# Patient Record
Sex: Male | Born: 1935 | Race: White | Hispanic: No | Marital: Married | Smoking: Former smoker
Health system: Southern US, Community
[De-identification: ages and names within clinical notes are randomized; demographics above are authoritative.]

## PROBLEM LIST (undated history)

## (undated) DIAGNOSIS — H9190 Unspecified hearing loss, unspecified ear: Secondary | ICD-10-CM

## (undated) DIAGNOSIS — C649 Malignant neoplasm of unspecified kidney, except renal pelvis: Secondary | ICD-10-CM

## (undated) DIAGNOSIS — I2699 Other pulmonary embolism without acute cor pulmonale: Secondary | ICD-10-CM

## (undated) DIAGNOSIS — K219 Gastro-esophageal reflux disease without esophagitis: Secondary | ICD-10-CM

## (undated) DIAGNOSIS — M1990 Unspecified osteoarthritis, unspecified site: Secondary | ICD-10-CM

## (undated) DIAGNOSIS — I700 Atherosclerosis of aorta: Secondary | ICD-10-CM

## (undated) DIAGNOSIS — I340 Nonrheumatic mitral (valve) insufficiency: Secondary | ICD-10-CM

## (undated) DIAGNOSIS — E785 Hyperlipidemia, unspecified: Secondary | ICD-10-CM

## (undated) DIAGNOSIS — R011 Cardiac murmur, unspecified: Secondary | ICD-10-CM

## (undated) DIAGNOSIS — J189 Pneumonia, unspecified organism: Secondary | ICD-10-CM

## (undated) DIAGNOSIS — R609 Edema, unspecified: Secondary | ICD-10-CM

## (undated) DIAGNOSIS — I10 Essential (primary) hypertension: Secondary | ICD-10-CM

## (undated) DIAGNOSIS — I4891 Unspecified atrial fibrillation: Secondary | ICD-10-CM

## (undated) DIAGNOSIS — I341 Nonrheumatic mitral (valve) prolapse: Secondary | ICD-10-CM

## (undated) DIAGNOSIS — Z974 Presence of external hearing-aid: Secondary | ICD-10-CM

## (undated) DIAGNOSIS — I38 Endocarditis, valve unspecified: Secondary | ICD-10-CM

## (undated) DIAGNOSIS — A419 Sepsis, unspecified organism: Secondary | ICD-10-CM

## (undated) DIAGNOSIS — D649 Anemia, unspecified: Secondary | ICD-10-CM

## (undated) DIAGNOSIS — I00 Rheumatic fever without heart involvement: Secondary | ICD-10-CM

## (undated) DIAGNOSIS — R600 Localized edema: Secondary | ICD-10-CM

## (undated) DIAGNOSIS — Z87442 Personal history of urinary calculi: Secondary | ICD-10-CM

## (undated) DIAGNOSIS — I509 Heart failure, unspecified: Secondary | ICD-10-CM

## (undated) HISTORY — PX: LITHOTRIPSY: SUR834

## (undated) HISTORY — DX: Other pulmonary embolism without acute cor pulmonale: I26.99

## (undated) HISTORY — PX: TEE WITH CARDIOVERSION: SHX5442

## (undated) HISTORY — DX: Cardiac murmur, unspecified: R01.1

## (undated) HISTORY — DX: Essential (primary) hypertension: I10

## (undated) HISTORY — PX: CARDIAC ELECTROPHYSIOLOGY MAPPING AND ABLATION: SHX1292

## (undated) HISTORY — PX: TONSILLECTOMY: SUR1361

---

## 2005-04-28 ENCOUNTER — Ambulatory Visit: Payer: Self-pay | Admitting: Unknown Physician Specialty

## 2006-12-07 ENCOUNTER — Ambulatory Visit: Payer: Self-pay | Admitting: Internal Medicine

## 2009-03-03 ENCOUNTER — Ambulatory Visit: Payer: Self-pay | Admitting: Cardiology

## 2009-03-03 ENCOUNTER — Ambulatory Visit: Payer: Self-pay | Admitting: Urology

## 2009-03-06 ENCOUNTER — Ambulatory Visit: Payer: Self-pay | Admitting: Urology

## 2011-02-25 ENCOUNTER — Ambulatory Visit: Payer: Self-pay | Admitting: Urology

## 2011-03-04 ENCOUNTER — Ambulatory Visit: Payer: Self-pay | Admitting: Urology

## 2016-04-22 ENCOUNTER — Emergency Department: Payer: Medicare Other

## 2016-04-22 ENCOUNTER — Encounter: Payer: Self-pay | Admitting: *Deleted

## 2016-04-22 ENCOUNTER — Inpatient Hospital Stay
Admission: EM | Admit: 2016-04-22 | Discharge: 2016-04-24 | DRG: 871 | Disposition: A | Discharge status: Other Acute Inpatient Hospital | Payer: Medicare Other | Attending: Internal Medicine | Admitting: Internal Medicine

## 2016-04-22 DIAGNOSIS — J690 Pneumonitis due to inhalation of food and vomit: Secondary | ICD-10-CM

## 2016-04-22 DIAGNOSIS — A419 Sepsis, unspecified organism: Secondary | ICD-10-CM | POA: Diagnosis not present

## 2016-04-22 DIAGNOSIS — R000 Tachycardia, unspecified: Secondary | ICD-10-CM | POA: Diagnosis present

## 2016-04-22 DIAGNOSIS — E785 Hyperlipidemia, unspecified: Secondary | ICD-10-CM | POA: Diagnosis present

## 2016-04-22 DIAGNOSIS — N4889 Other specified disorders of penis: Secondary | ICD-10-CM | POA: Diagnosis not present

## 2016-04-22 DIAGNOSIS — R7881 Bacteremia: Secondary | ICD-10-CM

## 2016-04-22 DIAGNOSIS — M25551 Pain in right hip: Secondary | ICD-10-CM | POA: Diagnosis present

## 2016-04-22 DIAGNOSIS — M4622 Osteomyelitis of vertebra, cervical region: Secondary | ICD-10-CM | POA: Diagnosis present

## 2016-04-22 DIAGNOSIS — M5431 Sciatica, right side: Secondary | ICD-10-CM | POA: Diagnosis present

## 2016-04-22 DIAGNOSIS — J189 Pneumonia, unspecified organism: Secondary | ICD-10-CM | POA: Diagnosis present

## 2016-04-22 DIAGNOSIS — L0291 Cutaneous abscess, unspecified: Secondary | ICD-10-CM | POA: Diagnosis present

## 2016-04-22 DIAGNOSIS — M436 Torticollis: Secondary | ICD-10-CM | POA: Diagnosis not present

## 2016-04-22 DIAGNOSIS — R51 Headache: Secondary | ICD-10-CM | POA: Diagnosis present

## 2016-04-22 DIAGNOSIS — Z880 Allergy status to penicillin: Secondary | ICD-10-CM | POA: Diagnosis not present

## 2016-04-22 DIAGNOSIS — B9561 Methicillin susceptible Staphylococcus aureus infection as the cause of diseases classified elsewhere: Secondary | ICD-10-CM | POA: Diagnosis present

## 2016-04-22 DIAGNOSIS — I340 Nonrheumatic mitral (valve) insufficiency: Secondary | ICD-10-CM | POA: Diagnosis present

## 2016-04-22 DIAGNOSIS — R509 Fever, unspecified: Secondary | ICD-10-CM

## 2016-04-22 DIAGNOSIS — G061 Intraspinal abscess and granuloma: Secondary | ICD-10-CM | POA: Diagnosis present

## 2016-04-22 DIAGNOSIS — M542 Cervicalgia: Secondary | ICD-10-CM

## 2016-04-22 HISTORY — DX: Hyperlipidemia, unspecified: E78.5

## 2016-04-22 LAB — CBC WITH DIFFERENTIAL/PLATELET
BASOS ABS: 0 10*3/uL (ref 0–0.1)
BASOS PCT: 0 %
Eosinophils Absolute: 0 10*3/uL (ref 0–0.7)
Eosinophils Relative: 0 %
HEMATOCRIT: 36.1 % — AB (ref 40.0–52.0)
HEMOGLOBIN: 12.5 g/dL — AB (ref 13.0–18.0)
Lymphocytes Relative: 6 %
Lymphs Abs: 1.1 10*3/uL (ref 1.0–3.6)
MCH: 33.1 pg (ref 26.0–34.0)
MCHC: 34.6 g/dL (ref 32.0–36.0)
MCV: 95.6 fL (ref 80.0–100.0)
Monocytes Absolute: 0.9 10*3/uL (ref 0.2–1.0)
Monocytes Relative: 5 %
NEUTROS ABS: 17.5 10*3/uL — AB (ref 1.4–6.5)
NEUTROS PCT: 89 %
Platelets: 186 10*3/uL (ref 150–440)
RBC: 3.77 MIL/uL — AB (ref 4.40–5.90)
RDW: 14.1 % (ref 11.5–14.5)
WBC: 19.5 10*3/uL — AB (ref 3.8–10.6)

## 2016-04-22 LAB — BASIC METABOLIC PANEL
ANION GAP: 6 (ref 5–15)
BUN: 26 mg/dL — ABNORMAL HIGH (ref 6–20)
CALCIUM: 8.9 mg/dL (ref 8.9–10.3)
CO2: 27 mmol/L (ref 22–32)
Chloride: 103 mmol/L (ref 101–111)
Creatinine, Ser: 0.87 mg/dL (ref 0.61–1.24)
Glucose, Bld: 147 mg/dL — ABNORMAL HIGH (ref 65–99)
POTASSIUM: 4.2 mmol/L (ref 3.5–5.1)
Sodium: 136 mmol/L (ref 135–145)

## 2016-04-22 LAB — PROCALCITONIN: Procalcitonin: 0.17 ng/mL

## 2016-04-22 LAB — GLUCOSE, CAPILLARY
GLUCOSE-CAPILLARY: 116 mg/dL — AB (ref 65–99)
Glucose-Capillary: 155 mg/dL — ABNORMAL HIGH (ref 65–99)

## 2016-04-22 LAB — LACTIC ACID, PLASMA
LACTIC ACID, VENOUS: 1.9 mmol/L (ref 0.5–1.9)
Lactic Acid, Venous: 1.1 mmol/L (ref 0.5–1.9)

## 2016-04-22 MED ORDER — PNEUMOCOCCAL VAC POLYVALENT 25 MCG/0.5ML IJ INJ: 0.5000 mL | INJECTION | INTRAMUSCULAR | Status: DC

## 2016-04-22 MED ORDER — PRAVASTATIN SODIUM 40 MG PO TABS
40.0000 mg | ORAL_TABLET | Freq: Every day | ORAL | Status: DC
  Administered 2016-04-22 – 2016-04-24 (×3): 40 mg via ORAL
  Filled 2016-04-22 (×3): qty 1

## 2016-04-22 MED ORDER — MORPHINE SULFATE (PF) 2 MG/ML IV SOLN
2.0000 mg | INTRAVENOUS | Status: DC | PRN
  Administered 2016-04-22 – 2016-04-24 (×5): 2 mg via INTRAVENOUS
  Filled 2016-04-22 (×5): qty 1

## 2016-04-22 MED ORDER — SODIUM CHLORIDE 0.9 % IV BOLUS (SEPSIS)
500.0000 mL | Freq: Once | INTRAVENOUS | Status: AC
  Administered 2016-04-22: 500 mL via INTRAVENOUS

## 2016-04-22 MED ORDER — CEFTRIAXONE SODIUM 1 G IJ SOLR
1.0000 g | Freq: Once | INTRAMUSCULAR | Status: DC
  Administered 2016-04-22: 1 g via INTRAVENOUS
  Filled 2016-04-22: qty 10

## 2016-04-22 MED ORDER — SODIUM CHLORIDE 0.9% FLUSH
3.0000 mL | Freq: Two times a day (BID) | INTRAVENOUS | Status: DC
  Administered 2016-04-22 – 2016-04-24 (×5): 3 mL via INTRAVENOUS

## 2016-04-22 MED ORDER — ACETAMINOPHEN 325 MG PO TABS: 650.0000 mg | ORAL_TABLET | Freq: Four times a day (QID) | ORAL | Status: DC | PRN

## 2016-04-22 MED ORDER — IPRATROPIUM-ALBUTEROL 0.5-2.5 (3) MG/3ML IN SOLN
3.0000 mL | RESPIRATORY_TRACT | Status: DC
  Administered 2016-04-22 – 2016-04-23 (×3): 3 mL via RESPIRATORY_TRACT
  Filled 2016-04-22 (×3): qty 3

## 2016-04-22 MED ORDER — HYDROMORPHONE HCL 1 MG/ML IJ SOLN
1.0000 mg | Freq: Once | INTRAMUSCULAR | Status: AC
  Administered 2016-04-22: 1 mg via INTRAVENOUS
  Filled 2016-04-22: qty 1

## 2016-04-22 MED ORDER — DEXTROSE 5 % IV SOLN
500.0000 mg | Freq: Once | INTRAVENOUS | Status: DC
  Filled 2016-04-22: qty 500

## 2016-04-22 MED ORDER — SODIUM CHLORIDE 0.9 % IV BOLUS (SEPSIS)
1000.0000 mL | Freq: Once | INTRAVENOUS | Status: AC
  Administered 2016-04-22: 1000 mL via INTRAVENOUS

## 2016-04-22 MED ORDER — ONDANSETRON HCL 4 MG PO TABS: 4.0000 mg | ORAL_TABLET | Freq: Four times a day (QID) | ORAL | Status: DC | PRN

## 2016-04-22 MED ORDER — INFLUENZA VAC SPLIT QUAD 0.5 ML IM SUSY: 0.5000 mL | PREFILLED_SYRINGE | INTRAMUSCULAR | Status: DC

## 2016-04-22 MED ORDER — ENOXAPARIN SODIUM 40 MG/0.4ML ~~LOC~~ SOLN
40.0000 mg | SUBCUTANEOUS | Status: DC
  Administered 2016-04-22 – 2016-04-24 (×3): 40 mg via SUBCUTANEOUS
  Filled 2016-04-22 (×3): qty 0.4

## 2016-04-22 MED ORDER — LORAZEPAM 2 MG/ML IJ SOLN
1.0000 mg | Freq: Once | INTRAMUSCULAR | Status: AC
  Administered 2016-04-22: 1 mg via INTRAVENOUS
  Filled 2016-04-22: qty 1

## 2016-04-22 MED ORDER — ONDANSETRON HCL 4 MG/2ML IJ SOLN: 4.0000 mg | Freq: Four times a day (QID) | INTRAMUSCULAR | Status: DC | PRN

## 2016-04-22 MED ORDER — LEVOFLOXACIN IN D5W 750 MG/150ML IV SOLN
750.0000 mg | INTRAVENOUS | Status: DC
  Administered 2016-04-22: 750 mg via INTRAVENOUS
  Filled 2016-04-22: qty 150

## 2016-04-22 MED ORDER — SODIUM CHLORIDE 0.9 % IV SOLN
INTRAVENOUS | Status: DC
  Administered 2016-04-23 – 2016-04-24 (×2): via INTRAVENOUS

## 2016-04-22 MED ORDER — ACETAMINOPHEN 650 MG RE SUPP: 650.0000 mg | Freq: Four times a day (QID) | RECTAL | Status: DC | PRN

## 2016-04-22 MED ORDER — OXYCODONE HCL 5 MG PO TABS
5.0000 mg | ORAL_TABLET | ORAL | Status: DC | PRN
  Administered 2016-04-22 – 2016-04-24 (×5): 5 mg via ORAL
  Filled 2016-04-22 (×5): qty 1

## 2016-04-22 MED ORDER — BUPIVACAINE HCL (PF) 0.5 % IJ SOLN
20.0000 mL | Freq: Once | INTRAMUSCULAR | Status: AC
  Administered 2016-04-22: 20 mL
  Filled 2016-04-22: qty 30

## 2016-04-22 NOTE — ED Notes (Signed)
Patient returned from [place]. Placed back on monitor. Will continue to monitor. Gave hearing aid to son.

## 2016-04-22 NOTE — Progress Notes (Signed)
Pt AAOx4 pt has no c/o pain pt stable and being transferred to 2A for continued Care.Report given to [facility] ,RN

## 2016-04-22 NOTE — ED Provider Notes (Signed)
Time Seen: Approximately 0928  I have reviewed the triage notes  Chief Complaint: Neck Pain   History of Present Illness: Marcus Kemp is a 82 y.o. male *who presents with a 5-6 day history of left-sided neck pain. Patient states that he's had difficulty with sleep and moving his head in either direction. Patient's taken some occasional Motrin at home without significant relief. The patient's not aware of any fever, chills, significant headache. He points to a single spot basically at the left occiput region as the source of swelling and focal pain. He denies any rash or any history of trauma other than he was moving a bucket last week and feels like he "" pulled his neck. He states he's also had some right hip pain from a twisting mechanism but states no direct trauma or fall and has been ambulatory without difficulty.   Past Medical History:  Diagnosis Date  . Hyperlipidemia     Patient Active Problem List   Diagnosis Date Noted  . Sepsis ([place]) 04/22/2016  . Community acquired pneumonia 04/22/2016    History reviewed. No pertinent surgical history.  History reviewed. No pertinent surgical history.    Allergies:  Penicillins  Family History: Family History  Problem Relation Age of Onset  . Hypertension Other     Social History: Social History  Substance Use Topics  . Smoking status: Never Smoker  . Smokeless tobacco: Never Used  . Alcohol use No     Review of Systems:   10 point review of systems was performed and was otherwise negative:  Constitutional: No fever Eyes: No visual disturbances ENT: No sore throat, ear pain Cardiac: No chest pain Respiratory: No shortness of breath, wheezing, or stridor Abdomen: No abdominal pain, no vomiting, No diarrhea Endocrine: No weight loss, No night sweats Extremities: No peripheral edema, cyanosis Skin: No rashes, easy bruising Neurologic: No focal weakness, trouble with speech or swollowing Urologic: No  dysuria, Hematuria, or urinary frequency   Physical Exam:  ED Triage Vitals  Enc Vitals Group     BP 04/22/16 1030 (!) 83/64     Pulse Rate 04/22/16 0902 (!) 131     Resp 04/22/16 0902 16     Temp 04/22/16 0902 97.7 F (36.5 C)     Temp Source 04/22/16 0902 Oral     SpO2 04/22/16 0902 98 %     Weight 04/22/16 0902 150 lb (68 kg)     Height 04/22/16 0902 5\' 6"  (1.676 m)     Head Circumference --      Peak Flow --      Pain Score 04/22/16 0902 6     Pain Loc --      Pain Edu? --      Excl. in [place]? --     General: Awake , Alert , and Oriented times 3; GCS 15 Head: Normal cephalic , atraumatic Eyes: Pupils equal , round, reactive to light Nose/Throat: No nasal drainage, patent upper airway without erythema or exudate.  Neck: Patient has point tenderness basically at the occiput region with limited range of motion secondary to pain. No obvious meningeal signs and no palpable abscesses.  Lungs: Clear to ascultation without wheezes , rhonchi, or rales Heart: Tachycardia, regular rhythm without murmurs , gallops , or rubs Abdomen: Soft, non tender without rebound, guarding , or rigidity; bowel sounds positive and symmetric in all 4 quadrants. No organomegaly .        Extremities: 2 plus symmetric pulses. No  edema, clubbing or cyanosis Neurologic: normal ambulation, Motor symmetric without deficits, sensory intact Skin: warm, dry, no rashes   Labs:   All laboratory work was reviewed including any pertinent negatives or positives listed below:  Labs Reviewed  BASIC METABOLIC PANEL - Abnormal; Notable for the following:       Result Value   Glucose, Bld 147 (*)    BUN 26 (*)    All other components within normal limits  CBC WITH DIFFERENTIAL/PLATELET - Abnormal; Notable for the following:    WBC 19.5 (*)    RBC 3.77 (*)    Hemoglobin 12.5 (*)    HCT 36.1 (*)    Neutro Abs 17.5 (*)    All other components within normal limits  CULTURE, BLOOD (ROUTINE X 2)  CULTURE, BLOOD  (ROUTINE X 2)  LACTIC ACID, PLASMA  LACTIC ACID, PLASMA  URINALYSIS COMPLETEWITH MICROSCOPIC ([facility] ONLY)  CBC  CREATININE, SERUM  PROCALCITONIN  Of significance is the patient's elevated white blood cell count  EKG:  ED ECG REPORT I, Daymon Larsen, the attending physician, personally viewed and interpreted this ECG.  Date: 04/22/2016 EKG Time: 0859 Rate:130 Rhythm: Sinus tachycardia without any acute ischemic changes QRS Axis: normal Intervals: normal ST/T Wave abnormalities: normal Conduction Disturbances: none Narrative Interpretation: unremarkable    Radiology:  "Dg Chest 2 View  Result Date: 04/22/2016 CLINICAL DATA:  Injured lifting last week now with neck pain EXAM: CHEST  2 VIEW COMPARISON:  None. FINDINGS: The lungs are not optimally aerated. There is minimal patchy opacity at the left lung base which could be posteriorly positioned suspicious for pneumonia. Some of the opacity on the lateral view is most likely due to degenerative spurring in the lower thoracic spine. No effusion is seen. The heart is mildly enlarged. The bones are osteopenic and there are diffuse degenerative changes throughout the thoracic spine. IMPRESSION: 1. Cannot exclude a small patchy area of pneumonia within the left lower lobe. 2. Poor inspiration. Electronically Signed   By: Ivar Drape M.D.   On: 04/22/2016 14:42   Ct Cervical Spine Wo Contrast  Result Date: 04/22/2016 CLINICAL DATA:  Neck pain after moving a bucket last week EXAM: CT CERVICAL SPINE WITHOUT CONTRAST TECHNIQUE: Multidetector CT imaging of the cervical spine was performed without intravenous contrast. Multiplanar CT image reconstructions were also generated. COMPARISON:  None. FINDINGS: Alignment: Sagittal images shows normal alignment. Skull base and vertebrae: No acute fracture or subluxation. There are degenerative changes C1-C2 articulation. There is mild posterior spurring at C5-C6 level. Mild anterior spurring at C5-C6 and  C6-C7 level. Moderate posterior spurring at C6-C7 level. Minimal anterior spurring at C3-C4 level. Soft tissues and spinal canal: No prevertebral soft tissue swelling. There is mild spinal canal stenosis at C5-C6 level due to posterior spurring. Moderate stenosis spinal canal left lateral recess due to posterior spurring at C6-C7 level. Disc levels: There is mild disc space flattening at C3-C4 level. Mild to moderate disc space flattening at C5-C6 level. Significant disc space flattening with vacuum disc phenomenon at C6-C7 level. Upper chest: There is mild thickening of upper esophageal wall. Gastroesophageal reflux cannot be excluded. There is no pneumothorax in visualized lung apices. Other: There is small upper pneumomediastinum. This may be posttraumatic in nature. Clinical correlation necessary. Please note there is soft tissue air in soft tissue neck just lateral to right TMJ and lateral to right sphenoid. IMPRESSION: 1. No acute fracture or subluxation. Degenerative changes multilevel as described above. Most significant at C5-C6  and C6-C7 level. No prevertebral soft tissue swelling. Cervical airway is patent. There is neck soft tissue air on the right side just lateral to mandible and lateral to right sphenoid. Please see axial image 6. 2. Small upper pneumomediastinum. This may be post traumatic in nature. Clinical correlation is necessary. 3. Mild thickening of upper esophageal wall. Gastroesophageal reflux cannot be excluded. Clinical correlation is necessary. Electronically Signed   By: Lahoma Crocker M.D.   On: 04/22/2016 10:38  "   I personally reviewed the radiologic studies    ED Course:  Patient's stay here showed his pain is still be reproducible left-sided neck and I felt this was more of a torticollis 10 so much meningitis or cervical discitis. Patient had injection of bupivacaine locally to see if that offers relief in the area of swelling and point tenderness. He does state significant  relief and was also given some IV fluids. The patient remained tachycardic and had episodes of low blood pressure and I cannot ascertain the exact source. His white blood cell count came back with his laboratory work to be elevated. Initial and repeat temperature check show him to be afebrile. The patient was initiated on a septic workup with chest x-ray, lactic acid, etc. Blood cultures 2 are pending. Patient was found on chest x-ray to have some left lower lobe pneumonia and was initiated on IV antibiotics here in emergency department. Her pressure has come up with fluids is still persistently tachycardic with a heart rate of 124. Clinical Course     Assessment:  Torticollis Community-acquired pneumonia      Plan:  Inpatient management            Daymon Larsen, MD 04/22/16 [phone]

## 2016-04-22 NOTE — H&P (Addendum)
[place] [facility] [place] NAME: Marcus Kemp    MR#:  [id number]  DATE OF BIRTH:  01-Jul-1925   DATE OF ADMISSION:  04/22/2016  PRIMARY CARE PHYSICIAN: No primary care provider on file.   REQUESTING/REFERRING PHYSICIAN: Marcelene Butte  CHIEF COMPLAINT:   Chief Complaint  Patient presents with  . Neck Pain    HISTORY OF PRESENT ILLNESS:  Marcus Kemp  is a 82 y.o. male with a known history of Hyperlipidemia unspecified, mitral valve prolapse who is presenting with neck pain.  6 day duration of neck pain after picking up a heavy box denies any numbness or tingling. He describes the pain 6-7/10 no worsening or relieving factors nonradiating. He also describes having cough nonproductive for the same general duration denies fevers chills shortness of breath. Upon presentation the hospital notably tachycardic, actually meeting septic criteria workup reveals possible pneumonia.  PAST MEDICAL HISTORY:   Past Medical History:  Diagnosis Date  . Hyperlipidemia     PAST SURGICAL HISTORY:  History reviewed. No pertinent surgical history.  SOCIAL HISTORY:   Social History  Substance Use Topics  . Smoking status: Never Smoker  . Smokeless tobacco: Never Used  . Alcohol use No    FAMILY HISTORY:   Family History  Problem Relation Age of Onset  . Hypertension Other     DRUG ALLERGIES:   Allergies  Allergen Reactions  . Penicillins Anaphylaxis    REVIEW OF SYSTEMS:  REVIEW OF SYSTEMS:  CONSTITUTIONAL: Denies fevers, chills, fatigue, weakness.  EYES: Denies blurred vision, double vision, or eye pain.  EARS, NOSE, THROAT: Denies tinnitus, ear pain, hearing loss.  RESPIRATORY: Positive cough, denies shortness of breath, wheezing  CARDIOVASCULAR: Denies chest pain, palpitations, edema.  GASTROINTESTINAL: Denies nausea, vomiting, diarrhea, abdominal pain.  GENITOURINARY: Denies dysuria, hematuria.  ENDOCRINE: Denies nocturia or thyroid  problems. HEMATOLOGIC AND LYMPHATIC: Denies easy bruising or bleeding.  SKIN: Denies rash or lesions.  MUSCULOSKELETAL: Positive pain in neck, denies back, shoulder, knees, hips, or further arthritic symptoms.  NEUROLOGIC: Denies paralysis, paresthesias.  PSYCHIATRIC: Denies anxiety or depressive symptoms. Otherwise full review of systems performed by me is negative.   MEDICATIONS AT HOME:   Prior to Admission medications   Not on File      VITAL SIGNS:  Blood pressure (!) 87/69, pulse (!) 126, temperature 97.6 F (36.4 C), temperature source Oral, resp. rate 11, height 5\' 6"  (1.676 m), weight 68 kg (150 lb), SpO2 96 %.  PHYSICAL EXAMINATION:  VITAL SIGNS: Vitals:   04/22/16 1304 04/22/16 1330  BP:  (!) 87/69  Pulse:  (!) 126  Resp:  11  Temp: 97.6 F (36.4 C)    GENERAL:82 y.o.male currently in no acute distress. Hard of hearing HEAD: Normocephalic, atraumatic.  EYES: Pupils equal, round, reactive to light. Extraocular muscles intact. No scleral icterus.  MOUTH: Moist mucosal membrane. Dentition intact. No abscess noted.  EAR, NOSE, THROAT: Clear without exudates. No external lesions.  NECK: Supple. No thyromegaly. No nodules. No JVD.  PULMONARY: Rhonchi left base without wheezei. No use of accessory muscles, Good respiratory effort. good air entry bilaterally CHEST: Nontender to palpation.  CARDIOVASCULAR: S1 and S2. Tachycardic 3/6 murmurs, rubs, or gallops. No edema. Pedal pulses 2+ bilaterally.  GASTROINTESTINAL: Soft, nontender, nondistended. No masses. Positive bowel sounds. No hepatosplenomegaly.  MUSCULOSKELETAL: No swelling, clubbing, or edema. Range of motion full in all extremities.  NEUROLOGIC: Cranial nerves II through XII are intact. No gross focal neurological deficits.  Sensation intact. Reflexes intact.  SKIN: No ulceration, lesions, rashes, or cyanosis. Skin warm and dry. Turgor intact.  PSYCHIATRIC: Mood, affect within normal limits. The patient is awake,  alert and oriented x 3. Insight, judgment intact.    LABORATORY PANEL:   CBC  Recent Labs Lab 04/22/16 0925  WBC 19.5*  HGB 12.5*  HCT 36.1*  PLT 186   ------------------------------------------------------------------------------------------------------------------  Chemistries   Recent Labs Lab 04/22/16 0925  NA 136  K 4.2  CL 103  CO2 27  GLUCOSE 147*  BUN 26*  CREATININE 0.87  CALCIUM 8.9   ------------------------------------------------------------------------------------------------------------------  Cardiac Enzymes No results for input(s): TROPONINI in the last 168 hours. ------------------------------------------------------------------------------------------------------------------  RADIOLOGY:  Dg Chest 2 View  Result Date: 04/22/2016 CLINICAL DATA:  Injured lifting last week now with neck pain EXAM: CHEST  2 VIEW COMPARISON:  None. FINDINGS: The lungs are not optimally aerated. There is minimal patchy opacity at the left lung base which could be posteriorly positioned suspicious for pneumonia. Some of the opacity on the lateral view is most likely due to degenerative spurring in the lower thoracic spine. No effusion is seen. The heart is mildly enlarged. The bones are osteopenic and there are diffuse degenerative changes throughout the thoracic spine. IMPRESSION: 1. Cannot exclude a small patchy area of pneumonia within the left lower lobe. 2. Poor inspiration. Electronically Signed   By: Ivar Drape M.D.   On: 04/22/2016 14:42   Ct Cervical Spine Wo Contrast  Result Date: 04/22/2016 CLINICAL DATA:  Neck pain after moving a bucket last week EXAM: CT CERVICAL SPINE WITHOUT CONTRAST TECHNIQUE: Multidetector CT imaging of the cervical spine was performed without intravenous contrast. Multiplanar CT image reconstructions were also generated. COMPARISON:  None. FINDINGS: Alignment: Sagittal images shows normal alignment. Skull base and vertebrae: No acute fracture  or subluxation. There are degenerative changes C1-C2 articulation. There is mild posterior spurring at C5-C6 level. Mild anterior spurring at C5-C6 and C6-C7 level. Moderate posterior spurring at C6-C7 level. Minimal anterior spurring at C3-C4 level. Soft tissues and spinal canal: No prevertebral soft tissue swelling. There is mild spinal canal stenosis at C5-C6 level due to posterior spurring. Moderate stenosis spinal canal left lateral recess due to posterior spurring at C6-C7 level. Disc levels: There is mild disc space flattening at C3-C4 level. Mild to moderate disc space flattening at C5-C6 level. Significant disc space flattening with vacuum disc phenomenon at C6-C7 level. Upper chest: There is mild thickening of upper esophageal wall. Gastroesophageal reflux cannot be excluded. There is no pneumothorax in visualized lung apices. Other: There is small upper pneumomediastinum. This may be posttraumatic in nature. Clinical correlation necessary. Please note there is soft tissue air in soft tissue neck just lateral to right TMJ and lateral to right sphenoid. IMPRESSION: 1. No acute fracture or subluxation. Degenerative changes multilevel as described above. Most significant at C5-C6 and C6-C7 level. No prevertebral soft tissue swelling. Cervical airway is patent. There is neck soft tissue air on the right side just lateral to mandible and lateral to right sphenoid. Please see axial image 6. 2. Small upper pneumomediastinum. This may be post traumatic in nature. Clinical correlation is necessary. 3. Mild thickening of upper esophageal wall. Gastroesophageal reflux cannot be excluded. Clinical correlation is necessary. Electronically Signed   By: Lahoma Crocker M.D.   On: 04/22/2016 10:38    EKG:   Orders placed or performed during the hospital encounter of 04/22/16  . EKG 12-Lead  . EKG 12-Lead  IMPRESSION AND PLAN:   82 year old Caucasian history of hyperlipidemia unspecified presenting with neck pain  meeting septic criteria  1.Sepsis, meeting septic criteria by tachycardia, leukocytosis present on arrival. Source may require pneumonia Code sepsis initiated. Panculture. Broad-spectrum antibiotics including Levaquin and taper antibiotics when culture data returns.  Ordered 30 mL/kg IV fluid bolus. Continue IV fluid hydration to keep mean arterial pressure greater than 65. may require pressor therapy if blood pressure worsens. We will repeat lactic acid if the initial is greater than 2.2. , Provide DuoNeb treatments, supplemental oxygen as required to keep O2 saturation greater than 92% Given blood pressure being marginal admit to stepdown unit  2. Cervalgia: Supportive care, no acute findings on imaging, can add Flexeril if no improvement 3. Hyper Lipidemia unspecified: Statin therapy 4. Venous thrombus embolism prophylactic: Lovenox      All the records are reviewed and case discussed with ED provider. Management plans discussed with the patient, family and they are in agreement.  CODE STATUS: Full  TOTAL TIME TAKING CARE OF THIS PATIENT: 33 critical minutes.    Briunna Leicht,  Karenann Cai.D on 04/22/2016 at 3:26 PM  Between 7am to 6pm - Pager - [phone]  After 6pm: House Pager: [phone]  [facility]  Office  [phone]  CC: Primary care physician; No primary care provider on file.

## 2016-04-22 NOTE — ED Triage Notes (Signed)
Pt reports moving a bucket last week, pulled neck, pt complains of neck pain with a lump, pt is unable to sleep or lay down

## 2016-04-22 NOTE — ED Notes (Signed)
Patient's son Merry Proud is leaving for about an hour. Number to reach him is [phone].

## 2016-04-22 NOTE — Progress Notes (Signed)
Pharmacy Antibiotic Note  Marcus Kemp is a 82 y.o. male admitted on 04/22/2016 with pneumonia.  Pharmacy has been consulted for levofloxacin dosing.  Patient received a dose of ceftriaxone in the ED. Per discussion with MD - antibiotics will be changed from azithromycin/ceftriaxone to levofloxacin due to penicillin allergy.  Plan: Levofloxacin 750 mg IV q24h  Height: 5\' 6"  (167.6 cm) Weight: 150 lb (68 kg) IBW/kg (Calculated) : 63.8  Temp (24hrs), Avg:97.7 F (36.5 C), Min:97.6 F (36.4 C), Max:97.7 F (36.5 C)   Recent Labs Lab 04/22/16 0925 04/22/16 1413  WBC 19.5*  --   CREATININE 0.87  --   LATICACIDVEN  --  1.1    Estimated Creatinine Clearance: 53 mL/min (by C-G formula based on SCr of 0.87 mg/dL).    Allergies  Allergen Reactions  . Penicillins Anaphylaxis   Antimicrobials this admission: levofloxacin 9/28 >>  Ceftriaxone one time dose in ED 9/28  Dose adjustments this admission:  Microbiology results: 9/28 BCx: Sent  Thank you for allowing pharmacy to be a part of this patient's care.  Lenis Noon, PharmD, BCPS Clinical Pharmacist 04/22/2016 3:41 PM

## 2016-04-23 ENCOUNTER — Inpatient Hospital Stay: Admit: 2016-04-23 | Payer: Medicare Other

## 2016-04-23 ENCOUNTER — Inpatient Hospital Stay
Admit: 2016-04-23 | Discharge: 2016-04-23 | Disposition: A | Discharge status: Home / Self Care | Payer: Medicare Other | Attending: Specialist | Admitting: Specialist

## 2016-04-23 LAB — CBC
HEMATOCRIT: 34.5 % — AB (ref 40.0–52.0)
HEMOGLOBIN: 11.6 g/dL — AB (ref 13.0–18.0)
MCH: 32.9 pg (ref 26.0–34.0)
MCHC: 33.7 g/dL (ref 32.0–36.0)
MCV: 97.7 fL (ref 80.0–100.0)
Platelets: 194 10*3/uL (ref 150–440)
RBC: 3.53 MIL/uL — ABNORMAL LOW (ref 4.40–5.90)
RDW: 14.1 % (ref 11.5–14.5)
WBC: 18.3 10*3/uL — ABNORMAL HIGH (ref 3.8–10.6)

## 2016-04-23 LAB — BASIC METABOLIC PANEL
ANION GAP: 6 (ref 5–15)
BUN: 27 mg/dL — ABNORMAL HIGH (ref 6–20)
CHLORIDE: 104 mmol/L (ref 101–111)
CO2: 26 mmol/L (ref 22–32)
Calcium: 8.5 mg/dL — ABNORMAL LOW (ref 8.9–10.3)
Creatinine, Ser: 0.8 mg/dL (ref 0.61–1.24)
GFR calc Af Amer: >60 mL/min (ref >60)
GFR calc non Af Amer: >60 mL/min (ref >60)
GLUCOSE: 137 mg/dL — AB (ref 65–99)
POTASSIUM: 4.3 mmol/L (ref 3.5–5.1)
Sodium: 136 mmol/L (ref 135–145)

## 2016-04-23 LAB — BLOOD CULTURE ID PANEL (REFLEXED)
Acinetobacter baumannii: NOT DETECTED
CANDIDA GLABRATA: NOT DETECTED
CANDIDA TROPICALIS: NOT DETECTED
Candida albicans: NOT DETECTED
Candida krusei: NOT DETECTED
Candida parapsilosis: NOT DETECTED
ENTEROBACTER CLOACAE COMPLEX: NOT DETECTED
Enterobacteriaceae species: NOT DETECTED
Enterococcus species: NOT DETECTED
Escherichia coli: NOT DETECTED
HAEMOPHILUS INFLUENZAE: NOT DETECTED
KLEBSIELLA PNEUMONIAE: NOT DETECTED
Klebsiella oxytoca: NOT DETECTED
Listeria monocytogenes: NOT DETECTED
Methicillin resistance: NOT DETECTED
NEISSERIA MENINGITIDIS: NOT DETECTED
PROTEUS SPECIES: NOT DETECTED
Pseudomonas aeruginosa: NOT DETECTED
STAPHYLOCOCCUS SPECIES: DETECTED — AB
STREPTOCOCCUS SPECIES: NOT DETECTED
Serratia marcescens: NOT DETECTED
Staphylococcus aureus (BCID): DETECTED — AB
Streptococcus agalactiae: NOT DETECTED
Streptococcus pneumoniae: NOT DETECTED
Streptococcus pyogenes: NOT DETECTED

## 2016-04-23 LAB — SEDIMENTATION RATE: SED RATE: 65 mm/h — AB (ref 0–20)

## 2016-04-23 LAB — ECHOCARDIOGRAM COMPLETE
Height: 66 in
WEIGHTICAEL: 2624 [oz_av]

## 2016-04-23 LAB — C-REACTIVE PROTEIN: CRP: 19 mg/dL — AB (ref <1.0)

## 2016-04-23 MED ORDER — SODIUM CHLORIDE 0.9 % IV SOLN
Freq: Once | INTRAVENOUS | Status: AC
  Administered 2016-04-23: 23:00:00 via INTRAVENOUS

## 2016-04-23 MED ORDER — VANCOMYCIN HCL 10 G IV SOLR
1500.0000 mg | Freq: Once | INTRAVENOUS | Status: AC
  Administered 2016-04-23: 1500 mg via INTRAVENOUS
  Filled 2016-04-23 (×2): qty 1500

## 2016-04-23 MED ORDER — IPRATROPIUM-ALBUTEROL 0.5-2.5 (3) MG/3ML IN SOLN: 3.0000 mL | RESPIRATORY_TRACT | Status: DC | PRN

## 2016-04-23 MED ORDER — MENTHOL 3 MG MT LOZG
1.0000 | LOZENGE | OROMUCOSAL | Status: DC | PRN
  Administered 2016-04-23 (×2): 3 mg via ORAL
  Filled 2016-04-23: qty 9

## 2016-04-23 MED ORDER — LEVOFLOXACIN IN D5W 750 MG/150ML IV SOLN
750.0000 mg | INTRAVENOUS | Status: DC
  Administered 2016-04-23 – 2016-04-24 (×2): 750 mg via INTRAVENOUS
  Filled 2016-04-23 (×2): qty 150

## 2016-04-23 MED ORDER — CEFAZOLIN SODIUM-DEXTROSE 2-4 GM/100ML-% IV SOLN
2.0000 g | Freq: Three times a day (TID) | INTRAVENOUS | Status: DC
  Administered 2016-04-23 – 2016-04-24 (×5): 2 g via INTRAVENOUS
  Filled 2016-04-23 (×9): qty 100

## 2016-04-23 MED ORDER — CYCLOBENZAPRINE HCL 10 MG PO TABS
10.0000 mg | ORAL_TABLET | Freq: Three times a day (TID) | ORAL | Status: DC | PRN
  Administered 2016-04-23 – 2016-04-24 (×4): 10 mg via ORAL
  Filled 2016-04-23 (×4): qty 1

## 2016-04-23 MED ORDER — KETOROLAC TROMETHAMINE 30 MG/ML IJ SOLN
30.0000 mg | Freq: Once | INTRAMUSCULAR | Status: AC
  Administered 2016-04-23: 30 mg via INTRAVENOUS
  Filled 2016-04-23: qty 1

## 2016-04-23 MED ORDER — BENZONATATE 100 MG PO CAPS
200.0000 mg | ORAL_CAPSULE | Freq: Three times a day (TID) | ORAL | Status: DC | PRN
  Administered 2016-04-23 (×2): 200 mg via ORAL
  Filled 2016-04-23 (×2): qty 2

## 2016-04-23 NOTE — Progress Notes (Signed)
[facility] NAME: Marcus Kemp    MR#:  [id number]  DATE OF BIRTH:  [birth year]  SUBJECTIVE:   Pt. Here due to neck pain and also complaining of right Sided sciatica pain. Noted to be septic and blood cultures positive for staph aureus. Family at bedside. No cough, chest pain, fever overnight.  REVIEW OF SYSTEMS:    Review of Systems  Constitutional: Negative for chills and fever.  HENT: Negative for congestion and tinnitus.   Eyes: Negative for blurred vision and double vision.  Respiratory: Negative for cough, shortness of breath and wheezing.   Cardiovascular: Negative for chest pain, orthopnea and PND.  Gastrointestinal: Negative for abdominal pain, diarrhea, nausea and vomiting.  Genitourinary: Negative for dysuria and hematuria.  Musculoskeletal: Positive for neck pain.  Neurological: Positive for weakness. Negative for dizziness, sensory change and focal weakness.  All other systems reviewed and are negative.   Nutrition: Heart Healthy Tolerating Diet: Yes Tolerating PT: Await Eval.     DRUG ALLERGIES:   Allergies  Allergen Reactions  . Penicillins Anaphylaxis    VITALS:  Blood pressure 116/81, pulse (!) 126, temperature 98.3 F (36.8 C), temperature source Oral, resp. rate 19, height _0  (1.676 m), weight 74.4 kg (164 lb), SpO2 100 %.  PHYSICAL EXAMINATION:   Physical Exam  GENERAL:  82 y.o.-year-old patient lying in the bed in some distress due to neck pain.  EYES: Pupils equal, round, reactive to light and accommodation. No scleral icterus. Extraocular muscles intact.  HEENT: Head atraumatic, normocephalic. Oropharynx and nasopharynx clear.  NECK:  Supple, no jugular venous distention. No thyroid enlargement, no tenderness.  LUNGS: Normal breath sounds bilaterally, no wheezing, rales, rhonchi. No use of accessory muscles of respiration.  CARDIOVASCULAR: S1, S2 normal. No murmurs, rubs, or gallops.   ABDOMEN: Soft, nontender, nondistended. Bowel sounds present. No organomegaly or mass.  EXTREMITIES: No cyanosis, clubbing or edema b/l.    NEUROLOGIC: Cranial nerves II through XII are intact. No focal Motor or sensory deficits b/l.   PSYCHIATRIC: The patient is alert and oriented x 3. Good affect SKIN: No obvious rash, lesion, or ulcer.    LABORATORY PANEL:   CBC  Recent Labs Lab 04/23/16 0339  WBC 18.3*  HGB 11.6*  HCT 34.5*  PLT 194   ------------------------------------------------------------------------------------------------------------------  Chemistries   Recent Labs Lab 04/23/16 0339  NA 136  K 4.3  CL 104  CO2 26  GLUCOSE 137*  BUN 27*  CREATININE 0.80  CALCIUM 8.5*   ------------------------------------------------------------------------------------------------------------------  Cardiac Enzymes No results for input(s): TROPONINI in the last 168 hours. ------------------------------------------------------------------------------------------------------------------  RADIOLOGY:  Dg Chest 2 View  Result Date: 04/22/2016 CLINICAL DATA:  Injured lifting last week now with neck pain EXAM: CHEST  2 VIEW COMPARISON:  None. FINDINGS: The lungs are not optimally aerated. There is minimal patchy opacity at the left lung base which could be posteriorly positioned suspicious for pneumonia. Some of the opacity on the lateral view is most likely due to degenerative spurring in the lower thoracic spine. No effusion is seen. The heart is mildly enlarged. The bones are osteopenic and there are diffuse degenerative changes throughout the thoracic spine. IMPRESSION: 1. Cannot exclude a small patchy area of pneumonia within the left lower lobe. 2. Poor inspiration. Electronically Signed   By: Ivar Drape M.D.   On: 04/22/2016 14:42   Ct Cervical Spine Wo Contrast  Result Date: 04/22/2016 CLINICAL DATA:  Neck pain after moving  a bucket last week EXAM: CT CERVICAL SPINE  WITHOUT CONTRAST TECHNIQUE: Multidetector CT imaging of the cervical spine was performed without intravenous contrast. Multiplanar CT image reconstructions were also generated. COMPARISON:  None. FINDINGS: Alignment: Sagittal images shows normal alignment. Skull base and vertebrae: No acute fracture or subluxation. There are degenerative changes C1-C2 articulation. There is mild posterior spurring at C5-C6 level. Mild anterior spurring at C5-C6 and C6-C7 level. Moderate posterior spurring at C6-C7 level. Minimal anterior spurring at C3-C4 level. Soft tissues and spinal canal: No prevertebral soft tissue swelling. There is mild spinal canal stenosis at C5-C6 level due to posterior spurring. Moderate stenosis spinal canal left lateral recess due to posterior spurring at C6-C7 level. Disc levels: There is mild disc space flattening at C3-C4 level. Mild to moderate disc space flattening at C5-C6 level. Significant disc space flattening with vacuum disc phenomenon at C6-C7 level. Upper chest: There is mild thickening of upper esophageal wall. Gastroesophageal reflux cannot be excluded. There is no pneumothorax in visualized lung apices. Other: There is small upper pneumomediastinum. This may be posttraumatic in nature. Clinical correlation necessary. Please note there is soft tissue air in soft tissue neck just lateral to right TMJ and lateral to right sphenoid. IMPRESSION: 1. No acute fracture or subluxation. Degenerative changes multilevel as described above. Most significant at C5-C6 and C6-C7 level. No prevertebral soft tissue swelling. Cervical airway is patent. There is neck soft tissue air on the right side just lateral to mandible and lateral to right sphenoid. Please see axial image 6. 2. Small upper pneumomediastinum. This may be post traumatic in nature. Clinical correlation is necessary. 3. Mild thickening of upper esophageal wall. Gastroesophageal reflux cannot be excluded. Clinical correlation is necessary.  Electronically Signed   By: Lahoma Crocker M.D.   On: 04/22/2016 10:38     ASSESSMENT AND PLAN:   82 year old male with past medical history of Hyperlipidemia who presents to the hospital due to neck pain, right sided sciatica pain and noted to meet sepsis criteria secondary to pneumonia.   1. Sepsis-patient met criteria admission given his tachycardia, elevated lactic acid, chest x-ray findings suggestive pneumonia. -Continue IV Levaquin, vancomycin. -Patient's blood cultures actually positive for staph aureus. Discussed with infectious disease will evaluate patient today. -I will get a two-dimensional echocardiogram to rule out underlying endocarditis.  2. 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 Patient had a CT of the cervical spine which showed degenerative changes in C5-C6 and C6-C7 area. -Continue pain control with oxycodone, will add some Flexeril. Add a heating pad. - follow clinically.  - given one dose of Toradol today to see if it helps.   3. Leukocytosis - due to Sepsis.  - follow w/ IV abx therapy.   4. Hyperlipidemia - cont. Pravachol   All the records are reviewed and case discussed with Care Management/Social Worker. Management plans discussed with the patient, family and they are in agreement.  CODE STATUS: Full Code  DVT Prophylaxis: Lovenox  TOTAL TIME TAKING CARE OF THIS PATIENT: 30 minutes.   POSSIBLE D/C IN 2-3 DAYS, DEPENDING ON CLINICAL CONDITION.   Henreitta Leber M.D on 04/23/2016 at 2:54 PM  Between 7am to 6pm - Pager - [phone]  After 6pm go to [email or web address] - [employer] [facility]  Office  [phone]  CC: Primary care physician; No primary care provider on file.

## 2016-04-23 NOTE — Consult Note (Signed)
[facility] Infectious Disease     Reason for Consult: Staph aureus bacteremia    Referring Physician: Jeronimo Greaves Date of Admission:  04/22/2016   Active Problems:   Sepsis ([place])   Community acquired pneumonia   HPI: Marcus Kemp is a 82 y.o. male admitted with neck pain x 6 days as well as cough. Found to have tachycardia, wbc 19, but afebilre. CXR with possible PNA- started levo and also given ctx. [place] + for MSSA by [employer].  He states about 1.5 weeks ago he developed R hip and LBP pain after lifting the tailgate on his truck. Went to see chiropractor and that is better but then woke up with severe L neck pain. Some fevers and cough as well. Had a chill last night. He has hx of Rheumatic fever and Mod MR but was relatively active. Follows with Dr Saralyn Pilar.    Past Medical History:  Diagnosis Date  . Hyperlipidemia    History reviewed. No pertinent surgical history. Social History  Substance Use Topics  . Smoking status: Never Smoker  . Smokeless tobacco: Never Used  . Alcohol use No   Family History  Problem Relation Age of Onset  . Hypertension Other     Allergies:  Allergies  Allergen Reactions  . Penicillins Anaphylaxis    Current antibiotics: Antibiotics Given (last 72 hours)    Date/Time Action Medication Dose Rate   04/23/16 1031 Given   vancomycin (VANCOCIN) 1,500 mg in sodium chloride 0.9 % 500 mL IVPB 1,500 mg 250 mL/hr      MEDICATIONS: . enoxaparin (LOVENOX) injection  40 mg Subcutaneous Q24H  . Influenza vac split quadrivalent PF  0.5 mL Intramuscular Tomorrow-1000  . levofloxacin (LEVAQUIN) IV  750 mg Intravenous Q24H  . pneumococcal 23 valent vaccine  0.5 mL Intramuscular Tomorrow-1000  . pravastatin  40 mg Oral q1800  . sodium chloride flush  3 mL Intravenous Q12H    Review of Systems - 11 systems reviewed and negative per HPI   OBJECTIVE: Temp:  [97.3 F (36.3 C)-98.3 F (36.8 C)] 98.3 F (36.8 C) (09/29 1113) Pulse Rate:   [75-131] 126 (09/29 1113) Resp:  [12-19] 19 (09/29 1113) BP: (97-139)/(65-111) 116/81 (09/29 1113) SpO2:  [73 %-100 %] 100 % (09/29 1113) Weight:  [68 kg (149 lb 14.6 oz)-74.5 kg (164 lb 3.2 oz)] 74.4 kg (164 lb) (09/29 0500) Physical Exam  Constitutional: He is oriented to person, place, and time. He appears in mod pain, holding neck still HENT: anicteric,  Mouth/Throat: Oropharynx is clear and dry . No oropharyngeal exudate.  Cardiovascular: Normal rate, regular rhythm 3/6 SM Pulmonary/Chest: Effort normal and breath sounds normal. No respiratory distress. He has no wheezes.  Abdominal: Soft. Bowel sounds are normal. He exhibits no distension. There is no tenderness.  Lymphadenopathy: He has no cervical adenopathy.  Neurological: He is alert and oriented to person, place, and time. UE/LE  strength intact Skin: Skin is warm. Quite dry and scaly skin with several old healing scabs.  No stigmata of endocarditis Psychiatric: He has a normal mood and affect. His behavior is normal.   LABS: Results for orders placed or performed during the hospital encounter of 04/22/16 (from the past 48 hour(s))  Basic metabolic panel     Status: Abnormal   Collection Time: 04/22/16  9:25 AM  Result Value Ref Range   Sodium 136 135 - 145 mmol/L   Potassium 4.2 3.5 - 5.1 mmol/L   Chloride 103 101 - 111 mmol/L  CO2 27 22 - 32 mmol/L   Glucose, Bld 147 (H) 65 - 99 mg/dL   BUN 26 (H) 6 - 20 mg/dL   Creatinine, Ser 0.87 0.61 - 1.24 mg/dL   Calcium 8.9 8.9 - 10.3 mg/dL   GFR calc non Af Amer >60 >60 mL/min   GFR calc Af Amer >60 >60 mL/min    Comment: (NOTE) The eGFR has been calculated using the CKD EPI equation. This calculation has not been validated in all clinical situations. eGFR's persistently <60 mL/min signify possible Chronic Kidney Disease.    Anion gap 6 5 - 15  CBC with Differential/Platelet     Status: Abnormal   Collection Time: 04/22/16  9:25 AM  Result Value Ref Range   WBC 19.5 (H)  3.8 - 10.6 K/uL   RBC 3.77 (L) 4.40 - 5.90 MIL/uL   Hemoglobin 12.5 (L) 13.0 - 18.0 g/dL   HCT 36.1 (L) 40.0 - 52.0 %   MCV 95.6 80.0 - 100.0 fL   MCH 33.1 26.0 - 34.0 pg   MCHC 34.6 32.0 - 36.0 g/dL   RDW 14.1 11.5 - 14.5 %   Platelets 186 150 - 440 K/uL   Neutrophils Relative % 89 %   Neutro Abs 17.5 (H) 1.4 - 6.5 K/uL   Lymphocytes Relative 6 %   Lymphs Abs 1.1 1.0 - 3.6 K/uL   Monocytes Relative 5 %   Monocytes Absolute 0.9 0.2 - 1.0 K/uL   Eosinophils Relative 0 %   Eosinophils Absolute 0.0 0 - 0.7 K/uL   Basophils Relative 0 %   Basophils Absolute 0.0 0 - 0.1 K/uL  Procalcitonin     Status: None   Collection Time: 04/22/16  9:25 AM  Result Value Ref Range   Procalcitonin 0.17 ng/mL    Comment:        Interpretation: PCT (Procalcitonin) <= 0.5 ng/mL: Systemic infection (sepsis) is not likely. Local bacterial infection is possible. (NOTE)         ICU PCT Algorithm               Non ICU PCT Algorithm    ----------------------------     ------------------------------         PCT < 0.25 ng/mL                 PCT < 0.1 ng/mL     Stopping of antibiotics            Stopping of antibiotics       strongly encouraged.               strongly encouraged.    ----------------------------     ------------------------------       PCT level decrease by               PCT < 0.25 ng/mL       >= 80% from peak PCT       OR PCT 0.25 - 0.5 ng/mL          Stopping of antibiotics                                             encouraged.     Stopping of antibiotics           encouraged.    ----------------------------     ------------------------------  PCT level decrease by              PCT >= 0.25 ng/mL       < 80% from peak PCT        AND PCT >= 0.5 ng/mL            Continuin g antibiotics                                              encouraged.       Continuing antibiotics            encouraged.    ----------------------------     ------------------------------     PCT level  increase compared          PCT > 0.5 ng/mL         with peak PCT AND          PCT >= 0.5 ng/mL             Escalation of antibiotics                                          strongly encouraged.      Escalation of antibiotics        strongly encouraged.   Lactic acid, plasma     Status: None   Collection Time: 04/22/16  2:13 PM  Result Value Ref Range   Lactic Acid, Venous 1.1 0.5 - 1.9 mmol/L  Culture, blood (Routine X 2) w Reflex to ID Panel     Status: None (Preliminary result)   Collection Time: 04/22/16  2:13 PM  Result Value Ref Range   Specimen Description      BLOOD LEFT ANTECUBITAL Performed at [facility]    Culture  Setup Time      Organism ID to follow [place] AND ANAEROBIC BOTTLES CRITICAL RESULT CALLED TO, READ BACK BY AND VERIFIED WITH: [place] ON 04/23/16 AT 0654 QSD    Culture GRAM POSITIVE COCCI    Report Status PENDING   Blood Culture ID Panel (Reflexed)     Status: Abnormal   Collection Time: 04/22/16  2:13 PM  Result Value Ref Range   Enterococcus species NOT DETECTED NOT DETECTED   Listeria monocytogenes NOT DETECTED NOT DETECTED   Staphylococcus species DETECTED (A) NOT DETECTED    Comment: CRITICAL RESULT CALLED TO, READ BACK BY AND VERIFIED WITH: NATE COOKSON ON 04/23/16 AT 0654 QSD    Staphylococcus aureus DETECTED (A) NOT DETECTED    Comment: CRITICAL RESULT CALLED TO, READ BACK BY AND VERIFIED WITH: NATE COOKSON ON 04/23/16 AT 0654 QSD    Methicillin resistance NOT DETECTED NOT DETECTED   Streptococcus species NOT DETECTED NOT DETECTED   Streptococcus agalactiae NOT DETECTED NOT DETECTED   Streptococcus pneumoniae NOT DETECTED NOT DETECTED   Streptococcus pyogenes NOT DETECTED NOT DETECTED   Acinetobacter baumannii NOT DETECTED NOT DETECTED   Enterobacteriaceae species NOT DETECTED NOT DETECTED   Enterobacter cloacae complex NOT DETECTED NOT  DETECTED   Escherichia coli NOT DETECTED NOT DETECTED   Klebsiella oxytoca NOT DETECTED NOT DETECTED   Klebsiella pneumoniae NOT DETECTED NOT DETECTED   Proteus species  NOT DETECTED NOT DETECTED   Serratia marcescens NOT DETECTED NOT DETECTED   Haemophilus influenzae NOT DETECTED NOT DETECTED   Neisseria meningitidis NOT DETECTED NOT DETECTED   Pseudomonas aeruginosa NOT DETECTED NOT DETECTED   Candida albicans NOT DETECTED NOT DETECTED   Candida glabrata NOT DETECTED NOT DETECTED   Candida krusei NOT DETECTED NOT DETECTED   Candida parapsilosis NOT DETECTED NOT DETECTED   Candida tropicalis NOT DETECTED NOT DETECTED  Culture, blood (Routine X 2) w Reflex to ID Panel     Status: None (Preliminary result)   Collection Time: 04/22/16  2:14 PM  Result Value Ref Range   Specimen Description BLOOD RIGHT ARM    Special Requests BOTTLES DRAWN AEROBIC AND ANAEROBIC 4ML    Culture  Setup Time      GRAM POSITIVE COCCI IN BOTH AEROBIC AND ANAEROBIC BOTTLES   Culture GRAM POSITIVE COCCI    Report Status PENDING   Glucose, capillary     Status: Abnormal   Collection Time: 04/22/16  5:25 PM  Result Value Ref Range   Glucose-Capillary 116 (H) 65 - 99 mg/dL  Lactic acid, plasma     Status: None   Collection Time: 04/22/16  6:44 PM  Result Value Ref Range   Lactic Acid, Venous 1.9 0.5 - 1.9 mmol/L  Glucose, capillary     Status: Abnormal   Collection Time: 04/22/16 10:09 PM  Result Value Ref Range   Glucose-Capillary 155 (H) 65 - 99 mg/dL  Basic metabolic panel     Status: Abnormal   Collection Time: 04/23/16  3:39 AM  Result Value Ref Range   Sodium 136 135 - 145 mmol/L   Potassium 4.3 3.5 - 5.1 mmol/L   Chloride 104 101 - 111 mmol/L   CO2 26 22 - 32 mmol/L   Glucose, Bld 137 (H) 65 - 99 mg/dL   BUN 27 (H) 6 - 20 mg/dL   Creatinine, Ser 0.80 0.61 - 1.24 mg/dL   Calcium 8.5 (L) 8.9 - 10.3 mg/dL   GFR calc non Af Amer >60 >60 mL/min   GFR calc Af Amer >60 >60 mL/min    Comment:  (NOTE) The eGFR has been calculated using the CKD EPI equation. This calculation has not been validated in all clinical situations. eGFR's persistently <60 mL/min signify possible Chronic Kidney Disease.    Anion gap 6 5 - 15  CBC     Status: Abnormal   Collection Time: 04/23/16  3:39 AM  Result Value Ref Range   WBC 18.3 (H) 3.8 - 10.6 K/uL   RBC 3.53 (L) 4.40 - 5.90 MIL/uL   Hemoglobin 11.6 (L) 13.0 - 18.0 g/dL   HCT 34.5 (L) 40.0 - 52.0 %   MCV 97.7 80.0 - 100.0 fL   MCH 32.9 26.0 - 34.0 pg   MCHC 33.7 32.0 - 36.0 g/dL   RDW 14.1 11.5 - 14.5 %   Platelets 194 150 - 440 K/uL  Sedimentation rate     Status: Abnormal   Collection Time: 04/23/16  3:39 AM  Result Value Ref Range   Sed Rate 65 (H) 0 - 20 mm/hr  C-reactive protein     Status: Abnormal   Collection Time: 04/23/16  3:39 AM  Result Value Ref Range   CRP 19.0 (H) <1.0 mg/dL    Comment: Performed at [facility]   No components found for: ESR, C REACTIVE PROTEIN MICRO: Recent Results (from the past 720 hour(s))  Culture, blood (Routine X 2)  w Reflex to ID Panel     Status: None (Preliminary result)   Collection Time: 04/22/16  2:13 PM  Result Value Ref Range Status   Specimen Description   Final    BLOOD LEFT ANTECUBITAL Performed at [place]  Final   Culture  Setup Time   Final    Organism ID to follow [place] AND ANAEROBIC BOTTLES CRITICAL RESULT CALLED TO, READ BACK BY AND VERIFIED WITH: [place] ON 04/23/16 AT 0654 QSD    Culture GRAM POSITIVE COCCI  Final   Report Status PENDING  Incomplete  Blood Culture ID Panel (Reflexed)     Status: Abnormal   Collection Time: 04/22/16  2:13 PM  Result Value Ref Range Status   Enterococcus species NOT DETECTED NOT DETECTED Final   Listeria monocytogenes NOT DETECTED NOT DETECTED Final   Staphylococcus species DETECTED (A) NOT DETECTED Final    Comment:  CRITICAL RESULT CALLED TO, READ BACK BY AND VERIFIED WITH: NATE COOKSON ON 04/23/16 AT 0654 QSD    Staphylococcus aureus DETECTED (A) NOT DETECTED Final    Comment: CRITICAL RESULT CALLED TO, READ BACK BY AND VERIFIED WITH: NATE COOKSON ON 04/23/16 AT 0654 QSD    Methicillin resistance NOT DETECTED NOT DETECTED Final   Streptococcus species NOT DETECTED NOT DETECTED Final   Streptococcus agalactiae NOT DETECTED NOT DETECTED Final   Streptococcus pneumoniae NOT DETECTED NOT DETECTED Final   Streptococcus pyogenes NOT DETECTED NOT DETECTED Final   Acinetobacter baumannii NOT DETECTED NOT DETECTED Final   Enterobacteriaceae species NOT DETECTED NOT DETECTED Final   Enterobacter cloacae complex NOT DETECTED NOT DETECTED Final   Escherichia coli NOT DETECTED NOT DETECTED Final   Klebsiella oxytoca NOT DETECTED NOT DETECTED Final   Klebsiella pneumoniae NOT DETECTED NOT DETECTED Final   Proteus species NOT DETECTED NOT DETECTED Final   Serratia marcescens NOT DETECTED NOT DETECTED Final   Haemophilus influenzae NOT DETECTED NOT DETECTED Final   Neisseria meningitidis NOT DETECTED NOT DETECTED Final   Pseudomonas aeruginosa NOT DETECTED NOT DETECTED Final   Candida albicans NOT DETECTED NOT DETECTED Final   Candida glabrata NOT DETECTED NOT DETECTED Final   Candida krusei NOT DETECTED NOT DETECTED Final   Candida parapsilosis NOT DETECTED NOT DETECTED Final   Candida tropicalis NOT DETECTED NOT DETECTED Final  Culture, blood (Routine X 2) w Reflex to ID Panel     Status: None (Preliminary result)   Collection Time: 04/22/16  2:14 PM  Result Value Ref Range Status   Specimen Description BLOOD RIGHT ARM  Final   Special Requests BOTTLES DRAWN AEROBIC AND ANAEROBIC 4ML  Final   Culture  Setup Time   Final    GRAM POSITIVE COCCI IN BOTH AEROBIC AND ANAEROBIC BOTTLES   Culture GRAM POSITIVE COCCI  Final   Report Status PENDING  Incomplete    IMAGING: Dg Chest 2 View  Result Date:  04/22/2016 CLINICAL DATA:  Injured lifting last week now with neck pain EXAM: CHEST  2 VIEW COMPARISON:  None. FINDINGS: The lungs are not optimally aerated. There is minimal patchy opacity at the left lung base which could be posteriorly positioned suspicious for pneumonia. Some of the opacity on the lateral view is most likely due to degenerative spurring in the lower thoracic spine. No effusion is seen. The heart is mildly enlarged. The bones are osteopenic and there are diffuse degenerative changes throughout  the thoracic spine. IMPRESSION: 1. Cannot exclude a small patchy area of pneumonia within the left lower lobe. 2. Poor inspiration. Electronically Signed   By: Ivar Drape M.D.   On: 04/22/2016 14:42   Ct Cervical Spine Wo Contrast  Result Date: 04/22/2016 CLINICAL DATA:  Neck pain after moving a bucket last week EXAM: CT CERVICAL SPINE WITHOUT CONTRAST TECHNIQUE: Multidetector CT imaging of the cervical spine was performed without intravenous contrast. Multiplanar CT image reconstructions were also generated. COMPARISON:  None. FINDINGS: Alignment: Sagittal images shows normal alignment. Skull base and vertebrae: No acute fracture or subluxation. There are degenerative changes C1-C2 articulation. There is mild posterior spurring at C5-C6 level. Mild anterior spurring at C5-C6 and C6-C7 level. Moderate posterior spurring at C6-C7 level. Minimal anterior spurring at C3-C4 level. Soft tissues and spinal canal: No prevertebral soft tissue swelling. There is mild spinal canal stenosis at C5-C6 level due to posterior spurring. Moderate stenosis spinal canal left lateral recess due to posterior spurring at C6-C7 level. Disc levels: There is mild disc space flattening at C3-C4 level. Mild to moderate disc space flattening at C5-C6 level. Significant disc space flattening with vacuum disc phenomenon at C6-C7 level. Upper chest: There is mild thickening of upper esophageal wall. Gastroesophageal reflux cannot be  excluded. There is no pneumothorax in visualized lung apices. Other: There is small upper pneumomediastinum. This may be posttraumatic in nature. Clinical correlation necessary. Please note there is soft tissue air in soft tissue neck just lateral to right TMJ and lateral to right sphenoid. IMPRESSION: 1. No acute fracture or subluxation. Degenerative changes multilevel as described above. Most significant at C5-C6 and C6-C7 level. No prevertebral soft tissue swelling. Cervical airway is patent. There is neck soft tissue air on the right side just lateral to mandible and lateral to right sphenoid. Please see axial image 6. 2. Small upper pneumomediastinum. This may be post traumatic in nature. Clinical correlation is necessary. 3. Mild thickening of upper esophageal wall. Gastroesophageal reflux cannot be excluded. Clinical correlation is necessary. Electronically Signed   By: Lahoma Crocker M.D.   On: 04/22/2016 10:38    Assessment:   Marcus Kemp is a 82 y.o. male MSSA bacteremia and severe neck pain. The bacteremia is likely from a skin source as he has multiple old scabs and has cut himself frequently in the yard and while fishing but no recent boils or cellulitis.  He has hx of RHD and MR so high risk also of endocarditis.  Recommendations Change vanco to ancef  Check ECHO Repeat bcx Check MRI C spine to eval for abscess or discitis. CT unrevealing but not a good study for discitis and abscess.  Will need to remain in hospital over weekend. Will need picc placed once bcx neg x 48 hours.  May need TEE Thank you very much for allowing me to participate in the care of this patient. Please call with questions.   Cheral Marker. Ola Spurr, MD

## 2016-04-23 NOTE — Progress Notes (Signed)
HR running 130 this shift called Dr. Pollyann Samples and she ordered 500cc bolus and stated  To continue to monitor pt.

## 2016-04-23 NOTE — Progress Notes (Signed)
Pharmacy Antibiotic Note  Marcus Kemp is a 82 y.o. male admitted on 04/22/2016 with pneumonia and bacteremia.  Pharmacy has been consulted for cefazolin dosing for MSSA bacteremia Patient is also receiving levofloxacin 750mg .   Plan: Will start the patient on Cefazolin 2gm IV every 8 hours. Patient has anaphylaxis allergy to PCN listed, dicussed with Dr. Ola Spurr. Patient has tolerated ceftriaxone before.   Height: 5\' 6"  (167.6 cm) Weight: 164 lb (74.4 kg) IBW/kg (Calculated) : 63.8  Temp (24hrs), Avg:97.8 F (36.6 C), Min:97.3 F (36.3 C), Max:98.3 F (36.8 C)   Recent Labs Lab 04/22/16 0925 04/22/16 1413 04/22/16 1844 04/23/16 0339  WBC 19.5*  --   --  18.3*  CREATININE 0.87  --   --  0.80  LATICACIDVEN  --  1.1 1.9  --     Estimated Creatinine Clearance: 57.6 mL/min (by C-G formula based on SCr of 0.8 mg/dL).    Allergies  Allergen Reactions  . Penicillins Anaphylaxis    Antimicrobials this admission: 9/29 Vancomycin >>> 9/29 9/29 Levofloxacin>> 9/29Cefazolin 2gm >>  Dose adjustments this admission:   Microbiology results: 9/28 BCx: GPC X2 9/28 MRSA PCR: pending  Results for orders placed or performed during the hospital encounter of 04/22/16  Blood Culture ID Panel (Reflexed) (Collected: 04/22/2016  2:13 PM)  Result Value Ref Range   Enterococcus species NOT DETECTED NOT DETECTED   Listeria monocytogenes NOT DETECTED NOT DETECTED   Staphylococcus species DETECTED (A) NOT DETECTED   Staphylococcus aureus DETECTED (A) NOT DETECTED   Methicillin resistance NOT DETECTED NOT DETECTED   Streptococcus species NOT DETECTED NOT DETECTED   Streptococcus agalactiae NOT DETECTED NOT DETECTED   Streptococcus pneumoniae NOT DETECTED NOT DETECTED   Streptococcus pyogenes NOT DETECTED NOT DETECTED   Acinetobacter baumannii NOT DETECTED NOT DETECTED   Enterobacteriaceae species NOT DETECTED NOT DETECTED   Enterobacter cloacae complex NOT DETECTED NOT DETECTED   Escherichia coli NOT DETECTED NOT DETECTED   Klebsiella oxytoca NOT DETECTED NOT DETECTED   Klebsiella pneumoniae NOT DETECTED NOT DETECTED   Proteus species NOT DETECTED NOT DETECTED   Serratia marcescens NOT DETECTED NOT DETECTED   Haemophilus influenzae NOT DETECTED NOT DETECTED   Neisseria meningitidis NOT DETECTED NOT DETECTED   Pseudomonas aeruginosa NOT DETECTED NOT DETECTED   Candida albicans NOT DETECTED NOT DETECTED   Candida glabrata NOT DETECTED NOT DETECTED   Candida krusei NOT DETECTED NOT DETECTED   Candida parapsilosis NOT DETECTED NOT DETECTED   Candida tropicalis NOT DETECTED NOT DETECTED     Thank you for allowing pharmacy to be a part of this patient's care.  Nancy Fetter, PharmD Clinical Pharmacist 04/23/2016 3:36 PM

## 2016-04-23 NOTE — Care Management (Addendum)
Patient admitted with sepsis probably due to pneumonia. On room air.  Issues with heart rate averaging in the 120's   Independent in all adls, denies issues accessing medical care, obtaining medications or with transportation.  Current with  PCP.

## 2016-04-23 NOTE — Progress Notes (Signed)
Pt. Alert and oriented with moments of confusion.

## 2016-04-23 NOTE — Progress Notes (Signed)
Pt complaining of cough that he can't get rid of after breathing treatment, which in turn is causing his neck to hurt again. Pt has got morphine and oxycodone for the neck pain. MD paged to see if we can get something for his cough. Dr. Ara Kussmaul t put in for tessalon & some throat lozenges. Will continue to monitor. Conley Simmonds, RN

## 2016-04-23 NOTE — Progress Notes (Signed)
Dr. Verdell Carmine made aware of elevated HR... Will continue to monitor

## 2016-04-23 NOTE — Care Management Important Message (Signed)
Important Message  Patient Details  Name: Marcus Kemp MRN: [id number] Date of Birth: 10-04-1924   Medicare Important Message Given:  Yes    Katrina Stack, RN 04/23/2016, 11:52 AM

## 2016-04-23 NOTE — Progress Notes (Signed)
Pharmacy Antibiotic Follow-up Note  ELIAHS SPRUIELL is a 82 y.o. year-old male admitted on 04/22/2016.  The patient is currently on levofloxacin.  Assessment/Plan: Lab reports GPC in aerobic bottle of 1 set (although other bottles flagging positive, just gram stain incomplete), BCID showed staph aureus MecA not detected. Spoke with Dr. Estanislado Pandy. Will give 1 dose of vancomycin now and f/u gram stain in the afternoon.   Temp (24hrs), Avg:97.6 F (36.4 C), Min:97.3 F (36.3 C), Max:97.9 F (36.6 C)   Recent Labs Lab 04/22/16 0925 04/23/16 0339  WBC 19.5* 18.3*    Recent Labs Lab 04/22/16 0925 04/23/16 0339  CREATININE 0.87 0.80   Estimated Creatinine Clearance: 57.6 mL/min (by C-G formula based on SCr of 0.8 mg/dL).    Allergies  Allergen Reactions  . Penicillins Anaphylaxis    Thank you for allowing pharmacy to be a part of this patient's care.  Laural Benes, Pharm.D., BCPS Clinical Pharmacist 04/23/2016 6:58 AM

## 2016-04-24 ENCOUNTER — Inpatient Hospital Stay: Payer: Medicare Other

## 2016-04-24 LAB — CBC
HCT: 34 % — ABNORMAL LOW (ref 40.0–52.0)
Hemoglobin: 11.3 g/dL — ABNORMAL LOW (ref 13.0–18.0)
MCH: 32.1 pg (ref 26.0–34.0)
MCHC: 33.2 g/dL (ref 32.0–36.0)
MCV: 96.6 fL (ref 80.0–100.0)
PLATELETS: 202 10*3/uL (ref 150–440)
RBC: 3.52 MIL/uL — AB (ref 4.40–5.90)
RDW: 14.3 % (ref 11.5–14.5)
WBC: 15.2 10*3/uL — AB (ref 3.8–10.6)

## 2016-04-24 LAB — BASIC METABOLIC PANEL
Anion gap: 4 — ABNORMAL LOW (ref 5–15)
BUN: 21 mg/dL — AB (ref 6–20)
CO2: 24 mmol/L (ref 22–32)
Calcium: 8.1 mg/dL — ABNORMAL LOW (ref 8.9–10.3)
Chloride: 107 mmol/L (ref 101–111)
Creatinine, Ser: 0.82 mg/dL (ref 0.61–1.24)
Glucose, Bld: 111 mg/dL — ABNORMAL HIGH (ref 65–99)
POTASSIUM: 4.1 mmol/L (ref 3.5–5.1)
SODIUM: 135 mmol/L (ref 135–145)

## 2016-04-24 LAB — C-REACTIVE PROTEIN: CRP: 17.1 mg/dL — AB (ref <1.0)

## 2016-04-24 LAB — SEDIMENTATION RATE: SED RATE: 68 mm/h — AB (ref 0–20)

## 2016-04-24 MED ORDER — OXYCODONE HCL 5 MG PO TABS: 5.0000 mg | ORAL_TABLET | ORAL | 0 refills | Status: DC | PRN

## 2016-04-24 MED ORDER — IPRATROPIUM-ALBUTEROL 0.5-2.5 (3) MG/3ML IN SOLN: 3.0000 mL | RESPIRATORY_TRACT | Status: DC | PRN

## 2016-04-24 MED ORDER — ACETAMINOPHEN 325 MG PO TABS: 650.0000 mg | ORAL_TABLET | Freq: Four times a day (QID) | ORAL | Status: DC | PRN

## 2016-04-24 MED ORDER — CEFAZOLIN SODIUM-DEXTROSE 2-4 GM/100ML-% IV SOLN: 2.0000 g | Freq: Three times a day (TID) | INTRAVENOUS | Status: DC

## 2016-04-24 MED ORDER — GADOBENATE DIMEGLUMINE 529 MG/ML IV SOLN
15.0000 mL | Freq: Once | INTRAVENOUS | Status: AC | PRN
  Administered 2016-04-24: 15 mL via INTRAVENOUS

## 2016-04-24 MED ORDER — CYCLOBENZAPRINE HCL 10 MG PO TABS: 10.0000 mg | ORAL_TABLET | Freq: Three times a day (TID) | ORAL | 0 refills | Status: DC | PRN

## 2016-04-24 MED ORDER — LEVOFLOXACIN IN D5W 750 MG/150ML IV SOLN: 750.0000 mg | INTRAVENOUS | Status: DC

## 2016-04-24 MED ORDER — MORPHINE SULFATE (PF) 2 MG/ML IV SOLN: 2.0000 mg | INTRAVENOUS | 0 refills | Status: DC | PRN

## 2016-04-24 NOTE — Progress Notes (Signed)
Lorriane Shire from [facility] called stating Mr. Shoults had a bed ready, it was explained he was going to [facility]. We are still waiting for care link to arrive for [facility] transport.

## 2016-04-24 NOTE — Progress Notes (Signed)
Patient ID: Marcus Kemp, male   DOB: 08/29/1924, 82 y.o.   MRN: [id number]  [facility] PROGRESS NOTE  Marcus Kemp [id number] DOB: 1925-05-22 DOA: 04/22/2016 PCP: No primary care provider on file.  HPI/Subjective: Patient with neck pain. States his strength is okay in his arms and legs.  Objective: Vitals:   04/24/16 0855 04/24/16 1105  BP: 123/84 119/87  Pulse: (!) 126 (!) 128  Resp: 18 17  Temp: 99.3 F (37.4 C) 98 F (36.7 C)    Filed Weights   04/22/16 1700 04/22/16 2124 04/23/16 0500  Weight: 68 kg (149 lb 14.6 oz) 74.5 kg (164 lb 3.2 oz) 74.4 kg (164 lb)    ROS: Review of Systems  Constitutional: Negative for chills and fever.  Eyes: Negative for blurred vision.  Respiratory: Negative for cough and shortness of breath.   Cardiovascular: Negative for chest pain.  Gastrointestinal: Negative for abdominal pain, constipation, diarrhea, nausea and vomiting.  Genitourinary: Negative for dysuria.  Musculoskeletal: Positive for neck pain. Negative for joint pain.  Neurological: Positive for headaches. Negative for dizziness.   Exam: Physical Exam  Constitutional: He is oriented to person, place, and time.  HENT:  Nose: No mucosal edema.  Mouth/Throat: No oropharyngeal exudate or posterior oropharyngeal edema.  Eyes: Conjunctivae, EOM and lids are normal. Pupils are equal, round, and reactive to light.  Neck: No JVD present. Carotid bruit is not present. No edema present. No thyroid mass and no thyromegaly present.  Patient does not want to move his neck very much secondary to pain  Cardiovascular: S1 normal and S2 normal.  Tachycardia present.  Exam reveals no gallop.   No murmur heard. Pulses:      Dorsalis pedis pulses are 2+ on the right side, and 2+ on the left side.  Respiratory: No respiratory distress. He has no wheezes. He has no rhonchi. He has no rales.  GI: Soft. Bowel sounds are normal. There is no tenderness.  Genitourinary:   Genitourinary Comments: Swelling of the penis.  Musculoskeletal:       Right ankle: He exhibits no swelling.       Left ankle: He exhibits no swelling.  Lymphadenopathy:    He has no cervical adenopathy.  Neurological: He is alert and oriented to person, place, and time. No cranial nerve deficit.  Able to straight leg raise. Power 5 out of 5 upper and lower extremities.  Skin: Skin is warm. No rash noted. Nails show no clubbing.  Psychiatric: He has a normal mood and affect.      Data Reviewed: Basic Metabolic Panel:  Recent Labs Lab 04/22/16 0925 04/23/16 0339 04/24/16 0518  NA 136 136 135  K 4.2 4.3 4.1  CL 103 104 107  CO2 27 26 24   GLUCOSE 147* 137* 111*  BUN 26* 27* 21*  CREATININE 0.87 0.80 0.82  CALCIUM 8.9 8.5* 8.1*   CBC:  Recent Labs Lab 04/22/16 0925 04/23/16 0339 04/24/16 0518  WBC 19.5* 18.3* 15.2*  NEUTROABS 17.5*  --   --   HGB 12.5* 11.6* 11.3*  HCT 36.1* 34.5* 34.0*  MCV 95.6 97.7 96.6  PLT 186 194 202    CBG:  Recent Labs Lab 04/22/16 1725 04/22/16 2209  GLUCAP 116* 155*    Recent Results (from the past 240 hour(s))  Culture, blood (Routine X 2) w Reflex to ID Panel     Status: Abnormal (Preliminary result)   Collection Time: 04/22/16  2:13 PM  Result Value Ref Range  Status   Specimen Description BLOOD LEFT ANTECUBITAL  Final   Special Requests BOTTLES DRAWN AEROBIC AND ANAEROBIC 7ML  Final   Culture  Setup Time   Final    GRAM POSITIVE COCCI IN BOTH AEROBIC AND ANAEROBIC BOTTLES CRITICAL RESULT CALLED TO, READ BACK BY AND VERIFIED WITH: [place] ON 04/23/16 AT [id number] QSD Performed at [place] (A)  Final   Report Status PENDING  Incomplete  Blood Culture ID Panel (Reflexed)     Status: Abnormal   Collection Time: 04/22/16  2:13 PM  Result Value Ref Range Status   Enterococcus species NOT DETECTED NOT DETECTED Final   Listeria monocytogenes NOT DETECTED NOT DETECTED Final    Staphylococcus species DETECTED (A) NOT DETECTED Final    Comment: CRITICAL RESULT CALLED TO, READ BACK BY AND VERIFIED WITH: NATE COOKSON ON 04/23/16 AT 0654 QSD    Staphylococcus aureus DETECTED (A) NOT DETECTED Final    Comment: CRITICAL RESULT CALLED TO, READ BACK BY AND VERIFIED WITH: NATE COOKSON ON 04/23/16 AT 0654 QSD    Methicillin resistance NOT DETECTED NOT DETECTED Final   Streptococcus species NOT DETECTED NOT DETECTED Final   Streptococcus agalactiae NOT DETECTED NOT DETECTED Final   Streptococcus pneumoniae NOT DETECTED NOT DETECTED Final   Streptococcus pyogenes NOT DETECTED NOT DETECTED Final   Acinetobacter baumannii NOT DETECTED NOT DETECTED Final   Enterobacteriaceae species NOT DETECTED NOT DETECTED Final   Enterobacter cloacae complex NOT DETECTED NOT DETECTED Final   Escherichia coli NOT DETECTED NOT DETECTED Final   Klebsiella oxytoca NOT DETECTED NOT DETECTED Final   Klebsiella pneumoniae NOT DETECTED NOT DETECTED Final   Proteus species NOT DETECTED NOT DETECTED Final   Serratia marcescens NOT DETECTED NOT DETECTED Final   Haemophilus influenzae NOT DETECTED NOT DETECTED Final   Neisseria meningitidis NOT DETECTED NOT DETECTED Final   Pseudomonas aeruginosa NOT DETECTED NOT DETECTED Final   Candida albicans NOT DETECTED NOT DETECTED Final   Candida glabrata NOT DETECTED NOT DETECTED Final   Candida krusei NOT DETECTED NOT DETECTED Final   Candida parapsilosis NOT DETECTED NOT DETECTED Final   Candida tropicalis NOT DETECTED NOT DETECTED Final  Culture, blood (Routine X 2) w Reflex to ID Panel     Status: Abnormal (Preliminary result)   Collection Time: 04/22/16  2:14 PM  Result Value Ref Range Status   Specimen Description BLOOD RIGHT ARM  Final   Special Requests BOTTLES DRAWN AEROBIC AND ANAEROBIC 4ML  Final   Culture  Setup Time   Final    GRAM POSITIVE COCCI IN BOTH AEROBIC AND ANAEROBIC BOTTLES CRITICAL VALUE NOTED.  VALUE IS CONSISTENT WITH  PREVIOUSLY REPORTED AND CALLED VALUE. Performed at [place] (A)  Final   Report Status PENDING  Incomplete  Culture, blood (single) w Reflex to ID Panel     Status: None (Preliminary result)   Collection Time: 04/23/16  3:53 PM  Result Value Ref Range Status   Specimen Description BLOOD RIGHT HAND  Final   Special Requests BOTTLES DRAWN AEROBIC AND ANAEROBIC 6CC  Final   Culture NO GROWTH < 24 HOURS  Final   Report Status PENDING  Incomplete     Studies: Mr Cervical Spine W Wo Contrast  Addendum Date: 04/24/2016   ADDENDUM REPORT: 04/24/2016 14:01 ADDENDUM: Study discussed by telephone with Dr Loletha Grayer on 04/24/2016 at 1358 hours. Electronically Signed   By: Genevie Ann  M.D.   On: 04/24/2016 14:01   Result Date: 04/24/2016 CLINICAL DATA:  82 year old male with left side neck pain for 6 days. Decreased range of motion. Patient reports point tenderness left occiput region. Fever. Blood cultures positive for staph aureus. Initial encounter. EXAM: MRI CERVICAL SPINE WITHOUT AND WITH CONTRAST TECHNIQUE: Multiplanar and multiecho pulse sequences of the cervical spine, to include the craniocervical junction and cervicothoracic junction, were obtained according to standard protocol without and with intravenous contrast. CONTRAST:  10mL MULTIHANCE GADOBENATE DIMEGLUMINE 529 MG/ML IV SOLN COMPARISON:  Cervical spine CT 04/22/2016 FINDINGS: There is a left posterior paraspinal abscess encompassing about 41 x 32 x 55 mm (AP by transverse by CC). This partially involves the left trapezius muscle but is primarily located within the lateral deep left paraspinal muscles. See series 10, image 5 and series 7, image 5. There is surrounding 8 cm area of generalized left posterior neck soft tissue edema (series 5, image 15). This tracks to the midline and just across midline. There is also mild marrow edema and enhancement involving the left lamina and facets of C3 and C4.  See series 5, image 8 and series 5, image 13. There is trace facet joint fluid at C3-C4 and C4-C5. Furthermore there is moderate to severe abnormal posterior dural/epidural thickening and enhancement tracking from the lower cervical spine through to the cisterna magna, up to 6 mm in thickness in the upper cervical levels. See series 9 images 7 and 8. This is most pronounced from C5 and above. Furthermore there is a left C1 C2 lateral epidural abscess encompassing 6 x 6 x 12 mm (AP by transverse by CC). See series 9, image 10 and series 10, image 1. There are superimposed widespread cervical spine degenerative changes. Subsequently there is moderate to severe spinal stenosis from the C2 to the C5 level with mass effect on the spinal cord. However, However, there is no abnormal spinal cord signal identified at this time. Cervicomedullary junction is within normal limits. Negative visualized posterior fossa structures aside from the abnormal dural thickening continuing just inside the posterior skullbase. No vertebral body marrow edema. Below C6 there is no spinal stenosis. Major arterial flow voids in the neck are preserved. 1. Left C2 level posterior paraspinal Muscle Abscess measuring up to 4 -5 cm. Widespread surrounding cellulitis. 2. Associated left posterior element C3 and C4 Spinal Osteomyelitis. Left side facet joint effusion suspicious for early septic joint changes at C3-C4 and C4-C5. 3. Associated moderate to severe epidural space infection tracking from the skullbase to C5. Small left side epidural abscess within the spinal canal at C2. 4. Subsequent moderate to severe multifactorial spinal stenosis from the C2 to the C5 level. Spinal cord mass effect but no cord signal abnormality at this time. Electronically Signed: By: Genevie Ann M.D. On: 04/24/2016 13:29    Scheduled Meds: .  ceFAZolin (ANCEF) IV  2 g Intravenous Q8H  . enoxaparin (LOVENOX) injection  40 mg Subcutaneous Q24H  . Influenza vac split  quadrivalent PF  0.5 mL Intramuscular Tomorrow-1000  . levofloxacin (LEVAQUIN) IV  750 mg Intravenous Q24H  . pneumococcal 23 valent vaccine  0.5 mL Intramuscular Tomorrow-1000  . pravastatin  40 mg Oral q1800  . sodium chloride flush  3 mL Intravenous Q12H    Assessment/Plan:  1. Clinical sepsis with tachycardia and leukocytosis elevated lactic acid.  Source is abscess of cervical spine. Likely MSSA from blood cultures. Antibiotics switched from vancomycin over to cefazolin as per infectious disease. Likely  will need PICC line in 4-6 weeks worth of IV antibiotics. 2. Cervical spine abscess, osteomyelitis. Try to transfer to tertiary care center. Case discussed with neurosurgery and recommended medicine service for this case. Awaiting for bed availability. 3. Tachycardia. Continue to monitor closely. 4. Penis swelling. Stop IV fluids for now. 5. Hyperlipidemia unspecified continue pravastatin  Code Status:     Code Status Orders        Start     Ordered   04/22/16 1512  Full code  Continuous     04/22/16 1512    Code Status History    Date Active Date Inactive Code Status Order ID Comments User Context   04/22/2016  3:12 PM 04/23/2016 12:40 PM Full Code [id number]  Lytle Butte, MD ED     Family Communication: family at the bedside Disposition Plan: transfer to tertiary care center when bed available  Consultants:  Infectious disease  Antibiotics:  Ancef  Levaquin  Time spent: 70 minutes with coordination of care, and speaking with the patient and family on 2 separate occasions and speaking with tertiary care centers.  Loletha Grayer  [employer]

## 2016-04-24 NOTE — Progress Notes (Signed)
Central tele, Tele notified pt. Off tele and left building.

## 2016-04-24 NOTE — Progress Notes (Signed)
Pt's HR ran fluctuated between 120's and 130's throughout the night. 563ml Bolus given per MD order. Will continue to monitor pt.

## 2016-04-24 NOTE — Progress Notes (Signed)
[facility] called, report given, [facility] called with estimated arrival time.

## 2016-04-24 NOTE — Progress Notes (Addendum)
Patient is transfer to [facility] room [phone]  [facility] in a stable condition, report given to Wright , floor nurse , awaiting pick -up .

## 2016-04-24 NOTE — Progress Notes (Signed)
Pt. Slept well throughout the night requiring pain medication x3 with effective results. Family remained at bedside. No c/o or signs of SOB or acute distress observed. Will continue to monitor pt.

## 2016-04-24 NOTE — Discharge Summary (Signed)
[facility] [place] NAME: Marcus Kemp    MR#:  [id number]  DATE OF BIRTH:  July 20, 1925  DATE OF ADMISSION:  04/22/2016 ADMITTING PHYSICIAN: Lytle Butte, MD  DATE OF DISCHARGE: 04/24/2016  PRIMARY CARE PHYSICIAN: No primary care provider on file.    ADMISSION DIAGNOSIS:  Torticollis, acute [M43.6] Aspiration pneumonia of left lower lobe, unspecified aspiration pneumonia type ([place]) [J69.0]  DISCHARGE DIAGNOSIS:  Active Problems:   Sepsis ([place])   Community acquired pneumonia   SECONDARY DIAGNOSIS:   Past Medical History:  Diagnosis Date  . Hyperlipidemia     HOSPITAL COURSE:   1. Clinical sepsis. Patient with tachycardia, elevated lactic acid and leukocytosis. Patient found to have positive blood culture with MSSA. Patient also found to have abscess and osteomyelitis of the cervical spine. Patient's vancomycin was switched over to Ancef. Patient was also on Levaquin for possible pneumonia which I think is less likely. 2. Cervical spine abscess, osteomyelitis. I have called tertiary care centers and patient has been accepted and waiting for bed availability. I did discuss the case with neurosurgery and they did not feel that it was intermediate surgical case and recommended medicine service for IV antibiotics. Patient will need 4-6 weeks worth of IV antibiotics and PICC line. 3. Tachycardia continue to monitor closely 4. Penis swelling. Stop IV fluids for now 5. Hyperlipidemia unspecified continue pravastatin  DISCHARGE CONDITIONS:   [place]  CONSULTS OBTAINED:  Treatment Team:  Lytle Butte, MD Leonel Ramsay, MD  DRUG ALLERGIES:   Allergies  Allergen Reactions  . Penicillins Anaphylaxis    DISCHARGE MEDICATIONS:   Current Discharge Medication List    START taking these medications   Details  acetaminophen (TYLENOL) 325 MG tablet Take 2 tablets (650 mg total) by mouth every 6 (six) hours as needed for mild pain  (or Fever >/= 101).    ceFAZolin (ANCEF) 2-4 GM/100ML-% IVPB Inject 100 mLs (2 g total) into the vein every 8 (eight) hours. Qty: 1 each    cyclobenzaprine (FLEXERIL) 10 MG tablet Take 1 tablet (10 mg total) by mouth 3 (three) times daily as needed for muscle spasms. Qty: 30 tablet, Refills: 0    ipratropium-albuterol (DUONEB) 0.5-2.5 (3) MG/3ML SOLN Take 3 mLs by nebulization every 4 (four) hours as needed. Qty: 360 mL    levofloxacin (LEVAQUIN) 750 MG/150ML SOLN Inject 150 mLs (750 mg total) into the vein daily. Qty: 1000 mL    morphine 2 MG/ML injection Inject 1 mL (2 mg total) into the vein every 4 (four) hours as needed. Qty: 1 mL, Refills: 0    oxyCODONE (OXY IR/ROXICODONE) 5 MG immediate release tablet Take 1 tablet (5 mg total) by mouth every 4 (four) hours as needed for moderate pain. Qty: 30 tablet, Refills: 0      CONTINUE these medications which have NOT CHANGED   Details  lovastatin (MEVACOR) 40 MG tablet 1 tablet daily.    Multiple Vitamin (MULTI-VITAMINS) TABS Take 1 tablet by mouth daily.         DISCHARGE INSTRUCTIONS:   Follow-up with tertiary care center doctors and team in 1 day  If you experience worsening of your admission symptoms, develop shortness of breath, life threatening emergency, suicidal or homicidal thoughts you must seek medical attention immediately by calling 911 or calling your MD immediately  if symptoms less severe.  You Must read complete instructions/literature along with all the possible adverse reactions/side effects for all the Medicines  you take and that have been prescribed to you. Take any new Medicines after you have completely understood and accept all the possible adverse reactions/side effects.   Please note  You were cared for by a hospitalist during your hospital stay. If you have any questions about your discharge medications or the care you received while you were in the hospital after you are discharged, you can call the  unit and asked to speak with the hospitalist on call if the hospitalist that took care of you is not available. Once you are discharged, your primary care physician will handle any further medical issues. Please note that NO REFILLS for any discharge medications will be authorized once you are discharged, as it is imperative that you return to your primary care physician (or establish a relationship with a primary care physician if you do not have one) for your aftercare needs so that they can reassess your need for medications and monitor your lab values.    Today   CHIEF COMPLAINT:   Chief Complaint  Patient presents with  . Neck Pain    HISTORY OF PRESENT ILLNESS:  Marcus Kemp  is a 82 y.o. male presented with neck pain   VITAL SIGNS:  Blood pressure 119/87, pulse (!) 128, temperature 98 F (36.7 C), temperature source Oral, resp. rate 17, height 5\' 6"  (1.676 m), weight 74.4 kg (164 lb), SpO2 98 %.    PHYSICAL EXAMINATION:  GENERAL:  82 y.o.-year-old patient lying in the bed with no acute distress.  EYES: Pupils equal, round, reactive to light and accommodation. No scleral icterus. Extraocular muscles intact.  HEENT: Head atraumatic, normocephalic. Oropharynx and nasopharynx clear.  NECK:  Supple, no jugular venous distention. No thyroid enlargement, no tenderness. Decreased range of motion secondary to pain LUNGS: Normal breath sounds bilaterally, no wheezing, rales,rhonchi or crepitation. No use of accessory muscles of respiration.  CARDIOVASCULAR: S1, S2 Tachycardic. No murmurs, rubs, or gallops.  ABDOMEN: Soft, non-tender, non-distended. Bowel sounds present. No organomegaly or mass.  EXTREMITIES: No pedal edema, cyanosis, or clubbing.  NEUROLOGIC: Cranial nerves II through XII are intact. Muscle strength 5/5 in all extremities. Sensation intact. Gait not checked.  PSYCHIATRIC: The patient is alert and oriented x 3.  SKIN: No obvious rash, lesion, or ulcer.   DATA  REVIEW:   CBC  Recent Labs Lab 04/24/16 0518  WBC 15.2*  HGB 11.3*  HCT 34.0*  PLT 202    Chemistries   Recent Labs Lab 04/24/16 0518  NA 135  K 4.1  CL 107  CO2 24  GLUCOSE 111*  BUN 21*  CREATININE 0.82  CALCIUM 8.1*     Microbiology Results  Results for orders placed or performed during the hospital encounter of 04/22/16  Culture, blood (Routine X 2) w Reflex to ID Panel     Status: Abnormal (Preliminary result)   Collection Time: 04/22/16  2:13 PM  Result Value Ref Range Status   Specimen Description BLOOD LEFT ANTECUBITAL  Final   Special Requests BOTTLES DRAWN AEROBIC AND ANAEROBIC 7ML  Final   Culture  Setup Time   Final    GRAM POSITIVE COCCI IN BOTH AEROBIC AND ANAEROBIC BOTTLES CRITICAL RESULT CALLED TO, READ BACK BY AND VERIFIED WITH: [place] ON 04/23/16 AT [id number] QSD Performed at [facility] (A)  Final   Report Status PENDING  Incomplete  Blood Culture ID Panel (Reflexed)     Status: Abnormal   Collection Time:  04/22/16  2:13 PM  Result Value Ref Range Status   Enterococcus species NOT DETECTED NOT DETECTED Final   Listeria monocytogenes NOT DETECTED NOT DETECTED Final   Staphylococcus species DETECTED (A) NOT DETECTED Final    Comment: CRITICAL RESULT CALLED TO, READ BACK BY AND VERIFIED WITH: NATE COOKSON ON 04/23/16 AT 0654 QSD    Staphylococcus aureus DETECTED (A) NOT DETECTED Final    Comment: CRITICAL RESULT CALLED TO, READ BACK BY AND VERIFIED WITH: NATE COOKSON ON 04/23/16 AT 0654 QSD    Methicillin resistance NOT DETECTED NOT DETECTED Final   Streptococcus species NOT DETECTED NOT DETECTED Final   Streptococcus agalactiae NOT DETECTED NOT DETECTED Final   Streptococcus pneumoniae NOT DETECTED NOT DETECTED Final   Streptococcus pyogenes NOT DETECTED NOT DETECTED Final   Acinetobacter baumannii NOT DETECTED NOT DETECTED Final   Enterobacteriaceae species NOT DETECTED NOT DETECTED Final    Enterobacter cloacae complex NOT DETECTED NOT DETECTED Final   Escherichia coli NOT DETECTED NOT DETECTED Final   Klebsiella oxytoca NOT DETECTED NOT DETECTED Final   Klebsiella pneumoniae NOT DETECTED NOT DETECTED Final   Proteus species NOT DETECTED NOT DETECTED Final   Serratia marcescens NOT DETECTED NOT DETECTED Final   Haemophilus influenzae NOT DETECTED NOT DETECTED Final   Neisseria meningitidis NOT DETECTED NOT DETECTED Final   Pseudomonas aeruginosa NOT DETECTED NOT DETECTED Final   Candida albicans NOT DETECTED NOT DETECTED Final   Candida glabrata NOT DETECTED NOT DETECTED Final   Candida krusei NOT DETECTED NOT DETECTED Final   Candida parapsilosis NOT DETECTED NOT DETECTED Final   Candida tropicalis NOT DETECTED NOT DETECTED Final  Culture, blood (Routine X 2) w Reflex to ID Panel     Status: Abnormal (Preliminary result)   Collection Time: 04/22/16  2:14 PM  Result Value Ref Range Status   Specimen Description BLOOD RIGHT ARM  Final   Special Requests BOTTLES DRAWN AEROBIC AND ANAEROBIC 4ML  Final   Culture  Setup Time   Final    GRAM POSITIVE COCCI IN BOTH AEROBIC AND ANAEROBIC BOTTLES CRITICAL VALUE NOTED.  VALUE IS CONSISTENT WITH PREVIOUSLY REPORTED AND CALLED VALUE. Performed at [facility] (A)  Final   Report Status PENDING  Incomplete  Culture, blood (single) w Reflex to ID Panel     Status: None (Preliminary result)   Collection Time: 04/23/16  3:53 PM  Result Value Ref Range Status   Specimen Description BLOOD RIGHT HAND  Final   Special Requests BOTTLES DRAWN AEROBIC AND ANAEROBIC 6CC  Final   Culture NO GROWTH < 24 HOURS  Final   Report Status PENDING  Incomplete    RADIOLOGY:  Mr Cervical Spine W Wo Contrast  Addendum Date: 04/24/2016   ADDENDUM REPORT: 04/24/2016 14:01 ADDENDUM: Study discussed by telephone with Dr Loletha Grayer on 04/24/2016 at 1358 hours. Electronically Signed   By: Genevie Ann M.D.   On:  04/24/2016 14:01   Result Date: 04/24/2016 CLINICAL DATA:  82 year old male with left side neck pain for 6 days. Decreased range of motion. Patient reports point tenderness left occiput region. Fever. Blood cultures positive for staph aureus. Initial encounter. EXAM: MRI CERVICAL SPINE WITHOUT AND WITH CONTRAST TECHNIQUE: Multiplanar and multiecho pulse sequences of the cervical spine, to include the craniocervical junction and cervicothoracic junction, were obtained according to standard protocol without and with intravenous contrast. CONTRAST:  10mL MULTIHANCE GADOBENATE DIMEGLUMINE 529 MG/ML IV SOLN COMPARISON:  Cervical spine CT 04/22/2016  FINDINGS: There is a left posterior paraspinal abscess encompassing about 41 x 32 x 55 mm (AP by transverse by CC). This partially involves the left trapezius muscle but is primarily located within the lateral deep left paraspinal muscles. See series 10, image 5 and series 7, image 5. There is surrounding 8 cm area of generalized left posterior neck soft tissue edema (series 5, image 15). This tracks to the midline and just across midline. There is also mild marrow edema and enhancement involving the left lamina and facets of C3 and C4. See series 5, image 8 and series 5, image 13. There is trace facet joint fluid at C3-C4 and C4-C5. Furthermore there is moderate to severe abnormal posterior dural/epidural thickening and enhancement tracking from the lower cervical spine through to the cisterna magna, up to 6 mm in thickness in the upper cervical levels. See series 9 images 7 and 8. This is most pronounced from C5 and above. Furthermore there is a left C1 C2 lateral epidural abscess encompassing 6 x 6 x 12 mm (AP by transverse by CC). See series 9, image 10 and series 10, image 1. There are superimposed widespread cervical spine degenerative changes. Subsequently there is moderate to severe spinal stenosis from the C2 to the C5 level with mass effect on the spinal cord.  However, However, there is no abnormal spinal cord signal identified at this time. Cervicomedullary junction is within normal limits. Negative visualized posterior fossa structures aside from the abnormal dural thickening continuing just inside the posterior skullbase. No vertebral body marrow edema. Below C6 there is no spinal stenosis. Major arterial flow voids in the neck are preserved. 1. Left C2 level posterior paraspinal Muscle Abscess measuring up to 4 -5 cm. Widespread surrounding cellulitis. 2. Associated left posterior element C3 and C4 Spinal Osteomyelitis. Left side facet joint effusion suspicious for early septic joint changes at C3-C4 and C4-C5. 3. Associated moderate to severe epidural space infection tracking from the skullbase to C5. Small left side epidural abscess within the spinal canal at C2. 4. Subsequent moderate to severe multifactorial spinal stenosis from the C2 to the C5 level. Spinal cord mass effect but no cord signal abnormality at this time. Electronically Signed: By: Genevie Ann M.D. On: 04/24/2016 13:29   Management plans discussed with the patient, family and they are in agreement.  CODE STATUS:     Code Status Orders        Start     Ordered   04/22/16 1512  Full code  Continuous     04/22/16 1512    Code Status History    Date Active Date Inactive Code Status Order ID Comments User Context   04/22/2016  3:12 PM 04/23/2016 12:40 PM Full Code [id number]  Lytle Butte, MD ED      TOTAL TIME TAKING CARE OF THIS PATIENT: 70 minutes.    Loletha Grayer M.D on 04/24/2016 at 4:05 PM  Between 7am to 6pm - Pager - [phone]  After 6pm go to [email or web address] - password [email or web address]  [facility]  [phone]  CC: Primary care physician; No primary care provider on file.

## 2016-04-24 NOTE — Progress Notes (Addendum)
[facility] here to pick up pt. Tele removed. Family at bedside, Pt. Pre-medicated for transport, pt. A&O. [facility] aware. All transfer paperwork given to [facility].

## 2016-04-25 ENCOUNTER — Ambulatory Visit (HOSPITAL_COMMUNITY): Admission: AD | Admit: 2016-04-25 | Payer: Medicare Other | Source: Other Acute Inpatient Hospital | Admitting: *Deleted

## 2016-04-25 ENCOUNTER — Ambulatory Visit (HOSPITAL_COMMUNITY)
Admission: RE | Admit: 2016-04-25 | Discharge: 2016-04-25 | Disposition: A | Discharge status: Home / Self Care | Payer: Medicare Other | Attending: Specialist | Admitting: Specialist

## 2016-04-25 DIAGNOSIS — G062 Extradural and subdural abscess, unspecified: Secondary | ICD-10-CM | POA: Insufficient documentation

## 2016-04-25 DIAGNOSIS — M60009 Infective myositis, unspecified site: Secondary | ICD-10-CM | POA: Insufficient documentation

## 2016-04-25 DIAGNOSIS — L0291 Cutaneous abscess, unspecified: Secondary | ICD-10-CM | POA: Insufficient documentation

## 2016-04-25 DIAGNOSIS — A4101 Sepsis due to Methicillin susceptible Staphylococcus aureus: Secondary | ICD-10-CM | POA: Insufficient documentation

## 2016-04-25 DIAGNOSIS — M4622 Osteomyelitis of vertebra, cervical region: Secondary | ICD-10-CM | POA: Insufficient documentation

## 2016-04-25 LAB — CULTURE, BLOOD (ROUTINE X 2)

## 2016-04-26 DIAGNOSIS — I272 Pulmonary hypertension, unspecified: Secondary | ICD-10-CM

## 2016-04-26 HISTORY — DX: Pulmonary hypertension, unspecified: I27.20

## 2016-04-28 LAB — CULTURE, BLOOD (SINGLE): Culture: NO GROWTH

## 2016-05-10 ENCOUNTER — Encounter: Payer: Self-pay | Admitting: Emergency Medicine

## 2016-05-10 ENCOUNTER — Emergency Department
Admission: EM | Admit: 2016-05-10 | Discharge: 2016-05-10 | Disposition: A | Discharge status: Home / Self Care | Payer: Medicare Other | Attending: Emergency Medicine | Admitting: Emergency Medicine

## 2016-05-10 DIAGNOSIS — R609 Edema, unspecified: Secondary | ICD-10-CM | POA: Insufficient documentation

## 2016-05-10 DIAGNOSIS — R319 Hematuria, unspecified: Secondary | ICD-10-CM | POA: Diagnosis not present

## 2016-05-10 DIAGNOSIS — Z79899 Other long term (current) drug therapy: Secondary | ICD-10-CM | POA: Diagnosis not present

## 2016-05-10 DIAGNOSIS — M7989 Other specified soft tissue disorders: Secondary | ICD-10-CM | POA: Diagnosis present

## 2016-05-10 LAB — COMPREHENSIVE METABOLIC PANEL
ALBUMIN: 2.8 g/dL — AB (ref 3.5–5.0)
ALT: 7 U/L — ABNORMAL LOW (ref 17–63)
ANION GAP: 9 (ref 5–15)
AST: 40 U/L (ref 15–41)
Alkaline Phosphatase: 127 U/L — ABNORMAL HIGH (ref 38–126)
BUN: 26 mg/dL — AB (ref 6–20)
CHLORIDE: 101 mmol/L (ref 101–111)
CO2: 29 mmol/L (ref 22–32)
Calcium: 8.3 mg/dL — ABNORMAL LOW (ref 8.9–10.3)
Creatinine, Ser: 1.16 mg/dL (ref 0.61–1.24)
GFR calc Af Amer: >60 mL/min (ref >60)
GFR, EST NON AFRICAN AMERICAN: 54 mL/min — AB (ref >60)
Glucose, Bld: 99 mg/dL (ref 65–99)
POTASSIUM: 3.5 mmol/L (ref 3.5–5.1)
Sodium: 139 mmol/L (ref 135–145)
TOTAL PROTEIN: 6.3 g/dL — AB (ref 6.5–8.1)
Total Bilirubin: 0.8 mg/dL (ref 0.3–1.2)

## 2016-05-10 LAB — CBC WITH DIFFERENTIAL/PLATELET
BASOS ABS: 0 10*3/uL (ref 0–0.1)
BASOS PCT: 1 %
EOS PCT: 3 %
Eosinophils Absolute: 0.2 10*3/uL (ref 0–0.7)
HCT: 35.8 % — ABNORMAL LOW (ref 40.0–52.0)
Hemoglobin: 12.1 g/dL — ABNORMAL LOW (ref 13.0–18.0)
Lymphocytes Relative: 19 %
Lymphs Abs: 1.3 10*3/uL (ref 1.0–3.6)
MCH: 32.6 pg (ref 26.0–34.0)
MCHC: 34 g/dL (ref 32.0–36.0)
MCV: 95.9 fL (ref 80.0–100.0)
MONO ABS: 0.6 10*3/uL (ref 0.2–1.0)
MONOS PCT: 9 %
Neutro Abs: 4.8 10*3/uL (ref 1.4–6.5)
Neutrophils Relative %: 68 %
PLATELETS: 213 10*3/uL (ref 150–440)
RBC: 3.73 MIL/uL — ABNORMAL LOW (ref 4.40–5.90)
RDW: 15.2 % — AB (ref 11.5–14.5)
WBC: 6.9 10*3/uL (ref 3.8–10.6)

## 2016-05-10 LAB — TROPONIN I

## 2016-05-10 LAB — BRAIN NATRIURETIC PEPTIDE: B Natriuretic Peptide: 393 pg/mL — ABNORMAL HIGH (ref 0.0–100.0)

## 2016-05-10 NOTE — ED Provider Notes (Signed)
[facility] Emergency Department Provider Note    ____________________________________________   I have reviewed the triage vital signs and the nursing notes.   HISTORY  Chief Complaint Leg Swelling   History limited by: Not Limited   HPI Marcus Kemp is a 82 y.o. male who presents to the emergency department today because of concern for bilateral leg swelling by his urologist. The patient states that he has noticed the swelling for at least the past week. Today however he felt like the swelling was getting worse. Primarily noticing it in his lower legs and scrotum. He denies any chest pain or shortness of breath. Went to the urologist today who had concern for his leg swelling and referred him to the emergency department.    Past Medical History:  Diagnosis Date  . Hyperlipidemia     Patient Active Problem List   Diagnosis Date Noted  . Sepsis ([place]) 04/22/2016  . Community acquired pneumonia 04/22/2016    History reviewed. No pertinent surgical history.  Prior to Admission medications   Medication Sig Start Date End Date Taking? Authorizing Provider  acetaminophen (TYLENOL) 325 MG tablet Take 2 tablets (650 mg total) by mouth every 6 (six) hours as needed for mild pain (or Fever >/= 101). 04/24/16   Loletha Grayer, MD  ceFAZolin (ANCEF) 2-4 GM/100ML-% IVPB Inject 100 mLs (2 g total) into the vein every 8 (eight) hours. 04/24/16   Loletha Grayer, MD  cyclobenzaprine (FLEXERIL) 10 MG tablet Take 1 tablet (10 mg total) by mouth 3 (three) times daily as needed for muscle spasms. 04/24/16   Loletha Grayer, MD  ipratropium-albuterol (DUONEB) 0.5-2.5 (3) MG/3ML SOLN Take 3 mLs by nebulization every 4 (four) hours as needed. 04/24/16   Loletha Grayer, MD  levofloxacin (LEVAQUIN) 750 MG/150ML SOLN Inject 150 mLs (750 mg total) into the vein daily. 04/24/16   Loletha Grayer, MD  lovastatin (MEVACOR) 40 MG tablet 1 tablet daily. 10/20/15   Historical  Provider, MD  morphine 2 MG/ML injection Inject 1 mL (2 mg total) into the vein every 4 (four) hours as needed. 04/24/16   Loletha Grayer, MD  Multiple Vitamin (MULTI-VITAMINS) TABS Take 1 tablet by mouth daily.    Historical Provider, MD  oxyCODONE (OXY IR/ROXICODONE) 5 MG immediate release tablet Take 1 tablet (5 mg total) by mouth every 4 (four) hours as needed for moderate pain. 04/24/16   Loletha Grayer, MD    Allergies Penicillins  Family History  Problem Relation Age of Onset  . Hypertension Other     Social History Social History  Substance Use Topics  . Smoking status: Never Smoker  . Smokeless tobacco: Never Used  . Alcohol use No    Review of Systems  Constitutional: Negative for fever. Cardiovascular: Negative for chest pain. Respiratory: Negative for shortness of breath. Gastrointestinal: Negative for abdominal pain, vomiting and diarrhea. Genitourinary: Positive for hematuria.  Musculoskeletal: Negative for back pain. Positive for leg swelling. Skin: Negative for rash. Neurological: Negative for headaches, focal weakness or numbness.  10-point ROS otherwise negative.  ____________________________________________   PHYSICAL EXAM:  VITAL SIGNS: ED Triage Vitals  Enc Vitals Group     BP 05/10/16 1754 135/81     Pulse Rate 05/10/16 1754 (!) 58     Resp 05/10/16 1754 18     Temp 05/10/16 1754 98.1 F (36.7 C)     Temp Source 05/10/16 1754 Oral     SpO2 05/10/16 1754 97 %     Weight 05/10/16  1755 150 lb (68 kg)     Height 05/10/16 1755 5\' 6"  (1.676 m)   Constitutional: Alert and oriented. Well appearing and in no distress. Eyes: Conjunctivae are normal. Normal extraocular movements. ENT   Head: Normocephalic and atraumatic.   Nose: No congestion/rhinnorhea.   Mouth/Throat: Mucous membranes are moist.   Neck: No stridor. Hematological/Lymphatic/Immunilogical: No cervical lymphadenopathy. Cardiovascular: Normal rate, regular rhythm.  No  murmurs, rubs, or gallops. Respiratory: Normal respiratory effort without tachypnea nor retractions. Breath sounds are clear and equal bilaterally. No wheezes/rales/rhonchi. Gastrointestinal: Soft and nontender. No distention.  Genitourinary: Deferred Musculoskeletal: Normal range of motion in all extremities. Bilateral lower extremity edema.  Neurologic:  Normal speech and language. No gross focal neurologic deficits are appreciated.  Skin:  Skin is warm, dry and intact. No rash noted. Psychiatric: Mood and affect are normal. Speech and behavior are normal. Patient exhibits appropriate insight and judgment.  ____________________________________________    LABS (pertinent positives/negatives)  Labs Reviewed  CBC WITH DIFFERENTIAL/PLATELET - Abnormal; Notable for the following:       Result Value   RBC 3.73 (*)    Hemoglobin 12.1 (*)    HCT 35.8 (*)    RDW 15.2 (*)    All other components within normal limits  COMPREHENSIVE METABOLIC PANEL - Abnormal; Notable for the following:    BUN 26 (*)    Calcium 8.3 (*)    Total Protein 6.3 (*)    Albumin 2.8 (*)    ALT 7 (*)    Alkaline Phosphatase 127 (*)    GFR calc non Af Amer 54 (*)    All other components within normal limits  BRAIN NATRIURETIC PEPTIDE - Abnormal; Notable for the following:    B Natriuretic Peptide 393.0 (*)    All other components within normal limits  TROPONIN I     ____________________________________________   EKG  I, Nance Pear, attending physician, personally viewed and interpreted this EKG  EKG Time: 1805 Rate: 60 Rhythm: normal sinus rhythm Axis: normal Intervals: qtc 486 QRS: narrow ST changes: no st elevation Impression: normal ekg   ____________________________________________    RADIOLOGY  None  ____________________________________________   PROCEDURES  Procedures  ____________________________________________   INITIAL IMPRESSION / ASSESSMENT AND PLAN / ED  COURSE  Pertinent labs & imaging results that were available during my care of the patient were reviewed by me and considered in my medical decision making (see chart for details).  Patient with peripheral edema. Recent echo at [facility] with good LV function. No history of heart failure. Is on lasix. Protein and albumin a little low which could be contributing. At this point do not think blood clot given lack of pain and patient is on eliquis. Will plan on discharging home to follow up with primary care.  ____________________________________________   FINAL CLINICAL IMPRESSION(S) / ED DIAGNOSES  Final diagnoses:  Peripheral edema     Note: This dictation was prepared with Dragon dictation. Any transcriptional errors that result from this process are unintentional    Nance Pear, MD 05/10/16 1956

## 2016-05-10 NOTE — ED Triage Notes (Signed)
Pt was being seen by Dr Yves Dill for hematuria today and MD noticed edema in legs and sent him here for further eval.  Denies sob.

## 2016-05-10 NOTE — Discharge Instructions (Signed)
Please seek medical attention for any high fevers, chest pain, shortness of breath, change in behavior, persistent vomiting, bloody stool or any other new or concerning symptoms.  

## 2016-05-11 DIAGNOSIS — I4811 Longstanding persistent atrial fibrillation: Secondary | ICD-10-CM | POA: Diagnosis present

## 2016-09-24 DIAGNOSIS — I5033 Acute on chronic diastolic (congestive) heart failure: Secondary | ICD-10-CM | POA: Diagnosis present

## 2017-01-23 ENCOUNTER — Emergency Department
Admission: EM | Admit: 2017-01-23 | Discharge: 2017-01-23 | Disposition: A | Discharge status: Home / Self Care | Payer: Medicare Other | Attending: Emergency Medicine | Admitting: Emergency Medicine

## 2017-01-23 ENCOUNTER — Encounter: Payer: Self-pay | Admitting: Emergency Medicine

## 2017-01-23 DIAGNOSIS — I509 Heart failure, unspecified: Secondary | ICD-10-CM | POA: Diagnosis not present

## 2017-01-23 DIAGNOSIS — W5559XA Other contact with raccoon, initial encounter: Secondary | ICD-10-CM | POA: Diagnosis not present

## 2017-01-23 DIAGNOSIS — Z23 Encounter for immunization: Secondary | ICD-10-CM | POA: Insufficient documentation

## 2017-01-23 DIAGNOSIS — S80811A Abrasion, right lower leg, initial encounter: Secondary | ICD-10-CM | POA: Diagnosis not present

## 2017-01-23 DIAGNOSIS — Y929 Unspecified place or not applicable: Secondary | ICD-10-CM | POA: Insufficient documentation

## 2017-01-23 DIAGNOSIS — S8991XA Unspecified injury of right lower leg, initial encounter: Secondary | ICD-10-CM | POA: Diagnosis present

## 2017-01-23 DIAGNOSIS — Y999 Unspecified external cause status: Secondary | ICD-10-CM | POA: Insufficient documentation

## 2017-01-23 DIAGNOSIS — Z79899 Other long term (current) drug therapy: Secondary | ICD-10-CM | POA: Insufficient documentation

## 2017-01-23 DIAGNOSIS — Y9389 Activity, other specified: Secondary | ICD-10-CM | POA: Insufficient documentation

## 2017-01-23 HISTORY — DX: Unspecified atrial fibrillation: I48.91

## 2017-01-23 HISTORY — DX: Heart failure, unspecified: I50.9

## 2017-01-23 MED ORDER — RABIES VACCINE, PCEC IM SUSR
1.0000 mL | Freq: Once | INTRAMUSCULAR | Status: AC
  Administered 2017-01-23: 1 mL via INTRAMUSCULAR
  Filled 2017-01-23: qty 1

## 2017-01-23 MED ORDER — DOXYCYCLINE HYCLATE 50 MG PO CAPS: 100.0000 mg | ORAL_CAPSULE | Freq: Two times a day (BID) | ORAL | 0 refills | Status: AC

## 2017-01-23 MED ORDER — TETANUS-DIPHTH-ACELL PERTUSSIS 5-2.5-18.5 LF-MCG/0.5 IM SUSP
0.5000 mL | Freq: Once | INTRAMUSCULAR | Status: AC
  Administered 2017-01-23: 0.5 mL via INTRAMUSCULAR
  Filled 2017-01-23: qty 0.5

## 2017-01-23 MED ORDER — RABIES IMMUNE GLOBULIN 150 UNIT/ML IM INJ
20.0000 [IU]/kg | INJECTION | Freq: Once | INTRAMUSCULAR | Status: AC
  Administered 2017-01-23: 1425 [IU] via INTRAMUSCULAR
  Filled 2017-01-23: qty 9.5

## 2017-01-23 NOTE — ED Notes (Signed)
911 called to reach animal control. Spoke w/ operator, stated that animal control not on duty at this time but that deputy would call back. Left operator w/ RN number.

## 2017-01-23 NOTE — ED Provider Notes (Signed)
[facility] Emergency Department Provider Note  ____________________________________________  Time seen: Approximately 12:41 PM  I have reviewed the triage vital signs and the nursing notes.   HISTORY  Chief Complaint Abrasion (from [place])    HPI Marcus Kemp is a 83 y.o. male that presents to the emergency department after getting scratched by a raccoon. Patient states that the raccoons were eating his corn so he was trapping them. He was carrying a cage to close to his leg when the raccoon reached out and scratched him. The raccoon is dead at the end of his field.He cleaned the scratch marks with water and peroxide. He does not remember last tetanus shot. He gets anaphylaxis with penicillin.  He denies fever, shortness breath, chest pain, nausea, vomiting, abdominal pain.   Past Medical History:  Diagnosis Date  . A-fib ([place])   . CHF (congestive heart failure) ([place])   . Hyperlipidemia     Patient Active Problem List   Diagnosis Date Noted  . Sepsis ([place]) 04/22/2016  . Community acquired pneumonia 04/22/2016    History reviewed. No pertinent surgical history.  Prior to Admission medications   Medication Sig Start Date End Date Taking? Authorizing Provider  acetaminophen (TYLENOL) 325 MG tablet Take 2 tablets (650 mg total) by mouth every 6 (six) hours as needed for mild pain (or Fever >/= 101). 04/24/16   Loletha Grayer, MD  ceFAZolin (ANCEF) 2-4 GM/100ML-% IVPB Inject 100 mLs (2 g total) into the vein every 8 (eight) hours. 04/24/16   Loletha Grayer, MD  cyclobenzaprine (FLEXERIL) 10 MG tablet Take 1 tablet (10 mg total) by mouth 3 (three) times daily as needed for muscle spasms. 04/24/16   Loletha Grayer, MD  doxycycline (VIBRAMYCIN) 50 MG capsule Take 2 capsules (100 mg total) by mouth 2 (two) times daily. 01/23/17 02/02/17  Laban Emperor, PA-C  ipratropium-albuterol (DUONEB) 0.5-2.5 (3) MG/3ML SOLN Take 3 mLs by nebulization every 4 (four)  hours as needed. 04/24/16   Loletha Grayer, MD  levofloxacin (LEVAQUIN) 750 MG/150ML SOLN Inject 150 mLs (750 mg total) into the vein daily. 04/24/16   Loletha Grayer, MD  lovastatin (MEVACOR) 40 MG tablet 1 tablet daily. 10/20/15   [provider]  morphine 2 MG/ML injection Inject 1 mL (2 mg total) into the vein every 4 (four) hours as needed. 04/24/16   Loletha Grayer, MD  Multiple Vitamin (MULTI-VITAMINS) TABS Take 1 tablet by mouth daily.    [provider]  oxyCODONE (OXY IR/ROXICODONE) 5 MG immediate release tablet Take 1 tablet (5 mg total) by mouth every 4 (four) hours as needed for moderate pain. 04/24/16   Loletha Grayer, MD    Allergies Penicillins  Family History  Problem Relation Age of Onset  . Hypertension Other     Social History Social History  Substance Use Topics  . Smoking status: Never Smoker  . Smokeless tobacco: Never Used  . Alcohol use No     Review of Systems  Constitutional: No fever/chills Cardiovascular: No chest pain. Respiratory: No SOB. Gastrointestinal: No abdominal pain.  No nausea, no vomiting.  Musculoskeletal: Negative for musculoskeletal pain. Skin: Negative for rash, ecchymosis. Positive for abrasion.   ____________________________________________   PHYSICAL EXAM:  VITAL SIGNS: ED Triage Vitals  Enc Vitals Group     BP 01/23/17 1111 133/67     Pulse Rate 01/23/17 1111 60     Resp 01/23/17 1111 20     Temp 01/23/17 1111 98 F (36.7 C)  Temp Source 01/23/17 1111 Oral     SpO2 01/23/17 1111 95 %     Weight 01/23/17 1112 158 lb (71.7 kg)     Height 01/23/17 1112 5\' 6"  (1.676 m)     Head Circumference --      Peak Flow --      Pain Score 01/23/17 1115 0     Pain Loc --      Pain Edu? --      Excl. in [place]? --      Constitutional: Alert and oriented. Well appearing and in no acute distress. Eyes: Conjunctivae are normal. PERRL. EOMI. Head: Atraumatic. ENT:      Ears:      Nose: No  congestion/rhinnorhea.      Mouth/Throat: Mucous membranes are moist.  Neck: No stridor.   Cardiovascular: Normal rate, regular rhythm.  Good peripheral circulation. Respiratory: Normal respiratory effort without tachypnea or retractions. Lungs CTAB. Good air entry to the bases with no decreased or absent breath sounds. Musculoskeletal: Full range of motion to all extremities. No gross deformities appreciated. Neurologic:  Normal speech and language. No gross focal neurologic deficits are appreciated.  Skin:  Skin is warm, dry. 3 1 inch shallow scratch marks to back of right leg. No bleeding.   ____________________________________________   LABS (all labs ordered are listed, but only abnormal results are displayed)  Labs Reviewed - No data to display ____________________________________________  EKG   ____________________________________________  RADIOLOGY  No results found.  ____________________________________________    PROCEDURES  Procedure(s) performed:    Procedures    Medications  Tdap (BOOSTRIX) injection 0.5 mL (0.5 mLs Intramuscular Given 01/23/17 1258)  rabies vaccine (RABAVERT) injection 1 mL (1 mL Intramuscular Given 01/23/17 1405)  rabies immune globulin (HYPERAB) injection 1,425 Units (1,425 Units Intramuscular Given 01/23/17 1406)     ____________________________________________   INITIAL IMPRESSION / ASSESSMENT AND PLAN / ED COURSE  Pertinent labs & imaging results that were available during my care of the patient were reviewed by me and considered in my medical decision making (see chart for details).  Review of the  CSRS was performed in accordance of the [place] prior to dispensing any controlled drugs.   Patient's diagnosis is consistent with raccoon scratch. Vital signs and exam are reassuring. Animal control was notified and stated that they do not test raccoons for rabies. Education about rabies was provided. Patient received rabies  immunoglobulin and vaccine in ED. He will return to the emergency department for rest of rabies vaccine series. Tetanus shot was updated. Patient has an anaphylactic reaction to penicillin. Patient will be discharged home with prescriptions for doxycycline. Patient is given ED precautions to return to the ED for any worsening or new symptoms.     ____________________________________________  FINAL CLINICAL IMPRESSION(S) / ED DIAGNOSES  Final diagnoses:  Other contact with raccoon, initial encounter      NEW MEDICATIONS STARTED DURING THIS VISIT:  Discharge Medication List as of 01/23/2017  2:29 PM    START taking these medications   Details  doxycycline (VIBRAMYCIN) 50 MG capsule Take 2 capsules (100 mg total) by mouth 2 (two) times daily., Starting Sun 01/23/2017, Until Wed 02/02/2017, Print            This chart was dictated using voice recognition software/Dragon. Despite best efforts to proofread, errors can occur which can change the meaning. Any change was purely unintentional.    Laban Emperor, PA-C 01/23/17 1450    Lavonia Drafts, MD 01/23/17 1450

## 2017-01-23 NOTE — ED Notes (Addendum)
Received call back from deputy. They stated that they did not test raccoons, only cats and dogs. Informed them that pt had raccoon carcass available, did not change response.

## 2017-01-23 NOTE — ED Notes (Signed)
See triage note..the patient reports to ED w/ racoon scratches to posterior aspect of R leg.  Bleeding controlled, scratches range from 1.5" to 0.5".  Pt firmly sts that he was not bitten.  Resp even and unlabored, ambulatory w/o issue. NAD

## 2017-01-23 NOTE — ED Triage Notes (Signed)
Pt states he was trapping a racoon today and was carrying him in the cage when his leg brushed against the cage and the racoon scratched the back of his right leg.  Pt has 3 scratch marks to leg.  Pt states the Imagene [place]ller is dead now.

## 2017-01-26 ENCOUNTER — Emergency Department
Admission: EM | Admit: 2017-01-26 | Discharge: 2017-01-26 | Disposition: A | Discharge status: Home / Self Care | Payer: Medicare Other | Attending: Emergency Medicine | Admitting: Emergency Medicine

## 2017-01-26 ENCOUNTER — Encounter: Payer: Self-pay | Admitting: Emergency Medicine

## 2017-01-26 DIAGNOSIS — Z2914 Encounter for prophylactic rabies immune globin: Secondary | ICD-10-CM | POA: Diagnosis not present

## 2017-01-26 DIAGNOSIS — Z23 Encounter for immunization: Secondary | ICD-10-CM

## 2017-01-26 MED ORDER — RABIES VACCINE, PCEC IM SUSR
1.0000 mL | Freq: Once | INTRAMUSCULAR | Status: AC
  Administered 2017-01-26: 1 mL via INTRAMUSCULAR
  Filled 2017-01-26: qty 1

## 2017-01-26 NOTE — ED Triage Notes (Signed)
Here for rabies series. When shows paperwork noted to have unfilled antibiotic prescription

## 2017-01-26 NOTE — ED Provider Notes (Signed)
[facility] Emergency Department Provider Note  ____________________________________________  Time seen: Approximately 11:15 AM  I have reviewed the triage vital signs and the nursing notes.   HISTORY  Chief Complaint Rabies Injection   HPI Marcus Kemp is a 83 y.o. male who presents to the emergency department for his second rabies vaccination. On 01/23/2017, he was scratched a raccoon. He then presented to the emergency department and the rabies series was started that day. He denies any complications after the initial injections. He did not get his prescription for doxycycline filled. He states he did not know it was there and didn't know he needed to have it filled.  Past Medical History:  Diagnosis Date  . A-fib ([place])   . CHF (congestive heart failure) ([place])   . Hyperlipidemia     Patient Active Problem List   Diagnosis Date Noted  . Sepsis ([place]) 04/22/2016  . Community acquired pneumonia 04/22/2016    History reviewed. No pertinent surgical history.  Prior to Admission medications   Medication Sig Start Date End Date Taking? Authorizing Provider  acetaminophen (TYLENOL) 325 MG tablet Take 2 tablets (650 mg total) by mouth every 6 (six) hours as needed for mild pain (or Fever >/= 101). 04/24/16   Loletha Grayer, MD  ceFAZolin (ANCEF) 2-4 GM/100ML-% IVPB Inject 100 mLs (2 g total) into the vein every 8 (eight) hours. 04/24/16   Loletha Grayer, MD  cyclobenzaprine (FLEXERIL) 10 MG tablet Take 1 tablet (10 mg total) by mouth 3 (three) times daily as needed for muscle spasms. 04/24/16   Loletha Grayer, MD  doxycycline (VIBRAMYCIN) 50 MG capsule Take 2 capsules (100 mg total) by mouth 2 (two) times daily. 01/23/17 02/02/17  Laban Emperor, PA-C  ipratropium-albuterol (DUONEB) 0.5-2.5 (3) MG/3ML SOLN Take 3 mLs by nebulization every 4 (four) hours as needed. 04/24/16   Loletha Grayer, MD  levofloxacin (LEVAQUIN) 750 MG/150ML SOLN Inject 150 mLs (750  mg total) into the vein daily. 04/24/16   Loletha Grayer, MD  lovastatin (MEVACOR) 40 MG tablet 1 tablet daily. 10/20/15   [provider]  morphine 2 MG/ML injection Inject 1 mL (2 mg total) into the vein every 4 (four) hours as needed. 04/24/16   Loletha Grayer, MD  Multiple Vitamin (MULTI-VITAMINS) TABS Take 1 tablet by mouth daily.    [provider]  oxyCODONE (OXY IR/ROXICODONE) 5 MG immediate release tablet Take 1 tablet (5 mg total) by mouth every 4 (four) hours as needed for moderate pain. 04/24/16   Loletha Grayer, MD    Allergies Penicillins  Family History  Problem Relation Age of Onset  . Hypertension Other     Social History Social History  Substance Use Topics  . Smoking status: Never Smoker  . Smokeless tobacco: Never Used  . Alcohol use No    Review of Systems Constitutional: No fever/chills Eyes: No visual changes. Respiratory: Denies shortness of breath. Musculoskeletal: Negative for pain. Skin: Negative for concern of infection. Neurological: Negative for headaches, focal weakness or numbness. ____________________________________________   PHYSICAL EXAM:  VITAL SIGNS: ED Triage Vitals  Enc Vitals Group     BP 01/26/17 0908 131/81     Pulse Rate 01/26/17 0908 82     Resp 01/26/17 0908 18     Temp 01/26/17 0908 97.8 F (36.6 C)     Temp Source 01/26/17 0908 Oral     SpO2 01/26/17 0908 99 %     Weight 01/26/17 0909 158 lb (71.7 kg)  Height 01/26/17 0909 5\' 6"  (1.676 m)     Head Circumference --      Peak Flow --      Pain Score 01/26/17 0908 0     Pain Loc --      Pain Edu? --      Excl. in [place]? --     Constitutional: Alert and oriented. Well appearing and in no acute distress. Eyes: Conjunctivae are normal. PERRL. EOMI. Head: Atraumatic. Nose: No congestion/rhinnorhea. Mouth/Throat: Mucous membranes are moist.  Oropharynx non-erythematous. Neck: No stridor.  Cardiovascular: Normal rate, regular rhythm.  Respiratory:  Normal respiratory effort.  No retractions.. Gastrointestinal: Soft and nontender. No distention Musculoskeletal: No lower extremity tenderness nor edema.  No joint effusions. Neurologic:  Normal speech and language. No gross focal neurologic deficits are appreciated. Speech is normal. No gait instability. Skin:  Wounds to the back of the right lower extremity. Be healing, but have localized erythema surrounding each. No drainage noted. Psychiatric: Mood and affect are normal. Speech and behavior are normal.  ____________________________________________   LABS (all labs ordered are listed, but only abnormal results are displayed)  Labs Reviewed - No data to display ____________________________________________   RADIOLOGY  Not indicated ____________________________________________   PROCEDURES  Procedure(s) performed: Not indicated  ____________________________________________   INITIAL IMPRESSION / ASSESSMENT AND PLAN / ED COURSE     Pertinent labs & imaging results that were available during my care of the patient were reviewed by me and considered in my medical decision making (see chart for details).  83 year old male presenting to the emergency department for a second rabies vaccination. He was advised to get the prescription for the doxycycline filled and take it as prescribed. He verbalized understanding. He was advised to return for his second vaccination on 01/30/2017.  ____________________________________________   FINAL CLINICAL IMPRESSION(S) / ED DIAGNOSES  Final diagnoses:  Rabies, need for prophylactic vaccination against    Note:  This document was prepared using Dragon voice recognition software and may include unintentional dictation errors.    Victorino Dike, FNP 01/26/17 1120    Orbie Pyo, MD 01/26/17 [phone]

## 2017-01-26 NOTE — ED Notes (Signed)
Here for 2nd rabies vaccine 

## 2017-01-30 ENCOUNTER — Emergency Department
Admission: EM | Admit: 2017-01-30 | Discharge: 2017-01-30 | Disposition: A | Discharge status: Home / Self Care | Payer: Medicare Other | Attending: Emergency Medicine | Admitting: Emergency Medicine

## 2017-01-30 ENCOUNTER — Encounter: Payer: Self-pay | Admitting: Medical Oncology

## 2017-01-30 DIAGNOSIS — Z203 Contact with and (suspected) exposure to rabies: Secondary | ICD-10-CM | POA: Insufficient documentation

## 2017-01-30 DIAGNOSIS — I509 Heart failure, unspecified: Secondary | ICD-10-CM | POA: Diagnosis not present

## 2017-01-30 DIAGNOSIS — Z79899 Other long term (current) drug therapy: Secondary | ICD-10-CM | POA: Diagnosis not present

## 2017-01-30 DIAGNOSIS — Z23 Encounter for immunization: Secondary | ICD-10-CM

## 2017-01-30 MED ORDER — RABIES VACCINE, PCEC IM SUSR
1.0000 mL | Freq: Once | INTRAMUSCULAR | Status: AC
  Administered 2017-01-30: 1 mL via INTRAMUSCULAR
  Filled 2017-01-30: qty 1

## 2017-01-30 NOTE — ED Triage Notes (Signed)
Pt reports that he is here for his 3rd rabies injection

## 2017-01-30 NOTE — Discharge Instructions (Signed)
Return to the ED on Sunday, July 15 for your final rabies vaccine.

## 2017-01-30 NOTE — ED Provider Notes (Signed)
[facility] Emergency Department Provider Note ____________________________________________  Time seen: 1015  I have reviewed the triage vital signs and the nursing notes.  HISTORY  Chief Complaint  Rabies Injection  HPI Marcus Kemp is a 83 y.o. male presents to the ED for his third of 4 rabies vaccines following an encounter with a raccoon. Patient denies any interim complaints. He has tolerated the previously prescribed antibiotic without difficulty.  Past Medical History:  Diagnosis Date  . A-fib ([place])   . CHF (congestive heart failure) ([place])   . Hyperlipidemia     Patient Active Problem List   Diagnosis Date Noted  . Sepsis ([place]) 04/22/2016  . Community acquired pneumonia 04/22/2016    History reviewed. No pertinent surgical history.  Prior to Admission medications   Medication Sig Start Date End Date Taking? Authorizing Provider  acetaminophen (TYLENOL) 325 MG tablet Take 2 tablets (650 mg total) by mouth every 6 (six) hours as needed for mild pain (or Fever >/= 101). 04/24/16   Loletha Grayer, MD  ceFAZolin (ANCEF) 2-4 GM/100ML-% IVPB Inject 100 mLs (2 g total) into the vein every 8 (eight) hours. 04/24/16   Loletha Grayer, MD  cyclobenzaprine (FLEXERIL) 10 MG tablet Take 1 tablet (10 mg total) by mouth 3 (three) times daily as needed for muscle spasms. 04/24/16   Loletha Grayer, MD  doxycycline (VIBRAMYCIN) 50 MG capsule Take 2 capsules (100 mg total) by mouth 2 (two) times daily. 01/23/17 02/02/17  Laban Emperor, PA-C  ipratropium-albuterol (DUONEB) 0.5-2.5 (3) MG/3ML SOLN Take 3 mLs by nebulization every 4 (four) hours as needed. 04/24/16   Loletha Grayer, MD  levofloxacin (LEVAQUIN) 750 MG/150ML SOLN Inject 150 mLs (750 mg total) into the vein daily. 04/24/16   Loletha Grayer, MD  lovastatin (MEVACOR) 40 MG tablet 1 tablet daily. 10/20/15   [provider]  morphine 2 MG/ML injection Inject 1 mL (2 mg total) into the vein every  4 (four) hours as needed. 04/24/16   Loletha Grayer, MD  Multiple Vitamin (MULTI-VITAMINS) TABS Take 1 tablet by mouth daily.    [provider]  oxyCODONE (OXY IR/ROXICODONE) 5 MG immediate release tablet Take 1 tablet (5 mg total) by mouth every 4 (four) hours as needed for moderate pain. 04/24/16   Loletha Grayer, MD    Allergies Penicillins  Family History  Problem Relation Age of Onset  . Hypertension Other     Social History Social History  Substance Use Topics  . Smoking status: Never Smoker  . Smokeless tobacco: Never Used  . Alcohol use No    Review of Systems  Constitutional: Negative for fever. Eyes: Negative for visual changes. ENT: Negative for sore throat. Cardiovascular: Negative for chest pain. Respiratory: Negative for shortness of breath. Gastrointestinal: Negative for abdominal pain, vomiting and diarrhea. Musculoskeletal: Negative for back pain. Skin: Negative for rash. Neurological: Negative for headaches, focal weakness or numbness. ____________________________________________  PHYSICAL EXAM:  VITAL SIGNS: ED Triage Vitals [01/30/17 1000]  Enc Vitals Group     BP 126/85     Pulse Rate 79     Resp 20     Temp 97.8 F (36.6 C)     Temp Source Oral     SpO2 100 %     Weight 158 lb (71.7 kg)     Height 5\' 6"  (1.676 m)     Head Circumference      Peak Flow      Pain Score 0     Pain  Loc      Pain Edu?      Excl. in [place]?     Constitutional: Alert and oriented. Well appearing and in no distress. Head: Normocephalic and atraumatic. Eyes: Conjunctivae are normal. Normal extraocular movements Cardiovascular: Normal rate, regular rhythm. Normal distal pulses. Respiratory: Normal respiratory effort. No wheezes/rales/rhonchi. Musculoskeletal: Nontender with normal range of motion in all extremities.  Neurologic:  Normal gait without ataxia. Normal speech and language. No gross focal neurologic deficits are appreciated. Psychiatric:  Mood and affect are normal. Patient exhibits appropriate insight and judgment. ____________________________________________  PROCEDURES  Rabies vaccine 1 ml IM ____________________________________________  INITIAL IMPRESSION / ASSESSMENT AND PLAN / ED COURSE  Patient with an ED encounter for rabies vaccine. He will return to the ED in one week for his fourth and final vaccine. He should complete the previously prescribed antibiotic as directed. ____________________________________________  FINAL CLINICAL IMPRESSION(S) / ED DIAGNOSES  Final diagnoses:  Encounter for repeat administration of rabies vaccination      Melvenia Needles, PA-C 01/30/17 [place], [place], MD 01/30/17 [phone]

## 2017-01-31 DIAGNOSIS — R600 Localized edema: Secondary | ICD-10-CM | POA: Insufficient documentation

## 2017-01-31 DIAGNOSIS — R609 Edema, unspecified: Secondary | ICD-10-CM | POA: Insufficient documentation

## 2017-02-06 ENCOUNTER — Encounter: Payer: Self-pay | Admitting: Emergency Medicine

## 2017-02-06 ENCOUNTER — Emergency Department
Admission: EM | Admit: 2017-02-06 | Discharge: 2017-02-06 | Disposition: A | Discharge status: Home / Self Care | Payer: Medicare Other | Attending: Emergency Medicine | Admitting: Emergency Medicine

## 2017-02-06 DIAGNOSIS — Z203 Contact with and (suspected) exposure to rabies: Secondary | ICD-10-CM | POA: Insufficient documentation

## 2017-02-06 DIAGNOSIS — I509 Heart failure, unspecified: Secondary | ICD-10-CM | POA: Diagnosis not present

## 2017-02-06 DIAGNOSIS — Z23 Encounter for immunization: Secondary | ICD-10-CM

## 2017-02-06 DIAGNOSIS — Z79899 Other long term (current) drug therapy: Secondary | ICD-10-CM | POA: Diagnosis not present

## 2017-02-06 DIAGNOSIS — Z2914 Encounter for prophylactic rabies immune globin: Secondary | ICD-10-CM | POA: Insufficient documentation

## 2017-02-06 MED ORDER — RABIES VACCINE, PCEC IM SUSR
1.0000 mL | Freq: Once | INTRAMUSCULAR | Status: AC
  Administered 2017-02-06: 1 mL via INTRAMUSCULAR
  Filled 2017-02-06: qty 1

## 2017-02-06 NOTE — ED Triage Notes (Signed)
Pt here for final rabies vaccine  

## 2017-02-06 NOTE — ED Provider Notes (Signed)
[facility] Emergency Department Provider Note ____________________________________________  Time seen: 10:26 AM  I have reviewed the triage vital signs and the nursing notes.  HISTORY  Chief Complaint  Rabies Injection   HPI Marcus Kemp is a 83 y.o. male is here for his final rabies immunization. Patient denies any difficulty with the previous injections.    Past Medical History:  Diagnosis Date  . A-fib ([place])   . CHF (congestive heart failure) ([place])   . Hyperlipidemia     Patient Active Problem List   Diagnosis Date Noted  . Sepsis ([place]) 04/22/2016  . Community acquired pneumonia 04/22/2016    History reviewed. No pertinent surgical history.  Prior to Admission medications   Medication Sig Start Date End Date Taking? Authorizing Provider  acetaminophen (TYLENOL) 325 MG tablet Take 2 tablets (650 mg total) by mouth every 6 (six) hours as needed for mild pain (or Fever >/= 101). 04/24/16   Loletha Grayer, MD  ceFAZolin (ANCEF) 2-4 GM/100ML-% IVPB Inject 100 mLs (2 g total) into the vein every 8 (eight) hours. 04/24/16   Loletha Grayer, MD  cyclobenzaprine (FLEXERIL) 10 MG tablet Take 1 tablet (10 mg total) by mouth 3 (three) times daily as needed for muscle spasms. 04/24/16   Loletha Grayer, MD  ipratropium-albuterol (DUONEB) 0.5-2.5 (3) MG/3ML SOLN Take 3 mLs by nebulization every 4 (four) hours as needed. 04/24/16   Loletha Grayer, MD  levofloxacin (LEVAQUIN) 750 MG/150ML SOLN Inject 150 mLs (750 mg total) into the vein daily. 04/24/16   Loletha Grayer, MD  lovastatin (MEVACOR) 40 MG tablet 1 tablet daily. 10/20/15   [provider]  morphine 2 MG/ML injection Inject 1 mL (2 mg total) into the vein every 4 (four) hours as needed. 04/24/16   Loletha Grayer, MD  Multiple Vitamin (MULTI-VITAMINS) TABS Take 1 tablet by mouth daily.    [provider]  oxyCODONE (OXY IR/ROXICODONE) 5 MG immediate release tablet Take 1 tablet  (5 mg total) by mouth every 4 (four) hours as needed for moderate pain. 04/24/16   Loletha Grayer, MD    Allergies Penicillins  Family History  Problem Relation Age of Onset  . Hypertension Other     Social History Social History  Substance Use Topics  . Smoking status: Never Smoker  . Smokeless tobacco: Never Used  . Alcohol use No    Review of Systems  Constitutional: Negative for fever. Cardiovascular: Negative for chest pain. Respiratory: Negative for shortness of breath. Skin: No infection Neurological: Negative for headaches, focal weakness or numbness. ____________________________________________  PHYSICAL EXAM:  VITAL SIGNS: ED Triage Vitals [02/06/17 1013]  Enc Vitals Group     BP 118/71     Pulse Rate 75     Resp 16     Temp 98.2 F (36.8 C)     Temp Source Oral     SpO2 98 %     Weight 158 lb (71.7 kg)     Height 5\' 6"  (1.676 m)     Head Circumference      Peak Flow      Pain Score      Pain Loc      Pain Edu?      Excl. in [place]?     Constitutional: Alert and oriented. Well appearing and in no distress. Head: Normocephalic and atraumatic. Eyes: Conjunctivae are normal.  Neck: No stridor Cardiovascular: Normal rate, regular rhythm. Normal distal pulses. Respiratory: Normal respiratory effort. No wheezes/rales/rhonchi. Musculoskeletal: Ambulatory without assistance.  Neurologic:  Normal gait. Normal speech and language. No gross focal neurologic deficits are appreciated. Skin:  Skin is warm, dry and intact. No rash noted. Psychiatric: Mood and affect are normal. Patient exhibits appropriate insight and judgment.   INITIAL IMPRESSION / ASSESSMENT AND PLAN / ED COURSE  Patient was given last rabies immunization injection. He is follow-up with his PCP if any problems.    ____________________________________________  FINAL CLINICAL IMPRESSION(S) / ED DIAGNOSES  Final diagnoses:  Need for immunization against rabies     Johnn Hai,  PA-C 02/06/17 1050    Delman Kitten, MD 02/06/17 1309

## 2017-07-12 DIAGNOSIS — R0989 Other specified symptoms and signs involving the circulatory and respiratory systems: Secondary | ICD-10-CM | POA: Insufficient documentation

## 2017-07-13 DIAGNOSIS — Z9889 Other specified postprocedural states: Secondary | ICD-10-CM | POA: Insufficient documentation

## 2017-09-19 DIAGNOSIS — I071 Rheumatic tricuspid insufficiency: Secondary | ICD-10-CM | POA: Insufficient documentation

## 2018-09-14 ENCOUNTER — Other Ambulatory Visit: Payer: Self-pay | Admitting: Physician Assistant

## 2018-09-14 DIAGNOSIS — I714 Abdominal aortic aneurysm, without rupture, unspecified: Secondary | ICD-10-CM

## 2020-06-17 ENCOUNTER — Other Ambulatory Visit: Payer: Self-pay | Admitting: Urology

## 2020-06-17 ENCOUNTER — Other Ambulatory Visit (HOSPITAL_COMMUNITY): Payer: Self-pay | Admitting: Urology

## 2020-06-17 DIAGNOSIS — R310 Gross hematuria: Secondary | ICD-10-CM

## 2020-07-04 ENCOUNTER — Other Ambulatory Visit: Payer: Self-pay

## 2020-07-04 ENCOUNTER — Ambulatory Visit
Admission: RE | Admit: 2020-07-04 | Discharge: 2020-07-04 | Disposition: A | Discharge status: Home / Self Care | Payer: Medicare Other | Source: Ambulatory Visit | Attending: Urology | Admitting: Urology

## 2020-07-04 DIAGNOSIS — R310 Gross hematuria: Secondary | ICD-10-CM | POA: Insufficient documentation

## 2020-07-04 LAB — POCT I-STAT CREATININE: Creatinine, Ser: 1.1 mg/dL (ref 0.61–1.24)

## 2020-07-04 MED ORDER — IOHEXOL 300 MG/ML  SOLN
125.0000 mL | Freq: Once | INTRAMUSCULAR | Status: AC | PRN
  Administered 2020-07-04: 125 mL via INTRAVENOUS

## 2020-07-09 ENCOUNTER — Other Ambulatory Visit (HOSPITAL_COMMUNITY): Payer: Self-pay | Admitting: Internal Medicine

## 2020-07-09 ENCOUNTER — Other Ambulatory Visit: Payer: Self-pay | Admitting: Internal Medicine

## 2020-07-09 ENCOUNTER — Other Ambulatory Visit: Payer: Self-pay

## 2020-07-09 ENCOUNTER — Ambulatory Visit
Admission: RE | Admit: 2020-07-09 | Discharge: 2020-07-09 | Disposition: A | Discharge status: Home / Self Care | Payer: Medicare Other | Source: Ambulatory Visit | Attending: Internal Medicine | Admitting: Internal Medicine

## 2020-07-09 DIAGNOSIS — S0990XA Unspecified injury of head, initial encounter: Secondary | ICD-10-CM | POA: Diagnosis not present

## 2020-07-09 DIAGNOSIS — S0993XA Unspecified injury of face, initial encounter: Secondary | ICD-10-CM

## 2020-07-17 ENCOUNTER — Ambulatory Visit
Admission: RE | Admit: 2020-07-17 | Discharge: 2020-07-17 | Disposition: A | Discharge status: Home / Self Care | Payer: Medicare Other | Source: Ambulatory Visit | Attending: Internal Medicine | Admitting: Internal Medicine

## 2020-07-17 ENCOUNTER — Other Ambulatory Visit: Payer: Self-pay

## 2020-07-17 DIAGNOSIS — S0993XA Unspecified injury of face, initial encounter: Secondary | ICD-10-CM

## 2021-04-16 ENCOUNTER — Other Ambulatory Visit: Payer: Self-pay | Admitting: Urology

## 2021-04-16 ENCOUNTER — Other Ambulatory Visit (HOSPITAL_COMMUNITY): Payer: Self-pay | Admitting: Urology

## 2021-04-16 DIAGNOSIS — C642 Malignant neoplasm of left kidney, except renal pelvis: Secondary | ICD-10-CM

## 2021-05-05 ENCOUNTER — Ambulatory Visit
Admission: RE | Admit: 2021-05-05 | Discharge: 2021-05-05 | Disposition: A | Discharge status: Home / Self Care | Payer: Medicare Other | Source: Ambulatory Visit | Attending: Urology | Admitting: Urology

## 2021-05-05 ENCOUNTER — Other Ambulatory Visit: Payer: Self-pay

## 2021-05-05 DIAGNOSIS — C642 Malignant neoplasm of left kidney, except renal pelvis: Secondary | ICD-10-CM | POA: Diagnosis not present

## 2021-05-05 DIAGNOSIS — I7143 Infrarenal abdominal aortic aneurysm, without rupture: Secondary | ICD-10-CM

## 2021-05-05 HISTORY — DX: Infrarenal abdominal aortic aneurysm, without rupture: I71.43

## 2021-05-05 LAB — POCT I-STAT CREATININE: Creatinine, Ser: 1.1 mg/dL (ref 0.61–1.24)

## 2021-05-05 MED ORDER — IOHEXOL 300 MG/ML  SOLN
100.0000 mL | Freq: Once | INTRAMUSCULAR | Status: AC | PRN
  Administered 2021-05-05: 100 mL via INTRAVENOUS

## 2021-05-18 ENCOUNTER — Encounter: Payer: Self-pay | Admitting: Oncology

## 2021-05-18 ENCOUNTER — Other Ambulatory Visit: Payer: Self-pay

## 2021-05-18 ENCOUNTER — Inpatient Hospital Stay: Payer: Medicare Other | Attending: Oncology | Admitting: Oncology

## 2021-05-18 ENCOUNTER — Inpatient Hospital Stay: Payer: Medicare Other

## 2021-05-18 VITALS — BP 127/84 | HR 84 | Temp 97.0°F | Resp 18 | Ht 66.0 in | Wt 148.7 lb

## 2021-05-18 DIAGNOSIS — Z87891 Personal history of nicotine dependence: Secondary | ICD-10-CM | POA: Diagnosis not present

## 2021-05-18 DIAGNOSIS — N2889 Other specified disorders of kidney and ureter: Secondary | ICD-10-CM | POA: Diagnosis not present

## 2021-05-18 DIAGNOSIS — K4090 Unilateral inguinal hernia, without obstruction or gangrene, not specified as recurrent: Secondary | ICD-10-CM | POA: Insufficient documentation

## 2021-05-18 DIAGNOSIS — N289 Disorder of kidney and ureter, unspecified: Secondary | ICD-10-CM

## 2021-05-18 DIAGNOSIS — N401 Enlarged prostate with lower urinary tract symptoms: Secondary | ICD-10-CM | POA: Insufficient documentation

## 2021-05-18 DIAGNOSIS — D509 Iron deficiency anemia, unspecified: Secondary | ICD-10-CM

## 2021-05-18 LAB — COMPREHENSIVE METABOLIC PANEL
ALT: 24 U/L (ref 0–44)
AST: 35 U/L (ref 15–41)
Albumin: 4.1 g/dL (ref 3.5–5.0)
Alkaline Phosphatase: 97 U/L (ref 38–126)
Anion gap: 6 (ref 5–15)
BUN: 30 mg/dL — ABNORMAL HIGH (ref 8–23)
CO2: 27 mmol/L (ref 22–32)
Calcium: 9.1 mg/dL (ref 8.9–10.3)
Chloride: 104 mmol/L (ref 98–111)
Creatinine, Ser: 1.23 mg/dL (ref 0.61–1.24)
GFR, Estimated: 55 mL/min — ABNORMAL LOW (ref >60)
Glucose, Bld: 94 mg/dL (ref 70–99)
Potassium: 5 mmol/L (ref 3.5–5.1)
Sodium: 137 mmol/L (ref 135–145)
Total Bilirubin: 2.2 mg/dL — ABNORMAL HIGH (ref 0.3–1.2)
Total Protein: 7.3 g/dL (ref 6.5–8.1)

## 2021-05-18 LAB — CBC WITH DIFFERENTIAL/PLATELET
Abs Immature Granulocytes: 0.02 10*3/uL (ref 0.00–0.07)
Basophils Absolute: 0.1 10*3/uL (ref 0.0–0.1)
Basophils Relative: 1 %
Eosinophils Absolute: 0.1 10*3/uL (ref 0.0–0.5)
Eosinophils Relative: 1 %
HCT: 41.4 % (ref 39.0–52.0)
Hemoglobin: 13.7 g/dL (ref 13.0–17.0)
Immature Granulocytes: 0 %
Lymphocytes Relative: 24 %
Lymphs Abs: 1.5 10*3/uL (ref 0.7–4.0)
MCH: 35.7 pg — ABNORMAL HIGH (ref 26.0–34.0)
MCHC: 33.1 g/dL (ref 30.0–36.0)
MCV: 107.8 fL — ABNORMAL HIGH (ref 80.0–100.0)
Monocytes Absolute: 0.5 10*3/uL (ref 0.1–1.0)
Monocytes Relative: 8 %
Neutro Abs: 4.1 10*3/uL (ref 1.7–7.7)
Neutrophils Relative %: 66 %
Platelets: 111 10*3/uL — ABNORMAL LOW (ref 150–400)
RBC: 3.84 MIL/uL — ABNORMAL LOW (ref 4.22–5.81)
RDW: 15.9 % — ABNORMAL HIGH (ref 11.5–15.5)
WBC: 6.3 10*3/uL (ref 4.0–10.5)
nRBC: 0 % (ref 0.0–0.2)

## 2021-05-18 LAB — LACTATE DEHYDROGENASE: LDH: 257 U/L — ABNORMAL HIGH (ref 98–192)

## 2021-05-18 NOTE — Progress Notes (Signed)
Pt here to establish care. No new concerns voiced.

## 2021-05-18 NOTE — Progress Notes (Addendum)
Hematology/Oncology Consult note [facility] [phone] [phone]   Patient Care Team: Idelle Crouch, MD as PCP - General (Internal Medicine)  REFERRING PROVIDER: Royston Cowper, MD  CHIEF COMPLAINTS/REASON FOR VISIT:  Evaluation of renal mass  HISTORY OF PRESENTING ILLNESS:   Marcus Kemp is a  87 y.o.  male with PMH listed below was seen in consultation at the request of  Royston Cowper, MD  for evaluation of renal mass  Patient follows up with [facility] Dr. Eliberto Ivory for left inguinal hernia, left renal mass, BPH. 07/04/2020, patient had a CT hematuria work-up which showed bilateral nephrolithiasis without obstructive uropathy. Apically enhancing posterior interpolar left renal lesion consistent with renal cell carcinoma.  No metastatic disease or left renal vein involvement.  Tiny bilateral pleural effusion, elevated right heart pressure evidenced by reflux of interest into the IVC and hepatic veins.  Hiatal hernia. Patient is on surveillance . 05/05/2021, CT abdomen pelvis showed interval increase in the size of solid enhancing lesion arising off the posterior cortex of the interpolar left kidney concerning for renal cell carcinoma.  Multiple bilateral kidney cysts.  Morphologic features of liver suggestive of early cirrhosis.  Cardiac enlargement with evidence of right heart failure small bilateral pleural effusion.  3 cm infrarenal aortic aorta aneurysm  Patient was referred to establish care with oncology for evaluation.  He reports frequent urination and urgency likely secondary to BPH.  Has a history of rheumatic heart disease with a heart murmur.  He remains quite active for his age.  He does yard work and helps house chores.  Denies any cough, unintentional weight loss, fever, night sweats.  Review of Systems  Constitutional:  Negative for appetite change, chills, fatigue, fever and unexpected weight change.  HENT:    Negative for hearing loss and voice change.   Eyes:  Negative for eye problems and icterus.  Respiratory:  Negative for chest tightness, cough and shortness of breath.   Cardiovascular:  Negative for chest pain and leg swelling.  Gastrointestinal:  Negative for abdominal distention and abdominal pain.  Endocrine: Negative for hot flashes.  Genitourinary:  Positive for frequency and nocturia. Negative for difficulty urinating and dysuria.   Musculoskeletal:  Positive for arthralgias.  Skin:  Negative for itching and rash.  Neurological:  Negative for light-headedness and numbness.  Hematological:  Negative for adenopathy. Does not bruise/bleed easily.  Psychiatric/Behavioral:  Negative for confusion.    MEDICAL HISTORY:  Past Medical History:  Diagnosis Date   A-fib [facility])    CHF (congestive heart failure) (HCC)    Heart murmur    Hyperlipidemia     SURGICAL HISTORY: History reviewed. No pertinent surgical history.  SOCIAL HISTORY: Social History   Socioeconomic History   Marital status: Married    Spouse name: Not on file   Number of children: Not on file   Years of education: Not on file   Highest education level: Not on file  Occupational History   Not on file  Tobacco Use   Smoking status: Former    Packs/day: 0.25    Years: 8.00    Pack years: 2.00    Types: Cigarettes    Quit date: 52    Years since quitting: 82.8   Smokeless tobacco: Never  Vaping Use   Vaping Use: Never used  Substance and Sexual Activity   Alcohol use: No   Drug use: Never   Sexual activity: Not on file  Other Topics Concern  Not on file  Social History Narrative   Not on file   Social Determinants of Health   Financial Resource Strain: Not on file  Food Insecurity: Not on file  Transportation Needs: Not on file  Physical Activity: Not on file  Stress: Not on file  Social Connections: Not on file  Intimate Partner Violence: Not on file    FAMILY HISTORY: Family History   Problem Relation Age of Onset   Heart disease Sister    Hypertension Other     ALLERGIES:  is allergic to penicillins.  MEDICATIONS:  Current Outpatient Medications  Medication Sig Dispense Refill   apixaban (ELIQUIS) 2.5 MG TABS tablet Take 1 tablet by mouth 2 (two) times daily.     furosemide (LASIX) 20 MG tablet Take 1 tablet by mouth daily.     levocetirizine (XYZAL) 5 MG tablet Take 5 mg by mouth at bedtime as needed for allergies.     metoprolol succinate (TOPROL-XL) 25 MG 24 hr tablet Take 25 mg by mouth daily.     Multiple Vitamin (MULTI-VITAMINS) TABS Take 1 tablet by mouth daily.     acetaminophen (TYLENOL) 325 MG tablet Take 2 tablets (650 mg total) by mouth every 6 (six) hours as needed for mild pain (or Fever >/= 101). (Patient not taking: Reported on 05/18/2021)     morphine 2 MG/ML injection Inject 1 mL (2 mg total) into the vein every 4 (four) hours as needed. (Patient not taking: Reported on 05/18/2021) 1 mL 0   oxyCODONE (OXY IR/ROXICODONE) 5 MG immediate release tablet Take 1 tablet (5 mg total) by mouth every 4 (four) hours as needed for moderate pain. (Patient not taking: Reported on 05/18/2021) 30 tablet 0   No current facility-administered medications for this visit.     PHYSICAL EXAMINATION: ECOG PERFORMANCE STATUS: 1 - Symptomatic but completely ambulatory Vitals:   05/18/21 1508  BP: 127/84  Pulse: 84  Resp: 18  Temp: (!) 97 F (36.1 C)   Filed Weights   05/18/21 1508  Weight: 148 lb 11.2 oz (67.4 kg)    Physical Exam Constitutional:      General: He is not in acute distress.    Comments: Patient ambulance independently  HENT:     Head: Normocephalic and atraumatic.  Eyes:     General: No scleral icterus. Cardiovascular:     Rate and Rhythm: Normal rate and regular rhythm.     Heart sounds: Murmur heard.  Pulmonary:     Effort: Pulmonary effort is normal. No respiratory distress.     Breath sounds: No wheezing.  Abdominal:     General:  Bowel sounds are normal. There is no distension.     Palpations: Abdomen is soft.  Musculoskeletal:        General: No deformity. Normal range of motion.     Cervical back: Normal range of motion and neck supple.  Skin:    General: Skin is warm and dry.     Findings: No erythema or rash.  Neurological:     Mental Status: He is alert and oriented to person, place, and time. Mental status is at baseline.     Cranial Nerves: No cranial nerve deficit.     Coordination: Coordination normal.  Psychiatric:        Mood and Affect: Mood normal.    LABORATORY DATA:  I have reviewed the data as listed Lab Results  Component Value Date   WBC 6.3 05/18/2021   HGB 13.7 05/18/2021  HCT 41.4 05/18/2021   MCV 107.8 (H) 05/18/2021   PLT 111 (L) 05/18/2021   Recent Labs    07/04/20 0927 05/05/21 1445 05/18/21 1554  NA  --   --  137  K  --   --  5.0  CL  --   --  104  CO2  --   --  27  GLUCOSE  --   --  94  BUN  --   --  30*  CREATININE 1.10 1.10 1.23  CALCIUM  --   --  9.1  GFRNONAA  --   --  55*  PROT  --   --  7.3  ALBUMIN  --   --  4.1  AST  --   --  35  ALT  --   --  24  ALKPHOS  --   --  97  BILITOT  --   --  2.2*   Iron/TIBC/Ferritin/ %Sat No results found for: IRON, TIBC, FERRITIN, IRONPCTSAT    RADIOGRAPHIC STUDIES: I have personally reviewed the radiological images as listed and agreed with the findings in the report. CT ABDOMEN PELVIS W WO CONTRAST  Result Date: 05/07/2021 CLINICAL DATA:  Follow-up CT from 07/14/2020. Evaluate kidney lesion EXAM: CT ABDOMEN AND PELVIS WITHOUT AND WITH CONTRAST TECHNIQUE: Multidetector CT imaging of the abdomen and pelvis was performed following the standard protocol before and following the bolus administration of intravenous contrast. CONTRAST:  156mL OMNIPAQUE IOHEXOL 300 MG/ML  SOLN COMPARISON:  07/04/2020 FINDINGS: Lower chest: There are small bilateral pleural effusions identified. Increase interlobular septal thickening is  identified within the lung bases. The heart size appears enlarged. Aortic atherosclerosis and coronary artery calcifications. Hepatobiliary: Passive venous congestion is noted with reflux of contrast material from the right atrium into the IVC and hepatic veins. Relative hypertrophy of the caudate lobe and lateral segment of left lobe of liver noted. Contour the liver is slightly irregular. Unchanged appearance of posterior right lobe of liver hypodensity which is favored to represent a benign cyst. No suspicious focal liver lesions identified. Pancreas: Unremarkable. No pancreatic ductal dilatation or surrounding inflammatory changes. Spleen: Normal in size without focal abnormality. Adrenals/Urinary Tract: Normal adrenal glands. Multiple bilateral cysts and subcentimeter (too small to reliably characterize) hypodense kidney lesions. Solid enhancing lesion arising off the posterior cortex of the interpolar left kidney measures 1.9 by 1.7 cm, image 69/15. Previously 1.4 x 0.9 cm. Punctate stone within interpolar collecting system of the right kidney is noted, image 84/5. No hydronephrosis identified bilaterally. Mild circumferential wall thickening of the bladder. Stomach/Bowel: Stomach is within normal limits. Appendix appears normal. No evidence of bowel wall thickening, distention, or inflammatory changes. Sigmoid diverticulosis without signs of acute inflammation. Vascular/Lymphatic: Aortic atherosclerosis. The infrarenal abdominal aorta measures 3 cm, image 93/15. No signs of abdominopelvic adenopathy. Reproductive: Prostate is unremarkable. Other: Fat containing left inguinal hernia. Small volume of perihepatic fluid and free fluid within the posterior pelvis noted. No focal fluid collections. Musculoskeletal: The bones appear diffusely osteopenic. No acute or suspicious osseous findings. Degenerative changes are noted involving the right hip. IMPRESSION: 1. Interval increase in size of solid enhancing lesion  arising off the posterior cortex of the interpolar left kidney concerning for renal cell carcinoma. 2. Multiple bilateral cysts and subcentimeter (too small to reliably characterize) hypodense kidney lesions. 3. Morphologic features of the liver suggestive of early cirrhosis. 4. Cardiac enlargement with evidence of right heart failure. 5. Small bilateral pleural effusions with increased interlobular septal thickening compatible  with CHF. 6. 3 cm infrarenal abdominal aortic aneurysm. Recommend follow-up every 3 years. Reference: J Am Coll Radiol 8206;01:561-537. 7. Aortic Atherosclerosis (ICD10-I70.0). Electronically Signed   By: Kerby Moors M.D.   On: 05/07/2021 07:52      ASSESSMENT & PLAN:  1. Kidney lesion    Left kidney lesion, slowly increasing size.  1.9 x 1.7 cm. With patient's advanced age, other comorbidities, he is not interested in surgery or biopsy. I will present patient's case on tumor board.  Consider active surveillance and/or thermal ablation  He agrees with the plan.Check CBC CMP LDH  Addendum, patient's case was presented on tumor board on 05/21/2021.   Size of the lesion has increased in the radiographic characteristics suggest primary kidney neoplasm.  Consensus reached upon complete staging and active surveillance.  Repeat CT scan in 6 months. I called Mrs. Belanger and communicated with her above.  She agrees with the plan and will relay information to patient. Orders Placed This Encounter  Procedures   CBC with Differential/Platelet    Standing Status:   Future    Number of Occurrences:   1    Standing Expiration Date:   05/18/2022   Comprehensive metabolic panel    Standing Status:   Future    Number of Occurrences:   1    Standing Expiration Date:   05/18/2022   Lactate dehydrogenase    Standing Status:   Future    Number of Occurrences:   1    Standing Expiration Date:   05/18/2022    All questions were answered. The patient knows to call the clinic with  any problems questions or concerns.  cc Royston Cowper, MD    Return of visit: 6 months Thank you for this kind referral and the opportunity to participate in the care of this patient. A copy of today's note is routed to referring provider    Earlie Server, MD, PhD Hematology Oncology [place] at [facility]  05/18/2021

## 2021-05-21 ENCOUNTER — Other Ambulatory Visit: Payer: Medicare Other

## 2021-05-21 NOTE — Progress Notes (Addendum)
Tumor Board Documentation  MAK BONNY was presented by Dr Tasia Catchings at our Tumor Board on 05/21/2021, which included representatives from medical oncology, radiation oncology, internal medicine, navigation, pathology, radiology, surgical, pharmacy, genetics, nutrition, research, palliative care, pulmonology.  Emerson currently presents for discussion of kidney lesions possible Renal Cell Carcinoma with history of the following treatments: imaging  Additionally, we reviewed previous medical and familial history, history of present illness, and recent lab results along with all available histopathologic and imaging studies. The tumor board considered available treatment options and made the following recommendations: Active Surveillance  The following procedures/referrals were also placed: No orders of the defined types were placed in this encounter.   Clinical Trial Status: not discussed   Staging used: AJCC Stage Group AJCC Staging:       Probable Renal Cell Carcinoma   National site-specific guidelines NCCN were discussed with respect to the case.  Tumor board is a meeting of clinicians from various specialty areas who evaluate and discuss patients for whom a multidisciplinary approach is being considered. Final determinations in the plan of care are those of the provider(s). The responsibility for follow up of recommendations given during tumor board is that of the provider.   Today's extended care, comprehensive team conference, Asaad was not present for the discussion and was not examined.   Multidisciplinary Tumor Board is a multidisciplinary case peer review process.  Decisions discussed in the Multidisciplinary Tumor Board reflect the opinions of the specialists present at the conference without having examined the patient.  Ultimately, treatment and diagnostic decisions rest with the primary provider(s) and the patient.

## 2021-05-26 ENCOUNTER — Telehealth: Payer: Self-pay

## 2021-05-26 ENCOUNTER — Other Ambulatory Visit: Payer: Self-pay

## 2021-05-26 DIAGNOSIS — N289 Disorder of kidney and ureter, unspecified: Secondary | ICD-10-CM

## 2021-05-26 NOTE — Telephone Encounter (Signed)
[place], please schedule patient for CT scan next available. Also labs with MD to follow 1-2 days after labs in 6 months. Orders are in. Thanks

## 2021-05-26 NOTE — Telephone Encounter (Signed)
-----   Message from Earlie Server, MD sent at 05/25/2021  4:05 PM EDT ----- Please arrange patient to have a CT chest without contrast- next available, work-up for kidney lesion, suspicious for kidney cancer.  His follow-up will be lab CBC CMP LDH in 6 months, CT abdomen pelvis with contrast and then see MD afterwards.

## 2021-05-26 NOTE — Telephone Encounter (Signed)
Appointments scheduled, tried to call patient to notify but unable to reach him or leave a message.  I will try back later.

## 2021-05-27 NOTE — Telephone Encounter (Signed)
Tried again to call patient but unable to leave a message.  Left VM with spouse to have him call me when he gets a chance.  I also sent an email to the patient requesting a call back.

## 2021-06-03 ENCOUNTER — Ambulatory Visit: Payer: Medicare Other

## 2021-06-08 ENCOUNTER — Inpatient Hospital Stay: Payer: Medicare Other | Admitting: Oncology

## 2021-06-26 ENCOUNTER — Ambulatory Visit
Admission: RE | Admit: 2021-06-26 | Discharge: 2021-06-26 | Disposition: A | Discharge status: Home / Self Care | Payer: Medicare Other | Source: Ambulatory Visit | Attending: Oncology | Admitting: Oncology

## 2021-06-26 ENCOUNTER — Other Ambulatory Visit: Payer: Self-pay

## 2021-06-26 DIAGNOSIS — N200 Calculus of kidney: Secondary | ICD-10-CM | POA: Diagnosis not present

## 2021-06-26 DIAGNOSIS — J90 Pleural effusion, not elsewhere classified: Secondary | ICD-10-CM | POA: Diagnosis not present

## 2021-06-26 DIAGNOSIS — I509 Heart failure, unspecified: Secondary | ICD-10-CM | POA: Diagnosis not present

## 2021-06-26 DIAGNOSIS — N289 Disorder of kidney and ureter, unspecified: Secondary | ICD-10-CM | POA: Insufficient documentation

## 2021-06-29 ENCOUNTER — Other Ambulatory Visit: Payer: Self-pay

## 2021-06-29 ENCOUNTER — Encounter: Payer: Self-pay | Admitting: Oncology

## 2021-06-29 ENCOUNTER — Inpatient Hospital Stay: Payer: Medicare Other | Attending: Oncology | Admitting: Oncology

## 2021-06-29 VITALS — BP 143/91 | HR 96 | Temp 96.9°F | Wt 151.0 lb

## 2021-06-29 DIAGNOSIS — N289 Disorder of kidney and ureter, unspecified: Secondary | ICD-10-CM | POA: Diagnosis not present

## 2021-06-29 DIAGNOSIS — Z7901 Long term (current) use of anticoagulants: Secondary | ICD-10-CM | POA: Insufficient documentation

## 2021-06-29 DIAGNOSIS — C642 Malignant neoplasm of left kidney, except renal pelvis: Secondary | ICD-10-CM | POA: Insufficient documentation

## 2021-06-29 NOTE — Progress Notes (Signed)
Marcus Kemp progress note [phone] [phone]   Patient Care Team: Idelle Crouch, MD as PCP - General (Internal Medicine)  REFERRING PROVIDER: Idelle Crouch, MD  CHIEF COMPLAINTS/REASON FOR VISIT:  Follow-up for renal mass.  HISTORY OF PRESENTING ILLNESS:   Marcus Kemp is a  87 y.o.  male with PMH listed below was seen in consultation at the request of  Idelle Crouch, MD  for evaluation of renal mass  Patient follows up with [facility] Dr. Eliberto Ivory for left inguinal hernia, left renal mass, BPH. 07/04/2020, patient had a CT hematuria work-up which showed bilateral nephrolithiasis without obstructive uropathy. Apically enhancing posterior interpolar left renal lesion consistent with renal cell carcinoma.  No metastatic disease or left renal vein involvement.  Tiny bilateral pleural effusion, elevated right heart pressure evidenced by reflux of interest into the IVC and hepatic veins.  Hiatal hernia. Patient is on surveillance . 05/05/2021, CT abdomen pelvis showed interval increase in the size of solid enhancing lesion arising off the posterior cortex of the interpolar left kidney concerning for renal cell carcinoma.  Multiple bilateral kidney cysts.  Morphologic features of liver suggestive of early cirrhosis.  Cardiac enlargement with evidence of right heart failure small bilateral pleural effusion.  3 cm infrarenal aortic aorta aneurysm  Patient was referred to establish care with oncology for evaluation.  He reports frequent urination and urgency likely secondary to BPH.  Has a history of rheumatic heart disease with a heart murmur.  He remains quite active for his age.  He does yard work and helps house chores.  Denies any cough, unintentional weight loss, fever, night sweats.  INTERVAL HISTORY Marcus Kemp is a 87 y.o. male who has above history reviewed by me today presents for follow up visit for management of renal  mass Patient's case was reviewed on tumor board.  Consensus recommendation was active surveillance.  Patient had staging CT scan done of the chest.  Presents to discuss management plan.  He was accompanied by his wife.    Review of Systems  Constitutional:  Negative for appetite change, chills, fatigue, fever and unexpected weight change.  HENT:   Negative for hearing loss and voice change.   Eyes:  Negative for eye problems and icterus.  Respiratory:  Negative for chest tightness, cough and shortness of breath.   Cardiovascular:  Negative for chest pain and leg swelling.  Gastrointestinal:  Negative for abdominal distention and abdominal pain.  Endocrine: Negative for hot flashes.  Genitourinary:  Positive for frequency and nocturia. Negative for difficulty urinating and dysuria.   Musculoskeletal:  Positive for arthralgias.  Skin:  Negative for itching and rash.  Neurological:  Negative for light-headedness and numbness.  Hematological:  Negative for adenopathy. Does not bruise/bleed easily.  Psychiatric/Behavioral:  Negative for confusion.    MEDICAL HISTORY:  Past Medical History:  Diagnosis Date   A-fib [facility])    CHF (congestive heart failure) (HCC)    Heart murmur    Hyperlipidemia     SURGICAL HISTORY: History reviewed. No pertinent surgical history.  SOCIAL HISTORY: Social History   Socioeconomic History   Marital status: Married    Spouse name: Not on file   Number of children: Not on file   Years of education: Not on file   Highest education level: Not on file  Occupational History   Not on file  Tobacco Use   Smoking status: Former    Packs/day: 0.25    Years: 8.00  Pack years: 2.00    Types: Cigarettes    Quit date: 60    Years since quitting: 82.9   Smokeless tobacco: Never  Vaping Use   Vaping Use: Never used  Substance and Sexual Activity   Alcohol use: No   Drug use: Never   Sexual activity: Not on file  Other Topics Concern   Not on file   Social History Narrative   Not on file   Social Determinants of Health   Financial Resource Strain: Not on file  Food Insecurity: Not on file  Transportation Needs: Not on file  Physical Activity: Not on file  Stress: Not on file  Social Connections: Not on file  Intimate Partner Violence: Not on file    FAMILY HISTORY: Family History  Problem Relation Age of Onset   Heart disease Sister    Hypertension Other     ALLERGIES:  is allergic to penicillins.  MEDICATIONS:  Current Outpatient Medications  Medication Sig Dispense Refill   apixaban (ELIQUIS) 2.5 MG TABS tablet Take 1 tablet by mouth 2 (two) times daily.     furosemide (LASIX) 20 MG tablet Take 1 tablet by mouth daily.     levocetirizine (XYZAL) 5 MG tablet Take 5 mg by mouth at bedtime as needed for allergies.     metoprolol succinate (TOPROL-XL) 25 MG 24 hr tablet Take 25 mg by mouth daily.     Multiple Vitamin (MULTI-VITAMINS) TABS Take 1 tablet by mouth daily.     acetaminophen (TYLENOL) 325 MG tablet Take 2 tablets (650 mg total) by mouth every 6 (six) hours as needed for mild pain (or Fever >/= 101). (Patient not taking: Reported on 05/18/2021)     morphine 2 MG/ML injection Inject 1 mL (2 mg total) into the vein every 4 (four) hours as needed. (Patient not taking: Reported on 05/18/2021) 1 mL 0   oxyCODONE (OXY IR/ROXICODONE) 5 MG immediate release tablet Take 1 tablet (5 mg total) by mouth every 4 (four) hours as needed for moderate pain. (Patient not taking: Reported on 05/18/2021) 30 tablet 0   No current facility-administered medications for this visit.     PHYSICAL EXAMINATION: ECOG PERFORMANCE STATUS: 1 - Symptomatic but completely ambulatory Vitals:   06/29/21 1438  BP: (!) 143/91  Pulse: 96  Temp: (!) 96.9 F (36.1 C)   Filed Weights   06/29/21 1438  Weight: 151 lb (68.5 kg)    Physical Exam Constitutional:      General: He is not in acute distress.    Comments: Patient ambulance  independently  HENT:     Head: Normocephalic and atraumatic.  Eyes:     General: No scleral icterus. Cardiovascular:     Rate and Rhythm: Normal rate and regular rhythm.     Heart sounds: Murmur heard.  Pulmonary:     Effort: Pulmonary effort is normal. No respiratory distress.     Breath sounds: No wheezing.  Abdominal:     General: Bowel sounds are normal. There is no distension.     Palpations: Abdomen is soft.  Musculoskeletal:        General: No deformity. Normal range of motion.     Cervical back: Normal range of motion and neck supple.  Skin:    General: Skin is warm and dry.     Findings: No erythema or rash.  Neurological:     Mental Status: He is alert and oriented to person, place, and time. Mental status is at baseline.  Cranial Nerves: No cranial nerve deficit.     Coordination: Coordination normal.  Psychiatric:        Mood and Affect: Mood normal.    LABORATORY DATA:  I have reviewed the data as listed Lab Results  Component Value Date   WBC 6.3 05/18/2021   HGB 13.7 05/18/2021   HCT 41.4 05/18/2021   MCV 107.8 (H) 05/18/2021   PLT 111 (L) 05/18/2021   Recent Labs    07/04/20 0927 05/05/21 1445 05/18/21 1554  NA  --   --  137  K  --   --  5.0  CL  --   --  104  CO2  --   --  27  GLUCOSE  --   --  94  BUN  --   --  30*  CREATININE 1.10 1.10 1.23  CALCIUM  --   --  9.1  GFRNONAA  --   --  55*  PROT  --   --  7.3  ALBUMIN  --   --  4.1  AST  --   --  35  ALT  --   --  24  ALKPHOS  --   --  97  BILITOT  --   --  2.2*    Iron/TIBC/Ferritin/ %Sat No results found for: IRON, TIBC, FERRITIN, IRONPCTSAT    RADIOGRAPHIC STUDIES: I have personally reviewed the radiological images as listed and agreed with the findings in the report. CT Chest Wo Contrast  Result Date: 06/29/2021 CLINICAL DATA:  Renal cell carcinoma diagnosed in October.  Staging. EXAM: CT CHEST WITHOUT CONTRAST TECHNIQUE: Multidetector CT imaging of the chest was performed  following the standard protocol without IV contrast. COMPARISON:  12/18/2020 chest radiograph, report only. FINDINGS: Cardiovascular: Aortic atherosclerosis. Tortuous thoracic aorta. Moderate cardiomegaly with biatrial enlargement. Pulmonary artery enlargement, outflow tract 3.8 cm. Left main, LAD, right coronary artery calcification. Mediastinum/Nodes: Small left jugular/low cervical nodes are not pathologic by size criteria at up to 7 mm. No mediastinal or definite hilar adenopathy, given limitations of unenhanced CT. Lungs/Pleura: Small bilateral pleural effusions. Bibasilar mild septal thickening. Concurrent mild subpleural interstitial thickening without craniocaudal gradient. No suspicious pulmonary nodule or mass. Upper Abdomen: Subcentimeter right hepatic lobe low-density lesion is likely a cyst. Caudate lobe enlargement again identified. Normal imaged portions of the spleen, stomach, pancreas, gallbladder, adrenal glands. Bilateral renal collecting system calculi, including at up to 4 mm in the interpolar right kidney. Upper pole left renal 1.9 cm cyst. Abdominal aortic atherosclerosis. Musculoskeletal: Remote left rib fractures. IMPRESSION: 1. No evidence of metastatic disease within the chest. 2. Congestive heart failure, with bilateral pleural effusions and basilar predominant septal thickening. 3. Concurrent subpleural interstitial thickening is suspicious for mild interstitial lung disease, nonspecific and of doubtful clinical significance given patient age. 4. Bilateral nephrolithiasis. 5. Pulmonary artery enlargement suggests pulmonary arterial hypertension. Electronically Signed   By: Abigail Miyamoto M.D.   On: 06/29/2021 09:53      ASSESSMENT & PLAN:  1. Kidney lesion    Left kidney lesion, slowly increasing size.  1.9 x 1.7 cm. With patient's advanced age, other comorbidities, he is not interested in surgery or biopsy. Patient's case was reviewed and discussed on multidisciplinary tumor  board and the consensus reached upon  Size of the lesion has increased in the radiographic characteristics suggest primary kidney neoplasm.  Consensus reached upon complete staging and active surveillance.  06/26/2021 CT chest without contrast was reviewed by me and discussed with patient.  There was no evidence of metastatic disease within the chest.  Other findings including CHF, subpleural interstitial thickening, bilateral nephrolithiasis and pulmonary artery enlargement were discussed with patient.  I will repeat a CT scan in 6 months.  Patient agrees with the plan.   Orders Placed This Encounter  Procedures   CT Abdomen Pelvis W Wo Contrast    Standing Status:   Future    Standing Expiration Date:   06/29/2022    Order Specific Question:   If indicated for the ordered procedure, I authorize the administration of contrast media per Radiology protocol    Answer:   Yes    Order Specific Question:   Preferred imaging location?    Answer:   [facility]    Order Specific Question:   Is Oral Contrast requested for this exam?    Answer:   Yes, Per Radiology protocol   CBC with Differential/Platelet    Standing Status:   Future    Standing Expiration Date:   06/29/2022   Comprehensive metabolic panel    Standing Status:   Future    Standing Expiration Date:   06/29/2022   Lactate dehydrogenase    Standing Status:   Future    Standing Expiration Date:   06/29/2022    All questions were answered. The patient knows to call the clinic with any problems questions or concerns.  cc Idelle Crouch, MD    Return of visit: 6 months   Earlie Server, MD, PhD 06/29/2021

## 2021-11-20 ENCOUNTER — Telehealth: Payer: Self-pay | Admitting: Oncology

## 2021-11-20 ENCOUNTER — Telehealth: Payer: Self-pay

## 2021-11-20 NOTE — Telephone Encounter (Signed)
Created in error

## 2021-11-20 NOTE — Telephone Encounter (Signed)
Nena Polio, would you make sure labs and MD visit are rescheduled as well. Thanks  ?

## 2021-11-20 NOTE — Telephone Encounter (Signed)
Per wrap in December, patient to be scheduled for Labs/CT in 6 months with MD 2 days after.Patient is scheduled for labs/CT on 5/1. This is not quite 6 months. Pls r/s patient for labs/CT in beginning of June and MD 2 days after. ?

## 2021-11-23 ENCOUNTER — Inpatient Hospital Stay: Payer: Medicare Other

## 2021-11-23 ENCOUNTER — Ambulatory Visit: Admission: RE | Admit: 2021-11-23 | Payer: Medicare Other | Source: Ambulatory Visit

## 2021-11-23 ENCOUNTER — Other Ambulatory Visit: Payer: Medicare Other

## 2021-11-25 ENCOUNTER — Inpatient Hospital Stay: Payer: Medicare Other | Admitting: Oncology

## 2021-12-24 ENCOUNTER — Ambulatory Visit
Admission: RE | Admit: 2021-12-24 | Discharge: 2021-12-24 | Disposition: A | Discharge status: Home / Self Care | Payer: Medicare Other | Source: Ambulatory Visit | Attending: Oncology | Admitting: Oncology

## 2021-12-24 DIAGNOSIS — N289 Disorder of kidney and ureter, unspecified: Secondary | ICD-10-CM | POA: Insufficient documentation

## 2021-12-24 LAB — POCT I-STAT CREATININE: Creatinine, Ser: 1.2 mg/dL (ref 0.61–1.24)

## 2021-12-24 MED ORDER — IOHEXOL 300 MG/ML  SOLN
80.0000 mL | Freq: Once | INTRAMUSCULAR | Status: AC | PRN
  Administered 2021-12-24: 80 mL via INTRAVENOUS

## 2021-12-28 ENCOUNTER — Inpatient Hospital Stay: Payer: Medicare Other | Admitting: Oncology

## 2021-12-28 ENCOUNTER — Inpatient Hospital Stay: Payer: Medicare Other

## 2021-12-29 ENCOUNTER — Inpatient Hospital Stay (HOSPITAL_BASED_OUTPATIENT_CLINIC_OR_DEPARTMENT_OTHER): Payer: Medicare Other | Admitting: Oncology

## 2021-12-29 ENCOUNTER — Other Ambulatory Visit: Payer: Self-pay

## 2021-12-29 ENCOUNTER — Inpatient Hospital Stay: Payer: Medicare Other | Attending: Oncology

## 2021-12-29 ENCOUNTER — Encounter: Payer: Self-pay | Admitting: Oncology

## 2021-12-29 VITALS — BP 123/65 | HR 51 | Temp 95.5°F | Wt 141.0 lb

## 2021-12-29 DIAGNOSIS — N289 Disorder of kidney and ureter, unspecified: Secondary | ICD-10-CM

## 2021-12-29 DIAGNOSIS — Z87891 Personal history of nicotine dependence: Secondary | ICD-10-CM | POA: Diagnosis not present

## 2021-12-29 DIAGNOSIS — D539 Nutritional anemia, unspecified: Secondary | ICD-10-CM

## 2021-12-29 DIAGNOSIS — C642 Malignant neoplasm of left kidney, except renal pelvis: Secondary | ICD-10-CM | POA: Insufficient documentation

## 2021-12-29 DIAGNOSIS — Z7901 Long term (current) use of anticoagulants: Secondary | ICD-10-CM | POA: Insufficient documentation

## 2021-12-29 LAB — CBC WITH DIFFERENTIAL/PLATELET
Abs Immature Granulocytes: 0.03 10*3/uL (ref 0.00–0.07)
Basophils Absolute: 0 10*3/uL (ref 0.0–0.1)
Basophils Relative: 1 %
Eosinophils Absolute: 0.1 10*3/uL (ref 0.0–0.5)
Eosinophils Relative: 2 %
HCT: 38.8 % — ABNORMAL LOW (ref 39.0–52.0)
Hemoglobin: 12.6 g/dL — ABNORMAL LOW (ref 13.0–17.0)
Immature Granulocytes: 1 %
Lymphocytes Relative: 34 %
Lymphs Abs: 1.7 10*3/uL (ref 0.7–4.0)
MCH: 35.8 pg — ABNORMAL HIGH (ref 26.0–34.0)
MCHC: 32.5 g/dL (ref 30.0–36.0)
MCV: 110.2 fL — ABNORMAL HIGH (ref 80.0–100.0)
Monocytes Absolute: 0.5 10*3/uL (ref 0.1–1.0)
Monocytes Relative: 10 %
Neutro Abs: 2.6 10*3/uL (ref 1.7–7.7)
Neutrophils Relative %: 52 %
Platelets: 131 10*3/uL — ABNORMAL LOW (ref 150–400)
RBC: 3.52 MIL/uL — ABNORMAL LOW (ref 4.22–5.81)
RDW: 16.5 % — ABNORMAL HIGH (ref 11.5–15.5)
WBC: 5 10*3/uL (ref 4.0–10.5)
nRBC: 0 % (ref 0.0–0.2)

## 2021-12-29 LAB — COMPREHENSIVE METABOLIC PANEL
ALT: 26 U/L (ref 0–44)
AST: 31 U/L (ref 15–41)
Albumin: 3.5 g/dL (ref 3.5–5.0)
Alkaline Phosphatase: 111 U/L (ref 38–126)
Anion gap: 7 (ref 5–15)
BUN: 32 mg/dL — ABNORMAL HIGH (ref 8–23)
CO2: 26 mmol/L (ref 22–32)
Calcium: 8.6 mg/dL — ABNORMAL LOW (ref 8.9–10.3)
Chloride: 105 mmol/L (ref 98–111)
Creatinine, Ser: 1.21 mg/dL (ref 0.61–1.24)
GFR, Estimated: 55 mL/min — ABNORMAL LOW (ref >60)
Glucose, Bld: 116 mg/dL — ABNORMAL HIGH (ref 70–99)
Potassium: 4.6 mmol/L (ref 3.5–5.1)
Sodium: 138 mmol/L (ref 135–145)
Total Bilirubin: 1.2 mg/dL (ref 0.3–1.2)
Total Protein: 6.9 g/dL (ref 6.5–8.1)

## 2021-12-29 LAB — LACTATE DEHYDROGENASE: LDH: 237 U/L — ABNORMAL HIGH (ref 98–192)

## 2021-12-29 LAB — FOLATE: Folate: 23 ng/mL (ref >5.9)

## 2021-12-29 MED ORDER — CYANOCOBALAMIN 500 MCG PO TABS: 500.0000 ug | ORAL_TABLET | Freq: Every day | ORAL | 3 refills | Status: AC

## 2021-12-29 NOTE — Progress Notes (Signed)
Hematology/Oncology progress note [phone] [phone]   Patient Care Team: Idelle Crouch, MD as PCP - General (Internal Medicine)  REFERRING PROVIDER: Idelle Crouch, MD  CHIEF COMPLAINTS/REASON FOR VISIT:  Follow-up for renal mass.  HISTORY OF PRESENTING ILLNESS:   Marcus Kemp is a  88 y.o.  male with PMH listed below was seen in consultation at the request of  Idelle Crouch, MD  for evaluation of renal mass  Patient follows up with [facility] Dr. Eliberto Ivory for left inguinal hernia, left renal mass, BPH. 07/04/2020, patient had a CT hematuria work-up which showed bilateral nephrolithiasis without obstructive uropathy. Apically enhancing posterior interpolar left renal lesion consistent with renal cell carcinoma.  No metastatic disease or left renal vein involvement.  Tiny bilateral pleural effusion, elevated right heart pressure evidenced by reflux of interest into the IVC and hepatic veins.  Hiatal hernia. Patient is on surveillance . 05/05/2021, CT abdomen pelvis showed interval increase in the size of solid enhancing lesion arising off the posterior cortex of the interpolar left kidney concerning for renal cell carcinoma.  Multiple bilateral kidney cysts.  Morphologic features of liver suggestive of early cirrhosis.  Cardiac enlargement with evidence of right heart failure small bilateral pleural effusion.  3 cm infrarenal aortic aorta aneurysm  Patient was referred to establish care with oncology for evaluation.  He reports frequent urination and urgency likely secondary to BPH.  Has a history of rheumatic heart disease with a heart murmur.  He remains quite active for his age.  He does yard work and helps house chores.  Denies any cough, unintentional weight loss, fever, night sweats.  Patient's case was reviewed on tumor board.  Consensus recommendation was active surveillance.  Patient had staging CT scan done of the chest.  06/26/2021 CT  chest without contrast was reviewed by me and discussed with patient.  There was no evidence of metastatic disease within the chest.  Other findings including CHF, subpleural interstitial thickening, bilateral nephrolithiasis and pulmonary artery enlargement were discussed with patient.    INTERVAL HISTORY Marcus Kemp is a 88 y.o. male who has above history reviewed by me today presents for follow up visit for management of renal mass  He was accompanied by his wife.  Patient reports no unintentional weight loss, night sweats, fever, shortness of breath, cough, abdominal pain.    Review of Systems  Constitutional:  Negative for appetite change, chills, fatigue, fever and unexpected weight change.  HENT:   Negative for hearing loss and voice change.   Eyes:  Negative for eye problems and icterus.  Respiratory:  Negative for chest tightness, cough and shortness of breath.   Cardiovascular:  Negative for chest pain and leg swelling.  Gastrointestinal:  Negative for abdominal distention and abdominal pain.  Endocrine: Negative for hot flashes.  Genitourinary:  Positive for frequency and nocturia. Negative for difficulty urinating and dysuria.   Musculoskeletal:  Positive for arthralgias.  Skin:  Negative for itching and rash.  Neurological:  Negative for light-headedness and numbness.  Hematological:  Negative for adenopathy. Does not bruise/bleed easily.  Psychiatric/Behavioral:  Negative for confusion.    MEDICAL HISTORY:  Past Medical History:  Diagnosis Date   A-fib [facility])    CHF (congestive heart failure) (HCC)    Heart murmur    Hyperlipidemia     SURGICAL HISTORY: History reviewed. No pertinent surgical history.  SOCIAL HISTORY: Social History   Socioeconomic History   Marital status: Married  Spouse name: Not on file   Number of children: Not on file   Years of education: Not on file   Highest education level: Not on file  Occupational History   Not on file   Tobacco Use   Smoking status: Former    Packs/day: 0.25    Years: 8.00    Pack years: 2.00    Types: Cigarettes    Quit date: 75    Years since quitting: 83.4   Smokeless tobacco: Never  Vaping Use   Vaping Use: Never used  Substance and Sexual Activity   Alcohol use: No   Drug use: Never   Sexual activity: Not on file  Other Topics Concern   Not on file  Social History Narrative   Not on file   Social Determinants of Health   Financial Resource Strain: Not on file  Food Insecurity: Not on file  Transportation Needs: Not on file  Physical Activity: Not on file  Stress: Not on file  Social Connections: Not on file  Intimate Partner Violence: Not on file    FAMILY HISTORY: Family History  Problem Relation Age of Onset   Heart disease Sister    Hypertension Other     ALLERGIES:  is allergic to penicillins.  MEDICATIONS:  Current Outpatient Medications  Medication Sig Dispense Refill   apixaban (ELIQUIS) 2.5 MG TABS tablet Take 1 tablet by mouth 2 (two) times daily.     furosemide (LASIX) 20 MG tablet Take 1 tablet by mouth daily.     levocetirizine (XYZAL) 5 MG tablet Take 5 mg by mouth at bedtime as needed for allergies.     metoprolol succinate (TOPROL-XL) 25 MG 24 hr tablet Take 25 mg by mouth daily.     Multiple Vitamin (MULTI-VITAMINS) TABS Take 1 tablet by mouth daily.     vitamin B-12 (CYANOCOBALAMIN) 500 MCG tablet Take 1 tablet (500 mcg total) by mouth daily. 90 tablet 3   acetaminophen (TYLENOL) 325 MG tablet Take 2 tablets (650 mg total) by mouth every 6 (six) hours as needed for mild pain (or Fever >/= 101). (Patient not taking: Reported on 05/18/2021)     morphine 2 MG/ML injection Inject 1 mL (2 mg total) into the vein every 4 (four) hours as needed. (Patient not taking: Reported on 05/18/2021) 1 mL 0   oxyCODONE (OXY IR/ROXICODONE) 5 MG immediate release tablet Take 1 tablet (5 mg total) by mouth every 4 (four) hours as needed for moderate pain.  (Patient not taking: Reported on 05/18/2021) 30 tablet 0   No current facility-administered medications for this visit.     PHYSICAL EXAMINATION: ECOG PERFORMANCE STATUS: 1 - Symptomatic but completely ambulatory Vitals:   12/29/21 1003  BP: 123/65  Pulse: (!) 51  Temp: (!) 95.5 F (35.3 C)   Filed Weights   12/29/21 1003  Weight: 141 lb (64 kg)    Physical Exam Constitutional:      General: He is not in acute distress.    Comments: Patient ambulance independently  HENT:     Head: Normocephalic and atraumatic.  Eyes:     General: No scleral icterus. Cardiovascular:     Rate and Rhythm: Normal rate and regular rhythm.     Heart sounds: Murmur heard.  Pulmonary:     Effort: Pulmonary effort is normal. No respiratory distress.     Breath sounds: No wheezing.  Abdominal:     General: Bowel sounds are normal. There is no distension.  Palpations: Abdomen is soft.  Musculoskeletal:        General: No deformity. Normal range of motion.     Cervical back: Normal range of motion and neck supple.  Skin:    General: Skin is warm and dry.     Findings: No erythema or rash.  Neurological:     Mental Status: He is alert and oriented to person, place, and time. Mental status is at baseline.     Cranial Nerves: No cranial nerve deficit.     Coordination: Coordination normal.  Psychiatric:        Mood and Affect: Mood normal.    LABORATORY DATA:  I have reviewed the data as listed Lab Results  Component Value Date   WBC 5.0 12/29/2021   HGB 12.6 (L) 12/29/2021   HCT 38.8 (L) 12/29/2021   MCV 110.2 (H) 12/29/2021   PLT 131 (L) 12/29/2021   Recent Labs    05/18/21 1554 12/24/21 1133 12/29/21 0946  NA 137  --  138  K 5.0  --  4.6  CL 104  --  105  CO2 27  --  26  GLUCOSE 94  --  116*  BUN 30*  --  32*  CREATININE 1.23 1.20 1.21  CALCIUM 9.1  --  8.6*  GFRNONAA 55*  --  55*  PROT 7.3  --  6.9  ALBUMIN 4.1  --  3.5  AST 35  --  31  ALT 24  --  26  ALKPHOS 97   --  111  BILITOT 2.2*  --  1.2    Iron/TIBC/Ferritin/ %Sat No results found for: IRON, TIBC, FERRITIN, IRONPCTSAT    RADIOGRAPHIC STUDIES: I have personally reviewed the radiological images as listed and agreed with the findings in the report. CT Abdomen Pelvis W Wo Contrast  Result Date: 12/25/2021 CLINICAL DATA:  Follow-up enhancing left renal lesion. * Tracking Code: BO * EXAM: CT ABDOMEN AND PELVIS WITHOUT AND WITH CONTRAST TECHNIQUE: Multidetector CT imaging of the abdomen and pelvis was performed following the standard protocol before and following the bolus administration of intravenous contrast. RADIATION DOSE REDUCTION: This exam was performed according to the departmental dose-optimization program which includes automated exposure control, adjustment of the mA and/or kV according to patient size and/or use of iterative reconstruction technique. CONTRAST:  12m OMNIPAQUE IOHEXOL 300 MG/ML  SOLN COMPARISON:  Multiple priors including most recent CT May 05, 2021. FINDINGS: Lower chest: Cardiomegaly. Trace right-greater-than-left pleural effusions with bibasilar interlobular septal thickening. Hepatobiliary: Findings again suggestive of congestive hepatopathy. Slight contour nodularity with relative hypertrophy of the caudate lobe and lateral left lobe of the liver, similar prior. No suspicious hepatic lesion. Cholelithiasis without findings of acute cholecystitis. No biliary ductal dilation. Pancreas: No pancreatic ductal dilation or evidence of acute inflammation. Spleen: No splenomegaly or focal splenic lesion. Adrenals/Urinary Tract: Bilateral adrenal glands appear normal. Multiple bilateral renal cysts and subcentimeter hypodense renal lesions which are technically too small to accurately characterize but statistically likely to reflect cysts are stable from prior. Unchanged size of the 1.9 x 1.7 cm solid enhancing left interpolar renal lesion on image 68/16. No hydronephrosis.  Nonobstructive bilateral renal stones. Kidneys demonstrate symmetric enhancement and excretion of contrast material. Mild symmetric wall thickening of an incompletely distended urinary bladder. Stomach/Bowel: Radiopaque enteric contrast material traverses the splenic flexure. Reflux versus retained contrast in the esophagus. Stomach is unremarkable for degree of distension. No pathologic dilation of small or large bowel. The appendix and terminal ileum  appear normal. Moderate volume of formed stool throughout the colon suggestive of constipation. Sigmoid colonic diverticulosis without findings of acute diverticulitis. Vascular/Lymphatic: Aortic atherosclerosis with aneurysmal dilation of the infrarenal abdominal aorta measuring 3 cm, unchanged. Evidence of renal tumor in vein. No pathologically enlarged abdominal or pelvic lymph nodes. Reproductive: Prostate is unremarkable. Other: Fat and fluid containing left inguinal hernia. Trace abdominopelvic free fluid. Musculoskeletal: Diffuse demineralization of bone. Aggressive lytic or blastic lesion of bone. Multilevel degenerative changes of the spine. Degenerative changes of the bilateral hips. IMPRESSION: 1. Stable solid enhancing 1.9 cm left interpolar renal lesion most consistent with renal cell carcinoma. 2. No evidence of renal tumor in vein or abdominopelvic metastatic disease. 3. Stable benign bilateral renal cysts and subcentimeter renal lesions technically too small to accurately characterize but statistically likely to reflect cysts. 4. Congestive hepatopathy with morphologic hepatic changes suggestive of cirrhosis. 5. Cardiac enlargement with mild pulmonary edema. 6. Infrarenal abdominal aortic aneurysm measuring 3 cm, unchanged. Attention on active oncologic surveillance imaging in this patient is suggested. Reference: J Am Coll Radiol 6295;28:413-244. 7.  Aortic Atherosclerosis (ICD10-I70.0). Electronically Signed   By: Dahlia Bailiff M.D.   On: 12/25/2021  10:44      ASSESSMENT & PLAN:  1. Kidney lesion   2. Macrocytic anemia    Left kidney lesion, slowly increasing size.  1.9 x 1.7 cm. With patient's advanced age, other comorbidities, he is not interested in surgery or biopsy. 12/24/2021, CT chest abdomen pelvis with and without contrast showed stable solid enhancing 1.9 cm left interpolar renal lesion most consistent with renal cell carcinoma.  No evidence of renal tumor in vein or abdominal pelvic or metastatic disease.  Stable benign bilateral kidney cyst.  Subcentimeter renal lesion technically too small to accurately characterize.  Congestive hepatopathy with morphology hepatic changes suggestive of cirrhosis.  Cardiac enlargement with mild pulmonary edema.  Infrarenal abdominal aortic aneurysm measuring 3 cm.  Aortic atherosclerosis. Findings were reviewed and discussed with patient Stable kidney lesion.  Continue surveillance.  Recommend CT scan in 1 year.  Patient agrees with the plan.  #Mild anemia with hemoglobin 12.6, macrocytic.  Folic acid level is normal.  I recommend patient to take empiric vitamin B12 500 mcg daily supplementation.  Prescription sent to pharmacy.  Check vitamin B12 at the next visit.  Increased LDH likely due to increased RBC turnover secondary to destruction from heart valve disease or presumed kidney cancer..   Orders Placed This Encounter  Procedures   CT Abdomen Pelvis W Contrast    Standing Status:   Future    Standing Expiration Date:   12/30/2022    Order Specific Question:   If indicated for the ordered procedure, I authorize the administration of contrast media per Radiology protocol    Answer:   Yes    Order Specific Question:   Preferred imaging location?    Answer:   [facility]    Order Specific Question:   Is Oral Contrast requested for this exam?    Answer:   Yes, Per Radiology protocol   Folate    Standing Status:   Future    Number of Occurrences:   1    Standing Expiration Date:    12/29/2022   Vitamin B12    Standing Status:   Future    Standing Expiration Date:   12/30/2022   CBC with Differential/Platelet    Standing Status:   Future    Standing Expiration Date:   12/30/2022   Comprehensive  metabolic panel    Standing Status:   Future    Standing Expiration Date:   12/30/2022   Vitamin B12    Standing Status:   Future    Standing Expiration Date:   12/30/2022   Folate    Standing Status:   Future    Standing Expiration Date:   12/30/2022    All questions were answered. The patient knows to call the clinic with any problems questions or concerns.  cc Idelle Crouch, MD    Return of visit: 12 months   Earlie Server, MD, PhD 12/29/2021

## 2022-01-08 ENCOUNTER — Encounter
Admission: RE | Admit: 2022-01-08 | Discharge: 2022-01-08 | Disposition: A | Discharge status: Home / Self Care | Payer: Medicare Other | Source: Ambulatory Visit | Attending: General Surgery | Admitting: General Surgery

## 2022-01-08 ENCOUNTER — Other Ambulatory Visit: Payer: Self-pay | Admitting: General Surgery

## 2022-01-08 HISTORY — DX: Unspecified hearing loss, unspecified ear: H91.90

## 2022-01-08 HISTORY — DX: Presence of external hearing-aid: Z97.4

## 2022-01-08 HISTORY — DX: Edema, unspecified: R60.9

## 2022-01-08 HISTORY — DX: Nonrheumatic mitral (valve) insufficiency: I34.0

## 2022-01-08 HISTORY — DX: Pneumonia, unspecified organism: J18.9

## 2022-01-08 HISTORY — DX: Personal history of urinary calculi: Z87.442

## 2022-01-08 HISTORY — DX: Nonrheumatic mitral (valve) prolapse: I34.1

## 2022-01-08 HISTORY — DX: Localized edema: R60.0

## 2022-01-08 HISTORY — DX: Anemia, unspecified: D64.9

## 2022-01-08 HISTORY — DX: Rheumatic fever without heart involvement: I00

## 2022-01-08 HISTORY — DX: Gastro-esophageal reflux disease without esophagitis: K21.9

## 2022-01-08 HISTORY — DX: Sepsis, unspecified organism: A41.9

## 2022-01-08 HISTORY — DX: Unspecified osteoarthritis, unspecified site: M19.90

## 2022-01-08 HISTORY — DX: Malignant neoplasm of unspecified kidney, except renal pelvis: C64.9

## 2022-01-08 MED ORDER — VANCOMYCIN HCL 10 G IV SOLR: 1000.0000 mg | Freq: Once | INTRAVENOUS | Status: DC

## 2022-01-08 MED ORDER — GENTAMICIN SULFATE 40 MG/ML IJ SOLN: 5.0000 mg/kg | INTRAVENOUS | Status: DC

## 2022-01-08 NOTE — Progress Notes (Signed)
Progress Notes - documented in this encounter Shondrika Hoque, Geronimo Boot, MD - 01/07/2022 9:45 AM EDT Formatting of this note is different from the original. Subjective:   Patient ID: Marcus Kemp is a 88 y.o. male.  HPI  The following portions of the patient's history were reviewed and updated as appropriate.  This is a new patient that is here today for: office visit. Patient has been referred by Dr. Doy Hutching for evaluation of a left inguinal hernia. He reports he has had it for one year. Patient reports his pain is about a 6 on the pain scale when he is sitting. He reports when he is standing or laying the pain is minimal. Around 3 weeks ago the patient reports the hernia doubled in size. He states he is avoiding watering his his tomato plants due to the discomfort because he has to lift a heavy bucket. Also, concerned if he should be riding his lawn mower. Patient reports bowel movements daily.   No change in nocturia x2.  The patient remains very active and having been slowed down by the groin discomfort and warnings about undue activity being housebound has been difficult.  The patient has been followed for a small renal cell carcinoma that has shown minimal enlargement over the last year.  He reports being told he had a small hernia over a year ago, at the time of his diagnosis with cancer when being seen by Maryan Puls, MD. He really had not been too aware of it until recently.  The patient is accompanied by his wife, Marcus Kemp, of almost 71 years. Their anniversary is this weekend.  Chief Complaint  Patient presents with  Inguinal Hernia    BP 110/72  Pulse 72  Temp 36.4 C (97.5 F)  Ht 162.6 cm ('5\' 4"'$ )  Wt 65.3 kg (144 lb)  BMI 24.72 kg/m   Weight June 29, 2021 at medical oncology: 151 pounds.  Past Medical History:  Diagnosis Date  Anemia  Atrial fibrillation (CMS-HCC)  Cardiac murmur  CHF (congestive heart failure) (CMS-HCC)  Hyperlipidemia  Mitral  regurgitation  mild to moderate  MVP (mitral valve prolapse)  Nephrolithiasis  Rheumatic fever  as a child    Past Surgical History:  Procedure Laterality Date  catheter ablation 04/2016  LITHOTRIPSY  TONSILLECTOMY    Social History   Socioeconomic History  Marital status: Married  Tobacco Use  Smoking status: Former  Packs/day: 0.25  Years: 8.00  Pack years: 2.00  Types: Cigarettes  Quit date: 07/26/1938  Years since quitting: 83.5  Smokeless tobacco: Never  Vaping Use  Vaping Use: Never used  Substance and Sexual Activity  Alcohol use: No    Allergies  Allergen Reactions  Penicillin Anaphylaxis   Current Outpatient Medications  Medication Sig Dispense Refill  cetirizine (ZYRTEC) 10 MG tablet TAKE 1 PILL ONCE DAILY AS NEEDED  ELIQUIS 2.5 mg tablet TAKE 1 TABLET BY MOUTH TWICE A DAY 180 tablet 3  FUROsemide (LASIX) 20 MG tablet Take 1 tablet by mouth once daily as needed for feet/leg swelling. 90 tablet 3  levocetirizine (XYZAL) 5 MG tablet Take 5 mg by mouth every evening  metoprolol succinate (TOPROL-XL) 25 MG XL tablet TAKE 1 TABLET BY MOUTH ONCE DAILY 90 tablet 3  NON FORMULARY Relaxium   No current facility-administered medications for this visit.   Family History  Problem Relation Age of Onset  Heart disease Sister   Labs and Radiology:   December 29, 2021 laboratory:  WBC 4.0 - 10.5  K/uL 5.0  RBC 4.22 - 5.81 MIL/uL 3.52 Low  Hemoglobin 13.0 - 17.0 g/dL 12.6 Low  HCT 39.0 - 52.0 % 38.8 Low  MCV 80.0 - 100.0 fL 110.2 High  MCH 26.0 - 34.0 pg 35.8 High  MCHC 30.0 - 36.0 g/dL 32.5  RDW 11.5 - 15.5 % 16.5 High  Platelets 150 - 400 K/uL 131 Low  nRBC 0.0 - 0.2 % 0.0  Neutrophils Relative % % 52  Neutro Abs 1.7 - 7.7 K/uL 2.6  Lymphocytes Relative % 34  Lymphs Abs 0.7 - 4.0 K/uL 1.7  Monocytes Relative % 10  Monocytes Absolute 0.1 - 1.0 K/uL 0.5  Eosinophils Relative % 2  Eosinophils Absolute 0.0 - 0.5 K/uL 0.1  Basophils Relative % 1  Basophils  Absolute 0.0 - 0.1 K/uL 0.0  Immature Granulocytes % 1  Abs Immature Granulocytes 0.00 - 0.07 K/uL 0.03   Sodium 135 - 145 mmol/L 138  Potassium 3.5 - 5.1 mmol/L 4.6  Chloride 98 - 111 mmol/L 105  CO2 22 - 32 mmol/L 26  Glucose, Bld 70 - 99 mg/dL 116 High  Comment: Glucose reference range applies only to samples taken after fasting for at least 8 hours.  BUN 8 - 23 mg/dL 32 High  Creatinine, Ser 0.61 - 1.24 mg/dL 1.21  Calcium 8.9 - 10.3 mg/dL 8.6 Low  Total Protein 6.5 - 8.1 g/dL 6.9  Albumin 3.5 - 5.0 g/dL 3.5  AST 15 - 41 U/L 31  ALT 0 - 44 U/L 26  Alkaline Phosphatase 38 - 126 U/L 111  Total Bilirubin 0.3 - 1.2 mg/dL 1.2  GFR, Estimated >60 mL/min 55 Low  Comment: (NOTE)  Calculated using the CKD-EPI Creatinine Equation (2021)  Anion gap 5 - 15 7   Imaging review:  CT scans of the abdomen and pelvis from July 04, 2020 through December 24, 2021 were independently reviewed. Notable left inguinal hernia with fat. No clear increase in diameter of the fascial defect. Patent vessels in the mesentery. Small left renal tumor. Diverticulosis. No evidence of bowel dilatation or clear-cut intraluminal filling defect. Cardiomegaly, coronary calcification.  Review of Systems  Constitutional: Negative for chills and fever.  Respiratory: Negative for cough.    Objective:  Physical Exam Constitutional:  Appearance: Normal appearance.  Cardiovascular:  Rate and Rhythm: Normal rate. Rhythm irregularly irregular.  Pulses: Normal pulses.  Heart sounds: Normal heart sounds.  Pulmonary:  Effort: Pulmonary effort is normal.  Breath sounds: Normal breath sounds.  Abdominal:  General: Abdomen is flat. Bowel sounds are normal.  Palpations: Abdomen is soft.  Tenderness: There is no abdominal tenderness.  Hernia: A hernia is present. Hernia is present in the left inguinal area (Baseball size mass that is moderately difficult to reduce in the supine position.). There is no hernia in the right  inguinal area.  Genitourinary: Testes: Normal.  Musculoskeletal:  Cervical back: Neck supple.  Neurological:  Mental Status: He is alert and oriented to person, place, and time.  Psychiatric:  Mood and Affect: Mood normal.  Behavior: Behavior normal.    Assessment:   Symptomatic left inguinal hernia.  Anticoagulation for atrial fibrillation.  Plan:   This is a high risk patient for surgery, but his hernia is difficult to reduce under the best of circumstances, and with his 3-week history of increasing symptoms, he may be at high risk for incarceration.  Secure chat with his cardiologist was completed. The approval to hold his Eliquis x2 days prior to the procedure with  resumption as soon as possible afterward.  Role of prosthetic mesh and repair was reviewed.  At this time, we will proceed to early operative intervention.   This note is partially prepared by Ledell Noss, CMA acting as a scribe in the presence of Dr. Hervey Ard, MD.   The documentation recorded by the scribe accurately reflects the service I personally performed and the decisions made by me.   Robert Bellow, MD FACS  Electronically signed by Mayer Masker, MD at 01/08/2022 12:24 PM EDT

## 2022-01-08 NOTE — Patient Instructions (Addendum)
Your procedure is scheduled on:01-13-22 Wednesday Report to the Registration Desk on the 1st floor of the [facility].Then proceed to the 2nd floor Surgery Desk To find out your arrival time, please call [phone] between 1PM - 3PM on:01-12-22 Tuesday If your arrival time is 6:00 am, do not arrive prior to that time as the [facility] entrance doors do not open until 6:00 am.  REMEMBER: Instructions that are not followed completely may result in serious medical risk, up to and including death; or upon the discretion of your surgeon and anesthesiologist your surgery may need to be rescheduled.  Do not eat food after midnight the night before surgery.  No gum chewing, lozengers or hard candies.  You may however, drink CLEAR liquids up to 2 hours before you are scheduled to arrive for your surgery. Do not drink anything within 2 hours of your scheduled arrival time.  Clear liquids include: - water  - apple juice without pulp - gatorade (not RED colors) - black coffee or tea (Do NOT add milk or creamers to the coffee or tea) Do NOT drink anything that is not on this list.  TAKE THESE MEDICATIONS THE MORNING OF SURGERY WITH A SIP OF WATER: -metoprolol succinate (TOPROL-XL)  Stop your apixaban (ELIQUIS) 2 days prior to surgery-Last dose on 01-10-22 Sunday  One week prior to surgery: Stop Anti-inflammatories (NSAIDS) such as Advil, Aleve, Ibuprofen, Motrin, Naproxen, Naprosyn and Aspirin based products such as Excedrin, Goodys Powder, BC Powder.You may however, take Tylenol if needed for pain up until the day of surgery. Stop ANY OVER THE COUNTER supplements/vitamins NOW (01-08-22) until after surgery (Relaxium, Vitamin B12 and Seratame)  No Alcohol for 24 hours before or after surgery.  No Smoking including e-cigarettes for 24 hours prior to surgery.  No chewable tobacco products for at least 6 hours prior to surgery.  No nicotine patches on the day of surgery.  Do not use any  "recreational" drugs for at least a week prior to your surgery.  Please be advised that the combination of cocaine and anesthesia may have negative outcomes, up to and including death. If you test positive for cocaine, your surgery will be cancelled.  On the morning of surgery brush your teeth with toothpaste and water, you may rinse your mouth with mouthwash if you wish. Do not swallow any toothpaste or mouthwash.  Use CHG Soap as directed on instruction sheet.  Do not wear jewelry, make-up, hairpins, clips or nail polish.  Do not wear lotions, powders, or perfumes.   Do not shave body from the neck down 48 hours prior to surgery just in case you cut yourself which could leave a site for infection.  Also, freshly shaved skin may become irritated if using the CHG soap.  Contact lenses, hearing aids and dentures may not be worn into surgery.  Do not bring valuables to the hospital. [facility] is not responsible for any missing/lost belongings or valuables.  Notify your doctor if there is any change in your medical condition (cold, fever, infection).  Wear comfortable clothing (specific to your surgery type) to the hospital.  After surgery, you can help prevent lung complications by doing breathing exercises.  Take deep breaths and cough every 1-2 hours. Your doctor may order a device called an Incentive Spirometer to help you take deep breaths. When coughing or sneezing, hold a pillow firmly against your incision with both hands. This is called "splinting." Doing this helps protect your incision. It also decreases  belly discomfort.  If you are being admitted to the hospital overnight, leave your suitcase in the car. After surgery it may be brought to your room.  If you are being discharged the day of surgery, you will not be allowed to drive home. You will need a responsible adult (18 years or older) to drive you home and stay with you that night.   If you are taking public  transportation, you will need to have a responsible adult (18 years or older) with you. Please confirm with your physician that it is acceptable to use public transportation.   Please call the [facility] Dept. at [phone] if you have any questions about these instructions.  Surgery Visitation Policy:  Patients undergoing a surgery or procedure may have two family members or support persons with them as long as the person is not COVID-19 positive or experiencing its symptoms.

## 2022-01-11 ENCOUNTER — Encounter: Payer: Self-pay | Admitting: General Surgery

## 2022-01-11 NOTE — Progress Notes (Signed)
Perioperative Services  Pre-Admission/Anesthesia Testing Clinical Review  Date: 01/12/22  Patient Demographics:  Name: Marcus Kemp DOB:   [birth year] MRN:   [id number]  Planned Surgical Procedure(s):    Case: [id number] Date/Time: 01/13/22 1432   Procedure: HERNIA REPAIR INGUINAL ADULT (Left)   Anesthesia type: General   Pre-op diagnosis: left inguinal hernia   Location: [facility] OR ROOM 04 / [facility]   Surgeons: Robert Bellow, MD   NOTE: Available PAT nursing documentation and vital signs have been reviewed. Clinical nursing staff has updated patient's PMH/PSHx, current medication list, and drug allergies/intolerances to ensure comprehensive history available to assist in medical decision making as it pertains to the aforementioned surgical procedure and anticipated anesthetic course. Extensive review of available clinical information performed. [place] PMH and PSHx updated with any diagnoses/procedures that  may have been inadvertently omitted during his intake with the pre-admission testing department's nursing staff.  Clinical Discussion:  Marcus Kemp is a 88 y.o. male who is submitted for pre-surgical anesthesia review and clearance prior to him undergoing the above procedure. Patient is a Former Smoker (2 pack years; quit 07/1938). Pertinent PMH includes: atrial fibrillation, CHF, MVP, valvular heart disease, infrarenal AAA, pulmonary HTN, HLD, cardiac murmur, GERD (no daily Tx), anemia, OA, peripheral edema, HOH.  Patient is followed by cardiology Saralyn Pilar, MD). He was last seen in the cardiology clinic on 09/15/2021; notes reviewed.  At the time of his clinic visit, patient doing well overall from a cardiovascular perspective.  He denied any episodes of chest pain, however had chronic exertional dyspnea that was reported to be at baseline.  Of note, patient previously seen back in 08/2020 by his PCP with complaints of increased shortness of breath  and peripheral edema.  Patient had gained 20 pounds in less than 2 months.  It was determined the patient was not taking his diuretic therapy at that time.  Once restarted, patient diuresed and weight reduced by 16 pounds in 1 week.  Since that time, patient denies any PND, orthopnea, palpitations, significant increase in peripheral edema, vertiginous symptoms, or presyncope/syncope.  Patient with increased fatigue.  Patient with a past medical history significant for cardiovascular diagnoses.  Last TTE was performed on 03/28/2019 revealing a normal left ventricular systolic function with mild LVH; LVEF >55%.  There was moderate biatrial enlargement.  There was moderate right ventricular systolic dysfunction. Study suggested severe pulmonary hypertension; peak RV pressure 77.8 mmHg.   Mild pulmonary and moderate mitral/tricuspid valve regurgitation noted.  There was no evidence of a significant transvalvular gradient to suggest stenosis.  Patient has an atrial fibrillation diagnosis; CHA2DS2-VASc Score = 4 (age x 2, CHF, aortic plaque).  Patient's status post unsuccessful TEE cardioversion on 04/28/2016.  Patient received a 200 J shock x1, which only briefly converted him to sinus rhythm.  Patient was awakened from anesthesia and noted to be back and atrial fibrillation.  With patient consent, patient was placed back under anesthesia and additional 360 J cardioversions x 2 were delivered, however patient remained and refractory atrial fibrillation..  Patient found to have an infrarenal AAA on CT scan performed on 05/05/2021.  Aneurysmal defect measured 3.0 cm.  Routine annual surveillance recommended.  Cardiac rate and rhythm currently being maintained on Toprol all succinate.  Patient is chronically anticoagulated using dose reduced apixaban therapy; compliant with therapy with no evidence or reports of GI bleeding.  Patient did experience a single episode of hematuria on full dose apixaban.  This, along  with age and renal function, prompted dose reduction.  Blood pressure well controlled at 122/70 on currently prescribed diuretic and beta-blocker therapies.  He is not currently taking any type of prescribed lipid-lowering therapies for his HLD diagnosis or further ASCVD prevention.  He is not diabetic. Functional capacity limited by age-related debility and medical comorbidities.  Patient questionably able to achieve 4 METS of activity without angina/anginal equivalent symptoms.  It was recommended that patient remain compliant with his prescribed diuretic therapy.  Provider recommended that patient take this medication at least 3 times per week.  No other changes were made to his medication regimen.  Patient to follow-up with outpatient cardiology in 4 months or sooner if needed.  Marcus Kemp is scheduled for an elective LEFT INGUINAL HERNIA REPAIR on 01/13/2022 with Dr. Hervey Ard, MD. Given patient's past medical history significant for cardiovascular diagnoses, presurgical cardiac clearance was sought by the PAT team. Per cardiology, "this patient is optimized for surgery and may proceed with the planned procedural course with a LOW risk of significant perioperative cardiovascular complications".  Again, this patient is on daily anticoagulation therapy.  He has been instructed on recommendations for holding his apixaban dose for 2 days prior to his procedure with plans to restart as soon as postoperatively respectively minimized by his primary attending surgeon.  The patient is aware that his last dose of apixaban should be on 01/10/2022.  Patient denies previous perioperative complications with anesthesia in the past. In review of the available records, it is noted that patient underwent a general anesthetic course at [facility] (ASA II) in 04/2016 without documented complications.      12/29/2021   10:03 AM 06/29/2021    2:38 PM 05/18/2021    3:08 PM   Vitals with BMI  Height   '5\' 6"'$   Weight 141 lbs 151 lbs 148 lbs 11 oz  BMI   40.98  Systolic 119 147 829  Diastolic 65 91 84  Pulse 51 96 84    Providers/Specialists:   NOTE: Primary physician provider listed below. Patient may have been seen by APP or partner within same practice.   PROVIDER ROLE / SPECIALTY LAST OV  Bary Castilla Forest Gleason, MD General Surgery (Surgeon) 01/07/2022  Idelle Crouch, MD Primary Care Provider 12/22/2021  Isaias Cowman, MD Cardiology 09/15/2021  Earlie Server, MD Medical Oncology 12/29/2021   Allergies:  Penicillins  Current Home Medications:   No current facility-administered medications for this encounter.    acetaminophen (TYLENOL) 500 MG tablet   apixaban (ELIQUIS) 2.5 MG TABS tablet   furosemide (LASIX) 20 MG tablet   metoprolol succinate (TOPROL-XL) 25 MG 24 hr tablet   morphine 2 MG/ML injection   OVER THE COUNTER MEDICATION   OVER THE COUNTER MEDICATION   oxyCODONE (OXY IR/ROXICODONE) 5 MG immediate release tablet   vitamin B-12 (CYANOCOBALAMIN) 500 MCG tablet   History:   Past Medical History:  Diagnosis Date   A-fib (HCC)    a.) CHA2DS2-VASc = 4 (age x 2, CHF, aortic plaque). b.) s/p unsuccessful TEE cardioversion 04/28/2016 --> rec'd 200 J x1 and 360 J x 2. c.) rate/rhythm maintained on oral metoprolol succinante; chronically anticoagulated using dose reduced apixaban.   Anemia    Aortic atherosclerosis (HCC)    Arthritis    CHF (congestive heart failure) ([place])    a.) TTE 04/26/2016: EF >55%; mod LVH; MVP; mild-mod LAE, mild MR. b.) TTE 05/14/209: EF >55%; mod RVE; mild BAE; mod  MR.TR; triv AR/PR. c.) TTE 03/28/2019: EF >55%; mild LVH; mod BAE; mild PR, mod MR/TR.   GERD (gastroesophageal reflux disease)    Heart murmur    History of kidney stones    HOH (hard of hearing)    a.) hearing aid to LEFT ear.   Hyperlipidemia    Infrarenal abdominal aortic aneurysm (AAA) without rupture ([place]) 05/05/2021   a.) CT 05/05/2021 -->  measured 3.0 cm.   MVP (mitral valve prolapse)    Peripheral edema    Pneumonia    Pulmonary HTN ([place]) 04/26/2016   a.) TTE 04/26/2016: EF >55%; PASP 45. b.) TTE 12/06/2017: EF >55%; RVSP 76.6 mmHg. c.) TTE 03/28/2019: EF >55%; RVSP 77.8 mmHg.   Renal cell carcinoma (HCC)    Rheumatic fever    Sepsis ([place])    Valvular heart disease    Past Surgical History:  Procedure Laterality Date   LITHOTRIPSY     TEE WITH CARDIOVERSION N/A    P   TONSILLECTOMY     Family History  Problem Relation Age of Onset   Heart disease Sister    Hypertension Other    Social History   Tobacco Use   Smoking status: Former    Packs/day: 0.25    Years: 8.00    Total pack years: 2.00    Types: Cigarettes    Quit date: 1940    Years since quitting: 83.5   Smokeless tobacco: Never  Vaping Use   Vaping Use: Never used  Substance Use Topics   Alcohol use: No   Drug use: Never    Pertinent Clinical Results:  LABS: Labs reviewed: Acceptable for surgery.  Lab Results  Component Value Date   WBC 5.0 12/29/2021   HGB 12.6 (L) 12/29/2021   HCT 38.8 (L) 12/29/2021   MCV 110.2 (H) 12/29/2021   PLT 131 (L) 12/29/2021   Lab Results  Component Value Date   NA 138 12/29/2021   K 4.6 12/29/2021   CO2 26 12/29/2021   GLUCOSE 116 (H) 12/29/2021   BUN 32 (H) 12/29/2021   CREATININE 1.21 12/29/2021   CALCIUM 8.6 (L) 12/29/2021   GFRNONAA 55 (L) 12/29/2021    ECG: Date: 01/02/2022 Rate: 77 bpm Rhythm: atrial fibrillation Axis (leads I and aVF): Normal Intervals: QRS 92 ms. QTc 454 ms. ST segment and T wave changes: No evidence of acute ST segment elevation or depression Comparison: Similar to previous tracing obtained on 09/13/2016 NOTE: Tracing obtained at [facility]; unable for review. Above based on cardiologist's interpretation.    IMAGING / PROCEDURES: CT ABDOMEN PELVIS W WO CONTRAST performed on 12/24/2021 Stable solid enhancing 1.9 cm left interpolar renal lesion most  consistent with renal cell carcinoma. No evidence of renal tumor in vein or abdominopelvic metastatic disease. Stable benign bilateral renal cysts and subcentimeter renal lesions technically too small to accurately characterize but statistically likely to reflect cysts. Congestive hepatopathy with morphologic hepatic changes suggestive of cirrhosis. Cardiac enlargement with mild pulmonary edema. Infrarenal abdominal aortic aneurysm measuring 3 cm, unchanged. Aortic atherosclerosis  CT CHEST WO CONTRAST performed on 06/26/2021 No evidence of metastatic disease within the chest. Congestive heart failure, with bilateral pleural effusions and basilar predominant septal thickening. Concurrent subpleural interstitial thickening is suspicious for mild interstitial lung disease, nonspecific and of doubtful clinical significance given patient age. Bilateral nephrolithiasis. Pulmonary artery enlargement suggests pulmonary arterial hypertension.  TRANSTHORACIC ECHOCARDIOGRAM performed on 03/28/2019 Normal left ventricular systolic function with mild LVH; LVEF 55% Moderate biatrial enlargement Mild PR  moderate TR and MR No AR Severe pulmonary hypertension; RVSP 77.8 mmHg  Impression and Plan:  Marcus Kemp has been referred for pre-anesthesia review and clearance prior to him undergoing the planned anesthetic and procedural courses. Available labs, pertinent testing, and imaging results were personally reviewed by me. This patient has been appropriately cleared by cardiology with an overall LOW risk of significant perioperative cardiovascular complications.  Based on clinical review performed today (01/12/22), barring any significant acute changes in the patient's overall condition, it is anticipated that he will be able to proceed with the planned surgical intervention. Any acute changes in clinical condition may necessitate his procedure being postponed and/or cancelled. Patient will meet with  anesthesia team (MD and/or CRNA) on the day of his procedure for preoperative evaluation/assessment. Questions regarding anesthetic course will be fielded at that time.   Pre-surgical instructions were reviewed with the patient during his PAT appointment and questions were fielded by PAT clinical staff. Patient was advised that if any questions or concerns arise prior to his procedure then he should return a call to PAT and/or his surgeon's office to discuss.  Marcus Loh, MSN, APRN, FNP-C, CEN [facility]  Peri-operative Services Nurse Practitioner Phone: [phone] Fax: [phone] 01/12/22 10:29 AM  NOTE: This note has been prepared using Dragon dictation software. Despite my best ability to proofread, there is always the potential that unintentional transcriptional errors may still occur from this process.

## 2022-01-12 ENCOUNTER — Encounter: Payer: Self-pay | Admitting: General Surgery

## 2022-01-12 MED ORDER — ORAL CARE MOUTH RINSE: 15.0000 mL | Freq: Once | OROMUCOSAL | Status: AC

## 2022-01-12 MED ORDER — CHLORHEXIDINE GLUCONATE 0.12 % MT SOLN
15.0000 mL | Freq: Once | OROMUCOSAL | Status: AC
  Administered 2022-01-13: 15 mL via OROMUCOSAL

## 2022-01-12 MED ORDER — CHLORHEXIDINE GLUCONATE CLOTH 2 % EX PADS: 6.0000 | MEDICATED_PAD | Freq: Once | CUTANEOUS | Status: DC

## 2022-01-12 MED ORDER — FAMOTIDINE 20 MG PO TABS: 20.0000 mg | ORAL_TABLET | Freq: Once | ORAL | Status: AC

## 2022-01-12 MED ORDER — LACTATED RINGERS IV SOLN: INTRAVENOUS | Status: DC

## 2022-01-13 ENCOUNTER — Encounter
Admission: RE | Disposition: A | Discharge status: Home / Self Care | Payer: Self-pay | Source: Home / Self Care | Attending: General Surgery

## 2022-01-13 ENCOUNTER — Ambulatory Visit: Payer: Medicare Other | Admitting: Urgent Care

## 2022-01-13 ENCOUNTER — Ambulatory Visit
Admission: RE | Admit: 2022-01-13 | Discharge: 2022-01-13 | Disposition: A | Discharge status: Home / Self Care | Payer: Medicare Other | Attending: General Surgery | Admitting: General Surgery

## 2022-01-13 ENCOUNTER — Other Ambulatory Visit: Payer: Self-pay

## 2022-01-13 ENCOUNTER — Encounter: Payer: Self-pay | Admitting: General Surgery

## 2022-01-13 DIAGNOSIS — I341 Nonrheumatic mitral (valve) prolapse: Secondary | ICD-10-CM | POA: Insufficient documentation

## 2022-01-13 DIAGNOSIS — Z87891 Personal history of nicotine dependence: Secondary | ICD-10-CM | POA: Diagnosis not present

## 2022-01-13 DIAGNOSIS — I272 Pulmonary hypertension, unspecified: Secondary | ICD-10-CM | POA: Diagnosis not present

## 2022-01-13 DIAGNOSIS — Z87442 Personal history of urinary calculi: Secondary | ICD-10-CM | POA: Insufficient documentation

## 2022-01-13 DIAGNOSIS — Z79899 Other long term (current) drug therapy: Secondary | ICD-10-CM | POA: Diagnosis not present

## 2022-01-13 DIAGNOSIS — Z85528 Personal history of other malignant neoplasm of kidney: Secondary | ICD-10-CM | POA: Diagnosis not present

## 2022-01-13 DIAGNOSIS — I509 Heart failure, unspecified: Secondary | ICD-10-CM | POA: Insufficient documentation

## 2022-01-13 DIAGNOSIS — K219 Gastro-esophageal reflux disease without esophagitis: Secondary | ICD-10-CM | POA: Diagnosis not present

## 2022-01-13 DIAGNOSIS — I4891 Unspecified atrial fibrillation: Secondary | ICD-10-CM | POA: Diagnosis not present

## 2022-01-13 DIAGNOSIS — M1990 Unspecified osteoarthritis, unspecified site: Secondary | ICD-10-CM | POA: Insufficient documentation

## 2022-01-13 DIAGNOSIS — Z7901 Long term (current) use of anticoagulants: Secondary | ICD-10-CM | POA: Insufficient documentation

## 2022-01-13 DIAGNOSIS — K4090 Unilateral inguinal hernia, without obstruction or gangrene, not specified as recurrent: Secondary | ICD-10-CM | POA: Insufficient documentation

## 2022-01-13 DIAGNOSIS — I7143 Infrarenal abdominal aortic aneurysm, without rupture: Secondary | ICD-10-CM | POA: Diagnosis not present

## 2022-01-13 HISTORY — DX: Endocarditis, valve unspecified: I38

## 2022-01-13 HISTORY — PX: INSERTION OF MESH: SHX5868

## 2022-01-13 HISTORY — DX: Atherosclerosis of aorta: I70.0

## 2022-01-13 HISTORY — PX: INGUINAL HERNIA REPAIR: SHX194

## 2022-01-13 SURGERY — REPAIR, HERNIA, INGUINAL, ADULT
Anesthesia: General | Laterality: Left

## 2022-01-13 MED ORDER — FENTANYL CITRATE (PF) 100 MCG/2ML IJ SOLN
INTRAMUSCULAR | Status: AC
  Filled 2022-01-13: qty 2

## 2022-01-13 MED ORDER — FENTANYL CITRATE (PF) 100 MCG/2ML IJ SOLN
INTRAMUSCULAR | Status: DC | PRN
  Administered 2022-01-13 (×4): 25 ug via INTRAVENOUS

## 2022-01-13 MED ORDER — CLINDAMYCIN PHOSPHATE 600 MG/50ML IV SOLN
INTRAVENOUS | Status: DC | PRN
  Administered 2022-01-13: 600 mg via INTRAVENOUS

## 2022-01-13 MED ORDER — ONDANSETRON HCL 4 MG/2ML IJ SOLN
INTRAMUSCULAR | Status: DC | PRN
  Administered 2022-01-13: 4 mg via INTRAVENOUS

## 2022-01-13 MED ORDER — ONDANSETRON HCL 4 MG/2ML IJ SOLN
INTRAMUSCULAR | Status: AC
  Filled 2022-01-13: qty 2

## 2022-01-13 MED ORDER — EPHEDRINE 5 MG/ML INJ
INTRAVENOUS | Status: AC
  Filled 2022-01-13: qty 5

## 2022-01-13 MED ORDER — HYDROCODONE-ACETAMINOPHEN 5-325 MG PO TABS: 1.0000 | ORAL_TABLET | ORAL | 0 refills | Status: DC | PRN

## 2022-01-13 MED ORDER — ACETAMINOPHEN 10 MG/ML IV SOLN: 1000.0000 mg | Freq: Once | INTRAVENOUS | Status: DC | PRN

## 2022-01-13 MED ORDER — BUPIVACAINE-EPINEPHRINE (PF) 0.5% -1:200000 IJ SOLN
INTRAMUSCULAR | Status: DC | PRN
  Administered 2022-01-13: 30 mL

## 2022-01-13 MED ORDER — BUPIVACAINE-EPINEPHRINE (PF) 0.5% -1:200000 IJ SOLN
INTRAMUSCULAR | Status: AC
  Filled 2022-01-13: qty 30

## 2022-01-13 MED ORDER — APIXABAN 2.5 MG PO TABS: 2.5000 mg | ORAL_TABLET | Freq: Two times a day (BID) | ORAL | 0 refills | Status: DC

## 2022-01-13 MED ORDER — ACETAMINOPHEN 500 MG PO TABS
1000.0000 mg | ORAL_TABLET | Freq: Once | ORAL | Status: AC
  Administered 2022-01-13: 1000 mg via ORAL

## 2022-01-13 MED ORDER — CHLORHEXIDINE GLUCONATE 0.12 % MT SOLN
OROMUCOSAL | Status: AC
  Filled 2022-01-13: qty 15

## 2022-01-13 MED ORDER — EPHEDRINE SULFATE (PRESSORS) 50 MG/ML IJ SOLN
INTRAMUSCULAR | Status: DC | PRN
  Administered 2022-01-13 (×2): 5 mg via INTRAVENOUS
  Administered 2022-01-13: 10 mg via INTRAVENOUS
  Administered 2022-01-13: 5 mg via INTRAVENOUS
  Administered 2022-01-13: 10 mg via INTRAVENOUS

## 2022-01-13 MED ORDER — ACETAMINOPHEN 500 MG PO TABS
ORAL_TABLET | ORAL | Status: AC
  Filled 2022-01-13: qty 2

## 2022-01-13 MED ORDER — DEXAMETHASONE SODIUM PHOSPHATE 10 MG/ML IJ SOLN
INTRAMUSCULAR | Status: DC | PRN
  Administered 2022-01-13: 5 mg via INTRAVENOUS

## 2022-01-13 MED ORDER — FAMOTIDINE 20 MG PO TABS
ORAL_TABLET | ORAL | Status: AC
  Administered 2022-01-13: 20 mg via ORAL
  Filled 2022-01-13: qty 1

## 2022-01-13 MED ORDER — DEXAMETHASONE SODIUM PHOSPHATE 10 MG/ML IJ SOLN
INTRAMUSCULAR | Status: AC
  Filled 2022-01-13: qty 1

## 2022-01-13 MED ORDER — DROPERIDOL 2.5 MG/ML IJ SOLN: 0.6250 mg | Freq: Once | INTRAMUSCULAR | Status: DC | PRN

## 2022-01-13 MED ORDER — PROMETHAZINE HCL 25 MG/ML IJ SOLN: 6.2500 mg | INTRAMUSCULAR | Status: DC | PRN

## 2022-01-13 MED ORDER — LIDOCAINE HCL (PF) 2 % IJ SOLN
INTRAMUSCULAR | Status: DC | PRN
  Administered 2022-01-13: 60 mg

## 2022-01-13 MED ORDER — CLINDAMYCIN PHOSPHATE 600 MG/50ML IV SOLN
INTRAVENOUS | Status: AC
  Filled 2022-01-13: qty 50

## 2022-01-13 MED ORDER — TRAMADOL HCL 50 MG PO TABS: 25.0000 mg | ORAL_TABLET | Freq: Once | ORAL | Status: DC | PRN

## 2022-01-13 MED ORDER — LIDOCAINE HCL (PF) 2 % IJ SOLN
INTRAMUSCULAR | Status: AC
  Filled 2022-01-13: qty 5

## 2022-01-13 MED ORDER — PROPOFOL 10 MG/ML IV BOLUS
INTRAVENOUS | Status: DC | PRN
  Administered 2022-01-13: 80 mg via INTRAVENOUS
  Administered 2022-01-13: 20 mg via INTRAVENOUS

## 2022-01-13 SURGICAL SUPPLY — 37 items
BLADE SURG 15 STRL SS SAFETY (BLADE) ×6 IMPLANT
CHLORAPREP W/TINT 26 (MISCELLANEOUS) ×3 IMPLANT
DRAIN PENROSE 12X.25 LTX STRL (MISCELLANEOUS) ×3 IMPLANT
DRAPE LAPAROTOMY 100X77 ABD (DRAPES) ×3 IMPLANT
DRSG TEGADERM 4X4.75 (GAUZE/BANDAGES/DRESSINGS) ×3 IMPLANT
DRSG TELFA 4X3 1S NADH ST (GAUZE/BANDAGES/DRESSINGS) ×3 IMPLANT
ELECT REM PT RETURN 9FT ADLT (ELECTROSURGICAL) ×3
ELECTRODE REM PT RTRN 9FT ADLT (ELECTROSURGICAL) ×2 IMPLANT
GAUZE 4X4 16PLY ~~LOC~~+RFID DBL (SPONGE) ×3 IMPLANT
GLOVE BIO SURGEON STRL SZ7.5 (GLOVE) ×4 IMPLANT
GLOVE SURG UNDER LTX SZ8 (GLOVE) ×6 IMPLANT
GOWN STRL REUS W/ TWL LRG LVL3 (GOWN DISPOSABLE) ×4 IMPLANT
GOWN STRL REUS W/TWL LRG LVL3 (GOWN DISPOSABLE) ×3
KIT TURNOVER KIT A (KITS) ×3 IMPLANT
LABEL OR SOLS (LABEL) ×3 IMPLANT
MANIFOLD NEPTUNE II (INSTRUMENTS) ×3 IMPLANT
MESH HERNIA PLUG LRG (Mesh General) IMPLANT
MESH HERNIA SYS ULTRAPRO LRG (Mesh General) IMPLANT
MESH HERNIA ULTRAPRO MED (Mesh General) IMPLANT
MESH MARLEX PLUG MEDIUM (Mesh General) ×1 IMPLANT
NEEDLE HYPO 22GX1.5 SAFETY (NEEDLE) ×6 IMPLANT
PACK BASIN MINOR ARMC (MISCELLANEOUS) ×3 IMPLANT
SPIKE FLUID TRANSFER (MISCELLANEOUS) ×3 IMPLANT
STRIP CLOSURE SKIN 1/2X4 (GAUZE/BANDAGES/DRESSINGS) ×3 IMPLANT
SUT PDS AB 0 CT1 27 (SUTURE) IMPLANT
SUT SURGILON 0 BLK (SUTURE) ×3 IMPLANT
SUT VIC AB 2-0 SH 27 (SUTURE) ×1
SUT VIC AB 2-0 SH 27XBRD (SUTURE) ×2 IMPLANT
SUT VIC AB 3-0 54X BRD REEL (SUTURE) ×2 IMPLANT
SUT VIC AB 3-0 BRD 54 (SUTURE) ×1
SUT VIC AB 3-0 SH 27 (SUTURE) ×1
SUT VIC AB 3-0 SH 27X BRD (SUTURE) ×2 IMPLANT
SUT VIC AB 4-0 FS2 27 (SUTURE) ×3 IMPLANT
SWABSTK COMLB BENZOIN TINCTURE (MISCELLANEOUS) ×3 IMPLANT
SYR 10ML LL (SYRINGE) ×3 IMPLANT
SYR 3ML LL SCALE MARK (SYRINGE) ×3 IMPLANT
WATER STERILE IRR 500ML POUR (IV SOLUTION) ×2 IMPLANT

## 2022-01-13 NOTE — Op Note (Signed)
Preoperative diagnosis: Symptomatic right left inguinal hernia.  Postoperative diagnosis: Same.  Operative procedure: Left inguinal hernia repair, direct, with medium Bard PerFix plug and patch.  Operating surgeon: Hervey Ard, MD.  Anesthesia: General by LMA, Marcaine 0.5% with 1: 200,000 units of epinephrine, 30 cc; Toradol: 15 mg.  Estimated blood loss: 2 cc.  Clinical note: This 88 year old male recently developed a symptomatic left inguinal hernia.  On examination in the office it was fairly difficult to reduce.  In spite of his advanced age and ongoing treatment for atrial fibrillation with Eliquis it was elected to proceed to elective repair to minimize the need for an emergency procedure.  The patient received clindamycin on induction of anesthesia due to reported anaphylactic reaction to penicillin.  SCD stockings for DVT prevention.  Operative note: The patient underwent general anesthesia and tolerated this well.  The site was cleansed with ChloraPrep and draped.  Field block anesthesia was established.  A 5 cm skin line incision along the anticipated course the inguinal canal was carried out the skin and subtendinous tissue with hemostasis achieved electrocautery.  The external oblique was opened in the direction of its fibers.  The cord was dissected to the level of the internal ring with no evidence of an internal hernia sac.  There was a 1.5 cm defect at the direct opening with a large amount of fat.  The overlying fascia was excised and discarded.  Preperitoneal space was cleared.  A medium Bard PerFix plug was anchored into place and sutured to the transverse abdominis aponeurosis and the iliopubic tract.  This was completed with 0 Surgilon sutures x2.  An onlay mesh was then anchored to the pubic tubercle and along the inguinal ligament with similar sutures and then the medial and superior borders anchored to the transverse abdominis aponeurosis.  Both the ilioinguinal and  iliohypogastric nerves were identified and protected throughout the procedure.  Toradol was placed in the wound.  The external oblique was closed with a running 2-0 Vicryl.  Scarpa's fascia was closed with a running 3-0 Vicryl.  Skin was closed with a running 4-0 Vicryl subcuticular suture.  Benzoin, Steri-Strips, Telfa and Tegaderm dressing applied.  The patient tolerated the procedure well and was taken to the PACU in stable condition.

## 2022-01-13 NOTE — Anesthesia Preprocedure Evaluation (Addendum)
Anesthesia Evaluation  Patient identified by MRN, date of birth, ID band Patient awake    Reviewed: Allergy & Precautions, NPO status , Patient's Chart, lab work & pertinent test results, reviewed documented beta blocker date and time   Airway Mallampati: II  TM Distance: >3 FB Neck ROM: full    Dental no notable dental hx.    Pulmonary former smoker,  Pulmonary edema on recent CT scan   Pulmonary exam normal        Cardiovascular Exercise Tolerance: Poor pulmonary hypertension+CHF  Normal cardiovascular exam+ dysrhythmias Atrial Fibrillation + Valvular Problems/Murmurs   infrarenal AAA  ECG: Date: 01/02/2022 Rate: 77 bpm Rhythm: atrial fibrillation Axis (leads I and aVF): Normal Intervals: QRS 92 ms. QTc 454 ms. ST segment and T wave changes: No evidence of acute ST segment elevation or depression Comparison: Similar to previous tracing obtained on 09/13/2016 NOTE: Tracing obtained at [facility]; unable for review. Above based on cardiologist's interpretation.   Neuro/Psych HOH (hard of hearing) negative psych ROS   GI/Hepatic GERD  ,CT scan 6/23 with indications for cirrhosis left inguinal hernia   Endo/Other  negative endocrine ROS  Renal/GU      Musculoskeletal  (+) Arthritis ,   Abdominal   Peds  Hematology  (+) Blood dyscrasia, anemia , Thrombocytopenia   Anesthesia Other Findings Past Medical History: No date: A-fib [facility])     Comment:  a.) CHA2DS2-VASc = 4 (age x 2, CHF, aortic plaque). b.)               s/p unsuccessful TEE cardioversion 04/28/2016 --> rec'd               200 J x1 and 360 J x 2. c.) rate/rhythm maintained on               oral metoprolol succinante; chronically anticoagulated               using dose reduced apixaban. No date: Anemia No date: Aortic atherosclerosis (HCC) No date: Arthritis No date: CHF (congestive heart failure) ([place])     Comment:  a.) TTE 04/26/2016: EF  >55%; mod LVH; MVP; mild-mod LAE,              mild MR. b.) TTE 05/14/209: EF >55%; mod RVE; mild BAE;               mod MR.TR; triv AR/PR. c.) TTE 03/28/2019: EF >55%; mild               LVH; mod BAE; mild PR, mod MR/TR. No date: GERD (gastroesophageal reflux disease) No date: Heart murmur No date: History of kidney stones No date: HOH (hard of hearing)     Comment:  a.) hearing aid to LEFT ear. No date: Hyperlipidemia 05/05/2021: Infrarenal abdominal aortic aneurysm (AAA) without  rupture (HCC)     Comment:  a.) CT 05/05/2021 --> measured 3.0 cm. No date: MVP (mitral valve prolapse) No date: Peripheral edema No date: Pneumonia 04/26/2016: Pulmonary HTN ([place])     Comment:  a.) TTE 04/26/2016: EF >55%; PASP 45. b.) TTE               12/06/2017: EF >55%; RVSP 76.6 mmHg. c.) TTE 03/28/2019:               EF >55%; RVSP 77.8 mmHg. No date: Renal cell carcinoma (HCC) No date: Rheumatic fever No date: Sepsis ([place]) No date: Valvular heart disease  Past Surgical History: No date:  LITHOTRIPSY No date: TEE WITH CARDIOVERSION; N/A     Comment:  P No date: TONSILLECTOMY     Reproductive/Obstetrics negative OB ROS                           Anesthesia Physical Anesthesia Plan  ASA: 3  Anesthesia Plan: General LMA   Post-op Pain Management: Tylenol PO (pre-op)*   Induction: Intravenous  PONV Risk Score and Plan: Dexamethasone and Ondansetron  Airway Management Planned: LMA  Additional Equipment:   Intra-op Plan:   Post-operative Plan: Extubation in OR  Informed Consent: I have reviewed the patients History and Physical, chart, labs and discussed the procedure including the risks, benefits and alternatives for the proposed anesthesia with the patient or authorized representative who has indicated his/her understanding and acceptance.     Dental Advisory Given  Plan Discussed with: Anesthesiologist, CRNA and Surgeon  Anesthesia Plan Comments:         Anesthesia Quick Evaluation

## 2022-01-13 NOTE — Discharge Instructions (Signed)
AMBULATORY SURGERY  ?DISCHARGE INSTRUCTIONS ? ? ?The drugs that you were given will stay in your system until tomorrow so for the next 24 hours you should not: ? ?Drive an automobile ?Make any legal decisions ?Drink any alcoholic beverage ? ? ?You may resume regular meals tomorrow.  Today it is better to start with liquids and gradually work up to solid foods. ? ?You may eat anything you prefer, but it is better to start with liquids, then soup and crackers, and gradually work up to solid foods. ? ? ?Please notify your doctor immediately if you have any unusual bleeding, trouble breathing, redness and pain at the surgery site, drainage, fever, or pain not relieved by medication. ? ? ? ?Additional Instructions: ? ? ? ?Please contact your physician with any problems or Same Day Surgery at [phone], Monday through Friday 6 am to 4 pm, or [place] at [facility] number at [phone].  ?

## 2022-01-13 NOTE — H&P (Signed)
Marcus Kemp [id number] 01/10/1925     HPI:  88 y/o male with symptomatic Left inguinal hernia that is difficult to reduce.  For mesh repair.   Medications Prior to Admission  Medication Sig Dispense Refill Last Dose   acetaminophen (TYLENOL) 500 MG tablet Take 1,000 mg by mouth at bedtime.   01/12/2022   furosemide (LASIX) 20 MG tablet Take 1 tablet by mouth every morning.   01/12/2022   metoprolol succinate (TOPROL-XL) 25 MG 24 hr tablet Take 25 mg by mouth every morning.   01/12/2022   OVER THE COUNTER MEDICATION Take 2 tablets by mouth at bedtime. Seratame   01/13/2022   OVER THE COUNTER MEDICATION Take 1 tablet by mouth at bedtime. Relaxium   01/13/2022   vitamin B-12 (CYANOCOBALAMIN) 500 MCG tablet Take 1 tablet (500 mcg total) by mouth daily. 90 tablet 3 01/12/2022   apixaban (ELIQUIS) 2.5 MG TABS tablet Take 1 tablet by mouth 2 (two) times daily.   01/10/2022   morphine 2 MG/ML injection Inject 1 mL (2 mg total) into the vein every 4 (four) hours as needed. (Patient not taking: Reported on 05/18/2021) 1 mL 0    oxyCODONE (OXY IR/ROXICODONE) 5 MG immediate release tablet Take 1 tablet (5 mg total) by mouth every 4 (four) hours as needed for moderate pain. (Patient not taking: Reported on 05/18/2021) 30 tablet 0    Allergies  Allergen Reactions   Penicillins Anaphylaxis   Past Medical History:  Diagnosis Date   A-fib ([place])    a.) CHA2DS2-VASc = 4 (age x 2, CHF, aortic plaque). b.) s/p unsuccessful TEE cardioversion 04/28/2016 --> rec'd 200 J x1 and 360 J x 2. c.) rate/rhythm maintained on oral metoprolol succinante; chronically anticoagulated using dose reduced apixaban.   Anemia    Aortic atherosclerosis (HCC)    Arthritis    CHF (congestive heart failure) ([place])    a.) TTE 04/26/2016: EF >55%; mod LVH; MVP; mild-mod LAE, mild MR. b.) TTE 05/14/209: EF >55%; mod RVE; mild BAE; mod MR.TR; triv AR/PR. c.) TTE 03/28/2019: EF >55%; mild LVH; mod BAE; mild PR, mod MR/TR.   GERD  (gastroesophageal reflux disease)    Heart murmur    History of kidney stones    HOH (hard of hearing)    a.) hearing aid to LEFT ear.   Hyperlipidemia    Infrarenal abdominal aortic aneurysm (AAA) without rupture ([place]) 05/05/2021   a.) CT 05/05/2021 --> measured 3.0 cm.   MVP (mitral valve prolapse)    Peripheral edema    Pneumonia    Pulmonary HTN ([place]) 04/26/2016   a.) TTE 04/26/2016: EF >55%; PASP 45. b.) TTE 12/06/2017: EF >55%; RVSP 76.6 mmHg. c.) TTE 03/28/2019: EF >55%; RVSP 77.8 mmHg.   Renal cell carcinoma (HCC)    Rheumatic fever    Sepsis ([place])    Valvular heart disease    Past Surgical History:  Procedure Laterality Date   LITHOTRIPSY     TEE WITH CARDIOVERSION N/A    P   TONSILLECTOMY     Social History   Socioeconomic History   Marital status: Married    Spouse name: Not on file   Number of children: Not on file   Years of education: Not on file   Highest education level: Not on file  Occupational History   Not on file  Tobacco Use   Smoking status: Former    Packs/day: 0.25    Years: 8.00    Total pack years: 2.00  Types: Cigarettes    Quit date: 73    Years since quitting: 83.5   Smokeless tobacco: Never  Vaping Use   Vaping Use: Never used  Substance and Sexual Activity   Alcohol use: No   Drug use: Never   Sexual activity: Not on file  Other Topics Concern   Not on file  Social History Narrative   Not on file   Social Determinants of Health   Financial Resource Strain: Not on file  Food Insecurity: Not on file  Transportation Needs: Not on file  Physical Activity: Not on file  Stress: Not on file  Social Connections: Not on file  Intimate Partner Violence: Not on file   Social History   Social History Narrative   Not on file     ROS: Negative.     PE: HEENT: Negative. Lungs: Clear. Cardio: RR.  Assessment/Plan:  Proceed with planned left inguinal hernia repair. [facility] 01/13/2022

## 2022-01-13 NOTE — Transfer of Care (Signed)
Immediate Anesthesia Transfer of Care Note  Patient: ABHIRAM CRIADO  Procedure(s) Performed: HERNIA REPAIR INGUINAL ADULT (Left) INSERTION OF MESH  Patient Location: PACU  Anesthesia Type:General  Level of Consciousness: sedated  Airway & Oxygen Therapy: Patient Spontanous Breathing and Patient connected to face mask oxygen  Post-op Assessment: Report given to RN and Post -op Vital signs reviewed and stable  Post vital signs: Reviewed  Last Vitals:  Vitals Value Taken Time  BP    Temp    Pulse 74 01/13/22 1233  Resp 11 01/13/22 1233  SpO2 100 % 01/13/22 1233  Vitals shown include unvalidated device data.  Last Pain:  Vitals:   01/13/22 0908  TempSrc: Temporal  PainSc: 0-No pain      Patients Stated Pain Goal: 0 (44/58/48 3507)  Complications: No notable events documented.

## 2022-01-13 NOTE — Anesthesia Postprocedure Evaluation (Signed)
Anesthesia Post Note  Patient: Marcus Kemp  Procedure(s) Performed: HERNIA REPAIR INGUINAL ADULT (Left) INSERTION OF MESH  Patient location during evaluation: PACU Anesthesia Type: General Level of consciousness: awake and alert Pain management: pain level controlled Vital Signs Assessment: post-procedure vital signs reviewed and stable Respiratory status: spontaneous breathing, nonlabored ventilation and respiratory function stable Cardiovascular status: blood pressure returned to baseline and stable Postop Assessment: no apparent nausea or vomiting Anesthetic complications: no   No notable events documented.   Last Vitals:  Vitals:   01/13/22 1250 01/13/22 1301  BP:  126/72  Pulse: 71 70  Resp: 14 10  Temp:    SpO2: 100% 99%    Last Pain:  Vitals:   01/13/22 1301  TempSrc:   PainSc: 0-No pain                 [place] Ouch

## 2022-01-13 NOTE — Anesthesia Procedure Notes (Signed)
Procedure Name: LMA Insertion Date/Time: 01/13/2022 11:23 AM  Performed by: Jerrye Noble, CRNAPre-anesthesia Checklist: Patient identified, Emergency Drugs available, Suction available and Patient being monitored Patient Re-evaluated:Patient Re-evaluated prior to induction Oxygen Delivery Method: Circle system utilized Preoxygenation: Pre-oxygenation with 100% oxygen Induction Type: IV induction Ventilation: Mask ventilation without difficulty LMA: LMA inserted LMA Size: 4.0 Number of attempts: 1 Placement Confirmation: positive ETCO2, breath sounds checked- equal and bilateral and ETT inserted through vocal cords under direct vision Tube secured with: Tape

## 2022-01-13 NOTE — Progress Notes (Signed)
Pt's VSS WNL. Pt voided 25 mls. Oked per MD for pt to be discharged. Continue to monitor

## 2022-01-27 ENCOUNTER — Encounter: Payer: Self-pay | Admitting: Medical Oncology

## 2022-01-27 ENCOUNTER — Emergency Department: Payer: Medicare Other

## 2022-01-27 ENCOUNTER — Observation Stay
Admission: EM | Admit: 2022-01-27 | Discharge: 2022-01-28 | Disposition: A | Discharge status: Home / Self Care | Payer: Medicare Other | Attending: Emergency Medicine | Admitting: Emergency Medicine

## 2022-01-27 DIAGNOSIS — I341 Nonrheumatic mitral (valve) prolapse: Secondary | ICD-10-CM | POA: Insufficient documentation

## 2022-01-27 DIAGNOSIS — I509 Heart failure, unspecified: Secondary | ICD-10-CM

## 2022-01-27 DIAGNOSIS — I4811 Longstanding persistent atrial fibrillation: Secondary | ICD-10-CM | POA: Diagnosis present

## 2022-01-27 DIAGNOSIS — Z7901 Long term (current) use of anticoagulants: Secondary | ICD-10-CM | POA: Insufficient documentation

## 2022-01-27 DIAGNOSIS — D649 Anemia, unspecified: Secondary | ICD-10-CM | POA: Diagnosis present

## 2022-01-27 DIAGNOSIS — D7589 Other specified diseases of blood and blood-forming organs: Secondary | ICD-10-CM | POA: Diagnosis not present

## 2022-01-27 DIAGNOSIS — I051 Rheumatic mitral insufficiency: Secondary | ICD-10-CM | POA: Insufficient documentation

## 2022-01-27 DIAGNOSIS — Z85528 Personal history of other malignant neoplasm of kidney: Secondary | ICD-10-CM | POA: Diagnosis not present

## 2022-01-27 DIAGNOSIS — I00 Rheumatic fever without heart involvement: Secondary | ICD-10-CM | POA: Insufficient documentation

## 2022-01-27 DIAGNOSIS — Z87891 Personal history of nicotine dependence: Secondary | ICD-10-CM | POA: Diagnosis not present

## 2022-01-27 DIAGNOSIS — R601 Generalized edema: Secondary | ICD-10-CM | POA: Diagnosis not present

## 2022-01-27 DIAGNOSIS — N200 Calculus of kidney: Secondary | ICD-10-CM | POA: Insufficient documentation

## 2022-01-27 DIAGNOSIS — R609 Edema, unspecified: Secondary | ICD-10-CM

## 2022-01-27 DIAGNOSIS — N5089 Other specified disorders of the male genital organs: Secondary | ICD-10-CM | POA: Insufficient documentation

## 2022-01-27 DIAGNOSIS — D539 Nutritional anemia, unspecified: Secondary | ICD-10-CM | POA: Diagnosis present

## 2022-01-27 DIAGNOSIS — I5033 Acute on chronic diastolic (congestive) heart failure: Secondary | ICD-10-CM | POA: Diagnosis not present

## 2022-01-27 DIAGNOSIS — Z79899 Other long term (current) drug therapy: Secondary | ICD-10-CM | POA: Insufficient documentation

## 2022-01-27 DIAGNOSIS — E785 Hyperlipidemia, unspecified: Secondary | ICD-10-CM | POA: Insufficient documentation

## 2022-01-27 DIAGNOSIS — M7989 Other specified soft tissue disorders: Secondary | ICD-10-CM | POA: Diagnosis present

## 2022-01-27 LAB — BASIC METABOLIC PANEL
Anion gap: 8 (ref 5–15)
BUN: 23 mg/dL (ref 8–23)
CO2: 26 mmol/L (ref 22–32)
Calcium: 8.6 mg/dL — ABNORMAL LOW (ref 8.9–10.3)
Chloride: 103 mmol/L (ref 98–111)
Creatinine, Ser: 1.15 mg/dL (ref 0.61–1.24)
GFR, Estimated: 59 mL/min — ABNORMAL LOW (ref >60)
Glucose, Bld: 85 mg/dL (ref 70–99)
Potassium: 4.6 mmol/L (ref 3.5–5.1)
Sodium: 137 mmol/L (ref 135–145)

## 2022-01-27 LAB — CBC WITH DIFFERENTIAL/PLATELET
Abs Immature Granulocytes: 0.02 10*3/uL (ref 0.00–0.07)
Basophils Absolute: 0.1 10*3/uL (ref 0.0–0.1)
Basophils Relative: 1 %
Eosinophils Absolute: 0.1 10*3/uL (ref 0.0–0.5)
Eosinophils Relative: 2 %
HCT: 39.9 % (ref 39.0–52.0)
Hemoglobin: 12.7 g/dL — ABNORMAL LOW (ref 13.0–17.0)
Immature Granulocytes: 0 %
Lymphocytes Relative: 20 %
Lymphs Abs: 1.2 10*3/uL (ref 0.7–4.0)
MCH: 35 pg — ABNORMAL HIGH (ref 26.0–34.0)
MCHC: 31.8 g/dL (ref 30.0–36.0)
MCV: 109.9 fL — ABNORMAL HIGH (ref 80.0–100.0)
Monocytes Absolute: 0.6 10*3/uL (ref 0.1–1.0)
Monocytes Relative: 9 %
Neutro Abs: 4.1 10*3/uL (ref 1.7–7.7)
Neutrophils Relative %: 68 %
Platelets: 109 10*3/uL — ABNORMAL LOW (ref 150–400)
RBC: 3.63 MIL/uL — ABNORMAL LOW (ref 4.22–5.81)
RDW: 17.9 % — ABNORMAL HIGH (ref 11.5–15.5)
WBC: 6 10*3/uL (ref 4.0–10.5)
nRBC: 0 % (ref 0.0–0.2)

## 2022-01-27 LAB — TROPONIN I (HIGH SENSITIVITY): Troponin I (High Sensitivity): 40 ng/L — ABNORMAL HIGH (ref <18)

## 2022-01-27 LAB — BRAIN NATRIURETIC PEPTIDE: B Natriuretic Peptide: 1028.9 pg/mL — ABNORMAL HIGH (ref 0.0–100.0)

## 2022-01-27 MED ORDER — SODIUM CHLORIDE 0.9% FLUSH
3.0000 mL | Freq: Two times a day (BID) | INTRAVENOUS | Status: DC
  Administered 2022-01-28 (×2): 3 mL via INTRAVENOUS

## 2022-01-27 MED ORDER — BISACODYL 5 MG PO TBEC: 5.0000 mg | DELAYED_RELEASE_TABLET | Freq: Every day | ORAL | Status: DC | PRN

## 2022-01-27 MED ORDER — ACETAMINOPHEN 650 MG RE SUPP: 650.0000 mg | Freq: Four times a day (QID) | RECTAL | Status: DC | PRN

## 2022-01-27 MED ORDER — APIXABAN 2.5 MG PO TABS
2.5000 mg | ORAL_TABLET | Freq: Two times a day (BID) | ORAL | Status: DC
  Administered 2022-01-28 (×2): 2.5 mg via ORAL
  Filled 2022-01-27 (×2): qty 1

## 2022-01-27 MED ORDER — POLYETHYLENE GLYCOL 3350 17 G PO PACK: 17.0000 g | PACK | Freq: Every day | ORAL | Status: DC | PRN

## 2022-01-27 MED ORDER — ACETAMINOPHEN 325 MG PO TABS: 650.0000 mg | ORAL_TABLET | Freq: Four times a day (QID) | ORAL | Status: DC | PRN

## 2022-01-27 MED ORDER — METOPROLOL SUCCINATE ER 50 MG PO TB24
25.0000 mg | ORAL_TABLET | ORAL | Status: DC
Start: 2022-01-28 — End: 2022-01-28
  Administered 2022-01-28: 25 mg via ORAL
  Filled 2022-01-27: qty 1

## 2022-01-27 MED ORDER — ACETAMINOPHEN 500 MG PO TABS: 1000.0000 mg | ORAL_TABLET | Freq: Four times a day (QID) | ORAL | Status: DC | PRN

## 2022-01-27 MED ORDER — HYDRALAZINE HCL 20 MG/ML IJ SOLN: 5.0000 mg | INTRAMUSCULAR | Status: DC | PRN

## 2022-01-27 MED ORDER — FUROSEMIDE 10 MG/ML IJ SOLN
40.0000 mg | Freq: Once | INTRAMUSCULAR | Status: AC
  Administered 2022-01-27: 40 mg via INTRAVENOUS
  Filled 2022-01-27: qty 4

## 2022-01-27 MED ORDER — FUROSEMIDE 10 MG/ML IJ SOLN
20.0000 mg | Freq: Two times a day (BID) | INTRAMUSCULAR | Status: DC
  Administered 2022-01-28: 20 mg via INTRAVENOUS
  Filled 2022-01-27: qty 4

## 2022-01-27 MED ORDER — DOCUSATE SODIUM 100 MG PO CAPS
100.0000 mg | ORAL_CAPSULE | Freq: Two times a day (BID) | ORAL | Status: DC
  Administered 2022-01-28 (×2): 100 mg via ORAL
  Filled 2022-01-27 (×2): qty 1

## 2022-01-27 NOTE — Assessment & Plan Note (Signed)
secondary to volume overload form CHF. elevation and diuresis.

## 2022-01-27 NOTE — Assessment & Plan Note (Signed)
>  Currently sinus bradycardia at 59 TWI in lead ii/iii/avf.  ST depression in v1/v2/v    Pt continued on eliquis and metoprolol.

## 2022-01-27 NOTE — Hospital Course (Signed)
Admission request for leg swelling/ groin swelling with h/o inguinal hernia and mesh repair in past.  Son at bedside.  Since hernia surgery he has had scrotal swelling. CHF exacerbation.  2+ BLE.  Abd distention.  BNP > 1000. Mild Pulm edema on chest xray/ SOB /DOE.

## 2022-01-27 NOTE — ED Provider Notes (Signed)
[facility] Provider Note    Event Date/Time   First MD Initiated Contact with Patient 01/27/22 2033     (approximate)   History   Groin Swelling and Leg Swelling   Level 5 caveat: History is severely limited by the patient's hearing deficits.   HPI  Marcus Kemp is a 88 y.o. male whose medical history is notable for CHF, mitral valve regurgitation, tricuspid valve regurgitation, and severe hearing limitation.  He presents tonight for evaluation of swelling in his scrotum and his legs.  The patient's son is at bedside and is also helping with the history.  They report that the patient had hernia surgery 2 weeks ago.  Since that time he has gradually started swelling in his legs.  As of the last couple of days the swelling has spread to include his scrotum which has become "the size of a grapefruit" as well as his penis.  His abdomen feels a little bit firm but is nonpainful.  He also reports that he has been increasingly short of breath with exertion, and just getting out of bed or trying to walk to the bathroom make him have a hard time breathing.  No recent fever, cough, nausea, vomiting, nor abdominal pain.     Physical Exam   Triage Vital Signs: ED Triage Vitals  Enc Vitals Group     BP 01/27/22 1512 102/90     Pulse Rate 01/27/22 1512 66     Resp 01/27/22 1512 17     Temp 01/27/22 1512 98.6 F (37 C)     Temp Source 01/27/22 1512 Oral     SpO2 01/27/22 1512 98 %     Weight 01/27/22 1513 69.4 kg (153 lb)     Height 01/27/22 1513 1.676 m ('5\' 6"'$ )     Head Circumference --      Peak Flow --      Pain Score 01/27/22 1512 6     Pain Loc --      Pain Edu? --      Excl. in [place]? --     Most recent vital signs: Vitals:   01/27/22 1512 01/27/22 2137  BP: 102/90 131/78  Pulse: 66 74  Resp: 17 19  Temp: 98.6 F (37 C)   SpO2: 98% 98%     General: Awake, no distress while at rest, but quickly tires when moving. CV:  Good peripheral  perfusion.  Heart sounds are normal. Resp:  Normal effort.  Lungs are clear to auscultation. Abd:  Abdomen is a little bit tense to palpation, consistent with increased volume, but it is nontender to palpation. Other:  Patient has 2+ pitting edema in bilateral lower extremities.  He has extensive excoriations and what appeared to be chronic wounds, but with no active evidence of infection, and his son states that these are may be worse than usual but that they have been there for a long time.  Additionally, he has substantial scrotal edema and some penile edema, but no tenderness to manipulation, no crepitus, no discoloration or suggestion of Fournier's gangrene.   ED Results / Procedures / Treatments   Labs (all labs ordered are listed, but only abnormal results are displayed) Labs Reviewed  CBC WITH DIFFERENTIAL/PLATELET - Abnormal; Notable for the following components:      Result Value   RBC 3.63 (*)    Hemoglobin 12.7 (*)    MCV 109.9 (*)    MCH 35.0 (*)    RDW  17.9 (*)    Platelets 109 (*)    All other components within normal limits  BRAIN NATRIURETIC PEPTIDE - Abnormal; Notable for the following components:   B Natriuretic Peptide 1,028.9 (*)    All other components within normal limits  BASIC METABOLIC PANEL - Abnormal; Notable for the following components:   Calcium 8.6 (*)    GFR, Estimated 59 (*)    All other components within normal limits  TROPONIN I (HIGH SENSITIVITY) - Abnormal; Notable for the following components:   Troponin I (High Sensitivity) 40 (*)    All other components within normal limits  COMPREHENSIVE METABOLIC PANEL  CBC  TROPONIN I (HIGH SENSITIVITY)     EKG  ED ECG REPORT I, Hinda Kehr, the attending physician, personally viewed and interpreted this ECG.  Date: 01/27/2022 EKG Time: 15: 21 Rate: 59 Rhythm: Borderline sinus bradycardia QRS Axis: normal Intervals: normal ST/T Wave abnormalities: Non-specific ST segment / T-wave changes, but  no clear evidence of acute ischemia. Narrative Interpretation: no definitive evidence of acute ischemia; does not meet STEMI criteria.    PROCEDURES:  Critical Care performed: No  Procedures   RADIOLOGY: I viewed and interpreted the patient's 2 view chest x-ray, and it is suggestive of pulmonary edema or at least pulmonary vascular congestion.  Radiology report agrees.  No evidence of infection.   MEDICATIONS ORDERED IN ED: Medications  apixaban (ELIQUIS) tablet 2.5 mg (has no administration in time range)  metoprolol succinate (TOPROL-XL) 24 hr tablet 25 mg (has no administration in time range)  furosemide (LASIX) injection 20 mg (has no administration in time range)  sodium chloride flush (NS) 0.9 % injection 3 mL (has no administration in time range)  acetaminophen (TYLENOL) tablet 650 mg (has no administration in time range)    Or  acetaminophen (TYLENOL) suppository 650 mg (has no administration in time range)  docusate sodium (COLACE) capsule 100 mg (has no administration in time range)  polyethylene glycol (MIRALAX / GLYCOLAX) packet 17 g (has no administration in time range)  bisacodyl (DULCOLAX) EC tablet 5 mg (has no administration in time range)  hydrALAZINE (APRESOLINE) injection 5 mg (has no administration in time range)  furosemide (LASIX) injection 40 mg (40 mg Intravenous Given 01/27/22 2126)     IMPRESSION / MDM / ASSESSMENT AND PLAN / ED COURSE  I reviewed the triage vital signs and the nursing notes.                              Differential diagnosis includes, but is not limited to, volume overload, CHF exacerbation, metabolic or electrolyte abnormality, renal dysfunction, pulmonary edema, acute infectious process such as pneumonia or cellulitis.  Patient's presentation is most consistent with acute presentation with potential threat to life or bodily function.  The patient is awake and alert and in no distress while at rest but has exertional dyspnea.   Labs ordered initially include BMP, BNP, CBC with differential, and high-sensitivity troponin.  Lab results are notable for a BNP of over 1000 suggestive of volume overload/CHF exacerbation.  High-sensitivity troponin is slightly elevated at 40 but he has no chest pain and I suspect this is due to demand ischemia.  Chest x-ray is notable for pulmonary vascular congestion as described above.  X-ray shows no evidence of acute ischemia.  Patient's physical exam is consistent with anasarca and volume overload.  There is no indication that he requires acute imaging as this does  not appear to be a surgical complication.  He has no tenderness to palpation of his abdomen and there is no evidence that this is a surgical complication.  I explained to the patient and his son that he is having a CHF exacerbation and needs admission to the hospital for diuresis.  I ordered furosemide 40 mg IV and I will consult the hospitalist for admission.    Clinical Course as of 01/28/22 0109  Wed Jan 27, 2022  2129 I consulted Dr. Posey Pronto with the hospitalist service.  She agrees with the plan and will admit the patient and see the patient in the emergency department. [CF]    Clinical Course User Index [CF] Hinda Kehr, MD     FINAL CLINICAL IMPRESSION(S) / ED DIAGNOSES   Final diagnoses:  Anasarca  Peripheral edema  Acute on chronic congestive heart failure, unspecified heart failure type ([place])  Scrotal edema     Rx / DC Orders   ED Discharge Orders     None        Note:  This document was prepared using Dragon voice recognition software and may include unintentional dictation errors.   Hinda Kehr, MD 01/28/22 [phone]

## 2022-01-27 NOTE — H&P (Signed)
History and Physical    Patient: Marcus Kemp:[id number] DOB: 03/21/1925 DOA: 01/27/2022 DOS: the patient was seen and examined on 01/28/2022 PCP: Idelle Crouch, MD  Patient coming from: Home  Chief Complaint:  Chief Complaint  Patient presents with   Groin Swelling   Leg Swelling   HPI: Marcus Kemp is a 88 y.o. male with medical history significant of PAF coming with swelling of scrotum and  legs and abdomen.PT has hernia surgery 2 weeks ago and the swelling has been progressive. HPI is limited due to age.   Review of Systems: unable to review all systems due to the inability of the patient to answer questions. Past Medical History:  Diagnosis Date   A-fib [facility])    a.) CHA2DS2-VASc = 4 (age x 2, CHF, aortic plaque). b.) s/p unsuccessful TEE cardioversion 04/28/2016 --> rec'd 200 J x1 and 360 J x 2. c.) rate/rhythm maintained on oral metoprolol succinante; chronically anticoagulated using dose reduced apixaban.   Anemia    Aortic atherosclerosis (HCC)    Arthritis    CHF (congestive heart failure) ([place])    a.) TTE 04/26/2016: EF >55%; mod LVH; MVP; mild-mod LAE, mild MR. b.) TTE 05/14/209: EF >55%; mod RVE; mild BAE; mod MR.TR; triv AR/PR. c.) TTE 03/28/2019: EF >55%; mild LVH; mod BAE; mild PR, mod MR/TR.   GERD (gastroesophageal reflux disease)    Heart murmur    History of kidney stones    HOH (hard of hearing)    a.) hearing aid to LEFT ear.   Hyperlipidemia    Infrarenal abdominal aortic aneurysm (AAA) without rupture ([place]) 05/05/2021   a.) CT 05/05/2021 --> measured 3.0 cm.   MVP (mitral valve prolapse)    Peripheral edema    Pneumonia    Pulmonary HTN ([place]) 04/26/2016   a.) TTE 04/26/2016: EF >55%; PASP 45. b.) TTE 12/06/2017: EF >55%; RVSP 76.6 mmHg. c.) TTE 03/28/2019: EF >55%; RVSP 77.8 mmHg.   Renal cell carcinoma (HCC)    Rheumatic fever    Sepsis ([place])    Valvular heart disease    Past Surgical History:  Procedure Laterality Date   INGUINAL  HERNIA REPAIR Left 01/13/2022   Procedure: HERNIA REPAIR INGUINAL ADULT;  Surgeon: Robert Bellow, MD;  Location: [facility] ORS;  Service: General;  Laterality: Left;   INSERTION OF MESH  01/13/2022   Procedure: INSERTION OF MESH;  Surgeon: Robert Bellow, MD;  Location: [facility] ORS;  Service: General;;   LITHOTRIPSY     TEE WITH CARDIOVERSION N/A    P   TONSILLECTOMY     Social History:  reports that he quit smoking about 83 years ago. His smoking use included cigarettes. He has a 2.00 pack-year smoking history. He has never used smokeless tobacco. He reports that he does not drink alcohol and does not use drugs.  Allergies  Allergen Reactions   Penicillins Anaphylaxis    Family History  Problem Relation Age of Onset   Heart disease Sister    Hypertension Other     Prior to Admission medications   Medication Sig Start Date End Date Taking? Authorizing Provider  acetaminophen (TYLENOL) 500 MG tablet Take 1,000 mg by mouth at bedtime.    [provider]  apixaban (ELIQUIS) 2.5 MG TABS tablet Take 1 tablet (2.5 mg total) by mouth 2 (two) times daily. 01/16/22   Robert Bellow, MD  furosemide (LASIX) 20 MG tablet Take 1 tablet by mouth every morning. 11/24/20  [provider]  HYDROcodone-acetaminophen (NORCO/VICODIN) 5-325 MG tablet Take 1 tablet by mouth every 4 (four) hours as needed for moderate pain. 01/13/22 01/13/23  Robert Bellow, MD  metoprolol succinate (TOPROL-XL) 25 MG 24 hr tablet Take 25 mg by mouth every morning. 05/03/21   [provider]  OVER THE COUNTER MEDICATION Take 2 tablets by mouth at bedtime. Seratame    [provider]  OVER THE COUNTER MEDICATION Take 1 tablet by mouth at bedtime. Relaxium    [provider]  vitamin B-12 (CYANOCOBALAMIN) 500 MCG tablet Take 1 tablet (500 mcg total) by mouth daily. 12/29/21   Earlie Server, MD    Physical Exam: Vitals:   01/27/22 1512 01/27/22 1513 01/27/22 2137  BP: 102/90  131/78   Pulse: 66  74  Resp: 17  19  Temp: 98.6 F (37 C)    TempSrc: Oral    SpO2: 98%  98%  Weight:  69.4 kg   Height:  '5\' 6"'$  (1.676 m)    Physical Exam Vitals reviewed.  Constitutional:      General: He is not in acute distress.    Appearance: Normal appearance. He is well-developed.  HENT:     Head: Normocephalic.     Right Ear: External ear normal.     Left Ear: External ear normal.     Mouth/Throat:     Mouth: Mucous membranes are moist.  Eyes:     Extraocular Movements: Extraocular movements intact.  Cardiovascular:     Rate and Rhythm: Normal rate.  Neurological:     Mental Status: He is alert.     Data Reviewed: Results for orders placed or performed during the hospital encounter of 01/27/22 (from the past 24 hour(s))  CBC with Differential     Status: Abnormal   Collection Time: 01/27/22  3:20 PM  Result Value Ref Range   WBC 6.0 4.0 - 10.5 K/uL   RBC 3.63 (L) 4.22 - 5.81 MIL/uL   Hemoglobin 12.7 (L) 13.0 - 17.0 g/dL   HCT 39.9 39.0 - 52.0 %   MCV 109.9 (H) 80.0 - 100.0 fL   MCH 35.0 (H) 26.0 - 34.0 pg   MCHC 31.8 30.0 - 36.0 g/dL   RDW 17.9 (H) 11.5 - 15.5 %   Platelets 109 (L) 150 - 400 K/uL   nRBC 0.0 0.0 - 0.2 %   Neutrophils Relative % 68 %   Neutro Abs 4.1 1.7 - 7.7 K/uL   Lymphocytes Relative 20 %   Lymphs Abs 1.2 0.7 - 4.0 K/uL   Monocytes Relative 9 %   Monocytes Absolute 0.6 0.1 - 1.0 K/uL   Eosinophils Relative 2 %   Eosinophils Absolute 0.1 0.0 - 0.5 K/uL   Basophils Relative 1 %   Basophils Absolute 0.1 0.0 - 0.1 K/uL   Immature Granulocytes 0 %   Abs Immature Granulocytes 0.02 0.00 - 0.07 K/uL  Brain natriuretic peptide     Status: Abnormal   Collection Time: 01/27/22  3:20 PM  Result Value Ref Range   B Natriuretic Peptide 1,028.9 (H) 0.0 - 100.0 pg/mL  Basic metabolic panel     Status: Abnormal   Collection Time: 01/27/22  3:20 PM  Result Value Ref Range   Sodium 137 135 - 145 mmol/L   Potassium 4.6 3.5 - 5.1 mmol/L   Chloride 103  98 - 111 mmol/L   CO2 26 22 - 32 mmol/L   Glucose, Bld 85 70 - 99 mg/dL  BUN 23 8 - 23 mg/dL   Creatinine, Ser 1.15 0.61 - 1.24 mg/dL   Calcium 8.6 (L) 8.9 - 10.3 mg/dL   GFR, Estimated 59 (L) >60 mL/min   Anion gap 8 5 - 15  Troponin I (High Sensitivity)     Status: Abnormal   Collection Time: 01/27/22  3:20 PM  Result Value Ref Range   Troponin I (High Sensitivity) 40 (H) <18 ng/L     Assessment and Plan: * Anasarca secondary to volume overload form CHF. elevation and diuresis.    Macrocytosis We will check b12/foalte if anemia is nutritional or iron def.    Anemia Hemoglobin trend.  04/22/16 09:25 04/23/16 03:39 04/24/16 05:18 05/10/16 18:00 05/18/21 15:54 12/29/21 09:46 01/27/22 15:20  Hemoglobin 12.5 (L) 11.6 (L) 11.3 (L) 12.1 (L) 13.7 12.6 (L) 12.7 (L)  > in light of eliquis and chronic anemia from possible underlying bleeding, and  is putting strain on his heart . >Iron panel. Type/screen. >iv ppi/ stool guaiac.      Longstanding persistent atrial fibrillation (HCC) >Currently sinus bradycardia at 59 TWI in lead ii/iii/avf.  ST depression in v1/v2/v    Pt continued on eliquis and metoprolol.        Acute on chronic diastolic CHF (congestive heart failure) ([place]) a.) TTE 04/26/2016: EF >55%; mod LVH; MVP; mild-mod LAE, mild MR. b.) TTE 05/14/209: EF >55%; mod RVE; mild BAE; mod MR.TR; triv AR/PR. c.) TTE 03/28/2019: EF >55%; mild LVH; mod BAE; mild PR, mod MR/TR. We will obtain a 2 d echo and os having volume overload for chf exacerbation.  Strict I/o. Daily weights. Electrolytes and kidney function monitoring.  Cardiology consult as deemed appropriate.  We will give lasix 20 mg iv q12 for next 3 doses.          Advance Care Planning:    Code Status: Full Code    Consults:  None   Family Communication:  Bradby,BETTY (Spouse)  [phone] ([place])  Severity of Illness: The appropriate patient status for this patient is OBSERVATION.  Observation status is judged to be reasonable and necessary in order to provide the required intensity of service to ensure the patient's safety. The patient's presenting symptoms, physical exam findings, and initial radiographic and laboratory data in the context of their medical condition is felt to place them at decreased risk for further clinical deterioration. Furthermore, it is anticipated that the patient will be medically stable for discharge from the hospital within 2 midnights of admission.   Author: Para Skeans, MD 01/28/2022 2:17 AM  For on call review [email or web address].

## 2022-01-27 NOTE — Assessment & Plan Note (Addendum)
a.) TTE 04/26/2016: EF >55%; mod LVH; MVP; mild-mod LAE, mild MR. b.) TTE 05/14/209: EF >55%; mod RVE; mild BAE; mod MR.TR; triv AR/PR. c.) TTE 03/28/2019: EF >55%; mild LVH; mod BAE; mild PR, mod MR/TR. We will obtain a 2 d echo and os having volume overload for chf exacerbation.  Strict I/o. Daily weights. Electrolytes and kidney function monitoring.  Cardiology consult as deemed appropriate.  We will give lasix 20 mg iv q12 for next 3 doses.

## 2022-01-27 NOTE — Assessment & Plan Note (Signed)
Hemoglobin trend.  04/22/16 09:25 04/23/16 03:39 04/24/16 05:18 05/10/16 18:00 05/18/21 15:54 12/29/21 09:46 01/27/22 15:20  Hemoglobin 12.5 (L) 11.6 (L) 11.3 (L) 12.1 (L) 13.7 12.6 (L) 12.7 (L)  > in light of eliquis and chronic anemia from possible underlying bleeding, and  is putting strain on his heart . >Iron panel. Type/screen. >iv ppi/ stool guaiac.

## 2022-01-27 NOTE — Assessment & Plan Note (Signed)
We will check b12/foalte if anemia is nutritional or iron def.

## 2022-01-27 NOTE — ED Provider Triage Note (Signed)
Emergency Medicine Provider Triage Evaluation Note  Marcus Kemp , a 88 y.o. male  was evaluated in triage.  Pt complains of bilateral leg swelling x2 weeks. Also has swelling in his testicles. Had right sided inguinal hernia repair on 01/13/22. No SOB. On lasix. Does have DOE which is new.  Review of Systems  Positive: Leg swelling, DOE. Negative: CP  Physical Exam  There were no vitals taken for this visit. Gen:   Awake, no distress   Resp:  Normal effort  MSK:   Moves extremities without difficulty  Other:  Pitting edema to BLE  Medical Decision Making  Medically screening exam initiated at 3:09 PM.  Appropriate orders placed.  Marcus Kemp was informed that the remainder of the evaluation will be completed by another provider, this initial triage assessment does not replace that evaluation, and the importance of remaining in the ED until their evaluation is complete.    Marcus Old, PA-C 01/27/22 1518

## 2022-01-27 NOTE — ED Triage Notes (Signed)
Pt sent from [facility] with wife, pt reports that he began having swelling to BLE and into testicles after having hernia surgery 2 weeks ago. Pt reports discomfort to testicles.

## 2022-01-28 ENCOUNTER — Observation Stay
Admit: 2022-01-28 | Discharge: 2022-01-28 | Disposition: A | Discharge status: Home / Self Care | Payer: Medicare Other | Attending: Internal Medicine | Admitting: Internal Medicine

## 2022-01-28 ENCOUNTER — Other Ambulatory Visit: Payer: Self-pay

## 2022-01-28 DIAGNOSIS — R601 Generalized edema: Secondary | ICD-10-CM

## 2022-01-28 LAB — COMPREHENSIVE METABOLIC PANEL
ALT: 19 U/L (ref 0–44)
AST: 36 U/L (ref 15–41)
Albumin: 3 g/dL — ABNORMAL LOW (ref 3.5–5.0)
Alkaline Phosphatase: 92 U/L (ref 38–126)
Anion gap: 4 — ABNORMAL LOW (ref 5–15)
BUN: 24 mg/dL — ABNORMAL HIGH (ref 8–23)
CO2: 29 mmol/L (ref 22–32)
Calcium: 8.4 mg/dL — ABNORMAL LOW (ref 8.9–10.3)
Chloride: 107 mmol/L (ref 98–111)
Creatinine, Ser: 1.02 mg/dL (ref 0.61–1.24)
GFR, Estimated: >60 mL/min (ref >60)
Glucose, Bld: 86 mg/dL (ref 70–99)
Potassium: 4.4 mmol/L (ref 3.5–5.1)
Sodium: 140 mmol/L (ref 135–145)
Total Bilirubin: 1.8 mg/dL — ABNORMAL HIGH (ref 0.3–1.2)
Total Protein: 6.3 g/dL — ABNORMAL LOW (ref 6.5–8.1)

## 2022-01-28 LAB — CBC
HCT: 38.3 % — ABNORMAL LOW (ref 39.0–52.0)
Hemoglobin: 12.7 g/dL — ABNORMAL LOW (ref 13.0–17.0)
MCH: 36 pg — ABNORMAL HIGH (ref 26.0–34.0)
MCHC: 33.2 g/dL (ref 30.0–36.0)
MCV: 108.5 fL — ABNORMAL HIGH (ref 80.0–100.0)
Platelets: 103 10*3/uL — ABNORMAL LOW (ref 150–400)
RBC: 3.53 MIL/uL — ABNORMAL LOW (ref 4.22–5.81)
RDW: 17.6 % — ABNORMAL HIGH (ref 11.5–15.5)
WBC: 5.8 10*3/uL (ref 4.0–10.5)
nRBC: 0 % (ref 0.0–0.2)

## 2022-01-28 LAB — TROPONIN I (HIGH SENSITIVITY): Troponin I (High Sensitivity): 44 ng/L — ABNORMAL HIGH (ref <18)

## 2022-01-28 LAB — ECHOCARDIOGRAM COMPLETE
AR max vel: 2.32 cm2
AV Area VTI: 2.41 cm2
AV Area mean vel: 2.69 cm2
AV Mean grad: 3 mmHg
AV Peak grad: 6.5 mmHg
Ao pk vel: 1.27 m/s
Area-P 1/2: 3.7 cm2
Height: 66 in
MV VTI: 1.64 cm2
S' Lateral: 2.57 cm
Weight: 2448 oz

## 2022-01-28 MED ORDER — FUROSEMIDE 20 MG PO TABS: 40.0000 mg | ORAL_TABLET | ORAL | 0 refills | Status: DC

## 2022-01-28 NOTE — Discharge Summary (Signed)
Physician Discharge Summary  Marcus Kemp SHF:[id number] DOB: 02-22-1925 DOA: 01/27/2022  PCP: Idelle Crouch, MD  Admit date: 01/27/2022 Discharge date: 01/28/2022  Admitted From: Home Disposition: Home  Recommendations for Outpatient Follow-up:  Follow up with PCP in 1-2 weeks Please obtain BMP/CBC in one week Keep your scheduled follow-up with cardiology next week.  We will send echocardiogram results to them.  Home Health: N/A Equipment/Devices: N/A  Discharge Condition: Stable CODE STATUS: Full code Diet recommendation: Low-salt diet  Discharge summary: 88 year old gentleman with history of paroxysmal atrial fibrillation, chronic diastolic dysfunction on Lasix 20 mg daily at home who underwent left inguinal hernia repair 2 weeks ago started having scrotal swelling and redness since the surgery.  Was seen at primary care office yesterday, sent to ER for evaluation as patient also complained of getting more shortness of breath and feet swelling since surgery.  In the emergency room, hemodynamically stable.  On room air.  Scrotum was swollen without evidence of complications.  2+ edema dorsum of the feet, chronic edema and skin changes of the legs.  BNP was 1000.  Patient was on room air.  Patient was given 1 dose of IV Lasix, monitored in the hospital.  He is already improving.  Able to get up and walk around.  2D echocardiogram with normal ejection fraction, grade 3 diastolic dysfunction.  No pulmonary symptoms.  Scrotal edema and swelling secondary to inguinal surgery, expected from inguinal hernia repair, dependent edema .  No evidence of infection, no evidence of cellulitis or Fournier's gangrene. Plan: Already improving.  Scrotal support, brief demonstrated and advised to use it at home.  Acute on chronic diastolic congestive heart failure: Mild symptoms.  Unsure how much is chronic edema.  BNP was elevated. Patient is fairly stable and able to mobilize around after 1 dose of  IV Lasix in the ER.  Willing to go home if can be treated with oral diuretics.  Because of medical stability, will discharge him home. Lasix increased from 20 mg daily to 40 mg daily. Already on metoprolol. Sinus rhythm and therapeutic on Eliquis. He has a scheduled follow-up with cardiology next week, continue to take 40 mg Lasix in the morning until follow-up.  Stable for discharge.    Discharge Diagnoses:  Principal Problem:   Anasarca Active Problems:   Acute on chronic diastolic CHF (congestive heart failure) (HCC)   Longstanding persistent atrial fibrillation (HCC)   Anemia   Macrocytosis    Discharge Instructions  Discharge Instructions     Call MD for:  difficulty breathing, headache or visual disturbances   Complete by: As directed    Diet - low sodium heart healthy   Complete by: As directed    Increase activity slowly   Complete by: As directed       Allergies as of 01/28/2022       Reactions   Penicillins Anaphylaxis        Medication List     STOP taking these medications    HYDROcodone-acetaminophen 5-325 MG tablet Commonly known as: NORCO/VICODIN       TAKE these medications    acetaminophen 500 MG tablet Commonly known as: TYLENOL Take 1,000 mg by mouth at bedtime.   apixaban 2.5 MG Tabs tablet Commonly known as: ELIQUIS Take 1 tablet (2.5 mg total) by mouth 2 (two) times daily.   Azelastine HCl 137 MCG/SPRAY Soln SMARTSIG:2 Puff(s) Both Nares Twice Daily   cetirizine 10 MG tablet Commonly known as: ZYRTEC SMARTSIG:1 pill  By Mouth Daily PRN   furosemide 20 MG tablet Commonly known as: LASIX Take 2 tablets (40 mg total) by mouth every morning. What changed: how much to take   metoprolol succinate 25 MG 24 hr tablet Commonly known as: TOPROL-XL Take 25 mg by mouth every morning.   OVER THE COUNTER MEDICATION Take 2 tablets by mouth at bedtime. Seratame   OVER THE COUNTER MEDICATION Take 1 tablet by mouth at bedtime.  Relaxium   vitamin B-12 500 MCG tablet Commonly known as: CYANOCOBALAMIN Take 1 tablet (500 mcg total) by mouth daily.        Allergies  Allergen Reactions   Penicillins Anaphylaxis    Consultations: None   Procedures/Studies: ECHOCARDIOGRAM COMPLETE  Result Date: 01/28/2022    ECHOCARDIOGRAM REPORT   Patient Name:   Marcus Kemp Date of Exam: 01/28/2022 Medical Rec #:  [id number]         Height:       66.0 in Accession #:    [id number]        Weight:       153.0 lb Date of Birth:  1925/03/13        BSA:          1.785 m Patient Age:    47 years          BP:           97/77 mmHg Patient Gender: M                 HR:           72 bpm. Exam Location:  [facility] Procedure: 2D Echo, Color Doppler and Cardiac Doppler Indications:     I50.21 congestive heart failure-Acute Systolic  History:         Patient has prior history of Echocardiogram examinations.                  Arrythmias:Atrial Fibrillation, Signs/Symptoms:Edema; Risk                  Factors:Dyslipidemia.  Sonographer:     Charmayne Sheer Referring Phys:  [place] Diagnosing Phys: Donnelly Angelica IMPRESSIONS  1. Left ventricular ejection fraction, by estimation, is 55 to 60%. The left ventricle has normal function. The left ventricle has no regional wall motion abnormalities. There is mild left ventricular hypertrophy. Left ventricular diastolic parameters are consistent with Grade III diastolic dysfunction (restrictive).  2. Right ventricular systolic function is moderately reduced. The right ventricular size is moderately enlarged.  3. Left atrial size was mildly dilated.  4. Right atrial size was mildly dilated.  5. The mitral valve is degenerative. Moderate mitral valve regurgitation. Mild mitral stenosis. The mean mitral valve gradient is 4.0 mmHg.  6. The aortic valve is calcified. Aortic valve regurgitation is not visualized. No aortic stenosis is present.  7. Aortic dilatation noted. There is mild dilatation of the ascending aorta,  measuring 38 mm.  8. The inferior vena cava is dilated in size with <50% respiratory variability, suggesting right atrial pressure of 15 mmHg. FINDINGS  Left Ventricle: Left ventricular ejection fraction, by estimation, is 55 to 60%. The left ventricle has normal function. The left ventricle has no regional wall motion abnormalities. The left ventricular internal cavity size was normal in size. There is  mild left ventricular hypertrophy. Left ventricular diastolic parameters are consistent with Grade III diastolic dysfunction (restrictive). Right Ventricle: The right ventricular size is moderately enlarged. Right vetricular wall thickness was  not well visualized. Right ventricular systolic function is moderately reduced. Left Atrium: Left atrial size was mildly dilated. Right Atrium: Right atrial size was mildly dilated. Pericardium: There is no evidence of pericardial effusion. Mitral Valve: The mitral valve is degenerative in appearance. There is mild thickening of the mitral valve leaflet(s). Mild mitral annular calcification. Moderate mitral valve regurgitation. Mild mitral valve stenosis. MV peak gradient, 13.5 mmHg. The mean mitral valve gradient is 4.0 mmHg. Tricuspid Valve: The tricuspid valve is normal in structure. Tricuspid valve regurgitation is mild. Aortic Valve: The aortic valve is calcified. Aortic valve regurgitation is not visualized. No aortic stenosis is present. Aortic valve mean gradient measures 3.0 mmHg. Aortic valve peak gradient measures 6.5 mmHg. Aortic valve area, by VTI measures 2.41 cm. Pulmonic Valve: The pulmonic valve was not well visualized. Pulmonic valve regurgitation is not visualized. No evidence of pulmonic stenosis. Aorta: Aortic dilatation noted. There is mild dilatation of the ascending aorta, measuring 38 mm. Venous: The inferior vena cava is dilated in size with less than 50% respiratory variability, suggesting right atrial pressure of 15 mmHg. IAS/Shunts: No atrial  level shunt detected by color flow Doppler.  LEFT VENTRICLE PLAX 2D LVIDd:         3.91 cm   Diastology LVIDs:         2.57 cm   LV e' medial:    3.70 cm/s LV PW:         1.22 cm   LV E/e' medial:  45.7 LV IVS:        1.08 cm   LV e' lateral:   7.72 cm/s LVOT diam:     2.10 cm   LV E/e' lateral: 21.9 LV SV:         57 LV SV Index:   32 LVOT Area:     3.46 cm  RIGHT VENTRICLE RV Basal diam:  4.12 cm LEFT ATRIUM             Index        RIGHT ATRIUM           Index LA diam:        4.40 cm 2.47 cm/m   RA Area:     22.20 cm LA Vol (A2C):   50.2 ml 28.13 ml/m  RA Volume:   69.00 ml  38.66 ml/m LA Vol (A4C):   62.0 ml 34.74 ml/m LA Biplane Vol: 56.7 ml 31.77 ml/m  AORTIC VALVE                    PULMONIC VALVE AV Area (Vmax):    2.32 cm     PV Vmax:       0.92 m/s AV Area (Vmean):   2.69 cm     PV Peak grad:  3.4 mmHg AV Area (VTI):     2.41 cm AV Vmax:           127.00 cm/s AV Vmean:          75.600 cm/s AV VTI:            0.239 m AV Peak Grad:      6.5 mmHg AV Mean Grad:      3.0 mmHg LVOT Vmax:         85.00 cm/s LVOT Vmean:        58.700 cm/s LVOT VTI:          0.166 m LVOT/AV VTI ratio: 0.69  AORTA Ao Root diam: 3.60 cm  MITRAL VALVE MV Area (PHT): 3.70 cm     SHUNTS MV Area VTI:   1.64 cm     Systemic VTI:  0.17 m MV Peak grad:  13.5 mmHg    Systemic Diam: 2.10 cm MV Mean grad:  4.0 mmHg MV Vmax:       1.84 m/s MV Vmean:      76.5 cm/s MV Decel Time: 205 msec MV E velocity: 169.00 cm/s MV A velocity: 47.40 cm/s MV E/A ratio:  3.57 Donnelly Angelica Electronically signed by Donnelly Angelica Signature Date/Time: 01/28/2022/1:40:49 PM    Final    DG Chest 2 View  Result Date: 01/27/2022 CLINICAL DATA:  Dyspnea on exertion, recent hernia surgery EXAM: CHEST - 2 VIEW COMPARISON:  04/22/2016 FINDINGS: Cardiomegaly. Mild, diffuse bilateral interstitial opacity with small bilateral pleural effusions. The visualized skeletal structures are unremarkable. IMPRESSION: Cardiomegaly with mild, diffuse bilateral interstitial  opacity and small bilateral pleural effusions, most consistent with edema. No focal airspace opacity. Electronically Signed   By: Delanna Ahmadi M.D.   On: 01/27/2022 15:41   (Echo, Carotid, EGD, Colonoscopy, ERCP)    Subjective: Patient seen and examined in the morning rounds.  I went to examine patient again in the afternoon with wife at the bedside to discuss about his status.  He was able to walk around the hallway with use of a walker.  Wife at the bedside stated that he is at about his baseline. Scrotum swelling has improved since last night and patient feels more comfortable. Patient and wife requested that they would prefer to go home and treated at home if possible.  Discussed about increasing dose of Lasix and patient already has cardiology appointment next week.   Discharge Exam: Vitals:   01/28/22 1101 01/28/22 1300  BP:  (!) 106/53  Pulse: (!) 59 60  Resp:  17  Temp:    SpO2: 97% 97%   Vitals:   01/28/22 0830 01/28/22 1100 01/28/22 1101 01/28/22 1300  BP: 130/75 98/60  (!) 106/53  Pulse: 73 66 (!) 59 60  Resp: '16 16  17  '$ Temp:      TempSrc:      SpO2: 95% 90% 97% 97%  Weight:      Height:        General: Pt is alert, awake, not in acute distress Hard of hearing.  Comfortable.  Interactive. Cardiovascular: RRR, S1/S2 +, no rubs, no gallops Respiratory: No added sounds. Abdominal: Soft, NT, ND, bowel sounds + Left groin surgical scar clean and dry.  No redness or erythema. Scrotum with mild redness, increased in size but no pitting edema, no erythema or induration.  Nontender. Extremities: Chronic scaly skin, multiple pruritic lesions, nonpitting edema of the leg, 1+ pitting edema dorsum of the foot.    The results of significant diagnostics from this hospitalization (including imaging, microbiology, ancillary and laboratory) are listed below for reference.     Microbiology: No results found for this or any previous visit (from the past 240 hour(s)).    Labs: BNP (last 3 results) Recent Labs    01/27/22 1520  BNP 4,709.6*   Basic Metabolic Panel: Recent Labs  Lab 01/27/22 1520 01/28/22 0442  NA 137 140  K 4.6 4.4  CL 103 107  CO2 26 29  GLUCOSE 85 86  BUN 23 24*  CREATININE 1.15 1.02  CALCIUM 8.6* 8.4*   Liver Function Tests: Recent Labs  Lab 01/28/22 0442  AST 36  ALT 19  ALKPHOS 92  BILITOT 1.8*  PROT 6.3*  ALBUMIN 3.0*   No results for input(s): "LIPASE", "AMYLASE" in the last 168 hours. No results for input(s): "AMMONIA" in the last 168 hours. CBC: Recent Labs  Lab 01/27/22 1520 01/28/22 0442  WBC 6.0 5.8  NEUTROABS 4.1  --   HGB 12.7* 12.7*  HCT 39.9 38.3*  MCV 109.9* 108.5*  PLT 109* 103*   Cardiac Enzymes: No results for input(s): "CKTOTAL", "CKMB", "CKMBINDEX", "TROPONINI" in the last 168 hours. BNP: Invalid input(s): "POCBNP" CBG: No results for input(s): "GLUCAP" in the last 168 hours. D-Dimer No results for input(s): "DDIMER" in the last 72 hours. Hgb A1c No results for input(s): "HGBA1C" in the last 72 hours. Lipid Profile No results for input(s): "CHOL", "HDL", "LDLCALC", "TRIG", "CHOLHDL", "LDLDIRECT" in the last 72 hours. Thyroid function studies No results for input(s): "TSH", "T4TOTAL", "T3FREE", "THYROIDAB" in the last 72 hours.  Invalid input(s): "FREET3" Anemia work up No results for input(s): "VITAMINB12", "FOLATE", "FERRITIN", "TIBC", "IRON", "RETICCTPCT" in the last 72 hours. Urinalysis No results found for: "COLORURINE", "APPEARANCEUR", "LABSPEC", "PHURINE", "GLUCOSEU", "HGBUR", "BILIRUBINUR", "KETONESUR", "PROTEINUR", "UROBILINOGEN", "NITRITE", "LEUKOCYTESUR" Sepsis Labs Recent Labs  Lab 01/27/22 1520 01/28/22 0442  WBC 6.0 5.8   Microbiology No results found for this or any previous visit (from the past 240 hour(s)).   Time coordinating discharge: 35 minutes  SIGNED:   Barb Merino, MD  [facility] 01/28/2022, 2:06 PM

## 2022-01-28 NOTE — Progress Notes (Signed)
IV removed. Discharge instructions given to patient and wife. Verbalized understanding. No acute distress at this time. Wife to transport patient home.

## 2022-01-28 NOTE — H&P (Signed)
History and Physical    Patient: Marcus Kemp:[id number] DOB: 06-15-1925 DOA: 01/27/2022 DOS: the patient was seen and examined on 01/28/2022 PCP: Idelle Crouch, MD  Patient coming from: Home  Chief Complaint:  Chief Complaint  Patient presents with   Groin Swelling   Leg Swelling   HPI: Marcus Kemp is a 88 y.o. male with medical history significant of PAF coming with swelling of scrotum and  legs and abdomen.PT has hernia surgery 2 weeks ago and the swelling has been progressive. HPI is limited due to age.   Review of Systems: unable to review all systems due to the inability of the patient to answer questions. Past Medical History:  Diagnosis Date   A-fib [facility])    a.) CHA2DS2-VASc = 4 (age x 2, CHF, aortic plaque). b.) s/p unsuccessful TEE cardioversion 04/28/2016 --> rec'd 200 J x1 and 360 J x 2. c.) rate/rhythm maintained on oral metoprolol succinante; chronically anticoagulated using dose reduced apixaban.   Anemia    Aortic atherosclerosis (HCC)    Arthritis    CHF (congestive heart failure) ([place])    a.) TTE 04/26/2016: EF >55%; mod LVH; MVP; mild-mod LAE, mild MR. b.) TTE 05/14/209: EF >55%; mod RVE; mild BAE; mod MR.TR; triv AR/PR. c.) TTE 03/28/2019: EF >55%; mild LVH; mod BAE; mild PR, mod MR/TR.   GERD (gastroesophageal reflux disease)    Heart murmur    History of kidney stones    HOH (hard of hearing)    a.) hearing aid to LEFT ear.   Hyperlipidemia    Infrarenal abdominal aortic aneurysm (AAA) without rupture ([place]) 05/05/2021   a.) CT 05/05/2021 --> measured 3.0 cm.   MVP (mitral valve prolapse)    Peripheral edema    Pneumonia    Pulmonary HTN ([place]) 04/26/2016   a.) TTE 04/26/2016: EF >55%; PASP 45. b.) TTE 12/06/2017: EF >55%; RVSP 76.6 mmHg. c.) TTE 03/28/2019: EF >55%; RVSP 77.8 mmHg.   Renal cell carcinoma (HCC)    Rheumatic fever    Sepsis ([place])    Valvular heart disease    Past Surgical History:  Procedure Laterality Date   INGUINAL  HERNIA REPAIR Left 01/13/2022   Procedure: HERNIA REPAIR INGUINAL ADULT;  Surgeon: Robert Bellow, MD;  Location: [facility] ORS;  Service: General;  Laterality: Left;   INSERTION OF MESH  01/13/2022   Procedure: INSERTION OF MESH;  Surgeon: Robert Bellow, MD;  Location: [facility] ORS;  Service: General;;   LITHOTRIPSY     TEE WITH CARDIOVERSION N/A    P   TONSILLECTOMY     Social History:  reports that he quit smoking about 83 years ago. His smoking use included cigarettes. He has a 2.00 pack-year smoking history. He has never used smokeless tobacco. He reports that he does not drink alcohol and does not use drugs.  Allergies  Allergen Reactions   Penicillins Anaphylaxis    Family History  Problem Relation Age of Onset   Heart disease Sister    Hypertension Other     Prior to Admission medications   Medication Sig Start Date End Date Taking? Authorizing Provider  acetaminophen (TYLENOL) 500 MG tablet Take 1,000 mg by mouth at bedtime.    [provider]  apixaban (ELIQUIS) 2.5 MG TABS tablet Take 1 tablet (2.5 mg total) by mouth 2 (two) times daily. 01/16/22   Robert Bellow, MD  furosemide (LASIX) 20 MG tablet Take 1 tablet by mouth every morning. 11/24/20  [provider]  HYDROcodone-acetaminophen (NORCO/VICODIN) 5-325 MG tablet Take 1 tablet by mouth every 4 (four) hours as needed for moderate pain. 01/13/22 01/13/23  Robert Bellow, MD  metoprolol succinate (TOPROL-XL) 25 MG 24 hr tablet Take 25 mg by mouth every morning. 05/03/21   [provider]  OVER THE COUNTER MEDICATION Take 2 tablets by mouth at bedtime. Seratame    [provider]  OVER THE COUNTER MEDICATION Take 1 tablet by mouth at bedtime. Relaxium    [provider]  vitamin B-12 (CYANOCOBALAMIN) 500 MCG tablet Take 1 tablet (500 mcg total) by mouth daily. 12/29/21   Earlie Server, MD    Physical Exam: Vitals:   01/27/22 1512 01/27/22 1513 01/27/22 2137  BP: 102/90  131/78   Pulse: 66  74  Resp: 17  19  Temp: 98.6 F (37 C)    TempSrc: Oral    SpO2: 98%  98%  Weight:  69.4 kg   Height:  '5\' 6"'$  (1.676 m)    Physical Exam Vitals reviewed.  Constitutional:      General: He is not in acute distress.    Appearance: Normal appearance. He is well-developed.  HENT:     Head: Normocephalic.     Right Ear: External ear normal.     Left Ear: External ear normal.     Mouth/Throat:     Mouth: Mucous membranes are moist.  Eyes:     Extraocular Movements: Extraocular movements intact.  Cardiovascular:     Rate and Rhythm: Normal rate and regular rhythm.     Pulses: Normal pulses.     Heart sounds: Normal heart sounds.  Pulmonary:     Effort: Pulmonary effort is normal.     Breath sounds: Normal breath sounds.  Abdominal:     General: Bowel sounds are normal. There is no distension.     Palpations: Abdomen is soft.     Tenderness: There is no abdominal tenderness.  Skin:    General: Skin is warm.  Neurological:     General: No focal deficit present.     Mental Status: He is alert.  Psychiatric:        Mood and Affect: Mood normal.     Data Reviewed: Results for orders placed or performed during the hospital encounter of 01/27/22 (from the past 24 hour(s))  CBC with Differential     Status: Abnormal   Collection Time: 01/27/22  3:20 PM  Result Value Ref Range   WBC 6.0 4.0 - 10.5 K/uL   RBC 3.63 (L) 4.22 - 5.81 MIL/uL   Hemoglobin 12.7 (L) 13.0 - 17.0 g/dL   HCT 39.9 39.0 - 52.0 %   MCV 109.9 (H) 80.0 - 100.0 fL   MCH 35.0 (H) 26.0 - 34.0 pg   MCHC 31.8 30.0 - 36.0 g/dL   RDW 17.9 (H) 11.5 - 15.5 %   Platelets 109 (L) 150 - 400 K/uL   nRBC 0.0 0.0 - 0.2 %   Neutrophils Relative % 68 %   Neutro Abs 4.1 1.7 - 7.7 K/uL   Lymphocytes Relative 20 %   Lymphs Abs 1.2 0.7 - 4.0 K/uL   Monocytes Relative 9 %   Monocytes Absolute 0.6 0.1 - 1.0 K/uL   Eosinophils Relative 2 %   Eosinophils Absolute 0.1 0.0 - 0.5 K/uL   Basophils Relative 1 %    Basophils Absolute 0.1 0.0 - 0.1 K/uL   Immature Granulocytes 0 %   Abs Immature Granulocytes  0.02 0.00 - 0.07 K/uL  Brain natriuretic peptide     Status: Abnormal   Collection Time: 01/27/22  3:20 PM  Result Value Ref Range   B Natriuretic Peptide 1,028.9 (H) 0.0 - 100.0 pg/mL  Basic metabolic panel     Status: Abnormal   Collection Time: 01/27/22  3:20 PM  Result Value Ref Range   Sodium 137 135 - 145 mmol/L   Potassium 4.6 3.5 - 5.1 mmol/L   Chloride 103 98 - 111 mmol/L   CO2 26 22 - 32 mmol/L   Glucose, Bld 85 70 - 99 mg/dL   BUN 23 8 - 23 mg/dL   Creatinine, Ser 1.15 0.61 - 1.24 mg/dL   Calcium 8.6 (L) 8.9 - 10.3 mg/dL   GFR, Estimated 59 (L) >60 mL/min   Anion gap 8 5 - 15  Troponin I (High Sensitivity)     Status: Abnormal   Collection Time: 01/27/22  3:20 PM  Result Value Ref Range   Troponin I (High Sensitivity) 40 (H) <18 ng/L     Assessment and Plan: * Anasarca secondary to volume overload form CHF. elevation and diuresis.    Macrocytosis We will check b12/foalte if anemia is nutritional or iron def.    Anemia Hemoglobin trend.  04/22/16 09:25 04/23/16 03:39 04/24/16 05:18 05/10/16 18:00 05/18/21 15:54 12/29/21 09:46 01/27/22 15:20  Hemoglobin 12.5 (L) 11.6 (L) 11.3 (L) 12.1 (L) 13.7 12.6 (L) 12.7 (L)  > in light of eliquis and chronic anemia from possible underlying bleeding, and  is putting strain on his heart . >Iron panel. Type/screen. >iv ppi/ stool guaiac.      Longstanding persistent atrial fibrillation (HCC) >Currently sinus bradycardia at 59 TWI in lead ii/iii/avf.  ST depression in v1/v2/v    Pt continued on eliquis and metoprolol.        Acute on chronic diastolic CHF (congestive heart failure) ([place]) a.) TTE 04/26/2016: EF >55%; mod LVH; MVP; mild-mod LAE, mild MR. b.) TTE 05/14/209: EF >55%; mod RVE; mild BAE; mod MR.TR; triv AR/PR. c.) TTE 03/28/2019: EF >55%; mild LVH; mod BAE; mild PR, mod MR/TR. We will obtain a 2 d echo and  os having volume overload for chf exacerbation.  Strict I/o. Daily weights. Electrolytes and kidney function monitoring.  Cardiology consult as deemed appropriate.  We will give lasix 20 mg iv q12 for next 3 doses.          Advance Care Planning:    Code Status: Full Code    Consults:  None   Family Communication:  Zwicker,BETTY (Spouse)  [phone] (Mobile)  Severity of Illness: The appropriate patient status for this patient is OBSERVATION. Observation status is judged to be reasonable and necessary in order to provide the required intensity of service to ensure the patient's safety. The patient's presenting symptoms, physical exam findings, and initial radiographic and laboratory data in the context of their medical condition is felt to place them at decreased risk for further clinical deterioration. Furthermore, it is anticipated that the patient will be medically stable for discharge from the hospital within 2 midnights of admission.   Author: Para Skeans, MD 01/28/2022 2:23 AM  For on call review [email or web address].

## 2022-01-28 NOTE — Progress Notes (Signed)
*  PRELIMINARY RESULTS* Echocardiogram 2D Echocardiogram has been performed.  Marcus Kemp 01/28/2022, 8:59 AM

## 2022-01-28 NOTE — Progress Notes (Signed)
Patient ambulated in hallways with walker and standby assist. Tolerated well.

## 2022-02-03 ENCOUNTER — Other Ambulatory Visit
Admission: RE | Admit: 2022-02-03 | Discharge: 2022-02-03 | Disposition: A | Discharge status: Home / Self Care | Payer: Medicare Other | Source: Ambulatory Visit | Attending: Physician Assistant | Admitting: Physician Assistant

## 2022-02-03 DIAGNOSIS — Z09 Encounter for follow-up examination after completed treatment for conditions other than malignant neoplasm: Secondary | ICD-10-CM | POA: Diagnosis present

## 2022-02-03 DIAGNOSIS — Z79899 Other long term (current) drug therapy: Secondary | ICD-10-CM | POA: Diagnosis present

## 2022-02-03 DIAGNOSIS — I5032 Chronic diastolic (congestive) heart failure: Secondary | ICD-10-CM | POA: Diagnosis present

## 2022-02-03 DIAGNOSIS — R609 Edema, unspecified: Secondary | ICD-10-CM | POA: Diagnosis present

## 2022-02-03 LAB — BRAIN NATRIURETIC PEPTIDE: B Natriuretic Peptide: 494.5 pg/mL — ABNORMAL HIGH (ref 0.0–100.0)

## 2022-02-12 ENCOUNTER — Ambulatory Visit: Payer: Medicare Other | Admitting: Family

## 2022-09-09 ENCOUNTER — Emergency Department: Payer: Medicare Other

## 2022-09-09 ENCOUNTER — Inpatient Hospital Stay: Payer: Medicare Other

## 2022-09-09 ENCOUNTER — Other Ambulatory Visit: Payer: Self-pay

## 2022-09-09 ENCOUNTER — Inpatient Hospital Stay
Admission: EM | Admit: 2022-09-09 | Discharge: 2022-09-15 | DRG: 175 | Disposition: A | Discharge status: Home Health | Payer: Medicare Other | Attending: Student | Admitting: Student

## 2022-09-09 DIAGNOSIS — K219 Gastro-esophageal reflux disease without esophagitis: Secondary | ICD-10-CM | POA: Diagnosis present

## 2022-09-09 DIAGNOSIS — I700 Atherosclerosis of aorta: Secondary | ICD-10-CM | POA: Diagnosis present

## 2022-09-09 DIAGNOSIS — Z515 Encounter for palliative care: Secondary | ICD-10-CM

## 2022-09-09 DIAGNOSIS — N281 Cyst of kidney, acquired: Secondary | ICD-10-CM | POA: Diagnosis present

## 2022-09-09 DIAGNOSIS — J9601 Acute respiratory failure with hypoxia: Secondary | ICD-10-CM | POA: Diagnosis present

## 2022-09-09 DIAGNOSIS — I959 Hypotension, unspecified: Secondary | ICD-10-CM | POA: Diagnosis not present

## 2022-09-09 DIAGNOSIS — E876 Hypokalemia: Secondary | ICD-10-CM | POA: Diagnosis not present

## 2022-09-09 DIAGNOSIS — I4892 Unspecified atrial flutter: Secondary | ICD-10-CM | POA: Diagnosis present

## 2022-09-09 DIAGNOSIS — R188 Other ascites: Secondary | ICD-10-CM | POA: Diagnosis present

## 2022-09-09 DIAGNOSIS — Z79899 Other long term (current) drug therapy: Secondary | ICD-10-CM

## 2022-09-09 DIAGNOSIS — Z66 Do not resuscitate: Secondary | ICD-10-CM | POA: Diagnosis present

## 2022-09-09 DIAGNOSIS — R230 Cyanosis: Secondary | ICD-10-CM | POA: Diagnosis present

## 2022-09-09 DIAGNOSIS — I2489 Other forms of acute ischemic heart disease: Secondary | ICD-10-CM | POA: Diagnosis present

## 2022-09-09 DIAGNOSIS — E785 Hyperlipidemia, unspecified: Secondary | ICD-10-CM | POA: Diagnosis present

## 2022-09-09 DIAGNOSIS — H905 Unspecified sensorineural hearing loss: Secondary | ICD-10-CM | POA: Diagnosis present

## 2022-09-09 DIAGNOSIS — Z85528 Personal history of other malignant neoplasm of kidney: Secondary | ICD-10-CM

## 2022-09-09 DIAGNOSIS — I110 Hypertensive heart disease with heart failure: Secondary | ICD-10-CM | POA: Diagnosis present

## 2022-09-09 DIAGNOSIS — G2581 Restless legs syndrome: Secondary | ICD-10-CM | POA: Diagnosis present

## 2022-09-09 DIAGNOSIS — Z87442 Personal history of urinary calculi: Secondary | ICD-10-CM

## 2022-09-09 DIAGNOSIS — N179 Acute kidney failure, unspecified: Secondary | ICD-10-CM | POA: Diagnosis present

## 2022-09-09 DIAGNOSIS — R0602 Shortness of breath: Secondary | ICD-10-CM

## 2022-09-09 DIAGNOSIS — Z8249 Family history of ischemic heart disease and other diseases of the circulatory system: Secondary | ICD-10-CM

## 2022-09-09 DIAGNOSIS — Z87891 Personal history of nicotine dependence: Secondary | ICD-10-CM

## 2022-09-09 DIAGNOSIS — I4819 Other persistent atrial fibrillation: Secondary | ICD-10-CM | POA: Diagnosis present

## 2022-09-09 DIAGNOSIS — K7460 Unspecified cirrhosis of liver: Secondary | ICD-10-CM | POA: Diagnosis present

## 2022-09-09 DIAGNOSIS — I2699 Other pulmonary embolism without acute cor pulmonale: Secondary | ICD-10-CM | POA: Diagnosis present

## 2022-09-09 DIAGNOSIS — Z880 Allergy status to penicillin: Secondary | ICD-10-CM

## 2022-09-09 DIAGNOSIS — Z7189 Other specified counseling: Secondary | ICD-10-CM | POA: Diagnosis not present

## 2022-09-09 DIAGNOSIS — Z974 Presence of external hearing-aid: Secondary | ICD-10-CM

## 2022-09-09 DIAGNOSIS — R300 Dysuria: Secondary | ICD-10-CM | POA: Diagnosis present

## 2022-09-09 DIAGNOSIS — Z86711 Personal history of pulmonary embolism: Secondary | ICD-10-CM

## 2022-09-09 DIAGNOSIS — H5462 Unqualified visual loss, left eye, normal vision right eye: Secondary | ICD-10-CM | POA: Diagnosis present

## 2022-09-09 DIAGNOSIS — I2609 Other pulmonary embolism with acute cor pulmonale: Secondary | ICD-10-CM | POA: Diagnosis not present

## 2022-09-09 DIAGNOSIS — I081 Rheumatic disorders of both mitral and tricuspid valves: Secondary | ICD-10-CM | POA: Diagnosis present

## 2022-09-09 DIAGNOSIS — R609 Edema, unspecified: Principal | ICD-10-CM

## 2022-09-09 DIAGNOSIS — I5033 Acute on chronic diastolic (congestive) heart failure: Secondary | ICD-10-CM | POA: Diagnosis present

## 2022-09-09 DIAGNOSIS — I509 Heart failure, unspecified: Secondary | ICD-10-CM

## 2022-09-09 DIAGNOSIS — Z7901 Long term (current) use of anticoagulants: Secondary | ICD-10-CM

## 2022-09-09 LAB — BASIC METABOLIC PANEL
Anion gap: 8 (ref 5–15)
BUN: 52 mg/dL — ABNORMAL HIGH (ref 8–23)
CO2: 26 mmol/L (ref 22–32)
Calcium: 8.7 mg/dL — ABNORMAL LOW (ref 8.9–10.3)
Chloride: 102 mmol/L (ref 98–111)
Creatinine, Ser: 1.83 mg/dL — ABNORMAL HIGH (ref 0.61–1.24)
GFR, Estimated: 34 mL/min — ABNORMAL LOW (ref >60)
Glucose, Bld: 152 mg/dL — ABNORMAL HIGH (ref 70–99)
Potassium: 4.7 mmol/L (ref 3.5–5.1)
Sodium: 136 mmol/L (ref 135–145)

## 2022-09-09 LAB — CBC
HCT: 37.9 % — ABNORMAL LOW (ref 39.0–52.0)
Hemoglobin: 12.3 g/dL — ABNORMAL LOW (ref 13.0–17.0)
MCH: 34 pg (ref 26.0–34.0)
MCHC: 32.5 g/dL (ref 30.0–36.0)
MCV: 104.7 fL — ABNORMAL HIGH (ref 80.0–100.0)
Platelets: 134 10*3/uL — ABNORMAL LOW (ref 150–400)
RBC: 3.62 MIL/uL — ABNORMAL LOW (ref 4.22–5.81)
RDW: 18.2 % — ABNORMAL HIGH (ref 11.5–15.5)
WBC: 5.1 10*3/uL (ref 4.0–10.5)
nRBC: 0.4 % — ABNORMAL HIGH (ref 0.0–0.2)

## 2022-09-09 LAB — TROPONIN I (HIGH SENSITIVITY)
Troponin I (High Sensitivity): 49 ng/L — ABNORMAL HIGH (ref <18)
Troponin I (High Sensitivity): 48 ng/L — ABNORMAL HIGH (ref <18)

## 2022-09-09 LAB — BRAIN NATRIURETIC PEPTIDE: B Natriuretic Peptide: 1637.8 pg/mL — ABNORMAL HIGH (ref 0.0–100.0)

## 2022-09-09 LAB — APTT: aPTT: 38 seconds — ABNORMAL HIGH (ref 24–36)

## 2022-09-09 LAB — BLOOD GAS, ARTERIAL
Acid-base deficit: 0.2 mmol/L (ref 0.0–2.0)
Bicarbonate: 25.9 mmol/L (ref 20.0–28.0)
O2 Saturation: 33.6 %
Patient temperature: 37
pCO2 arterial: 47 mmHg (ref 32–48)
pH, Arterial: 7.35 (ref 7.35–7.45)
pO2, Arterial: <31 mmHg — CL (ref 83–108)

## 2022-09-09 LAB — IRON AND TIBC
Iron: 81 ug/dL (ref 45–182)
Saturation Ratios: 23 % (ref 17.9–39.5)
TIBC: 356 ug/dL (ref 250–450)
UIBC: 275 ug/dL

## 2022-09-09 LAB — HEPARIN LEVEL (UNFRACTIONATED): Heparin Unfractionated: >1.1 IU/mL — ABNORMAL HIGH (ref 0.30–0.70)

## 2022-09-09 LAB — FOLATE: Folate: 14.7 ng/mL (ref >5.9)

## 2022-09-09 LAB — D-DIMER, QUANTITATIVE: D-Dimer, Quant: 13.86 ug/mL-FEU — ABNORMAL HIGH (ref 0.00–0.50)

## 2022-09-09 MED ORDER — SODIUM CHLORIDE 0.9 % IV SOLN: 250.0000 mL | INTRAVENOUS | Status: DC | PRN

## 2022-09-09 MED ORDER — LORATADINE 10 MG PO TABS
10.0000 mg | ORAL_TABLET | Freq: Every day | ORAL | Status: DC
  Administered 2022-09-10 – 2022-09-15 (×6): 10 mg via ORAL
  Filled 2022-09-09 (×6): qty 1

## 2022-09-09 MED ORDER — ACETAMINOPHEN 325 MG PO TABS: 650.0000 mg | ORAL_TABLET | Freq: Four times a day (QID) | ORAL | Status: DC | PRN

## 2022-09-09 MED ORDER — SODIUM CHLORIDE 0.9% FLUSH: 3.0000 mL | INTRAVENOUS | Status: DC | PRN

## 2022-09-09 MED ORDER — SODIUM CHLORIDE 0.9% FLUSH
3.0000 mL | Freq: Two times a day (BID) | INTRAVENOUS | Status: DC
  Administered 2022-09-10 – 2022-09-15 (×11): 3 mL via INTRAVENOUS

## 2022-09-09 MED ORDER — APIXABAN 2.5 MG PO TABS
2.5000 mg | ORAL_TABLET | Freq: Two times a day (BID) | ORAL | Status: DC
  Filled 2022-09-09: qty 1

## 2022-09-09 MED ORDER — ACETAMINOPHEN 650 MG RE SUPP: 650.0000 mg | Freq: Four times a day (QID) | RECTAL | Status: DC | PRN

## 2022-09-09 MED ORDER — IOHEXOL 350 MG/ML SOLN
60.0000 mL | Freq: Once | INTRAVENOUS | Status: AC | PRN
  Administered 2022-09-09: 60 mL via INTRAVENOUS

## 2022-09-09 MED ORDER — FUROSEMIDE 10 MG/ML IJ SOLN
60.0000 mg | Freq: Once | INTRAMUSCULAR | Status: AC
Start: 2022-09-09 — End: 2022-09-09
  Administered 2022-09-09: 60 mg via INTRAVENOUS
  Filled 2022-09-09: qty 8

## 2022-09-09 MED ORDER — FUROSEMIDE 10 MG/ML IJ SOLN
4.0000 mg/h | INTRAVENOUS | Status: DC
  Administered 2022-09-09: 4 mg/h via INTRAVENOUS
  Filled 2022-09-09: qty 20

## 2022-09-09 MED ORDER — HEPARIN BOLUS VIA INFUSION
4000.0000 [IU] | Freq: Once | INTRAVENOUS | Status: AC
  Administered 2022-09-09: 4000 [IU] via INTRAVENOUS
  Filled 2022-09-09: qty 4000

## 2022-09-09 MED ORDER — HEPARIN (PORCINE) 25000 UT/250ML-% IV SOLN
1100.0000 [IU]/h | INTRAVENOUS | Status: DC
  Administered 2022-09-09: 1100 [IU]/h via INTRAVENOUS
  Filled 2022-09-09: qty 250

## 2022-09-09 MED ORDER — METOPROLOL SUCCINATE ER 25 MG PO TB24: 25.0000 mg | ORAL_TABLET | ORAL | Status: DC

## 2022-09-09 NOTE — ED Notes (Signed)
Report received from Kelly, RN

## 2022-09-09 NOTE — Consult Note (Signed)
ANTICOAGULATION CONSULT NOTE - Initial Consult  Pharmacy Consult for heparin infusion Indication: pulmonary embolus  Allergies  Allergen Reactions   Penicillins Anaphylaxis    Patient Measurements: Height: 5' 6"$  (167.6 cm) Weight: 69.4 kg (153 lb) IBW/kg (Calculated) : 63.8 Heparin Dosing Weight: 69.4 kg  Vital Signs: Temp: 97.9 F (36.6 C) (02/15 1806) Temp Source: Oral (02/15 1806) BP: 98/69 (02/15 2113) Pulse Rate: 65 (02/15 2113)  Labs: Recent Labs    09/09/22 1356 09/09/22 1557  HGB 12.3*  --   HCT 37.9*  --   PLT 134*  --   CREATININE 1.83*  --   TROPONINIHS 49* 48*    Estimated Creatinine Clearance: 21.8 mL/min (A) (by C-G formula based on SCr of 1.83 mg/dL (H)).   Medical History: Past Medical History:  Diagnosis Date   A-fib [facility])    a.) CHA2DS2-VASc = 4 (age x 2, CHF, aortic plaque). b.) s/p unsuccessful TEE cardioversion 04/28/2016 --> rec'd 200 J x1 and 360 J x 2. c.) rate/rhythm maintained on oral metoprolol succinante; chronically anticoagulated using dose reduced apixaban.   Anemia    Aortic atherosclerosis (HCC)    Arthritis    CHF (congestive heart failure) ([place])    a.) TTE 04/26/2016: EF >55%; mod LVH; MVP; mild-mod LAE, mild MR. b.) TTE 05/14/209: EF >55%; mod RVE; mild BAE; mod MR.TR; triv AR/PR. c.) TTE 03/28/2019: EF >55%; mild LVH; mod BAE; mild PR, mod MR/TR.   GERD (gastroesophageal reflux disease)    Heart murmur    History of kidney stones    HOH (hard of hearing)    a.) hearing aid to LEFT ear.   Hyperlipidemia    Infrarenal abdominal aortic aneurysm (AAA) without rupture ([place]) 05/05/2021   a.) CT 05/05/2021 --> measured 3.0 cm.   MVP (mitral valve prolapse)    Peripheral edema    Pneumonia    Pulmonary HTN ([place]) 04/26/2016   a.) TTE 04/26/2016: EF >55%; PASP 45. b.) TTE 12/06/2017: EF >55%; RVSP 76.6 mmHg. c.) TTE 03/28/2019: EF >55%; RVSP 77.8 mmHg.   Renal cell carcinoma (HCC)    Rheumatic fever    Sepsis ([place])    Valvular  heart disease     Medications:  PTA: apixaban 2.5 mg; last dose 2/15 AM   Assessment: 89 y.o. male  who presents to the emergency department today because of leg swelling and SOB. Patient with history of afib on apixaban. On CT found to have acute PE with evidence of right heart strain. Pharmacy has been consulted to initaite and manage IV heparin infusion.  Baseline: HL, aPTT ordered   Goal of Therapy:  Heparin level 0.3-0.7 units/ml aPTT 66-102 seconds Monitor platelets by anticoagulation protocol: Yes   Plan:  Give 4000 units bolus x 1 Start heparin infusion at 1100 units/hr Check aPTT level in 8 hours  Will monitor via aPTT due to recent apixaban until HL and aPTT correlate Continue to monitor H&H and platelets  Dorothe Pea, PharmD, BCPS Clinical Pharmacist   09/09/2022,9:16 PM

## 2022-09-09 NOTE — Progress Notes (Addendum)
       CROSS COVER NOTE  NAME: DOMINIE BENEDICK MRN: [id number] DOB : 1924-11-26    HPI/Events of Note   Patient admitted after several days shortness of breath. Found to have heart failure exacerbation and placed on lasix drip. Patient had decreased in oxygen saturation and requiring NRB to keep sats up 92 %.  Blood gas donne, severe hypoxia evident.     Assessment and  Interventions   Assessment: Patient alert, pale, SR rate 60's. BBS diminished.  Plan: Oxygen by heated high flo keep sats above 92 CTA chest rule out PE, effusion etc IMPRESSION: 1. Positive examination for acute pulmonary embolus involving bilateral lower lobe pulmonary arteries. Elevated RV to LV ratio of 1.8 may indicate right heart strain. 2. Cardiac enlargement with bilateral pleural effusions and probable interstitial edema. 3. Probable hepatic cirrhosis with upper abdominal ascites. 4. 6 mm nonobstructing stones seen in the right kidney. 5. Aortic atherosclerosis.   Change bed status step down  Heparin drip - pharmacy consult Vascular consulted, discussed with Dr Ronalee Belts over phone. Vascular will reevaluate in am need for thrombectomy      Kathlene Cote NP [facility]

## 2022-09-09 NOTE — ED Notes (Signed)
2nd trop sent.

## 2022-09-09 NOTE — Progress Notes (Signed)
ABG draw was attempted, unsuccessful, but Venous was obtained and resulted. Results seen by NP. Patient placed on heated high flow/opti flow, SpO2 96%, HR 66, RR 18.

## 2022-09-09 NOTE — ED Notes (Signed)
Provider Sharion Settler okay with pt titrating down on 02 since he is satting at 100%. RT called, they would like a VBG beforehand. Hassan Rowan informed of this, waiting for an order. Pt sleeping at this time.

## 2022-09-09 NOTE — H&P (Signed)
[facility] History and Physical   Patient: Marcus Kemp [id number]   PCP: Idelle Crouch, MD DOB: 12/19/1924   DOA: 09/09/2022   DOS: 09/09/2022   DOS: the patient was seen and examined on 09/09/2022   Patient coming from: The patient is coming from Home  Chief Complaint: SOB, lower ext edema   HPI: ROANAN BODLEY is a 89 y.o. 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  Patient has sensorineural deafness.  Denies any chest pain, no palpitations, no abdominal pain.  ED Course:  creatinine 1.83, AKI, BNP 1637 very elevated, troponin 49--48 most likely due to demand ischemia, hemoglobin 12.3, elevated MCV.  CXR consistent with pulmonary edema. Patient was given Lasix IV in the ED and [facility] hospitalist consulted for further management    Review of Systems: as mentioned in the history of present illness.  All other systems reviewed and are negative.  Past Medical History:  Diagnosis Date   A-fib [facility])    a.) CHA2DS2-VASc = 4 (age x 2, CHF, aortic plaque). b.) s/p unsuccessful TEE cardioversion 04/28/2016 --> rec'd 200 J x1 and 360 J x 2. c.) rate/rhythm maintained on oral metoprolol succinante; chronically anticoagulated using dose reduced apixaban.   Anemia    Aortic atherosclerosis (HCC)    Arthritis    CHF (congestive heart failure) ([place])    a.) TTE 04/26/2016: EF >55%; mod LVH; MVP; mild-mod LAE, mild MR. b.) TTE 05/14/209: EF >55%; mod RVE; mild BAE; mod MR.TR; triv AR/PR. c.) TTE 03/28/2019: EF >55%; mild LVH; mod BAE; mild PR, mod MR/TR.   GERD (gastroesophageal reflux disease)    Heart murmur    History of kidney stones    HOH (hard of hearing)    a.) hearing aid to LEFT ear.   Hyperlipidemia    Infrarenal  abdominal aortic aneurysm (AAA) without rupture ([place]) 05/05/2021   a.) CT 05/05/2021 --> measured 3.0 cm.   MVP (mitral valve prolapse)    Peripheral edema    Pneumonia    Pulmonary HTN ([place]) 04/26/2016   a.) TTE 04/26/2016: EF >55%; PASP 45. b.) TTE 12/06/2017: EF >55%; RVSP 76.6 mmHg. c.) TTE 03/28/2019: EF >55%; RVSP 77.8 mmHg.   Renal cell carcinoma (HCC)    Rheumatic fever    Sepsis ([place])    Valvular heart disease    Past Surgical History:  Procedure Laterality Date   INGUINAL HERNIA REPAIR Left 01/13/2022   Procedure: HERNIA REPAIR INGUINAL ADULT;  Surgeon: Robert Bellow, MD;  Location: [facility] ORS;  Service: General;  Laterality: Left;   INSERTION OF MESH  01/13/2022   Procedure: INSERTION OF MESH;  Surgeon: Robert Bellow, MD;  Location: [facility] ORS;  Service: General;;   LITHOTRIPSY     TEE WITH CARDIOVERSION N/A    P   TONSILLECTOMY     Social History:  reports that he quit smoking about 84 years ago. His smoking use included cigarettes. He has a 2.00 pack-year smoking history. He has never used smokeless tobacco. He reports that he does not drink alcohol and does not use drugs.  Allergies  Allergen Reactions   Penicillins Anaphylaxis     Family history reviewed and not pertinent Family History  Problem Relation Age of Onset   Heart disease  Sister    Hypertension Other      Prior to Admission medications   Medication Sig Start Date End Date Taking? Authorizing Provider  acetaminophen (TYLENOL) 500 MG tablet Take 1,000 mg by mouth at bedtime.    [provider]  apixaban (ELIQUIS) 2.5 MG TABS tablet Take 1 tablet (2.5 mg total) by mouth 2 (two) times daily. 01/16/22   Robert Bellow, MD  Azelastine HCl 137 MCG/SPRAY SOLN SMARTSIG:2 Puff(s) Both Nares Twice Daily 12/08/21   [provider]  cetirizine (ZYRTEC) 10 MG tablet SMARTSIG:1 pill By Mouth Daily PRN 12/13/21   [provider]  furosemide (LASIX) 20 MG tablet Take 2 tablets (40  mg total) by mouth every morning. 01/28/22 02/27/22  Barb Merino, MD  metoprolol succinate (TOPROL-XL) 25 MG 24 hr tablet Take 25 mg by mouth every morning. 05/03/21   [provider]  OVER THE COUNTER MEDICATION Take 2 tablets by mouth at bedtime. Seratame    [provider]  OVER THE COUNTER MEDICATION Take 1 tablet by mouth at bedtime. Relaxium    [provider]  vitamin B-12 (CYANOCOBALAMIN) 500 MCG tablet Take 1 tablet (500 mcg total) by mouth daily. 12/29/21   Earlie Server, MD    Physical Exam: Vitals:   09/09/22 1615 09/09/22 1620 09/09/22 1723 09/09/22 1806  BP: 114/72  131/74   Pulse:  (!) 58 61   Resp:  18 20   Temp:    97.9 F (36.6 C)  TempSrc:    Oral  SpO2:  100% 96%   Weight:      Height:        General: alert and midl Resp Disdtress distress, affect appropriate Eyes: PERRLA, Conjunctiva normal ENT: Oral Mucosa Clear, dry  Neck: no JVD, no Abnormal Mass Or lumps Cardiovascular: S1 and S2 Present, no Murmur,  Respiratory: good respiratory effort, Bilateral Air entry equal,  no signs of accessory muscle use, mild bibasilar crackles, no wheezes Abdomen: Bowel Sound present, Soft and no tenderness, no hernia Skin: no rashes  Extremities: 4+ Pedal edema, no calf tenderness Neurologic: without any new focal findings Gait not checked due to patient safety concerns  Data Reviewed: I have personally reviewed and interpreted labs, imaging as discussed below.  CBC: Recent Labs  Lab 09/09/22 1356  WBC 5.1  HGB 12.3*  HCT 37.9*  MCV 104.7*  PLT [id number]*   Basic Metabolic Panel: Recent Labs  Lab 09/09/22 1356  NA 136  K 4.7  CL 102  CO2 26  GLUCOSE 152*  BUN 52*  CREATININE 1.83*  CALCIUM 8.7*   GFR: Estimated Creatinine Clearance: 21.8 mL/min (A) (by C-G formula based on SCr of 1.83 mg/dL (H)). Liver Function Tests: No results for input(s): "AST", "ALT", "ALKPHOS", "BILITOT", "PROT", "ALBUMIN" in the last 168 hours. No results for  input(s): "LIPASE", "AMYLASE" in the last 168 hours. No results for input(s): "AMMONIA" in the last 168 hours. Coagulation Profile: No results for input(s): "INR", "PROTIME" in the last 168 hours. Cardiac Enzymes: No results for input(s): "CKTOTAL", "CKMB", "CKMBINDEX", "TROPONINI" in the last 168 hours. BNP (last 3 results) No results for input(s): "PROBNP" in the last 8760 hours. HbA1C: No results for input(s): "HGBA1C" in the last 72 hours. CBG: No results for input(s): "GLUCAP" in the last 168 hours. Lipid Profile: No results for input(s): "CHOL", "HDL", "LDLCALC", "TRIG", "CHOLHDL", "LDLDIRECT" in the last 72 hours. Thyroid Function Tests: No results for input(s): "TSH", "T4TOTAL", "FREET4", "T3FREE", "THYROIDAB" in the last  72 hours. Anemia Panel: Recent Labs    09/09/22 1554  FOLATE 14.7  TIBC 356  IRON 81   Urine analysis: No results found for: "COLORURINE", "APPEARANCEUR", "LABSPEC", "PHURINE", "GLUCOSEU", "HGBUR", "BILIRUBINUR", "KETONESUR", "PROTEINUR", "UROBILINOGEN", "NITRITE", "LEUKOCYTESUR"  Radiological Exams on Admission: US RENAL  Result Date: 09/09/2022 CLINICAL DATA:  Acute kidney injury EXAM: RENAL / URINARY TRACT ULTRASOUND COMPLETE COMPARISON:  CT 12/24/2021 FINDINGS: Right Kidney: Renal measurements: 9.9 x 5.3 x 5.2 = volume: 142 mL. Echogenicity within normal limits. No mass or hydronephrosis visualized. Central presumed parapelvic renal cyst as seen on prior CT scan measuring 19 mm. No collecting system dilatation or perinephric fluid. Left Kidney: Renal measurements: 9.9 x 4.4 x 4.5 = volume: 103 mL. There are some near anechoic foci in the left kidney with through transmission measuring 2.2 x 1.9 x 1.8 cm and 2.1 by 2.2 x 1.8 cm. On prior CT scan these are also felt to be cysts. There is however a lesion in the posterior aspect of the left kidney on the prior measuring 19 mm which was a solid lesion. This is not clearly seen on today's examination. Bladder:  Appears normal for degree of bladder distention. Other: Scattered ascites IMPRESSION: No collecting system dilatation.  Bilateral renal cysts. Ascites. Prior CT scan described enhancing mass in the left kidney. This is not clearly seen on this examination today. On the prior study this is felt to be an aggressive lesion or potentially neoplasm. Please correlate with known history and workup Electronically Signed   By: Jill Side M.D.   On: 09/09/2022 19:32   DG Chest 2 View  Result Date: 09/09/2022 CLINICAL DATA:  Chest pain. EXAM: CHEST - 2 VIEW COMPARISON:  January 27, 2022. FINDINGS: Stable cardiomegaly. Mild bibasilar subsegmental atelectasis or possibly edema is noted. Bony thorax is unremarkable. IMPRESSION: Mild bibasilar subsegmental atelectasis or possibly edema is noted. Aortic Atherosclerosis (ICD10-I70.0). Electronically Signed   By: Marijo Conception M.D.   On: 09/09/2022 14:18    I reviewed all nursing notes, pharmacy notes, vitals, pertinent old records.  Assessment/Plan Principal Problem:   CHF (congestive heart failure) (HCC)   Acute hypoxic respiratory failure secondary to pulmonary edema, and CHF exacerbation Continue supplemental O2 admission gradually wean off Patient was cyanotic on exam, patient was placed on nonrebreather Stat ABG was ordered Critical care consulted D-dimer pending Patient may need VQ scan to rule out PE if D-dimer elevated ABG consistent with hypoxia   Acute on chronic diastolic CHF exacerbation Presented with pulmonary edema and bilateral lower extremity edema S/p Lasix IV 60 mg x 1 dose given in the ED Started Lasix IV infusion for slow diuresis due to renal failure Troponin slightly elevated most likely demand ischemia, patient denies any chest pain Follow 2D echocardiogram     AKI, possible cardiorenal syndrome Monitor urine output and renal functions daily Insert Foley catheter for close urine output monitoring Avoid nephrotoxic  medications, use renally dose medications US renal: No collecting system dilatation.  Bilateral renal cysts. Ascites.    Hypertension, A- flutter s/p ablation Held Lopressor due to low blood pressure Resumed Eliquis 2.5 mg p.o. twice daily home dose Monitor BP and titrate medications accordingly Continue to monitor on telemetry  Overall poor prognosis due to advanced age, palliative care consulted for goals of care discussion with family.  Currently patient is full code.   Nutrition: Cardiac diet DVT Prophylaxis: Therapeutic Anticoagulation with Eliquis  Advance goals of care discussion: Full code   Consults: Consulted  critical care for possibly due to radiation and intubation. Secure chat with on-call NP   Family Communication: family was present at bedside, at the time of interview.  Opportunity was given to ask question and all questions were answered satisfactorily.  Disposition: Admitted as inpatient, telemetry unit. Likely to be discharged SNF vs HHPT, in 2-3 days .  I have discussed plan of care as described above with RN and patient/family.  Severity of Illness: The appropriate patient status for this patient is INPATIENT. Inpatient status is judged to be reasonable and necessary in order to provide the required intensity of service to ensure the patient's safety. The patient's presenting symptoms, physical exam findings, and initial radiographic and laboratory data in the context of their chronic comorbidities is felt to place them at high risk for further clinical deterioration. Furthermore, it is not anticipated that the patient will be medically stable for discharge from the hospital within 2 midnights of admission.   * I certify that at the point of admission it is my clinical judgment that the patient will require inpatient hospital care spanning beyond 2 midnights from the point of admission due to high intensity of service, high risk for further deterioration and high  frequency of surveillance required.*   Author: Val Riles, MD [facility] 09/09/2022 7:55 PM   To reach On-call, see care teams to locate the attending and reach out to them via [email or web address]. If 7PM-7AM, please contact night-coverage If you still have difficulty reaching the attending provider, please page the [facility] (Director on Call) for [facility] on amion for assistance.

## 2022-09-09 NOTE — ED Notes (Signed)
Repeat VS obtained; trop collected and sent to lab at this time.

## 2022-09-09 NOTE — ED Triage Notes (Signed)
Pt here with bilateral leg swelling and fluid buildup. Pt states he does not have nay energy and has some SOB. Pt is hard of hearing.

## 2022-09-09 NOTE — ED Notes (Signed)
Writer called RT to evaluate the pt

## 2022-09-09 NOTE — ED Notes (Signed)
Unable to get a good pleth wave on 6L [place], pt placed on a NRB mask, pleth wave is at 85-90% on 15L NRB. Dr. Dwyane Dee notified, waiting for a response right now

## 2022-09-09 NOTE — ED Notes (Signed)
Pt bladder scanned for 119m. He had a total output of 2021mof urine post 6034mf lasix. Dr. KumDwyane Deeare. RT will draw a ABG. Pt satting at 92% on15L NRB mask with a good pleth wave. Pts respirations are 20/ min and are non labored. His lips and earlobes are blue colored. MD aware of this too and he already came to the bedside to see the pt.

## 2022-09-09 NOTE — ED Provider Notes (Signed)
[facility] Provider Note    Event Date/Time   First MD Initiated Contact with Patient 09/09/22 1619     (approximate)   History   Leg Swelling   HPI  Marcus Kemp is a 89 y.o. male  who presents to the emergency department today because of leg swelling. Patient has history of heart failure and is on lasix daily. However for the past week and a half he has noticed increased swelling to bilateral legs. This has been accompanied by some shortness of breath and weakness.  The patient denies any fevers.  No chills.  Per chart review patient had admission last summer for fluid overload.      Physical Exam   Triage Vital Signs: ED Triage Vitals  Enc Vitals Group     BP 09/09/22 1353 (!) 101/54     Pulse Rate 09/09/22 1353 (!) 117     Resp 09/09/22 1353 18     Temp 09/09/22 1353 (!) 97.5 F (36.4 C)     Temp Source 09/09/22 1353 Oral     SpO2 09/09/22 1355 97 %     Weight 09/09/22 1352 153 lb (69.4 kg)     Height 09/09/22 1352 5' 6"$  (1.676 m)     Head Circumference --      Peak Flow --      Pain Score 09/09/22 1352 0     Pain Loc --      Pain Edu? --      Excl. in [place]? --     Most recent vital signs: Vitals:   09/09/22 1615 09/09/22 1620  BP: 114/72   Pulse:  (!) 58  Resp:  18  Temp:    SpO2:  100%   General: Awake, alert, oriented. CV:  Good peripheral perfusion. Regular rate and rhythm. Resp:  Normal effort.  Abd:  No distention.  Other:  Bilateral lower extremity edema.    ED Results / Procedures / Treatments   Labs (all labs ordered are listed, but only abnormal results are displayed) Labs Reviewed  BASIC METABOLIC PANEL - Abnormal; Notable for the following components:      Result Value   Glucose, Bld 152 (*)    BUN 52 (*)    Creatinine, Ser 1.83 (*)    Calcium 8.7 (*)    GFR, Estimated 34 (*)    All other components within normal limits  CBC - Abnormal; Notable for the following components:   RBC 3.62 (*)     Hemoglobin 12.3 (*)    HCT 37.9 (*)    MCV 104.7 (*)    RDW 18.2 (*)    Platelets 134 (*)    nRBC 0.4 (*)    All other components within normal limits  BRAIN NATRIURETIC PEPTIDE - Abnormal; Notable for the following components:   B Natriuretic Peptide 1,637.8 (*)    All other components within normal limits  TROPONIN I (HIGH SENSITIVITY) - Abnormal; Notable for the following components:   Troponin I (High Sensitivity) 49 (*)    All other components within normal limits  TROPONIN I (HIGH SENSITIVITY)     EKG  I, Nance Pear, attending physician, personally viewed and interpreted this EKG  EKG Time: 1358 Rate: 66 Rhythm: normal sinus rhythm Axis: normal Intervals: qtc 486 QRS: narrow ST changes: no st elevation, t wave inversion II, III, V3 Impression: abnormal ekg    RADIOLOGY I independently interpreted and visualized the CXR. My interpretation: Bilateral edema Radiology  interpretation:  IMPRESSION:  Mild bibasilar subsegmental atelectasis or possibly edema is noted.      PROCEDURES:  Critical Care performed: No  Procedures   MEDICATIONS ORDERED IN ED: Medications - No data to display   IMPRESSION / MDM / [place] / ED COURSE  I reviewed the triage vital signs and the nursing notes.                              Differential diagnosis includes, but is not limited to, cellulitis, DVT, heart failure, kidney failure  Patient's presentation is most consistent with acute presentation with potential threat to life or bodily function.  Patient presented to the emergency department today with bilateral lower extremity swelling as well as shortness of breath.  Patient does have a lower extremity swelling on exam.  Blood work is concerning for elevated BNP.  Chest x-ray is consistent with pulmonary edema.  Some I do think patient is suffering from fluid overload.  Will give dose of IV Lasix here in the emergency department. Discussed with Dr. Dwyane Dee  with the hospitalist service who will plan on admission.     FINAL CLINICAL IMPRESSION(S) / ED DIAGNOSES   Final diagnoses:  Peripheral edema  Shortness of breath     Note:  This document was prepared using Dragon voice recognition software and may include unintentional dictation errors.    Nance Pear, MD 09/09/22 [phone]

## 2022-09-10 ENCOUNTER — Inpatient Hospital Stay (HOSPITAL_COMMUNITY)
Admit: 2022-09-10 | Discharge: 2022-09-10 | Disposition: A | Discharge status: Home / Self Care | Payer: Medicare Other | Attending: Student | Admitting: Student

## 2022-09-10 ENCOUNTER — Inpatient Hospital Stay: Payer: Medicare Other

## 2022-09-10 ENCOUNTER — Encounter: Payer: Self-pay | Admitting: Student

## 2022-09-10 DIAGNOSIS — I5033 Acute on chronic diastolic (congestive) heart failure: Secondary | ICD-10-CM

## 2022-09-10 DIAGNOSIS — I2699 Other pulmonary embolism without acute cor pulmonale: Secondary | ICD-10-CM | POA: Diagnosis present

## 2022-09-10 DIAGNOSIS — I2609 Other pulmonary embolism with acute cor pulmonale: Secondary | ICD-10-CM | POA: Diagnosis not present

## 2022-09-10 LAB — APTT
aPTT: >200 seconds (ref 24–36)
aPTT: 173 seconds (ref 24–36)

## 2022-09-10 LAB — BASIC METABOLIC PANEL
Anion gap: 9 (ref 5–15)
BUN: 48 mg/dL — ABNORMAL HIGH (ref 8–23)
CO2: 26 mmol/L (ref 22–32)
Calcium: 8.3 mg/dL — ABNORMAL LOW (ref 8.9–10.3)
Chloride: 98 mmol/L (ref 98–111)
Creatinine, Ser: 1.56 mg/dL — ABNORMAL HIGH (ref 0.61–1.24)
GFR, Estimated: 41 mL/min — ABNORMAL LOW (ref >60)
Glucose, Bld: 91 mg/dL (ref 70–99)
Potassium: 4.3 mmol/L (ref 3.5–5.1)
Sodium: 133 mmol/L — ABNORMAL LOW (ref 135–145)

## 2022-09-10 LAB — ECHOCARDIOGRAM COMPLETE
AR max vel: 2.3 cm2
AV Area VTI: 2.23 cm2
AV Area mean vel: 2.18 cm2
AV Mean grad: 4 mmHg
AV Peak grad: 7.3 mmHg
Ao pk vel: 1.35 m/s
Area-P 1/2: 4.29 cm2
Height: 66 in
MV M vel: 4.26 m/s
MV Peak grad: 72.6 mmHg
MV VTI: 0.66 cm2
Radius: 0.8 cm
S' Lateral: 2.5 cm
Weight: 2447.99 oz

## 2022-09-10 LAB — CBC
HCT: 37.1 % — ABNORMAL LOW (ref 39.0–52.0)
Hemoglobin: 11.9 g/dL — ABNORMAL LOW (ref 13.0–17.0)
MCH: 33.8 pg (ref 26.0–34.0)
MCHC: 32.1 g/dL (ref 30.0–36.0)
MCV: 105.4 fL — ABNORMAL HIGH (ref 80.0–100.0)
Platelets: 125 10*3/uL — ABNORMAL LOW (ref 150–400)
RBC: 3.52 MIL/uL — ABNORMAL LOW (ref 4.22–5.81)
RDW: 18.2 % — ABNORMAL HIGH (ref 11.5–15.5)
WBC: 5.6 10*3/uL (ref 4.0–10.5)
nRBC: 0.4 % — ABNORMAL HIGH (ref 0.0–0.2)

## 2022-09-10 LAB — VITAMIN B12: Vitamin B-12: 1942 pg/mL — ABNORMAL HIGH (ref 180–914)

## 2022-09-10 LAB — PHOSPHORUS: Phosphorus: 4.6 mg/dL (ref 2.5–4.6)

## 2022-09-10 LAB — MAGNESIUM: Magnesium: 2.2 mg/dL (ref 1.7–2.4)

## 2022-09-10 MED ORDER — HEPARIN (PORCINE) 25000 UT/250ML-% IV SOLN
950.0000 [IU]/h | INTRAVENOUS | Status: DC
  Administered 2022-09-10: 950 [IU]/h via INTRAVENOUS

## 2022-09-10 MED ORDER — HEPARIN (PORCINE) 25000 UT/250ML-% IV SOLN
800.0000 [IU]/h | INTRAVENOUS | Status: DC
  Administered 2022-09-10: 800 [IU]/h via INTRAVENOUS
  Filled 2022-09-10: qty 250

## 2022-09-10 NOTE — Consult Note (Signed)
ANTICOAGULATION CONSULT NOTE - Initial Consult  Pharmacy Consult for heparin infusion Indication: pulmonary embolus  Allergies  Allergen Reactions   Penicillins Anaphylaxis    Patient Measurements: Height: 5' 6"$  (167.6 cm) Weight: 69.4 kg (153 lb) IBW/kg (Calculated) : 63.8 Heparin Dosing Weight: 69.4 kg  Vital Signs: Temp: 98.1 F (36.7 C) (02/16 1515) Temp Source: Oral (02/16 1515) BP: 109/54 (02/16 1730) Pulse Rate: 58 (02/16 1730)  Labs: Recent Labs    09/09/22 1356 09/09/22 1557 09/09/22 2034 09/10/22 0441 09/10/22 0651 09/10/22 1717  HGB 12.3*  --   --  11.9*  --   --   HCT 37.9*  --   --  37.1*  --   --   PLT 134*  --   --  125*  --   --   APTT  --   --  38*  --  >200* 173*  HEPARINUNFRC  --   --  >1.10*  --   --   --   CREATININE 1.83*  --   --  1.56*  --   --   TROPONINIHS 49* 48*  --   --   --   --      Estimated Creatinine Clearance: 25.6 mL/min (A) (by C-G formula based on SCr of 1.56 mg/dL (H)).   Medical History: Past Medical History:  Diagnosis Date   A-fib [facility])    a.) CHA2DS2-VASc = 4 (age x 2, CHF, aortic plaque). b.) s/p unsuccessful TEE cardioversion 04/28/2016 --> rec'd 200 J x1 and 360 J x 2. c.) rate/rhythm maintained on oral metoprolol succinante; chronically anticoagulated using dose reduced apixaban.   Anemia    Aortic atherosclerosis (HCC)    Arthritis    CHF (congestive heart failure) ([place])    a.) TTE 04/26/2016: EF >55%; mod LVH; MVP; mild-mod LAE, mild MR. b.) TTE 05/14/209: EF >55%; mod RVE; mild BAE; mod MR.TR; triv AR/PR. c.) TTE 03/28/2019: EF >55%; mild LVH; mod BAE; mild PR, mod MR/TR.   GERD (gastroesophageal reflux disease)    Heart murmur    History of kidney stones    HOH (hard of hearing)    a.) hearing aid to LEFT ear.   Hyperlipidemia    Infrarenal abdominal aortic aneurysm (AAA) without rupture ([place]) 05/05/2021   a.) CT 05/05/2021 --> measured 3.0 cm.   MVP (mitral valve prolapse)    Peripheral edema     Pneumonia    Pulmonary HTN ([place]) 04/26/2016   a.) TTE 04/26/2016: EF >55%; PASP 45. b.) TTE 12/06/2017: EF >55%; RVSP 76.6 mmHg. c.) TTE 03/28/2019: EF >55%; RVSP 77.8 mmHg.   Renal cell carcinoma (HCC)    Rheumatic fever    Sepsis ([place])    Valvular heart disease     Medications:  PTA: apixaban 2.5 mg; last dose 2/15 AM   Assessment: 89 y.o. male  who presents to the emergency department today because of leg swelling and SOB. Patient with history of afib on apixaban. On CT found to have acute PE with evidence of right heart strain. Pharmacy has been consulted to initaite and manage IV heparin infusion.  Baseline: HL > 1.10, aPTT 38  2/16 0651 aPTT > 200 2/16 1717 aPTT 173  Goal of Therapy:  Heparin level 0.3-0.7 units/ml aPTT 66-102 seconds Monitor platelets by anticoagulation protocol: Yes   Plan:  aPTT supratherapeutic Hold heparin infusion x 1 hour Resume heparin infusion at reduced rate of 800 units/hr Check aPTT level 8 hours after rate change Will monitor  via aPTT due to recent apixaban until HL and aPTT correlate Continue to monitor H&H and platelets  Lorin Picket, PharmD Clinical Pharmacist   09/10/2022,6:34 PM

## 2022-09-10 NOTE — Progress Notes (Signed)
*  PRELIMINARY RESULTS* Echocardiogram 2D Echocardiogram has been performed.  Marcus Kemp 09/10/2022, 4:13 PM

## 2022-09-10 NOTE — Consult Note (Signed)
   Heart Failure Nurse Navigator Note  HFrEF 55-60%.  Mild LVH.  Mild bi atrial enlargement.  Moderate mitral regurgitation.  Mild mitral stenosis.  He presented to the emergency room with complaints of weakness, lack of energy, bilateral lower extremity edema and shortness of breath for 1 week.  BNP 6 1637.  Chest x-ray showed revealed pulmonary edema.  CT revealed acute pulmonary embolus with right heart strain.  Comorbidities:   Atrial fibrillation on NOAC Anemia GERD Hyperlipidemia Pulmonary hypertension History of rheumatic fever Arthritis  Medications:  Lasix infusion at 4 mg an hour Heparin infusion   Labs:  Sodium 133, potassium 4.3, chloride 98, CO2 26, BUN 48, creatinine 1.56, estimated GFR 41, D-dimer 13.68 Phosphorus 4.6, hemoglobin 12.3, magnesium 2.2. Weight 69.4 kg BMI 24.69  Initial meeting with patient and wife in the ED.  Patient was sitting in a high Fowlers position currently on high flow nasal cannula.  And appeared to be sleeping.  Spoke with the wife.  She states at one time when patient had been bothered by lower extremity edema he had been weighing himself daily when that resolved he quit weighing daily.  Explained the reasoning behind weighing him daily and reporting 2 pound weight gain overnight or total of 5 pounds within the week.  She voices understanding.   Also discussed fluid restriction of 64 ounces.  Wife states that he drinks a lot of water throughout the day but they had not been measuring.  She states that he also sucks on ice through out the day.  Discussed measuring that 1 cup of ice equals 1/2 cup a liquid along with measuring his other fluids so he does not get over the 64 ounces.  Went over low-sodium diet.  She states that they are not ones to eat restaurant food.  She states that she does cook with salt.  Explained reasoning behind removing the salt from the foods and eating lower sodium and being more cognizant of reading  labels.  No further questions, she was given the living with heart failure teaching booklet, zone magnet, info on low-sodium and heart failure along with weight chart.  Follow-up in the outpatient heart failure clinic 26 February at 11 AM.  He has a 0% no-show ratio which is 0 out of 19 appointments.  Will continue to follow.  Pricilla Riffle RN CHFN

## 2022-09-10 NOTE — Consult Note (Signed)
ANTICOAGULATION CONSULT NOTE - Initial Consult  Pharmacy Consult for heparin infusion Indication: pulmonary embolus  Allergies  Allergen Reactions   Penicillins Anaphylaxis    Patient Measurements: Height: 5' 6"$  (167.6 cm) Weight: 69.4 kg (153 lb) IBW/kg (Calculated) : 63.8 Heparin Dosing Weight: 69.4 kg  Vital Signs: Temp: 98 F (36.7 C) (02/16 0335) Temp Source: Oral (02/15 2334) BP: 122/72 (02/16 0700) Pulse Rate: 61 (02/16 0700)  Labs: Recent Labs    09/09/22 1356 09/09/22 1557 09/09/22 2034 09/10/22 0441 09/10/22 0651  HGB 12.3*  --   --  11.9*  --   HCT 37.9*  --   --  37.1*  --   PLT 134*  --   --  125*  --   APTT  --   --  38*  --  >200*  HEPARINUNFRC  --   --  >1.10*  --   --   CREATININE 1.83*  --   --  1.56*  --   TROPONINIHS 49* 48*  --   --   --      Estimated Creatinine Clearance: 25.6 mL/min (A) (by C-G formula based on SCr of 1.56 mg/dL (H)).   Medical History: Past Medical History:  Diagnosis Date   A-fib [facility])    a.) CHA2DS2-VASc = 4 (age x 2, CHF, aortic plaque). b.) s/p unsuccessful TEE cardioversion 04/28/2016 --> rec'd 200 J x1 and 360 J x 2. c.) rate/rhythm maintained on oral metoprolol succinante; chronically anticoagulated using dose reduced apixaban.   Anemia    Aortic atherosclerosis (HCC)    Arthritis    CHF (congestive heart failure) ([place])    a.) TTE 04/26/2016: EF >55%; mod LVH; MVP; mild-mod LAE, mild MR. b.) TTE 05/14/209: EF >55%; mod RVE; mild BAE; mod MR.TR; triv AR/PR. c.) TTE 03/28/2019: EF >55%; mild LVH; mod BAE; mild PR, mod MR/TR.   GERD (gastroesophageal reflux disease)    Heart murmur    History of kidney stones    HOH (hard of hearing)    a.) hearing aid to LEFT ear.   Hyperlipidemia    Infrarenal abdominal aortic aneurysm (AAA) without rupture ([place]) 05/05/2021   a.) CT 05/05/2021 --> measured 3.0 cm.   MVP (mitral valve prolapse)    Peripheral edema    Pneumonia    Pulmonary HTN ([place]) 04/26/2016   a.) TTE  04/26/2016: EF >55%; PASP 45. b.) TTE 12/06/2017: EF >55%; RVSP 76.6 mmHg. c.) TTE 03/28/2019: EF >55%; RVSP 77.8 mmHg.   Renal cell carcinoma (HCC)    Rheumatic fever    Sepsis ([place])    Valvular heart disease     Medications:  PTA: apixaban 2.5 mg; last dose 2/15 AM   Assessment: 89 y.o. male  who presents to the emergency department today because of leg swelling and SOB. Patient with history of afib on apixaban. On CT found to have acute PE with evidence of right heart strain. Pharmacy has been consulted to initaite and manage IV heparin infusion.  Baseline: HL > 1.10, aPTT 38  2/16 0651 aPTT > 200   Goal of Therapy:  Heparin level 0.3-0.7 units/ml aPTT 66-102 seconds Monitor platelets by anticoagulation protocol: Yes   Plan:  Heparin Supratherapeutic Hold heparin infusion x 1 hour Resume heparin infusion at reduced rate of 950 units/hr Check aPTT level following rate change Will monitor via aPTT due to recent apixaban until HL and aPTT correlate Continue to monitor H&H and platelets  Dorothe Pea, PharmD, BCPS Clinical Pharmacist  09/10/2022,7:58 AM

## 2022-09-10 NOTE — ED Notes (Signed)
Pt A&Ox4. Pt speech clear. Pt repositioned in bed. Wife at bedside.

## 2022-09-10 NOTE — ED Notes (Signed)
PTT redrawn and sent to lab

## 2022-09-10 NOTE — Consult Note (Signed)
Hospital Consult    Reason for Consult:  Pulmonary Embolism Requesting Physician:  Dr. Val Riles MD MRN #:  [id number]  History of Present Illness: This is a 89 y.o. 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  Patient has sensorineural deafness.  Denies any chest pain, no palpitations, no abdominal pain.   He presents to [facility] emergency department with SOB and bilateral lower extremity edema. Over the past week and a half he has noticed increased swelling to bilateral legs. This has been accompanied by some shortness of breath and weakness.  The patient denies any fevers.  No chills.  Per chart review patient had admission last summer for fluid overload.   On exam today patient is resting comfortably in the emergency department.  He is currently wearing high flow oxygen via nasal cannula with oxygen saturation at 94 to 96%.  Patient is hard of hearing and uses hearing aids to the left ear to hear.  Patient is on a heparin infusion.  Patient states that he feels like he is breathing better now.  Patient's bilateral lower extremities remain swollen but the patient states that that is even better at this time.  Past Medical History:  Diagnosis Date   A-fib [facility])    a.) CHA2DS2-VASc = 4 (age x 2, CHF, aortic plaque). b.) s/p unsuccessful TEE cardioversion 04/28/2016 --> rec'd 200 J x1 and 360 J x 2. c.) rate/rhythm maintained on oral metoprolol succinante; chronically anticoagulated using dose reduced apixaban.   Anemia    Aortic atherosclerosis (HCC)    Arthritis    CHF (congestive heart failure) ([place])    a.) TTE 04/26/2016: EF >55%; mod LVH; MVP; mild-mod LAE, mild MR. b.) TTE 05/14/209: EF >55%; mod RVE; mild BAE; mod MR.TR; triv AR/PR. c.) TTE  03/28/2019: EF >55%; mild LVH; mod BAE; mild PR, mod MR/TR.   GERD (gastroesophageal reflux disease)    Heart murmur    History of kidney stones    HOH (hard of hearing)    a.) hearing aid to LEFT ear.   Hyperlipidemia    Infrarenal abdominal aortic aneurysm (AAA) without rupture ([place]) 05/05/2021   a.) CT 05/05/2021 --> measured 3.0 cm.   MVP (mitral valve prolapse)    Peripheral edema    Pneumonia    Pulmonary HTN ([place]) 04/26/2016   a.) TTE 04/26/2016: EF >55%; PASP 45. b.) TTE 12/06/2017: EF >55%; RVSP 76.6 mmHg. c.) TTE 03/28/2019: EF >55%; RVSP 77.8 mmHg.   Renal cell carcinoma (HCC)    Rheumatic fever    Sepsis ([place])    Valvular heart disease     Past Surgical History:  Procedure Laterality Date   INGUINAL HERNIA REPAIR Left 01/13/2022   Procedure: HERNIA REPAIR INGUINAL ADULT;  Surgeon: Robert Bellow, MD;  Location: [facility] ORS;  Service: General;  Laterality: Left;   INSERTION OF MESH  01/13/2022   Procedure: INSERTION OF MESH;  Surgeon: Robert Bellow, MD;  Location: [facility] ORS;  Service: General;;   LITHOTRIPSY     TEE WITH CARDIOVERSION N/A    P   TONSILLECTOMY      Allergies  Allergen Reactions   Penicillins Anaphylaxis    Prior to Admission medications   Medication Sig Start Date End Date Taking? Authorizing Provider  acetaminophen (TYLENOL) 500 MG tablet Take 1,000 mg by mouth at bedtime.   Yes [provider]  apixaban (ELIQUIS) 2.5 MG TABS tablet Take 1 tablet (2.5 mg total) by mouth 2 (two) times daily. 01/16/22  Yes Byrnett, Forest Gleason, MD  Azelastine HCl 137 MCG/SPRAY SOLN SMARTSIG:2 Puff(s) Both Nares Twice Daily 12/08/21  Yes [provider]  cetirizine (ZYRTEC) 10 MG tablet SMARTSIG:1 pill By Mouth Daily PRN 12/13/21  Yes [provider]  hydrOXYzine (VISTARIL) 25 MG capsule Take 25-50 mg by mouth at bedtime. 07/20/22  Yes [provider]  metoprolol succinate (TOPROL-XL) 25 MG 24 hr tablet Take 25 mg by mouth every  morning. 05/03/21  Yes [provider]  mirtazapine (REMERON) 7.5 MG tablet Take 7.5 mg by mouth at bedtime. 08/29/22  Yes [provider]  vitamin B-12 (CYANOCOBALAMIN) 500 MCG tablet Take 1 tablet (500 mcg total) by mouth daily. 12/29/21  Yes Earlie Server, MD  furosemide (LASIX) 20 MG tablet Take 2 tablets (40 mg total) by mouth every morning. 01/28/22 02/27/22  Barb Merino, MD  OVER THE COUNTER MEDICATION Take 2 tablets by mouth at bedtime. Seratame Patient not taking: Reported on 09/09/2022    [provider]  OVER THE COUNTER MEDICATION Take 1 tablet by mouth at bedtime. Relaxium Patient not taking: Reported on 09/09/2022    [provider]    Social History   Socioeconomic History   Marital status: Married    Spouse name: Not on file   Number of children: Not on file   Years of education: Not on file   Highest education level: Not on file  Occupational History   Not on file  Tobacco Use   Smoking status: Former    Packs/day: 0.25    Years: 8.00    Total pack years: 2.00    Types: Cigarettes    Quit date: 15    Years since quitting: 84.1   Smokeless tobacco: Never  Vaping Use   Vaping Use: Never used  Substance and Sexual Activity   Alcohol use: No   Drug use: Never   Sexual activity: Not on file  Other Topics Concern   Not on file  Social History Narrative   Not on file   Social Determinants of Health   Financial Resource Strain: Not on file  Food Insecurity: Not on file  Transportation Needs: Not on file  Physical Activity: Not on file  Stress: Not on file  Social Connections: Not on file  Intimate Partner Violence: Not on file     Family History  Problem Relation Age of Onset   Heart disease Sister    Hypertension Other     ROS: Otherwise negative unless mentioned in HPI  Physical Examination  Vitals:   09/10/22 0645 09/10/22 0700  BP: 117/66 122/72  Pulse: (!) 58 61  Resp: 17 17  Temp:    SpO2: 96% 98%   Body  mass index is 24.69 kg/m.  General:  WDWN in NAD Gait: Not observed HENT: WNL, normocephalic Pulmonary: normal non-labored breathing, without Rales, rhonchi,  wheezing Cardiac: irregular, HX AFIB, without  Murmurs, rubs or gallops; without carotid bruits Abdomen: Positive Bowel Sounds, soft, NT/ND, no masses Skin: without rashes Vascular Exam/Pulses: Bilateral lower extremity pulses by Doppler only. Extremities: without ischemic changes, without Gangrene , with cellulitis; without open wounds;  Musculoskeletal: no muscle wasting or atrophy  Neurologic: A&O X 3;  No focal weakness or paresthesias are detected; speech is fluent/normal Psychiatric:  The pt has Normal affect. Lymph:  Unremarkable  CBC    Component Value Date/Time   WBC 5.6 09/10/2022 0441   RBC 3.52 (L) 09/10/2022 0441   HGB 11.9 (L) 09/10/2022 0441   HCT 37.1 (L) 09/10/2022 0441   PLT 125 (L) 09/10/2022 0441   MCV 105.4 (H) 09/10/2022 0441   MCH 33.8 09/10/2022 0441   MCHC 32.1 09/10/2022 0441   RDW 18.2 (H) 09/10/2022 0441   LYMPHSABS 1.2 01/27/2022 1520   MONOABS 0.6 01/27/2022 1520   EOSABS 0.1 01/27/2022 1520   BASOSABS 0.1 01/27/2022 1520    BMET    Component Value Date/Time   NA 133 (L) 09/10/2022 0441   K 4.3 09/10/2022 0441   CL 98 09/10/2022 0441   CO2 26 09/10/2022 0441   GLUCOSE 91 09/10/2022 0441   BUN 48 (H) 09/10/2022 0441   CREATININE 1.56 (H) 09/10/2022 0441   CALCIUM 8.3 (L) 09/10/2022 0441   GFRNONAA 41 (L) 09/10/2022 0441   GFRAA >60 05/10/2016 1800    COAGS: No results found for: "INR", "PROTIME"   Non-Invasive Vascular Imaging:   EXAM: CT ANGIOGRAPHY CHEST WITH CONTRAST  IMPRESSION: 1. Positive examination for acute pulmonary embolus involving bilateral lower lobe pulmonary arteries. Elevated RV to LV ratio of 1.8 may indicate right heart strain. 2. Cardiac enlargement with bilateral pleural effusions and probable interstitial edema. 3. Probable hepatic cirrhosis with  upper abdominal ascites. 4. 6 mm nonobstructing stones seen in the right kidney. 5. Aortic atherosclerosis.  Statin:  No. Beta Blocker:  Yes.   Aspirin:  No. ACEI:  No. ARB:  No. CCB use:  No Other antiplatelets/anticoagulants:  Yes.   Eliquis 2.5 BID   ASSESSMENT/PLAN: This is a 89 y.o. 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   -PLAN: Optimize the patient's congestive heart failure at this time.  A full cardiac workup would be in his best interest.  Post optimization plan would be to rescan patient's chest for pulmonary embolism clot burden and determine at that time if a pulmonary thrombectomy would benefit the patient.  Lower extremity venous Dopplers to rule out DVT are needed.  Patient may benefit from an IVC filter if the patient is positive for DVT.  Plan was discussed with Dr. Ella Jubilee MD and he is in agreement with the plan   Drema Pry [facility] 09/10/2022 7:29 AM

## 2022-09-10 NOTE — ED Notes (Signed)
Pt to be transported via stretcher to room. Stable at time of departure

## 2022-09-10 NOTE — Progress Notes (Signed)
[facility] Progress Note  Patient: Marcus Kemp    [id number]  DOA: 09/09/2022     Date of Service: the patient was seen and examined on 09/10/2022  Chief Complaint  Patient presents with   Leg Swelling   Brief hospital course: Marcus Kemp is a 89 y.o. 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  Patient has sensorineural deafness.  Denies any chest pain, no palpitations, no abdominal pain.   ED Course:  creatinine 1.83, AKI, BNP 1637 very elevated, troponin 49--48 most likely due to demand ischemia, hemoglobin 12.3, elevated MCV.  CXR consistent with pulmonary edema. Patient was given Lasix IV in the ED and [facility] hospitalist consulted for further management   Assessment and Plan: Principal Problem: Acute pulmonary embolism  Active problems: CHF (congestive heart failure) (HCC) AKI   Acute hypoxic respiratory failure 2/2 acute PE, pul. edema, and dCHF exacerbation Continue supplemental O2 admission gradually wean off Patient was cyanotic on exam, patient was placed on nonrebreather Stat ABG was ordered Critical care consulted D-dimer pending Patient may need VQ scan to rule out PE if D-dimer elevated ABG consistent with hypoxia    Acute pulmonary embolism D-dimer elevated,  CTA positive for PE with right heart strain RV:LV 1.8, Cardiac enlargement with bilateral pleural effusions and probable interstitial edema. Probable hepatic cirrhosis with upper abdominal ascites. 6 mm nonobstructing stones seen in the right kidney. Continue IV heparin infusion Venous duplex b/l lower extremity negative for DVT Vascular surgery consulted for possible thrombectomy Follow 2D echocardiogram   Acute on chronic diastolic CHF  exacerbation Presented with pulmonary edema and bilateral lower extremity edema S/p Lasix IV 60 mg x 1 dose given in the ED Started Lasix IV infusion for slow diuresis due to renal failure Troponin slightly elevated most likely demand ischemia, patient denies any chest pain Follow 2D echocardiogram     AKI, possible cardiorenal syndrome Monitor urine output and renal functions daily Insert Foley catheter for close urine output monitoring Avoid nephrotoxic medications, use renally dose medications US renal: No collecting system dilatation.  Bilateral renal cysts. Ascites. Cr 1.83--1.56      Hypertension, A- flutter s/p ablation Held Lopressor due to low blood pressure Held Eliquis 2.5 mg p.o. twice daily home dose Started heparin IV infusion due to acute PE Monitor BP and titrate medications accordingly Continue to monitor on telemetry    Overall poor prognosis due to advanced age, palliative care consulted for goals of care discussion with family.  Currently patient is full code.  Body mass index is 24.69 kg/m.  Interventions:     Diet: Heart healthy diet, fluid striction 1.5 L/day DVT Prophylaxis: Therapeutic Anticoagulation with heparin IV infusion    Advance goals of care discussion: Full code  Family Communication: family was not present at bedside, at the time of interview.  The pt provided permission to discuss medical plan with the family. Opportunity was given to ask question and all questions were answered satisfactorily.   Disposition:  Pt is from Home, admitted with acute pulmonary embolism, respiratory failure, diastolic CHF, still has PE on IV heparin infusion, which precludes a safe discharge. Discharge to TBD, needs PT and OT eval, when clinically stable may need few days to improve.  Subjective: No significant events overnight,  patient's breathing is improving and lower extremity edema is improving.  Patient denied any chest pain or palpitation, no shortness  of breath, no abdominal pain.  Physical Exam: General: NAD, lying comfortably Appear in no distress, affect appropriate Eyes: PERRLA ENT: Oral Mucosa Clear, moist  Neck: no JVD,  Cardiovascular: S1 and S2 Present, no Murmur,  Respiratory: good respiratory effort, Bilateral Air entry equal, mild bibasilar crackles, no wheezes  Abdomen: Bowel Sound present, Soft and no tenderness,  Skin: no rashes Extremities: 2-3+ Pedal edema, no calf tenderness.  Neurologic: without any new focal findings Gait not checked due to patient safety concerns  Vitals:   09/10/22 1130 09/10/22 1150 09/10/22 1245 09/10/22 1315  BP: 107/67 104/63 104/77 109/61  Pulse:  60 65 (!) 55  Resp: 18 19 18 14  $ Temp:  98 F (36.7 C)    TempSrc:  Oral    SpO2: 100% 100% 100% 100%  Weight:      Height:        Intake/Output Summary (Last 24 hours) at 09/10/2022 1334 Last data filed at 09/10/2022 [id number] Gross per 24 hour  Intake 174.28 ml  Output 2300 ml  Net -2125.72 ml   Filed Weights   09/09/22 1352  Weight: 69.4 kg    Data Reviewed: I have personally reviewed and interpreted daily labs, tele strips, imagings as discussed above. I reviewed all nursing notes, pharmacy notes, vitals, pertinent old records I have discussed plan of care as described above with RN and patient/family.  CBC: Recent Labs  Lab 09/09/22 1356 09/10/22 0441  WBC 5.1 5.6  HGB 12.3* 11.9*  HCT 37.9* 37.1*  MCV 104.7* 105.4*  PLT 134* 0000000*   Basic Metabolic Panel: Recent Labs  Lab 09/09/22 1356 09/10/22 0441  NA 136 133*  K 4.7 4.3  CL 102 98  CO2 26 26  GLUCOSE 152* 91  BUN 52* 48*  CREATININE 1.83* 1.56*  CALCIUM 8.7* 8.3*  MG  --  2.2  PHOS  --  4.6    Studies: US Venous Img Lower Bilateral (DVT)  Result Date: 09/10/2022 CLINICAL DATA:  Pulmonary embolism. EXAM: BILATERAL LOWER EXTREMITY VENOUS DOPPLER ULTRASOUND TECHNIQUE: Gray-scale sonography with graded compression, as well as color Doppler and duplex  ultrasound were performed to evaluate the lower extremity deep venous systems from the level of the common femoral vein and including the common femoral, femoral, profunda femoral, popliteal and calf veins including the posterior tibial, peroneal and gastrocnemius veins when visible. The superficial great saphenous vein was also interrogated. Spectral Doppler was utilized to evaluate flow at rest and with distal augmentation maneuvers in the common femoral, femoral and popliteal veins. COMPARISON:  None Available. FINDINGS: RIGHT LOWER EXTREMITY Common Femoral Vein: No evidence of thrombus. Normal compressibility, respiratory phasicity and response to augmentation. Saphenofemoral Junction: No evidence of thrombus. Normal compressibility and flow on color Doppler imaging. Profunda Femoral Vein: No evidence of thrombus. Normal compressibility and flow on color Doppler imaging. Femoral Vein: No evidence of thrombus. Normal compressibility, respiratory phasicity and response to augmentation. Popliteal Vein: No evidence of thrombus. Normal compressibility, respiratory phasicity and response to augmentation. Calf Veins: No evidence of thrombus. Normal compressibility and flow on color Doppler imaging. Superficial Great Saphenous Vein: No evidence of thrombus. Normal compressibility. Venous Reflux:  None. Other Findings:  None. LEFT LOWER EXTREMITY Common Femoral Vein: No evidence of thrombus. Normal compressibility, respiratory phasicity and response to augmentation. Saphenofemoral Junction: No evidence of thrombus. Normal compressibility and flow on color Doppler  imaging. Profunda Femoral Vein: No evidence of thrombus. Normal compressibility and flow on color Doppler imaging. Femoral Vein: No evidence of thrombus. Normal compressibility, respiratory phasicity and response to augmentation. Popliteal Vein: No evidence of thrombus. Normal compressibility, respiratory phasicity and response to augmentation. Calf Veins: No  evidence of thrombus. Normal compressibility and flow on color Doppler imaging. Superficial Great Saphenous Vein: No evidence of thrombus. Normal compressibility. Venous Reflux:  None. Other Findings:  None. IMPRESSION: No evidence of deep venous thrombosis in either lower extremity. Electronically Signed   By: Valetta Mole M.D.   On: 09/10/2022 11:44   CT Angio Chest Pulmonary Embolism (PE) W or WO Contrast  Result Date: 09/09/2022 CLINICAL DATA:  Pulmonary embolus suspected with high probability. Bilateral leg swelling and fluid buildup. Some shortness of breath. EXAM: CT ANGIOGRAPHY CHEST WITH CONTRAST TECHNIQUE: Multidetector CT imaging of the chest was performed using the standard protocol during bolus administration of intravenous contrast. Multiplanar CT image reconstructions and MIPs were obtained to evaluate the vascular anatomy. RADIATION DOSE REDUCTION: This exam was performed according to the departmental dose-optimization program which includes automated exposure control, adjustment of the mA and/or kV according to patient size and/or use of iterative reconstruction technique. CONTRAST:  60m OMNIPAQUE IOHEXOL 350 MG/ML SOLN COMPARISON:  06/26/2021 FINDINGS: Cardiovascular: There is good opacification of the central and segmental pulmonary arteries. Examination is technically adequate. Filling defects are demonstrated in multiple bilateral lower lobe segmental and subsegmental pulmonary arteries consistent with acute pulmonary embolus. The RV to LV ratio is elevated at 1.8. This may indicate evidence of right heart strain although the clot burden is relatively low and changes could also be related to underlying heart failure. Clinical correlation suggested. Diffuse cardiac enlargement. Normal caliber thoracic aorta. Calcification of the aorta and coronary arteries. Mediastinum/Nodes: Thyroid gland is unremarkable. Esophagus is decompressed. No significant lymphadenopathy. Lungs/Pleura: Small  bilateral pleural effusions with basilar atelectasis. Peripheral interstitial changes in the lungs likely represents early pulmonary edema. No pneumothorax. Upper Abdomen: Probable hepatic cirrhosis with nodular liver contour and enlarged lateral segment left and caudate lobes of the liver. Gallbladder wall is thickened, likely related to liver disease. Moderate ascites in the upper abdomen. 6 mm stone in the right kidney. No hydronephrosis or hydroureter. Musculoskeletal: Degenerative changes in the spine. Old bilateral rib fractures. Review of the MIP images confirms the above findings. IMPRESSION: 1. Positive examination for acute pulmonary embolus involving bilateral lower lobe pulmonary arteries. Elevated RV to LV ratio of 1.8 may indicate right heart strain. 2. Cardiac enlargement with bilateral pleural effusions and probable interstitial edema. 3. Probable hepatic cirrhosis with upper abdominal ascites. 4. 6 mm nonobstructing stones seen in the right kidney. 5. Aortic atherosclerosis. Critical Value/emergent results were called by telephone at the time of interpretation on 09/09/2022 at 9:02 pm [facility] verbally acknowledged these results. Electronically Signed   By: WLucienne CapersM.D.   On: 09/09/2022 21:07   [place]  Result Date: 09/09/2022 CLINICAL DATA:  Acute kidney injury EXAM: RENAL / URINARY TRACT ULTRASOUND COMPLETE COMPARISON:  CT 12/24/2021 FINDINGS: Right Kidney: Renal measurements: 9.9 x 5.3 x 5.2 = volume: 142 mL. Echogenicity within normal limits. No mass or hydronephrosis visualized. Central presumed parapelvic renal cyst as seen on prior CT scan measuring 19 mm. No collecting system dilatation or perinephric fluid. Left Kidney: Renal measurements: 9.9 x 4.4 x 4.5 = volume: 103 mL. There are some near anechoic foci in the left kidney with through transmission measuring  2.2 x 1.9 x 1.8 cm and 2.1 by 2.2 x 1.8 cm. On prior CT scan these are also felt to be cysts.  There is however a lesion in the posterior aspect of the left kidney on the prior measuring 19 mm which was a solid lesion. This is not clearly seen on today's examination. Bladder: Appears normal for degree of bladder distention. Other: Scattered ascites IMPRESSION: No collecting system dilatation.  Bilateral renal cysts. Ascites. Prior CT scan described enhancing mass in the left kidney. This is not clearly seen on this examination today. On the prior study this is felt to be an aggressive lesion or potentially neoplasm. Please correlate with known history and workup Electronically Signed   By: Jill Side M.D.   On: 09/09/2022 19:32   DG Chest 2 View  Result Date: 09/09/2022 CLINICAL DATA:  Chest pain. EXAM: CHEST - 2 VIEW COMPARISON:  January 27, 2022. FINDINGS: Stable cardiomegaly. Mild bibasilar subsegmental atelectasis or possibly edema is noted. Bony thorax is unremarkable. IMPRESSION: Mild bibasilar subsegmental atelectasis or possibly edema is noted. Aortic Atherosclerosis (ICD10-I70.0). Electronically Signed   By: Marijo Conception M.D.   On: 09/09/2022 14:18    Scheduled Meds:  loratadine  10 mg Oral Daily   sodium chloride flush  3 mL Intravenous Q12H   Continuous Infusions:  sodium chloride     furosemide (LASIX) 200 mg in dextrose 5 % 100 mL (2 mg/mL) infusion 4 mg/hr (09/10/22 0650)   heparin 950 Units/hr (09/10/22 0930)   PRN Meds: sodium chloride, acetaminophen **OR** acetaminophen, sodium chloride flush  Time spent: 55 minutes  Author: Val Riles. MD [facility] 09/10/2022 1:34 PM  To reach On-call, see care teams to locate the attending and reach out to them via [email or web address]. If 7PM-7AM, please contact night-coverage If you still have difficulty reaching the attending provider, please page the [facility] (Director on Call) for [facility] on amion for assistance.

## 2022-09-10 NOTE — ED Notes (Signed)
Heparin drip paused per pharmacy

## 2022-09-10 NOTE — ED Notes (Signed)
Received call from lab to redraw PTT as it was clotted

## 2022-09-10 NOTE — ED Notes (Signed)
Pt eating lunch. Requested toothbrush and water to brush teeth after finished with meal.Wife at bedside.

## 2022-09-10 NOTE — ED Notes (Signed)
Ptt >200

## 2022-09-10 NOTE — Discharge Instructions (Signed)

## 2022-09-11 DIAGNOSIS — I2609 Other pulmonary embolism with acute cor pulmonale: Secondary | ICD-10-CM | POA: Diagnosis not present

## 2022-09-11 LAB — BLOOD GAS, VENOUS
Acid-Base Excess: 10.4 mmol/L — ABNORMAL HIGH (ref 0.0–2.0)
Bicarbonate: 35.7 mmol/L — ABNORMAL HIGH (ref 20.0–28.0)
O2 Saturation: 99.7 %
Patient temperature: 37
pCO2, Ven: 49 mmHg (ref 44–60)
pH, Ven: 7.47 — ABNORMAL HIGH (ref 7.25–7.43)
pO2, Ven: 138 mmHg — ABNORMAL HIGH (ref 32–45)

## 2022-09-11 LAB — CBC
HCT: 37.2 % — ABNORMAL LOW (ref 39.0–52.0)
Hemoglobin: 12.3 g/dL — ABNORMAL LOW (ref 13.0–17.0)
MCH: 34.3 pg — ABNORMAL HIGH (ref 26.0–34.0)
MCHC: 33.1 g/dL (ref 30.0–36.0)
MCV: 103.6 fL — ABNORMAL HIGH (ref 80.0–100.0)
Platelets: 112 10*3/uL — ABNORMAL LOW (ref 150–400)
RBC: 3.59 MIL/uL — ABNORMAL LOW (ref 4.22–5.81)
RDW: 17.4 % — ABNORMAL HIGH (ref 11.5–15.5)
WBC: 5.8 10*3/uL (ref 4.0–10.5)
nRBC: 0.3 % — ABNORMAL HIGH (ref 0.0–0.2)

## 2022-09-11 LAB — BASIC METABOLIC PANEL
Anion gap: 10 (ref 5–15)
BUN: 37 mg/dL — ABNORMAL HIGH (ref 8–23)
CO2: 32 mmol/L (ref 22–32)
Calcium: 8.8 mg/dL — ABNORMAL LOW (ref 8.9–10.3)
Chloride: 98 mmol/L (ref 98–111)
Creatinine, Ser: 1.28 mg/dL — ABNORMAL HIGH (ref 0.61–1.24)
GFR, Estimated: 52 mL/min — ABNORMAL LOW (ref >60)
Glucose, Bld: 84 mg/dL (ref 70–99)
Potassium: 3.6 mmol/L (ref 3.5–5.1)
Sodium: 137 mmol/L (ref 135–145)

## 2022-09-11 LAB — APTT
aPTT: 130 seconds — ABNORMAL HIGH (ref 24–36)
aPTT: 61 seconds — ABNORMAL HIGH (ref 24–36)
aPTT: 79 seconds — ABNORMAL HIGH (ref 24–36)

## 2022-09-11 LAB — MAGNESIUM: Magnesium: 1.9 mg/dL (ref 1.7–2.4)

## 2022-09-11 LAB — PHOSPHORUS: Phosphorus: 3.1 mg/dL (ref 2.5–4.6)

## 2022-09-11 LAB — HEPARIN LEVEL (UNFRACTIONATED): Heparin Unfractionated: 0.59 IU/mL (ref 0.30–0.70)

## 2022-09-11 MED ORDER — FUROSEMIDE 10 MG/ML IJ SOLN
20.0000 mg | Freq: Two times a day (BID) | INTRAMUSCULAR | Status: DC
  Administered 2022-09-11 – 2022-09-13 (×4): 20 mg via INTRAVENOUS
  Filled 2022-09-11 (×4): qty 2

## 2022-09-11 MED ORDER — HEPARIN (PORCINE) 25000 UT/250ML-% IV SOLN
950.0000 [IU]/h | INTRAVENOUS | Status: DC
  Administered 2022-09-12: 650 [IU]/h via INTRAVENOUS
  Filled 2022-09-11 (×2): qty 250

## 2022-09-11 MED ORDER — MIDODRINE HCL 5 MG PO TABS
5.0000 mg | ORAL_TABLET | Freq: Three times a day (TID) | ORAL | Status: DC
  Administered 2022-09-11 – 2022-09-15 (×11): 5 mg via ORAL
  Filled 2022-09-11 (×11): qty 1

## 2022-09-11 MED ORDER — HEPARIN BOLUS VIA INFUSION
900.0000 [IU] | Freq: Once | INTRAVENOUS | Status: AC
  Administered 2022-09-11: 900 [IU] via INTRAVENOUS
  Filled 2022-09-11: qty 900

## 2022-09-11 NOTE — Progress Notes (Signed)
[facility] Progress Note  Patient: Marcus Kemp    [id number]  DOA: 09/09/2022     Date of Service: the patient was seen and examined on 09/11/2022  Chief Complaint  Patient presents with   Leg Swelling   Brief hospital course: Marcus Kemp is a 89 y.o. 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  Patient has sensorineural deafness.  Denies any chest pain, no palpitations, no abdominal pain.   ED Course:  creatinine 1.83, AKI, BNP 1637 very elevated, troponin 49--48 most likely due to demand ischemia, hemoglobin 12.3, elevated MCV.  CXR consistent with pulmonary edema. Patient was given Lasix IV in the ED and [facility] hospitalist consulted for further management   Assessment and Plan: Principal Problem: Acute pulmonary embolism  Active problems: CHF (congestive heart failure) ([place]) AKI   # Acute hypoxic respiratory failure 2/2 acute PE, pul. edema, and dCHF exacerbation Continue supplemental O2 admission gradually wean off Patient was cyanotic on exam, patient was placed on nonrebreather ABG consistent with hypoxia, Critical care was consulted, pt did not require any intervention D-dimer elevated, CTA positive for PE    Acute pulmonary embolism D-dimer elevated,  CTA positive for PE with right heart strain RV:LV 1.8, Cardiac enlargement with bilateral pleural effusions and probable interstitial edema. Probable hepatic cirrhosis with upper abdominal ascites. 6 mm nonobstructing stones seen in the right kidney. TTE LVEF 55 to 60%, LV hypertrophy, severe pulmonary hypertension, mitral valve regurgitation and tricuspid valve regurgitation moderate to severe. Venous duplex b/l lower extremity negative for DVT Continue IV heparin  infusion Vascular surgery consulted, recommended continue IV heparin infusion, optimize CHF and may need to repeat CTA for clot burden to decide for thrombectomy.    Acute on chronic diastolic CHF exacerbation Presented with pulmonary edema and bilateral lower extremity edema S/p Lasix IV 60 mg x 1 dose given in the ED S/p Lasix IV infusion DC'd on 2/17, started Lasix 20 mg IV twice daily  Troponin slightly elevated most likely demand ischemia, patient denies any chest pain TTE as above     AKI, possible cardiorenal syndrome Monitor urine output and renal functions daily Insert Foley catheter for close urine output monitoring Avoid nephrotoxic medications, use renally dose medications US renal: No collecting system dilatation.  Bilateral renal cysts. Ascites. Cr 1.83--1.56--1.28      Hypertension, A- flutter s/p ablation Held Lopressor due to low blood pressure Held Eliquis 2.5 mg p.o. twice daily home dose Started heparin IV infusion due to acute PE 2/17 started midodrine 5 mg p.o. 3 times daily with holding parameters due to low BP Monitor BP and titrate medications accordingly Continue to monitor on telemetry   Vision loss in the left eye, most likely due to cataract Patient is poor historian, patient wife mentioned that patient is having gradual decrease in the vision in the left eye possible due to cataract.  She called ophthalmology appointment which is after few weeks.  No any new neurological changes, no any acute vision loss, less likely stroke.  We will continue to monitor.   Overall poor prognosis due to advanced age, palliative care consulted for goals of care discussion with family.  Currently patient is full code.  Body mass index is 24.69 kg/m.  Interventions:  Diet: Heart healthy diet, fluid striction 1.5 L/day DVT Prophylaxis: Therapeutic Anticoagulation with heparin IV infusion    Advance goals of care discussion: Full code  Family Communication:  family was not present at bedside, at the time of interview.  The pt provided permission to discuss medical plan with the family. Opportunity was given to ask question and all questions were answered satisfactorily.   Disposition:  Pt is from Home, admitted with acute pulmonary embolism, respiratory failure, diastolic CHF, still has PE on IV heparin infusion, which precludes a safe discharge. Discharge to TBD, needs PT and OT eval, when clinically stable may need few days to improve.  Subjective: No significant events overnight, patient feels improvement in the generalized weakness and shortness of breath.  Denies any chest pain or palpitations, no shortness of breath, patient stated that he feels a lot better after diuresis. Patient was complaining of gradual vision loss in the left eye most likely due to cataract.  As per patient's wife it has been happening for past few months gradually getting worse in the last week, less likely stroke, no any other neurological symptoms.  [name] did call ophthalmologist, she got an appointment after few weeks.   Physical Exam: General: NAD, lying comfortably Appear in no distress, affect appropriate Eyes: PERRLA, very poor vision in the left eye most likely due to cataract ENT: Oral Mucosa Clear, moist, b/l SNHL Neck: no JVD,  Cardiovascular: S1 and S2 Present, no Murmur,  Respiratory: good respiratory effort, Bilateral Air entry equal, mild bibasilar crackles, no wheezes  Abdomen: Bowel Sound present, Soft and no tenderness,  Skin: no rashes Extremities: No calf tenderness.  Edema resolved Neurologic: without any new focal findings Gait not checked due to patient safety concerns  Vitals:   09/10/22 2224 09/10/22 2322 09/11/22 0444 09/11/22 1132  BP:  116/62 103/65 90/62  Pulse:  69 72 73  Resp:  20  16  Temp:  (!) 97.5 F (36.4 C) 98.2 F (36.8 C) 97.7 F (36.5 C)  TempSrc:  Oral Oral Oral  SpO2:  99% 93% 97%  Weight: 61.5 kg      Height: 5' 6"$  (1.676 m)       Intake/Output Summary (Last 24 hours) at 09/11/2022 1405 Last data filed at 09/11/2022 1316 Gross per 24 hour  Intake 534.23 ml  Output 3625 ml  Net -3090.77 ml   Filed Weights   09/09/22 1352 09/10/22 2224  Weight: 69.4 kg 61.5 kg    Data Reviewed: I have personally reviewed and interpreted daily labs, tele strips, imagings as discussed above. I reviewed all nursing notes, pharmacy notes, vitals, pertinent old records I have discussed plan of care as described above with RN and patient/family.  CBC: Recent Labs  Lab 09/09/22 1356 09/10/22 0441 09/11/22 0402  WBC 5.1 5.6 5.8  HGB 12.3* 11.9* 12.3*  HCT 37.9* 37.1* 37.2*  MCV 104.7* 105.4* 103.6*  PLT 134* 125* [id number]*   Basic Metabolic Panel: Recent Labs  Lab 09/09/22 1356 09/10/22 0441 09/11/22 0402  NA 136 133* 137  K 4.7 4.3 3.6  CL 102 98 98  CO2 26 26 32  GLUCOSE 152* 91 84  BUN 52* 48* 37*  CREATININE 1.83* 1.56* 1.28*  CALCIUM 8.7* 8.3* 8.8*  MG  --  2.2 1.9  PHOS  --  4.6 3.1    Studies: ECHOCARDIOGRAM COMPLETE  Result Date: 09/10/2022    ECHOCARDIOGRAM REPORT   Patient Name:   Marcus Kemp Date of Exam:  09/10/2022 Medical Rec #:  [id number]         Height:       66.0 in Accession #:    [id number]        Weight:       153.0 lb Date of Birth:  05/10/1925        BSA:          1.785 m Patient Age:    89 years          BP:           108/67 mmHg Patient Gender: M                 HR:           62 bpm. Exam Location:  [facility] Procedure: 2D Echo, Cardiac Doppler and Color Doppler Indications:     CHF  History:         Patient has prior history of Echocardiogram examinations, most                  recent 01/28/2022. CHF, Arrythmias:Atrial Fibrillation,                  Signs/Symptoms:Edema; Risk Factors:Dyslipidemia. Pulmonary                  embolus, Rheumatic fever age 43.  Sonographer:     Wenda Low Referring Phys:  [id number] Val Riles Diagnosing Phys: Ida Rogue MD  IMPRESSIONS  1. Left ventricular ejection fraction, by estimation, is 55 to 60%. The left ventricle has normal function. The left ventricle has no regional wall motion abnormalities. There is moderate left ventricular hypertrophy. Left ventricular diastolic parameters are indeterminate. There is the interventricular septum is flattened in systole and diastole, consistent with right ventricular pressure and volume overload.  2. Right ventricular systolic function is mildly reduced. The right ventricular size is moderately enlarged. There is severely elevated pulmonary artery systolic pressure. The estimated right ventricular systolic pressure is [id number] mmHg.  3. Left atrial size was moderately dilated.  4. Right atrial size was mildly dilated.  5. The mitral valve is normal in structure. Moderate to severe mitral valve regurgitation. No evidence of mitral stenosis.  6. Tricuspid valve regurgitation is moderate to severe.  7. The aortic valve has an indeterminant number of cusps. Aortic valve regurgitation is not visualized. Aortic valve sclerosis/calcification is present, without any evidence of aortic stenosis.  8. There is borderline dilatation of the ascending aorta, measuring 36 mm.  9. The inferior vena cava is dilated in size with <50% respiratory variability, suggesting right atrial pressure of 15 mmHg. FINDINGS  Left Ventricle: Left ventricular ejection fraction, by estimation, is 55 to 60%. The left ventricle has normal function. The left ventricle has no regional wall motion abnormalities. The left ventricular internal cavity size was normal in size. There is  moderate left ventricular hypertrophy. The interventricular septum is flattened in systole and diastole, consistent with right ventricular pressure and volume overload. Left ventricular diastolic parameters are indeterminate. Right Ventricle: The right ventricular size is moderately enlarged. No increase in right ventricular wall thickness. Right  ventricular systolic function is mildly reduced. There is severely elevated pulmonary artery systolic pressure. The tricuspid regurgitant velocity is 3.72 m/s, and with an assumed right atrial pressure of 15 mmHg, the estimated right ventricular systolic pressure is [id number] mmHg. Left Atrium: Left atrial size was moderately dilated. Right Atrium: Right atrial size was mildly dilated. Pericardium: There is no evidence of pericardial  effusion. Mitral Valve: The mitral valve is normal in structure. There is moderate thickening of the mitral valve leaflet(s). There is moderate calcification of the mitral valve leaflet(s). Moderate to severe mitral valve regurgitation, with posteriorly-directed jet. No evidence of mitral valve stenosis. MV peak gradient, 40.4 mmHg. The mean mitral valve gradient is 25.5 mmHg. Tricuspid Valve: The tricuspid valve is normal in structure. Tricuspid valve regurgitation is moderate to severe. No evidence of tricuspid stenosis. Aortic Valve: The aortic valve has an indeterminant number of cusps. Aortic valve regurgitation is not visualized. Aortic valve sclerosis/calcification is present, without any evidence of aortic stenosis. Aortic valve mean gradient measures 4.0 mmHg. Aortic valve peak gradient measures 7.3 mmHg. Aortic valve area, by VTI measures 2.23 cm. Pulmonic Valve: The pulmonic valve was normal in structure. Pulmonic valve regurgitation is mild. No evidence of pulmonic stenosis. Aorta: The aortic root is normal in size and structure. There is borderline dilatation of the ascending aorta, measuring 36 mm. Venous: The inferior vena cava is dilated in size with less than 50% respiratory variability, suggesting right atrial pressure of 15 mmHg. IAS/Shunts: No atrial level shunt detected by color flow Doppler.  LEFT VENTRICLE PLAX 2D LVIDd:         3.70 cm LVIDs:         2.50 cm LV PW:         1.10 cm LV IVS:        1.30 cm LVOT diam:     2.00 cm LV SV:         60 LV SV Index:   34 LVOT  Area:     3.14 cm  RIGHT VENTRICLE RV Basal diam:  4.80 cm RV Mid diam:    4.00 cm RV S prime:     9.03 cm/s LEFT ATRIUM             Index        RIGHT ATRIUM           Index LA diam:        4.40 cm 2.47 cm/m   RA Area:     22.90 cm LA Vol (A2C):   97.4 ml 54.58 ml/m  RA Volume:   68.80 ml  38.55 ml/m LA Vol (A4C):   87.2 ml 48.86 ml/m LA Biplane Vol: 95.4 ml 53.46 ml/m  AORTIC VALVE                    PULMONIC VALVE AV Area (Vmax):    2.30 cm     PV Vmax:       0.91 m/s AV Area (Vmean):   2.18 cm     PV Peak grad:  3.3 mmHg AV Area (VTI):     2.23 cm AV Vmax:           135.00 cm/s AV Vmean:          89.600 cm/s AV VTI:            0.270 m AV Peak Grad:      7.3 mmHg AV Mean Grad:      4.0 mmHg LVOT Vmax:         99.00 cm/s LVOT Vmean:        62.100 cm/s LVOT VTI:          0.192 m LVOT/AV VTI ratio: 0.71  AORTA Ao Root diam: 3.40 cm Ao Asc diam:  3.60 cm MITRAL VALVE  TRICUSPID VALVE MV Area (PHT): 4.29 cm       TR Peak grad:   55.4 mmHg MV Area VTI:   0.66 cm       TR Vmax:        372.00 cm/s MV Peak grad:  40.4 mmHg MV Mean grad:  25.5 mmHg      SHUNTS MV Vmax:       3.18 m/s       Systemic VTI:  0.19 m MV Vmean:      195.7 cm/s     Systemic Diam: 2.00 cm MV Decel Time: 177 msec MR Peak grad:    72.6 mmHg MR Mean grad:    49.0 mmHg MR Vmax:         426.00 cm/s MR Vmean:        327.0 cm/s MR PISA:         4.02 cm MR PISA Eff ROA: 80 mm MR PISA Radius:  0.80 cm MV E velocity: 155.00 cm/s Ida Rogue MD Electronically signed by Ida Rogue MD Signature Date/Time: 09/10/2022/5:07:49 PM    Final     Scheduled Meds:  loratadine  10 mg Oral Daily   sodium chloride flush  3 mL Intravenous Q12H   Continuous Infusions:  sodium chloride     furosemide (LASIX) 200 mg in dextrose 5 % 100 mL (2 mg/mL) infusion 4 mg/hr (09/11/22 0617)   heparin 550 Units/hr (09/11/22 0617)   PRN Meds: sodium chloride, acetaminophen **OR** acetaminophen, sodium chloride flush  Time spent: 50  minutes  Author: Val Riles. MD [facility] 09/11/2022 2:05 PM  To reach On-call, see care teams to locate the attending and reach out to them via [email or web address]. If 7PM-7AM, please contact night-coverage If you still have difficulty reaching the attending provider, please page the [facility] (Director on Call) for [facility] on amion for assistance.

## 2022-09-11 NOTE — Consult Note (Addendum)
ANTICOAGULATION CONSULT NOTE -   Pharmacy Consult for heparin infusion Indication: pulmonary embolus  Allergies  Allergen Reactions   Penicillins Anaphylaxis    Patient Measurements: Height: 5' 6"$  (167.6 cm) Weight: 61.5 kg (135 lb 9.3 oz) IBW/kg (Calculated) : 63.8 Heparin Dosing Weight: 69.4 kg  Vital Signs: Temp: 97.9 F (36.6 C) (02/17 2045) Temp Source: Oral (02/17 2045) BP: 111/67 (02/17 2045) Pulse Rate: 68 (02/17 2045)  Labs: Recent Labs    09/09/22 1356 09/09/22 1557 09/09/22 2034 09/10/22 0441 09/10/22 0651 09/11/22 0402 09/11/22 1348 09/11/22 2250  HGB 12.3*  --   --  11.9*  --  12.3*  --   --   HCT 37.9*  --   --  37.1*  --  37.2*  --   --   PLT 134*  --   --  125*  --  112*  --   --   APTT  --   --  38*  --    < > 130* 61* 79*  HEPARINUNFRC  --   --  >1.10*  --   --  0.59  --   --   CREATININE 1.83*  --   --  1.56*  --  1.28*  --   --   TROPONINIHS 49* 48*  --   --   --   --   --   --    < > = values in this interval not displayed.     Estimated Creatinine Clearance: 30 mL/min (A) (by C-G formula based on SCr of 1.28 mg/dL (H)).   Medical History: Past Medical History:  Diagnosis Date   A-fib [facility] [place])    a.) CHA2DS2-VASc = 4 (age x 2, CHF, aortic plaque). b.) s/p unsuccessful TEE cardioversion 04/28/2016 --> rec'd 200 J x1 and 360 J x 2. c.) rate/rhythm maintained on oral metoprolol succinante; chronically anticoagulated using dose reduced apixaban.   Anemia    Aortic atherosclerosis (HCC)    Arthritis    CHF (congestive heart failure) ([place])    a.) TTE 04/26/2016: EF >55%; mod LVH; MVP; mild-mod LAE, mild MR. b.) TTE 05/14/209: EF >55%; mod RVE; mild BAE; mod MR.TR; triv AR/PR. c.) TTE 03/28/2019: EF >55%; mild LVH; mod BAE; mild PR, mod MR/TR.   GERD (gastroesophageal reflux disease)    Heart murmur    History of kidney stones    HOH (hard of hearing)    a.) hearing aid to LEFT ear.   Hyperlipidemia    Infrarenal abdominal aortic aneurysm  (AAA) without rupture ([place]) 05/05/2021   a.) CT 05/05/2021 --> measured 3.0 cm.   MVP (mitral valve prolapse)    Peripheral edema    Pneumonia    Pulmonary HTN ([place]) 04/26/2016   a.) TTE 04/26/2016: EF >55%; PASP 45. b.) TTE 12/06/2017: EF >55%; RVSP 76.6 mmHg. c.) TTE 03/28/2019: EF >55%; RVSP 77.8 mmHg.   Renal cell carcinoma (HCC)    Rheumatic fever    Sepsis ([place])    Valvular heart disease     Medications:  PTA: apixaban 2.5 mg; last dose 2/15 AM   Assessment: 89 y.o. male  who presents to the emergency department today because of leg swelling and SOB. Patient with history of afib on apixaban. On CT found to have acute PE with evidence of right heart strain. Pharmacy has been consulted to initaite and manage IV heparin infusion.  Baseline: HL > 1.10, aPTT 38  2/16 0651 aPTT > 200 2/16 1717 aPTT 173 2/17 0402  aPTT 130,  HL 0.59 not correlating, decrease to 550 u/hr 2/17 1348 aPTT 61  Goal of Therapy:  Heparin level 0.3-0.7 units/ml aPTT 66-102 seconds Monitor platelets by anticoagulation protocol: Yes   Plan:  2/17: aPTT @ 2250 = 79, therapeutic X 1 Will continue pt on current rate and recheck HL and aPTT on 2/18 @ 0700. Will monitor via aPTT due to recent apixaban until HL and aPTT correlate Continue to monitor H&H and platelets  Torrie Namba D, PharmD Clinical Pharmacist   09/11/2022,11:25 PM

## 2022-09-11 NOTE — Consult Note (Signed)
ANTICOAGULATION CONSULT NOTE -   Pharmacy Consult for heparin infusion Indication: pulmonary embolus  Allergies  Allergen Reactions   Penicillins Anaphylaxis    Patient Measurements: Height: 5' 6"$  (167.6 cm) Weight: 61.5 kg (135 lb 9.3 oz) IBW/kg (Calculated) : 63.8 Heparin Dosing Weight: 69.4 kg  Vital Signs: Temp: 97.7 F (36.5 C) (02/17 1132) Temp Source: Oral (02/17 1132) BP: 90/62 (02/17 1132) Pulse Rate: 73 (02/17 1132)  Labs: Recent Labs    09/09/22 1356 09/09/22 1557 09/09/22 2034 09/10/22 0441 09/10/22 0651 09/10/22 1717 09/11/22 0402 09/11/22 1348  HGB 12.3*  --   --  11.9*  --   --  12.3*  --   HCT 37.9*  --   --  37.1*  --   --  37.2*  --   PLT 134*  --   --  125*  --   --  112*  --   APTT  --   --  38*  --    < > 173* 130* 61*  HEPARINUNFRC  --   --  >1.10*  --   --   --  0.59  --   CREATININE 1.83*  --   --  1.56*  --   --  1.28*  --   TROPONINIHS 49* 48*  --   --   --   --   --   --    < > = values in this interval not displayed.     Estimated Creatinine Clearance: 30 mL/min (A) (by C-G formula based on SCr of 1.28 mg/dL (H)).   Medical History: Past Medical History:  Diagnosis Date   A-fib [facility])    a.) CHA2DS2-VASc = 4 (age x 2, CHF, aortic plaque). b.) s/p unsuccessful TEE cardioversion 04/28/2016 --> rec'd 200 J x1 and 360 J x 2. c.) rate/rhythm maintained on oral metoprolol succinante; chronically anticoagulated using dose reduced apixaban.   Anemia    Aortic atherosclerosis (HCC)    Arthritis    CHF (congestive heart failure) ([place])    a.) TTE 04/26/2016: EF >55%; mod LVH; MVP; mild-mod LAE, mild MR. b.) TTE 05/14/209: EF >55%; mod RVE; mild BAE; mod MR.TR; triv AR/PR. c.) TTE 03/28/2019: EF >55%; mild LVH; mod BAE; mild PR, mod MR/TR.   GERD (gastroesophageal reflux disease)    Heart murmur    History of kidney stones    HOH (hard of hearing)    a.) hearing aid to LEFT ear.   Hyperlipidemia    Infrarenal abdominal aortic aneurysm  (AAA) without rupture ([place]) 05/05/2021   a.) CT 05/05/2021 --> measured 3.0 cm.   MVP (mitral valve prolapse)    Peripheral edema    Pneumonia    Pulmonary HTN ([place]) 04/26/2016   a.) TTE 04/26/2016: EF >55%; PASP 45. b.) TTE 12/06/2017: EF >55%; RVSP 76.6 mmHg. c.) TTE 03/28/2019: EF >55%; RVSP 77.8 mmHg.   Renal cell carcinoma (HCC)    Rheumatic fever    Sepsis ([place])    Valvular heart disease     Medications:  PTA: apixaban 2.5 mg; last dose 2/15 AM   Assessment: 89 y.o. male  who presents to the emergency department today because of leg swelling and SOB. Patient with history of afib on apixaban. On CT found to have acute PE with evidence of right heart strain. Pharmacy has been consulted to initaite and manage IV heparin infusion.  Baseline: HL > 1.10, aPTT 38  2/16 0651 aPTT > 200 2/16 1717 aPTT 173 2/17 0402  aPTT 130,  HL 0.59 not correlating, decrease to 550 u/hr 2/17 1348 aPTT 61  Goal of Therapy:  Heparin level 0.3-0.7 units/ml aPTT 66-102 seconds Monitor platelets by anticoagulation protocol: Yes   Plan:  2/17 1348 aPTT=61  subtherapeutic Will order bolus of 900 units and increase drip to 650 u/hr Check aPTT level 8 hours after rate change Will recheck HL on 2/18 with AM labs  Will monitor via aPTT due to recent apixaban until HL and aPTT correlate Continue to monitor H&H and platelets  Noralee Space, PharmD Clinical Pharmacist   09/11/2022,2:32 PM

## 2022-09-11 NOTE — Consult Note (Signed)
ANTICOAGULATION CONSULT NOTE - Initial Consult  Pharmacy Consult for heparin infusion Indication: pulmonary embolus  Allergies  Allergen Reactions   Penicillins Anaphylaxis    Patient Measurements: Height: 5' 6"$  (167.6 cm) Weight: 61.5 kg (135 lb 9.3 oz) IBW/kg (Calculated) : 63.8 Heparin Dosing Weight: 69.4 kg  Vital Signs: Temp: 98.2 F (36.8 C) (02/17 0444) Temp Source: Oral (02/17 0444) BP: 103/65 (02/17 0444) Pulse Rate: 72 (02/17 0444)  Labs: Recent Labs    09/09/22 1356 09/09/22 1356 09/09/22 1557 09/09/22 2034 09/10/22 0441 09/10/22 0651 09/10/22 1717 09/11/22 0402  HGB 12.3*  --   --   --  11.9*  --   --  12.3*  HCT 37.9*  --   --   --  37.1*  --   --  37.2*  PLT 134*  --   --   --  125*  --   --  112*  APTT  --    < >  --  38*  --  >200* 173* 130*  HEPARINUNFRC  --   --   --  >1.10*  --   --   --  0.59  CREATININE 1.83*  --   --   --  1.56*  --   --   --   TROPONINIHS 49*  --  48*  --   --   --   --   --    < > = values in this interval not displayed.     Estimated Creatinine Clearance: 24.6 mL/min (A) (by C-G formula based on SCr of 1.56 mg/dL (H)).   Medical History: Past Medical History:  Diagnosis Date   A-fib [facility])    a.) CHA2DS2-VASc = 4 (age x 2, CHF, aortic plaque). b.) s/p unsuccessful TEE cardioversion 04/28/2016 --> rec'd 200 J x1 and 360 J x 2. c.) rate/rhythm maintained on oral metoprolol succinante; chronically anticoagulated using dose reduced apixaban.   Anemia    Aortic atherosclerosis (HCC)    Arthritis    CHF (congestive heart failure) ([place])    a.) TTE 04/26/2016: EF >55%; mod LVH; MVP; mild-mod LAE, mild MR. b.) TTE 05/14/209: EF >55%; mod RVE; mild BAE; mod MR.TR; triv AR/PR. c.) TTE 03/28/2019: EF >55%; mild LVH; mod BAE; mild PR, mod MR/TR.   GERD (gastroesophageal reflux disease)    Heart murmur    History of kidney stones    HOH (hard of hearing)    a.) hearing aid to LEFT ear.   Hyperlipidemia    Infrarenal abdominal  aortic aneurysm (AAA) without rupture ([place]) 05/05/2021   a.) CT 05/05/2021 --> measured 3.0 cm.   MVP (mitral valve prolapse)    Peripheral edema    Pneumonia    Pulmonary HTN ([place]) 04/26/2016   a.) TTE 04/26/2016: EF >55%; PASP 45. b.) TTE 12/06/2017: EF >55%; RVSP 76.6 mmHg. c.) TTE 03/28/2019: EF >55%; RVSP 77.8 mmHg.   Renal cell carcinoma (HCC)    Rheumatic fever    Sepsis ([place])    Valvular heart disease     Medications:  PTA: apixaban 2.5 mg; last dose 2/15 AM   Assessment: 89 y.o. male  who presents to the emergency department today because of leg swelling and SOB. Patient with history of afib on apixaban. On CT found to have acute PE with evidence of right heart strain. Pharmacy has been consulted to initaite and manage IV heparin infusion.  Baseline: HL > 1.10, aPTT 38  2/16 0651 aPTT > 200 2/16 1717 aPTT  173 2/17 0402 aPTT 130,  HL 0.59  Goal of Therapy:  Heparin level 0.3-0.7 units/ml aPTT 66-102 seconds Monitor platelets by anticoagulation protocol: Yes   Plan:  2/17 @ 0402:  aPTT = 130, HL = 0.59 aPTT elevated, not correlating with HL Will hold heparin infusion for 1 hr and restart @ 550 units/hr Check aPTT level 8 hours after rate change Will recheck HL on 2/18 with AM labs  Will monitor via aPTT due to recent apixaban until HL and aPTT correlate Continue to monitor H&H and platelets  Amelio Brosky D, PharmD Clinical Pharmacist   09/11/2022,5:10 AM

## 2022-09-12 DIAGNOSIS — I2609 Other pulmonary embolism with acute cor pulmonale: Secondary | ICD-10-CM | POA: Diagnosis not present

## 2022-09-12 LAB — PHOSPHORUS: Phosphorus: 2.7 mg/dL (ref 2.5–4.6)

## 2022-09-12 LAB — BASIC METABOLIC PANEL
Anion gap: 7 (ref 5–15)
BUN: 29 mg/dL — ABNORMAL HIGH (ref 8–23)
CO2: 31 mmol/L (ref 22–32)
Calcium: 8.2 mg/dL — ABNORMAL LOW (ref 8.9–10.3)
Chloride: 98 mmol/L (ref 98–111)
Creatinine, Ser: 1.07 mg/dL (ref 0.61–1.24)
GFR, Estimated: >60 mL/min (ref >60)
Glucose, Bld: 89 mg/dL (ref 70–99)
Potassium: 3.2 mmol/L — ABNORMAL LOW (ref 3.5–5.1)
Sodium: 136 mmol/L (ref 135–145)

## 2022-09-12 LAB — CBC
HCT: 36.2 % — ABNORMAL LOW (ref 39.0–52.0)
Hemoglobin: 12 g/dL — ABNORMAL LOW (ref 13.0–17.0)
MCH: 34.2 pg — ABNORMAL HIGH (ref 26.0–34.0)
MCHC: 33.1 g/dL (ref 30.0–36.0)
MCV: 103.1 fL — ABNORMAL HIGH (ref 80.0–100.0)
Platelets: 105 10*3/uL — ABNORMAL LOW (ref 150–400)
RBC: 3.51 MIL/uL — ABNORMAL LOW (ref 4.22–5.81)
RDW: 17.4 % — ABNORMAL HIGH (ref 11.5–15.5)
WBC: 5.2 10*3/uL (ref 4.0–10.5)
nRBC: 0 % (ref 0.0–0.2)

## 2022-09-12 LAB — APTT: aPTT: 92 seconds — ABNORMAL HIGH (ref 24–36)

## 2022-09-12 LAB — MAGNESIUM: Magnesium: 1.7 mg/dL (ref 1.7–2.4)

## 2022-09-12 LAB — HEPARIN LEVEL (UNFRACTIONATED): Heparin Unfractionated: 0.28 IU/mL — ABNORMAL LOW (ref 0.30–0.70)

## 2022-09-12 MED ORDER — ENSURE ENLIVE PO LIQD
237.0000 mL | Freq: Three times a day (TID) | ORAL | Status: DC
  Administered 2022-09-13 – 2022-09-15 (×6): 237 mL via ORAL

## 2022-09-12 MED ORDER — POTASSIUM CHLORIDE CRYS ER 20 MEQ PO TBCR
40.0000 meq | EXTENDED_RELEASE_TABLET | Freq: Three times a day (TID) | ORAL | Status: AC
  Administered 2022-09-12 (×2): 40 meq via ORAL
  Filled 2022-09-12 (×2): qty 2

## 2022-09-12 NOTE — Consult Note (Signed)
Dropped off AD paperwork for pt. Wife at bedside. Asked we come back in the morning to help complete. Chaplain will leave not for follow up.

## 2022-09-12 NOTE — Consult Note (Signed)
ANTICOAGULATION CONSULT NOTE -   Pharmacy Consult for heparin infusion Indication: pulmonary embolus  Allergies  Allergen Reactions   Penicillins Anaphylaxis    Patient Measurements: Height: 5' 6"$  (167.6 cm) Weight: 61.5 kg (135 lb 9.3 oz) IBW/kg (Calculated) : 63.8 Heparin Dosing Weight: 69.4 kg  Vital Signs: Temp: 97.6 F (36.4 C) (02/18 0747) Temp Source: Oral (02/17 2045) BP: 107/69 (02/18 0747) Pulse Rate: 70 (02/18 0747)  Labs: Recent Labs    09/09/22 1356 09/09/22 1557 09/09/22 2034 09/10/22 0441 09/10/22 0651 09/11/22 0402 09/11/22 1348 09/11/22 2250 09/12/22 0729  HGB 12.3*  --   --  11.9*  --  12.3*  --   --   --   HCT 37.9*  --   --  37.1*  --  37.2*  --   --   --   PLT 134*  --   --  125*  --  112*  --   --   --   APTT  --   --  38*  --    < > 130* 61* 79* 92*  HEPARINUNFRC  --   --  >1.10*  --   --  0.59  --   --  0.28*  CREATININE 1.83*  --   --  1.56*  --  1.28*  --   --  1.07  TROPONINIHS 49* 48*  --   --   --   --   --   --   --    < > = values in this interval not displayed.     Estimated Creatinine Clearance: 35.9 mL/min (by C-G formula based on SCr of 1.07 mg/dL).   Medical History: Past Medical History:  Diagnosis Date   A-fib [facility])    a.) CHA2DS2-VASc = 4 (age x 2, CHF, aortic plaque). b.) s/p unsuccessful TEE cardioversion 04/28/2016 --> rec'd 200 J x1 and 360 J x 2. c.) rate/rhythm maintained on oral metoprolol succinante; chronically anticoagulated using dose reduced apixaban.   Anemia    Aortic atherosclerosis (HCC)    Arthritis    CHF (congestive heart failure) ([place])    a.) TTE 04/26/2016: EF >55%; mod LVH; MVP; mild-mod LAE, mild MR. b.) TTE 05/14/209: EF >55%; mod RVE; mild BAE; mod MR.TR; triv AR/PR. c.) TTE 03/28/2019: EF >55%; mild LVH; mod BAE; mild PR, mod MR/TR.   GERD (gastroesophageal reflux disease)    Heart murmur    History of kidney stones    HOH (hard of hearing)    a.) hearing aid to LEFT ear.   Hyperlipidemia     Infrarenal abdominal aortic aneurysm (AAA) without rupture ([place]) 05/05/2021   a.) CT 05/05/2021 --> measured 3.0 cm.   MVP (mitral valve prolapse)    Peripheral edema    Pneumonia    Pulmonary HTN ([place]) 04/26/2016   a.) TTE 04/26/2016: EF >55%; PASP 45. b.) TTE 12/06/2017: EF >55%; RVSP 76.6 mmHg. c.) TTE 03/28/2019: EF >55%; RVSP 77.8 mmHg.   Renal cell carcinoma (HCC)    Rheumatic fever    Sepsis ([place])    Valvular heart disease     Medications:  PTA: apixaban 2.5 mg; last dose 2/15 AM   Assessment: 89 y.o. male  who presents to the emergency department today because of leg swelling and SOB. Patient with history of afib on apixaban. On CT found to have acute PE with evidence of right heart strain. Pharmacy has been consulted to initaite and manage IV heparin infusion.  Baseline: HL >  1.10, aPTT 38  2/16 0651 aPTT > 200 2/16 1717 aPTT 173 2/17 0402 aPTT 130,  HL 0.59 not correlating, decrease to 550 u/hr 2/17 1348 aPTT 61  subtherapeutic- chg to 650 u/hr 2/17 2250 aPTT 79 therapeuticx1 2/18 0729 aPTT 92, (HL 0.28)  Therapeutic x 2, not correlating, cont at 650 u/hr   Goal of Therapy:  Heparin level 0.3-0.7 units/ml aPTT 66-102 seconds Monitor platelets by anticoagulation protocol: Yes   Plan:  2/18 0729 aPTT 92,( HL 0.28)   Therapeutic x 2 aPTT/HL still not correlating Will continue pt on current rate and recheck HL and aPTT w/ am labs. Will monitor via aPTT due to recent apixaban until HL and aPTT correlate Continue to monitor H&H and platelets  Noralee Space, PharmD Clinical Pharmacist   09/12/2022,7:54 AM

## 2022-09-12 NOTE — Progress Notes (Signed)
[facility] Progress Note  Patient: Marcus Kemp    [id number]  DOA: 09/09/2022     Date of Service: the patient was seen and examined on 09/12/2022  Chief Complaint  Patient presents with   Leg Swelling   Brief hospital course: Marcus Kemp is a 89 y.o. 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  Patient has sensorineural deafness.  Denies any chest pain, no palpitations, no abdominal pain.   ED Course:  creatinine 1.83, AKI, BNP 1637 very elevated, troponin 49--48 most likely due to demand ischemia, hemoglobin 12.3, elevated MCV.  CXR consistent with pulmonary edema. Patient was given Lasix IV in the ED and [facility] hospitalist consulted for further management   Assessment and Plan: Principal Problem: Acute pulmonary embolism  Active problems: CHF (congestive heart failure) ([place]) AKI   # Acute hypoxic respiratory failure 2/2 acute PE, pul. edema, and dCHF exacerbation Continue supplemental O2 admission gradually wean off Patient was cyanotic on exam, patient was placed on nonrebreather ABG consistent with hypoxia, Critical care was consulted, pt did not require any intervention D-dimer elevated, CTA positive for PE TTE LVEF 50 to 60% no WMA, mod LV hypertrophy, mod-severe MR and TR  Cardiology consulted for further recommendation   # Acute pulmonary embolism D-dimer elevated,  CTA positive for PE with right heart strain RV:LV 1.8, Cardiac enlargement with bilateral pleural effusions and probable interstitial edema. Probable hepatic cirrhosis with upper abdominal ascites. 6 mm nonobstructing stones seen in the right kidney. TTE LVEF 55 to 60%, LV hypertrophy, severe pulmonary hypertension, mitral valve regurgitation and  tricuspid valve regurgitation moderate to severe. Venous duplex b/l lower extremity negative for DVT Continue IV heparin infusion Vascular surgery consulted, recommended continue IV heparin infusion, optimize CHF and may need to repeat CTA for clot burden to decide for thrombectomy. 2/18 Vascular surgery recommended cardiology consult for complete cardiac workup    # Acute on chronic diastolic CHF exacerbation Presented with pulmonary edema and bilateral lower extremity edema S/p Lasix IV 60 mg x 1 dose given in the ED S/p Lasix IV infusion DC'd on 2/17, started Lasix 20 mg IV twice daily  2/17 BP was soft so started midodrine 5 mg p.o. 3 times daily Troponin slightly elevated most likely demand ischemia, patient denies any chest pain TTE as above Continue to monitor BP and titrate medication accordingly    # Hypokalemia, potassium repleted. Monitor electrolytes and replete as needed.   # AKI, possible cardiorenal syndrome Monitor urine output and renal functions daily Insert Foley catheter for close urine output monitoring Avoid nephrotoxic medications, use renally dose medications US renal: No collecting system dilatation.  Bilateral renal cysts. Ascites. Cr 1.83--1.56--1.28 --1.07, AKI resolved   # Hypertension, A- flutter s/p ablation Held Lopressor due to low blood pressure Held Eliquis 2.5 mg p.o. twice daily home dose Started heparin IV infusion due to acute PE 2/17 started midodrine 5 mg p.o. 3 times daily with holding parameters due to low BP Monitor BP and titrate medications accordingly Continue to monitor on telemetry   # Vision loss in the left eye, most likely due to cataract Patient is poor historian, patient wife mentioned that patient is having gradual decrease in the vision in the left eye possible due to cataract.  She called ophthalmology appointment which is after few weeks.  No any new neurological changes, no any acute vision loss, less likely stroke.  We  will continue to monitor.   Overall poor prognosis due to advanced age, palliative care consulted for goals of care discussion with family.  Currently patient is full code.  Body mass index is 24.69 kg/m.  Interventions:     Diet: Heart healthy diet, fluid striction 1.5 L/day DVT Prophylaxis: Therapeutic Anticoagulation with heparin IV infusion    Advance goals of care discussion: Full code  Family Communication: family was present at bedside, at the time of interview.  The pt provided permission to discuss medical plan with the family. Opportunity was given to ask question and all questions were answered satisfactorily.   Disposition:  Pt is from Home, admitted with acute pulmonary embolism, respiratory failure, diastolic CHF, still has PE on IV heparin infusion, which precludes a safe discharge. Discharge to TBD, needs PT and OT eval, when clinically stable may need few days to improve.  Subjective: No significant events overnight, patient slept well last night, stated that he was on cloud 9 at night, but in the morning time he was again feeling depressed.  Denied any worsening of shortness of breath, no chest pain or palpitation, no any other active issues.   Physical Exam: General: NAD, lying comfortably Appear in no distress, affect appropriate Eyes: PERRLA, very poor vision in the left eye most likely due to cataract ENT: Oral Mucosa Clear, moist, b/l SNHL Neck: no JVD,  Cardiovascular: S1 and S2 Present, no Murmur,  Respiratory: good respiratory effort, Bilateral Air entry equal, mild bibasilar crackles, no wheezes  Abdomen: Bowel Sound present, Soft and no tenderness,  Skin: no rashes Extremities: No calf tenderness.  Edema resolved Neurologic: without any new focal findings Gait not checked due to patient safety concerns  Vitals:   09/11/22 2045 09/12/22 0024 09/12/22 0747 09/12/22 1217  BP: 111/67 113/72 107/69 (!) 102/58  Pulse: 68 76 70 68  Resp: 16 16 17    $ Temp: 97.9 F (36.6 C) 98 F (36.7 C) 97.6 F (36.4 C) 97.6 F (36.4 C)  TempSrc: Oral     SpO2: 100% 97% 100% 98%  Weight:      Height:        Intake/Output Summary (Last 24 hours) at 09/12/2022 1233 Last data filed at 09/12/2022 0551 Gross per 24 hour  Intake 153 ml  Output 2175 ml  Net -2022 ml   Filed Weights   09/09/22 1352 09/10/22 2224  Weight: 69.4 kg 61.5 kg    Data Reviewed: I have personally reviewed and interpreted daily labs, tele strips, imagings as discussed above. I reviewed all nursing notes, pharmacy notes, vitals, pertinent old records I have discussed plan of care as described above with RN and patient/family.  CBC: Recent Labs  Lab 09/09/22 1356 09/10/22 0441 09/11/22 0402 09/12/22 0729  WBC 5.1 5.6 5.8 5.2  HGB 12.3* 11.9* 12.3* 12.0*  HCT 37.9* 37.1* 37.2* 36.2*  MCV 104.7* 105.4* 103.6* 103.1*  PLT 134* 125* 112* [id number]*   Basic Metabolic Panel: Recent Labs  Lab 09/09/22 1356 09/10/22 0441 09/11/22 0402 09/12/22 0729  NA 136 133* 137 136  K 4.7 4.3 3.6 3.2*  CL 102 98 98 98  CO2 26 26 32 31  GLUCOSE 152* 91 84 89  BUN 52* 48* 37* 29*  CREATININE 1.83* 1.56* 1.28* 1.07  CALCIUM 8.7* 8.3* 8.8* 8.2*  MG  --  2.2  1.9 1.7  PHOS  --  4.6 3.1 2.7    Studies: No results found.  Scheduled Meds:  furosemide  20 mg Intravenous BID   loratadine  10 mg Oral Daily   midodrine  5 mg Oral TID WC   potassium chloride  40 mEq Oral TID   sodium chloride flush  3 mL Intravenous Q12H   Continuous Infusions:  sodium chloride     heparin 650 Units/hr (09/11/22 1524)   PRN Meds: sodium chloride, acetaminophen **OR** acetaminophen, sodium chloride flush  Time spent: 50 minutes  Author: Val Riles. MD [facility] 09/12/2022 12:33 PM  To reach On-call, see care teams to locate the attending and reach out to them via [email or web address]. If 7PM-7AM, please contact night-coverage If you still have difficulty reaching the attending provider,  please page the [facility] (Director on Call) for [facility] on amion for assistance.

## 2022-09-12 NOTE — Consult Note (Signed)
CARDIOLOGY CONSULT NOTE               Patient ID: Marcus Kemp MRN: [id number] DOB/AGE: 87-Jun-1926 87 y.o.  Admit date: 09/09/2022 Referring Physician Dr. Val Riles hospitalist Primary Physician Dr. Fulton Reek primary Primary Cardiologist [facility] clinic cardiology Reason for Consultation shortness of breath lower extremity edema acute pulm embolus Clearance for vascular procedure  HPI: Patient is a 89 year old male past history of grade 2 diastolic dysfunction mitral valvular disease with prolapse history of hypertension atrial flutter status post ablation.  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  To proceed with intervention because of RV strain and needed cardiac clearance the patient denies any chest pain shortness of breath is improved denies any palpitations or tachycardia he is only had some recent urinary symptoms  Review of systems complete and found to be negative unless listed above     Past Medical History:  Diagnosis Date   A-fib ([place])    a.) CHA2DS2-VASc = 4 (age x 2, CHF, aortic plaque). b.) s/p unsuccessful TEE cardioversion 04/28/2016 --> rec'd 200 J x1 and 360 J x 2. c.) rate/rhythm maintained on oral metoprolol succinante; chronically anticoagulated using dose reduced apixaban.   Anemia    Aortic atherosclerosis (HCC)    Arthritis    CHF (congestive heart failure) ([place])    a.) TTE 04/26/2016: EF >55%; mod LVH; MVP; mild-mod LAE, mild MR. b.) TTE 05/14/209: EF >55%; mod RVE; mild BAE; mod MR.TR; triv AR/PR. c.) TTE 03/28/2019: EF >55%; mild LVH; mod BAE; mild PR, mod MR/TR.   GERD (gastroesophageal reflux disease)    Heart murmur    History of kidney stones    HOH (hard of hearing)    a.) hearing aid to LEFT ear.   Hyperlipidemia    Infrarenal abdominal aortic  aneurysm (AAA) without rupture ([place]) 05/05/2021   a.) CT 05/05/2021 --> measured 3.0 cm.   MVP (mitral valve prolapse)    Peripheral edema    Pneumonia    Pulmonary HTN ([place]) 04/26/2016   a.) TTE 04/26/2016: EF >55%; PASP 45. b.) TTE 12/06/2017: EF >55%; RVSP 76.6 mmHg. c.) TTE 03/28/2019: EF >55%; RVSP 77.8 mmHg.   Renal cell carcinoma (HCC)    Rheumatic fever    Sepsis ([place])    Valvular heart disease     Past Surgical History:  Procedure Laterality Date   INGUINAL HERNIA REPAIR Left 01/13/2022   Procedure: HERNIA REPAIR INGUINAL ADULT;  Surgeon: Robert Bellow, MD;  Location: [facility] ORS;  Service: General;  Laterality: Left;   INSERTION OF MESH  01/13/2022   Procedure: INSERTION OF MESH;  Surgeon: Robert Bellow, MD;  Location: [facility] ORS;  Service: General;;   LITHOTRIPSY     TEE WITH CARDIOVERSION N/A    P   TONSILLECTOMY      Medications Prior to Admission  Medication Sig Dispense Refill Last Dose   acetaminophen (TYLENOL) 500 MG tablet Take 1,000 mg by mouth at bedtime.   unknown   apixaban (ELIQUIS) 2.5 MG TABS tablet Take 1 tablet (2.5 mg total) by mouth 2 (two) times daily. 60 tablet 0 09/09/2022   Azelastine HCl 137 MCG/SPRAY SOLN SMARTSIG:2 Puff(s) Both Nares Twice Daily   unknown   cetirizine (ZYRTEC) 10 MG tablet SMARTSIG:1 pill By Mouth Daily PRN   unknown   hydrOXYzine (  VISTARIL) 25 MG capsule Take 25-50 mg by mouth at bedtime.   09/08/2022   metoprolol succinate (TOPROL-XL) 25 MG 24 hr tablet Take 25 mg by mouth every morning.   09/09/2022   mirtazapine (REMERON) 7.5 MG tablet Take 7.5 mg by mouth at bedtime.   09/08/2022   vitamin B-12 (CYANOCOBALAMIN) 500 MCG tablet Take 1 tablet (500 mcg total) by mouth daily. 90 tablet 3 09/09/2022   furosemide (LASIX) 20 MG tablet Take 2 tablets (40 mg total) by mouth every morning. 60 tablet 0    OVER THE COUNTER MEDICATION Take 2 tablets by mouth at bedtime. Seratame (Patient not taking: Reported on 09/09/2022)   Not Taking    OVER THE COUNTER MEDICATION Take 1 tablet by mouth at bedtime. Relaxium (Patient not taking: Reported on 09/09/2022)   Not Taking   Social History   Socioeconomic History   Marital status: Married    Spouse name: Not on file   Number of children: Not on file   Years of education: Not on file   Highest education level: Not on file  Occupational History   Not on file  Tobacco Use   Smoking status: Former    Packs/day: 0.25    Years: 8.00    Total pack years: 2.00    Types: Cigarettes    Quit date: 44    Years since quitting: 84.1   Smokeless tobacco: Never  Vaping Use   Vaping Use: Never used  Substance and Sexual Activity   Alcohol use: No   Drug use: Never   Sexual activity: Not on file  Other Topics Concern   Not on file  Social History Narrative   Not on file   Social Determinants of Health   Financial Resource Strain: Not on file  Food Insecurity: No Food Insecurity (09/10/2022)   Hunger Vital Sign    Worried About Running Out of Food in the Last Year: Never true    Ran Out of Food in the Last Year: Never true  Transportation Needs: No Transportation Needs (09/10/2022)   PRAPARE - Hydrologist (Medical): No    Lack of Transportation (Non-Medical): No  Physical Activity: Not on file  Stress: Not on file  Social Connections: Not on file  Intimate Partner Violence: Not At Risk (09/10/2022)   Humiliation, Afraid, Rape, and Kick questionnaire    Fear of Current or Ex-Partner: No    Emotionally Abused: No    Physically Abused: No    Sexually Abused: No    Family History  Problem Relation Age of Onset   Heart disease Sister    Hypertension Other       Review of systems complete and found to be negative unless listed above      PHYSICAL EXAM  General: Well developed, well nourished, in no acute distress HEENT:  Normocephalic and atramatic Neck:  No JVD.  Lungs: Clear bilaterally to auscultation and percussion. Heart: HRRR .  Normal S1 and S2 without gallops or murmurs.  Abdomen: Bowel sounds are positive, abdomen soft and non-tender  Msk:  Back normal, normal gait. Normal strength and tone for age. Extremities: No clubbing, cyanosis or edema.   Neuro: Alert and oriented X 3. Psych:  Good affect, responds appropriately  Labs:   Lab Results  Component Value Date   WBC 5.2 09/12/2022   HGB 12.0 (L) 09/12/2022   HCT 36.2 (L) 09/12/2022   MCV 103.1 (H) 09/12/2022   PLT 105 (L)  09/12/2022    Recent Labs  Lab 09/12/22 0729  NA 136  K 3.2*  CL 98  CO2 31  BUN 29*  CREATININE 1.07  CALCIUM 8.2*  GLUCOSE 89   Lab Results  Component Value Date   TROPONINI <0.03 05/10/2016   No results found for: "CHOL" No results found for: "HDL" No results found for: "LDLCALC" No results found for: "TRIG" No results found for: "CHOLHDL" No results found for: "LDLDIRECT"    Radiology: ECHOCARDIOGRAM COMPLETE  Result Date: 09/10/2022    ECHOCARDIOGRAM REPORT   Patient Name:   ZEIK LOEB Date of Exam: 09/10/2022 Medical Rec #:  [id number]         Height:       66.0 in Accession #:    [id number]        Weight:       153.0 lb Date of Birth:  March 21, 1925        BSA:          1.785 m Patient Age:    89 years          BP:           108/67 mmHg Patient Gender: M                 HR:           62 bpm. Exam Location:  [facility] Procedure: 2D Echo, Cardiac Doppler and Color Doppler Indications:     CHF  History:         Patient has prior history of Echocardiogram examinations, most                  recent 01/28/2022. CHF, Arrythmias:Atrial Fibrillation,                  Signs/Symptoms:Edema; Risk Factors:Dyslipidemia. Pulmonary                  embolus, Rheumatic fever age 61.  Sonographer:     Wenda Low Referring Phys:  [id number] Val Riles Diagnosing Phys: Ida Rogue MD IMPRESSIONS  1. Left ventricular ejection fraction, by estimation, is 55 to 60%. The left ventricle has normal function. The left ventricle has no regional  wall motion abnormalities. There is moderate left ventricular hypertrophy. Left ventricular diastolic parameters are indeterminate. There is the interventricular septum is flattened in systole and diastole, consistent with right ventricular pressure and volume overload.  2. Right ventricular systolic function is mildly reduced. The right ventricular size is moderately enlarged. There is severely elevated pulmonary artery systolic pressure. The estimated right ventricular systolic pressure is [id number] mmHg.  3. Left atrial size was moderately dilated.  4. Right atrial size was mildly dilated.  5. The mitral valve is normal in structure. Moderate to severe mitral valve regurgitation. No evidence of mitral stenosis.  6. Tricuspid valve regurgitation is moderate to severe.  7. The aortic valve has an indeterminant number of cusps. Aortic valve regurgitation is not visualized. Aortic valve sclerosis/calcification is present, without any evidence of aortic stenosis.  8. There is borderline dilatation of the ascending aorta, measuring 36 mm.  9. The inferior vena cava is dilated in size with <50% respiratory variability, suggesting right atrial pressure of 15 mmHg. FINDINGS  Left Ventricle: Left ventricular ejection fraction, by estimation, is 55 to 60%. The left ventricle has normal function. The left ventricle has no regional wall motion abnormalities. The left ventricular internal cavity size was normal in size. There is  moderate left  ventricular hypertrophy. The interventricular septum is flattened in systole and diastole, consistent with right ventricular pressure and volume overload. Left ventricular diastolic parameters are indeterminate. Right Ventricle: The right ventricular size is moderately enlarged. No increase in right ventricular wall thickness. Right ventricular systolic function is mildly reduced. There is severely elevated pulmonary artery systolic pressure. The tricuspid regurgitant velocity is 3.72 m/s,  and with an assumed right atrial pressure of 15 mmHg, the estimated right ventricular systolic pressure is [id number] mmHg. Left Atrium: Left atrial size was moderately dilated. Right Atrium: Right atrial size was mildly dilated. Pericardium: There is no evidence of pericardial effusion. Mitral Valve: The mitral valve is normal in structure. There is moderate thickening of the mitral valve leaflet(s). There is moderate calcification of the mitral valve leaflet(s). Moderate to severe mitral valve regurgitation, with posteriorly-directed jet. No evidence of mitral valve stenosis. MV peak gradient, 40.4 mmHg. The mean mitral valve gradient is 25.5 mmHg. Tricuspid Valve: The tricuspid valve is normal in structure. Tricuspid valve regurgitation is moderate to severe. No evidence of tricuspid stenosis. Aortic Valve: The aortic valve has an indeterminant number of cusps. Aortic valve regurgitation is not visualized. Aortic valve sclerosis/calcification is present, without any evidence of aortic stenosis. Aortic valve mean gradient measures 4.0 mmHg. Aortic valve peak gradient measures 7.3 mmHg. Aortic valve area, by VTI measures 2.23 cm. Pulmonic Valve: The pulmonic valve was normal in structure. Pulmonic valve regurgitation is mild. No evidence of pulmonic stenosis. Aorta: The aortic root is normal in size and structure. There is borderline dilatation of the ascending aorta, measuring 36 mm. Venous: The inferior vena cava is dilated in size with less than 50% respiratory variability, suggesting right atrial pressure of 15 mmHg. IAS/Shunts: No atrial level shunt detected by color flow Doppler.  LEFT VENTRICLE PLAX 2D LVIDd:         3.70 cm LVIDs:         2.50 cm LV PW:         1.10 cm LV IVS:        1.30 cm LVOT diam:     2.00 cm LV SV:         60 LV SV Index:   34 LVOT Area:     3.14 cm  RIGHT VENTRICLE RV Basal diam:  4.80 cm RV Mid diam:    4.00 cm RV S prime:     9.03 cm/s LEFT ATRIUM             Index        RIGHT ATRIUM            Index LA diam:        4.40 cm 2.47 cm/m   RA Area:     22.90 cm LA Vol (A2C):   97.4 ml 54.58 ml/m  RA Volume:   68.80 ml  38.55 ml/m LA Vol (A4C):   87.2 ml 48.86 ml/m LA Biplane Vol: 95.4 ml 53.46 ml/m  AORTIC VALVE                    PULMONIC VALVE AV Area (Vmax):    2.30 cm     PV Vmax:       0.91 m/s AV Area (Vmean):   2.18 cm     PV Peak grad:  3.3 mmHg AV Area (VTI):     2.23 cm AV Vmax:           135.00 cm/s AV Vmean:  89.600 cm/s AV VTI:            0.270 m AV Peak Grad:      7.3 mmHg AV Mean Grad:      4.0 mmHg LVOT Vmax:         99.00 cm/s LVOT Vmean:        62.100 cm/s LVOT VTI:          0.192 m LVOT/AV VTI ratio: 0.71  AORTA Ao Root diam: 3.40 cm Ao Asc diam:  3.60 cm MITRAL VALVE                  TRICUSPID VALVE MV Area (PHT): 4.29 cm       TR Peak grad:   55.4 mmHg MV Area VTI:   0.66 cm       TR Vmax:        372.00 cm/s MV Peak grad:  40.4 mmHg MV Mean grad:  25.5 mmHg      SHUNTS MV Vmax:       3.18 m/s       Systemic VTI:  0.19 m MV Vmean:      195.7 cm/s     Systemic Diam: 2.00 cm MV Decel Time: 177 msec MR Peak grad:    72.6 mmHg MR Mean grad:    49.0 mmHg MR Vmax:         426.00 cm/s MR Vmean:        327.0 cm/s MR PISA:         4.02 cm MR PISA Eff ROA: 80 mm MR PISA Radius:  0.80 cm MV E velocity: 155.00 cm/s Ida Rogue MD Electronically signed by Ida Rogue MD Signature Date/Time: 09/10/2022/5:07:49 PM    Final    US Venous Img Lower Bilateral (DVT)  Result Date: 09/10/2022 CLINICAL DATA:  Pulmonary embolism. EXAM: BILATERAL LOWER EXTREMITY VENOUS DOPPLER ULTRASOUND TECHNIQUE: Gray-scale sonography with graded compression, as well as color Doppler and duplex ultrasound were performed to evaluate the lower extremity deep venous systems from the level of the common femoral vein and including the common femoral, femoral, profunda femoral, popliteal and calf veins including the posterior tibial, peroneal and gastrocnemius veins when visible. The superficial  great saphenous vein was also interrogated. Spectral Doppler was utilized to evaluate flow at rest and with distal augmentation maneuvers in the common femoral, femoral and popliteal veins. COMPARISON:  None Available. FINDINGS: RIGHT LOWER EXTREMITY Common Femoral Vein: No evidence of thrombus. Normal compressibility, respiratory phasicity and response to augmentation. Saphenofemoral Junction: No evidence of thrombus. Normal compressibility and flow on color Doppler imaging. Profunda Femoral Vein: No evidence of thrombus. Normal compressibility and flow on color Doppler imaging. Femoral Vein: No evidence of thrombus. Normal compressibility, respiratory phasicity and response to augmentation. Popliteal Vein: No evidence of thrombus. Normal compressibility, respiratory phasicity and response to augmentation. Calf Veins: No evidence of thrombus. Normal compressibility and flow on color Doppler imaging. Superficial Great Saphenous Vein: No evidence of thrombus. Normal compressibility. Venous Reflux:  None. Other Findings:  None. LEFT LOWER EXTREMITY Common Femoral Vein: No evidence of thrombus. Normal compressibility, respiratory phasicity and response to augmentation. Saphenofemoral Junction: No evidence of thrombus. Normal compressibility and flow on color Doppler imaging. Profunda Femoral Vein: No evidence of thrombus. Normal compressibility and flow on color Doppler imaging. Femoral Vein: No evidence of thrombus. Normal compressibility, respiratory phasicity and response to augmentation. Popliteal Vein: No evidence of thrombus. Normal compressibility, respiratory phasicity and response to augmentation. Calf Veins: No evidence  of thrombus. Normal compressibility and flow on color Doppler imaging. Superficial Great Saphenous Vein: No evidence of thrombus. Normal compressibility. Venous Reflux:  None. Other Findings:  None. IMPRESSION: No evidence of deep venous thrombosis in either lower extremity. Electronically  Signed   By: Valetta Mole M.D.   On: 09/10/2022 11:44   CT Angio Chest Pulmonary Embolism (PE) W or WO Contrast  Result Date: 09/09/2022 CLINICAL DATA:  Pulmonary embolus suspected with high probability. Bilateral leg swelling and fluid buildup. Some shortness of breath. EXAM: CT ANGIOGRAPHY CHEST WITH CONTRAST TECHNIQUE: Multidetector CT imaging of the chest was performed using the standard protocol during bolus administration of intravenous contrast. Multiplanar CT image reconstructions and MIPs were obtained to evaluate the vascular anatomy. RADIATION DOSE REDUCTION: This exam was performed according to the departmental dose-optimization program which includes automated exposure control, adjustment of the mA and/or kV according to patient size and/or use of iterative reconstruction technique. CONTRAST:  31m OMNIPAQUE IOHEXOL 350 MG/ML SOLN COMPARISON:  06/26/2021 FINDINGS: Cardiovascular: There is good opacification of the central and segmental pulmonary arteries. Examination is technically adequate. Filling defects are demonstrated in multiple bilateral lower lobe segmental and subsegmental pulmonary arteries consistent with acute pulmonary embolus. The RV to LV ratio is elevated at 1.8. This may indicate evidence of right heart strain although the clot burden is relatively low and changes could also be related to underlying heart failure. Clinical correlation suggested. Diffuse cardiac enlargement. Normal caliber thoracic aorta. Calcification of the aorta and coronary arteries. Mediastinum/Nodes: Thyroid gland is unremarkable. Esophagus is decompressed. No significant lymphadenopathy. Lungs/Pleura: Small bilateral pleural effusions with basilar atelectasis. Peripheral interstitial changes in the lungs likely represents early pulmonary edema. No pneumothorax. Upper Abdomen: Probable hepatic cirrhosis with nodular liver contour and enlarged lateral segment left and caudate lobes of the liver. Gallbladder  wall is thickened, likely related to liver disease. Moderate ascites in the upper abdomen. 6 mm stone in the right kidney. No hydronephrosis or hydroureter. Musculoskeletal: Degenerative changes in the spine. Old bilateral rib fractures. Review of the MIP images confirms the above findings. IMPRESSION: 1. Positive examination for acute pulmonary embolus involving bilateral lower lobe pulmonary arteries. Elevated RV to LV ratio of 1.8 may indicate right heart strain. 2. Cardiac enlargement with bilateral pleural effusions and probable interstitial edema. 3. Probable hepatic cirrhosis with upper abdominal ascites. 4. 6 mm nonobstructing stones seen in the right kidney. 5. Aortic atherosclerosis. Critical Value/emergent results were called by telephone at the time of interpretation on 09/09/2022 at 9:02 pm [facility] verbally acknowledged these results. Electronically Signed   By: WLucienne CapersM.D.   On: 09/09/2022 21:07   [place]  Result Date: 09/09/2022 CLINICAL DATA:  Acute kidney injury EXAM: RENAL / URINARY TRACT ULTRASOUND COMPLETE COMPARISON:  CT 12/24/2021 FINDINGS: Right Kidney: Renal measurements: 9.9 x 5.3 x 5.2 = volume: 142 mL. Echogenicity within normal limits. No mass or hydronephrosis visualized. Central presumed parapelvic renal cyst as seen on prior CT scan measuring 19 mm. No collecting system dilatation or perinephric fluid. Left Kidney: Renal measurements: 9.9 x 4.4 x 4.5 = volume: 103 mL. There are some near anechoic foci in the left kidney with through transmission measuring 2.2 x 1.9 x 1.8 cm and 2.1 by 2.2 x 1.8 cm. On prior CT scan these are also felt to be cysts. There is however a lesion in the posterior aspect of the left kidney on the prior measuring 19 mm which was a solid lesion.  This is not clearly seen on today's examination. Bladder: Appears normal for degree of bladder distention. Other: Scattered ascites IMPRESSION: No collecting system dilatation.   Bilateral renal cysts. Ascites. Prior CT scan described enhancing mass in the left kidney. This is not clearly seen on this examination today. On the prior study this is felt to be an aggressive lesion or potentially neoplasm. Please correlate with known history and workup Electronically Signed   By: Jill Side M.D.   On: 09/09/2022 19:32   DG Chest 2 View  Result Date: 09/09/2022 CLINICAL DATA:  Chest pain. EXAM: CHEST - 2 VIEW COMPARISON:  January 27, 2022. FINDINGS: Stable cardiomegaly. Mild bibasilar subsegmental atelectasis or possibly edema is noted. Bony thorax is unremarkable. IMPRESSION: Mild bibasilar subsegmental atelectasis or possibly edema is noted. Aortic Atherosclerosis (ICD10-I70.0). Electronically Signed   By: Marijo Conception M.D.   On: 09/09/2022 14:18    EKG: Normal sinus rhythm diffuse nonspecific ST-T wave changes  ASSESSMENT AND PLAN:  Acute pulmonary emboli with strain Lower extremity edema Congestive heart failure Diastolic dysfunction Hypertension Atrial flutter status post ablation Rheumatic valvular disease History of mitral valve prolapse Dysuria . Plan Consider embolectomy for pulmonary emboli as per vascular Continue supplemental oxygen therapy hypoxemia for pulm embolus Anticoagulation for pulmonary embolus Continue heart failure therapy for acute on chronic diastolic congestive heart failure Continue diuretic therapy for shortness of breath heart failure Elevated troponin probably demand ischemia related to pulmonary embolus Acute renal sufficiency somewhat improved Foley catheter in place for possible obstructive symptoms History of atrial flutter status post ablation continue conservative therapy Patient appears to be a moderate risk for vascular procedure for pulmonary emboli  Signed: Yolonda Kida MD 09/12/2022, 1:09 PM

## 2022-09-13 ENCOUNTER — Encounter: Payer: Self-pay | Admitting: Student

## 2022-09-13 ENCOUNTER — Inpatient Hospital Stay: Payer: Medicare Other

## 2022-09-13 DIAGNOSIS — Z515 Encounter for palliative care: Secondary | ICD-10-CM | POA: Diagnosis not present

## 2022-09-13 DIAGNOSIS — I2609 Other pulmonary embolism with acute cor pulmonale: Secondary | ICD-10-CM | POA: Diagnosis not present

## 2022-09-13 DIAGNOSIS — Z7189 Other specified counseling: Secondary | ICD-10-CM

## 2022-09-13 LAB — CBC
HCT: 39.4 % (ref 39.0–52.0)
Hemoglobin: 12.9 g/dL — ABNORMAL LOW (ref 13.0–17.0)
MCH: 33.9 pg (ref 26.0–34.0)
MCHC: 32.7 g/dL (ref 30.0–36.0)
MCV: 103.4 fL — ABNORMAL HIGH (ref 80.0–100.0)
Platelets: 105 10*3/uL — ABNORMAL LOW (ref 150–400)
RBC: 3.81 MIL/uL — ABNORMAL LOW (ref 4.22–5.81)
RDW: 17.6 % — ABNORMAL HIGH (ref 11.5–15.5)
WBC: 5.3 10*3/uL (ref 4.0–10.5)
nRBC: 0 % (ref 0.0–0.2)

## 2022-09-13 LAB — BASIC METABOLIC PANEL
Anion gap: 7 (ref 5–15)
BUN: 30 mg/dL — ABNORMAL HIGH (ref 8–23)
CO2: 31 mmol/L (ref 22–32)
Calcium: 8.5 mg/dL — ABNORMAL LOW (ref 8.9–10.3)
Chloride: 100 mmol/L (ref 98–111)
Creatinine, Ser: 0.92 mg/dL (ref 0.61–1.24)
GFR, Estimated: >60 mL/min (ref >60)
Glucose, Bld: 86 mg/dL (ref 70–99)
Potassium: 4.2 mmol/L (ref 3.5–5.1)
Sodium: 138 mmol/L (ref 135–145)

## 2022-09-13 LAB — APTT: aPTT: 88 seconds — ABNORMAL HIGH (ref 24–36)

## 2022-09-13 LAB — HEPARIN LEVEL (UNFRACTIONATED)
Heparin Unfractionated: 0.17 IU/mL — ABNORMAL LOW (ref 0.30–0.70)
Heparin Unfractionated: 0.25 IU/mL — ABNORMAL LOW (ref 0.30–0.70)

## 2022-09-13 MED ORDER — HEPARIN BOLUS VIA INFUSION
1000.0000 [IU] | Freq: Once | INTRAVENOUS | Status: AC
  Administered 2022-09-13: 1000 [IU] via INTRAVENOUS
  Filled 2022-09-13: qty 1000

## 2022-09-13 MED ORDER — HEPARIN BOLUS VIA INFUSION
2000.0000 [IU] | Freq: Once | INTRAVENOUS | Status: AC
  Administered 2022-09-13: 2000 [IU] via INTRAVENOUS
  Filled 2022-09-13: qty 2000

## 2022-09-13 MED ORDER — FUROSEMIDE 10 MG/ML IJ SOLN
40.0000 mg | Freq: Two times a day (BID) | INTRAMUSCULAR | Status: DC
  Administered 2022-09-13: 40 mg via INTRAVENOUS
  Filled 2022-09-13 (×2): qty 4

## 2022-09-13 MED ORDER — IOHEXOL 350 MG/ML SOLN
75.0000 mL | Freq: Once | INTRAVENOUS | Status: AC | PRN
  Administered 2022-09-13: 75 mL via INTRAVENOUS

## 2022-09-13 NOTE — Progress Notes (Signed)
Marcus Kemp       Patient ID: Marcus Kemp MRN: [id number] DOB/AGE: [birth year] 89 y.o.  Admit date: 09/09/2022 Referring Physician Dr. Val Riles Primary Physician Dr. Fulton Reek Primary Cardiologist Dr. 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  CTA chest positive for bilateral pulmonary emboli and was started on a heparin infusion.  Cardiology is consulted for assistance with his heart failure.  He has diuresed a net -7 L, weaned to room air 2/19.  Interval history: -Feels good today, he states his mood is a lot better today -Reports some groin discomfort, but overall significant improvement in his peripheral edema. -Denies chest pain or shortness of breath -On 1 L of O2 by [place] at my time of evaluation, weaning trial today per nursing.  Review of systems complete and found to be negative unless listed above     Past Medical History:  Diagnosis Date   A-fib ([place])    a.) CHA2DS2-VASc = 4 (age x 2, CHF, aortic plaque). b.) s/p unsuccessful TEE cardioversion 04/28/2016 --> rec'd 200 J x1 and 360 J x 2. c.) rate/rhythm maintained on oral metoprolol succinante; chronically anticoagulated using dose reduced apixaban.   Anemia    Aortic atherosclerosis (HCC)    Arthritis    CHF (congestive heart failure) ([place])    a.) TTE 04/26/2016: EF >55%; mod LVH; MVP; mild-mod LAE, mild MR. b.) TTE 05/14/209: EF >55%; mod RVE; mild BAE; mod MR.TR; triv AR/PR. c.) TTE 03/28/2019: EF >55%; mild LVH; mod BAE; mild PR, mod MR/TR.   GERD (gastroesophageal reflux disease)    Heart murmur    History of kidney stones    HOH (hard of hearing)    a.) hearing aid to LEFT ear.   Hyperlipidemia     Infrarenal abdominal aortic aneurysm (AAA) without rupture ([place]) 05/05/2021   a.) CT 05/05/2021 --> measured 3.0 cm.   MVP (mitral valve prolapse)    Peripheral edema    Pneumonia    Pulmonary HTN ([place]) 04/26/2016   a.) TTE 04/26/2016: EF >55%; PASP 45. b.) TTE 12/06/2017: EF >55%; RVSP 76.6 mmHg. c.) TTE 03/28/2019: EF >55%; RVSP 77.8 mmHg.   Renal cell carcinoma (HCC)    Rheumatic fever    Sepsis ([place])    Valvular heart disease     Past Surgical History:  Procedure Laterality Date   INGUINAL HERNIA REPAIR Left 01/13/2022   Procedure: HERNIA REPAIR INGUINAL ADULT;  Surgeon: Robert Bellow, MD;  Location: [facility] ORS;  Service: General;  Laterality: Left;   INSERTION OF MESH  01/13/2022   Procedure: INSERTION OF MESH;  Surgeon: Robert Bellow, MD;  Location: [facility] ORS;  Service: General;;   LITHOTRIPSY     TEE WITH CARDIOVERSION N/A    P   TONSILLECTOMY      Medications Prior to Admission  Medication Sig Dispense Refill Last Dose   acetaminophen (TYLENOL) 500 MG tablet Take 1,000 mg by mouth at bedtime.   unknown   apixaban (ELIQUIS) 2.5 MG TABS tablet Take 1 tablet (2.5 mg total) by mouth 2 (two) times daily. 60 tablet 0 09/09/2022   Azelastine HCl 137 MCG/SPRAY SOLN SMARTSIG:2 Puff(s) Both Nares Twice Daily   unknown   cetirizine (ZYRTEC) 10  MG tablet SMARTSIG:1 pill By Mouth Daily PRN   unknown   hydrOXYzine (VISTARIL) 25 MG capsule Take 25-50 mg by mouth at bedtime.   09/08/2022   metoprolol succinate (TOPROL-XL) 25 MG 24 hr tablet Take 25 mg by mouth every morning.   09/09/2022   mirtazapine (REMERON) 7.5 MG tablet Take 7.5 mg by mouth at bedtime.   09/08/2022   vitamin B-12 (CYANOCOBALAMIN) 500 MCG tablet Take 1 tablet (500 mcg total) by mouth daily. 90 tablet 3 09/09/2022   furosemide (LASIX) 20 MG tablet Take 2 tablets (40 mg total) by mouth every morning. 60 tablet 0    OVER THE COUNTER MEDICATION Take 2 tablets by mouth at bedtime. Seratame (Patient not taking: Reported on  09/09/2022)   Not Taking   OVER THE COUNTER MEDICATION Take 1 tablet by mouth at bedtime. Relaxium (Patient not taking: Reported on 09/09/2022)   Not Taking   Social History   Socioeconomic History   Marital status: Married    Spouse name: Not on file   Number of children: Not on file   Years of education: Not on file   Highest education level: Not on file  Occupational History   Not on file  Tobacco Use   Smoking status: Former    Packs/day: 0.25    Years: 8.00    Total pack years: 2.00    Types: Cigarettes    Quit date: 73    Years since quitting: 84.1   Smokeless tobacco: Never  Vaping Use   Vaping Use: Never used  Substance and Sexual Activity   Alcohol use: No   Drug use: Never   Sexual activity: Not on file  Other Topics Concern   Not on file  Social History Narrative   Not on file   Social Determinants of Health   Financial Resource Strain: Not on file  Food Insecurity: No Food Insecurity (09/10/2022)   Hunger Vital Sign    Worried About Running Out of Food in the Last Year: Never true    Ran Out of Food in the Last Year: Never true  Transportation Needs: No Transportation Needs (09/10/2022)   PRAPARE - Hydrologist (Medical): No    Lack of Transportation (Non-Medical): No  Physical Activity: Not on file  Stress: Not on file  Social Connections: Not on file  Intimate Partner Violence: Not At Risk (09/10/2022)   Humiliation, Afraid, Rape, and Kick questionnaire    Fear of Current or Ex-Partner: No    Emotionally Abused: No    Physically Abused: No    Sexually Abused: No    Family History  Problem Relation Age of Onset   Heart disease Sister    Hypertension Other       Intake/Output Summary (Last 24 hours) at 09/13/2022 0934 Last data filed at 09/13/2022 0600 Gross per 24 hour  Intake 493.05 ml  Output 1350 ml  Net -856.95 ml    Vitals:   09/12/22 1652 09/12/22 1935 09/12/22 2332 09/13/22 0347  BP: 106/65 (!) 109/50  100/65 104/68  Pulse: 65 72 75 72  Resp:  '16 18 16  '$ Temp:  97.9 F (36.6 C) 97.8 F (36.6 C) 98.3 F (36.8 C)  TempSrc:  Oral Oral Oral  SpO2: 98% 100% 96% 99%  Weight:      Height:        PHYSICAL EXAM General: Pleasant elderly caucasian male, in no acute distress. Sitting upright in bed, wife and daughter at  bedside.  HEENT:  Normocephalic and atraumatic. Hard of hearing.  Neck:  No JVD.  Lungs: Normal respiratory effort on 1L [place]. Trace crackles bilaterally. Heart: HRRR . Normal S1 and S2. 3/6 holosystolic blowing murmur best heard at the apex Abdomen: Non-distended appearing.  Msk: Normal strength and tone for age. Extremities:  Chronic venous stasis hyperpigmentation, no peripheral edema. Skin wrinkling & dimpling  Neuro: Alert and oriented X 3. Psych:  Answers questions appropriately.   Labs: Basic Metabolic Panel: Recent Labs    09/11/22 0402 09/12/22 0729 09/13/22 0629  NA 137 136 138  K 3.6 3.2* 4.2  CL 98 98 100  CO2 32 31 31  GLUCOSE 84 89 86  BUN 37* 29* 30*  CREATININE 1.28* 1.07 0.92  CALCIUM 8.8* 8.2* 8.5*  MG 1.9 1.7  --   PHOS 3.1 2.7  --    Liver Function Tests: No results for input(s): "AST", "ALT", "ALKPHOS", "BILITOT", "PROT", "ALBUMIN" in the last 72 hours. No results for input(s): "LIPASE", "AMYLASE" in the last 72 hours. CBC: Recent Labs    09/12/22 0729 09/13/22 0629  WBC 5.2 5.3  HGB 12.0* 12.9*  HCT 36.2* 39.4  MCV 103.1* 103.4*  PLT 105* 105*   Cardiac Enzymes: No results for input(s): "CKTOTAL", "CKMB", "CKMBINDEX", "TROPONINIHS" in the last 72 hours. BNP: No results for input(s): "BNP" in the last 72 hours. D-Dimer: No results for input(s): "DDIMER" in the last 72 hours. Hemoglobin A1C: No results for input(s): "HGBA1C" in the last 72 hours. Fasting Lipid Panel: No results for input(s): "CHOL", "HDL", "LDLCALC", "TRIG", "CHOLHDL", "LDLDIRECT" in the last 72 hours. Thyroid Function Tests: No results for input(s): "TSH",  "T4TOTAL", "T3FREE", "THYROIDAB" in the last 72 hours.  Invalid input(s): "FREET3" Anemia Panel: No results for input(s): "VITAMINB12", "FOLATE", "FERRITIN", "TIBC", "IRON", "RETICCTPCT" in the last 72 hours.   Radiology: ECHOCARDIOGRAM COMPLETE  Result Date: 09/10/2022    ECHOCARDIOGRAM REPORT   Patient Name:   Marcus Kemp Date of Exam: 09/10/2022 Medical Rec #:  [id number]         Height:       66.0 in Accession #:    [id number]        Weight:       153.0 lb Date of Birth:  1924/08/23        BSA:          1.785 m Patient Age:    89 years          BP:           108/67 mmHg Patient Gender: M                 HR:           62 bpm. Exam Location:  [facility] Procedure: 2D Echo, Cardiac Doppler and Color Doppler Indications:     CHF  History:         Patient has prior history of Echocardiogram examinations, most                  recent 01/28/2022. CHF, Arrythmias:Atrial Fibrillation,                  Signs/Symptoms:Edema; Risk Factors:Dyslipidemia. Pulmonary                  embolus, Rheumatic fever age 67.  Sonographer:     Wenda Low Referring Phys:  [id number] Val Riles Diagnosing Phys: Ida Rogue MD IMPRESSIONS  1. Left ventricular ejection fraction,  by estimation, is 55 to 60%. The left ventricle has normal function. The left ventricle has no regional wall motion abnormalities. There is moderate left ventricular hypertrophy. Left ventricular diastolic parameters are indeterminate. There is the interventricular septum is flattened in systole and diastole, consistent with right ventricular pressure and volume overload.  2. Right ventricular systolic function is mildly reduced. The right ventricular size is moderately enlarged. There is severely elevated pulmonary artery systolic pressure. The estimated right ventricular systolic pressure is [id number] mmHg.  3. Left atrial size was moderately dilated.  4. Right atrial size was mildly dilated.  5. The mitral valve is normal in structure. Moderate to severe  mitral valve regurgitation. No evidence of mitral stenosis.  6. Tricuspid valve regurgitation is moderate to severe.  7. The aortic valve has an indeterminant number of cusps. Aortic valve regurgitation is not visualized. Aortic valve sclerosis/calcification is present, without any evidence of aortic stenosis.  8. There is borderline dilatation of the ascending aorta, measuring 36 mm.  9. The inferior vena cava is dilated in size with <50% respiratory variability, suggesting right atrial pressure of 15 mmHg. FINDINGS  Left Ventricle: Left ventricular ejection fraction, by estimation, is 55 to 60%. The left ventricle has normal function. The left ventricle has no regional wall motion abnormalities. The left ventricular internal cavity size was normal in size. There is  moderate left ventricular hypertrophy. The interventricular septum is flattened in systole and diastole, consistent with right ventricular pressure and volume overload. Left ventricular diastolic parameters are indeterminate. Right Ventricle: The right ventricular size is moderately enlarged. No increase in right ventricular wall thickness. Right ventricular systolic function is mildly reduced. There is severely elevated pulmonary artery systolic pressure. The tricuspid regurgitant velocity is 3.72 m/s, and with an assumed right atrial pressure of 15 mmHg, the estimated right ventricular systolic pressure is [id number] mmHg. Left Atrium: Left atrial size was moderately dilated. Right Atrium: Right atrial size was mildly dilated. Pericardium: There is no evidence of pericardial effusion. Mitral Valve: The mitral valve is normal in structure. There is moderate thickening of the mitral valve leaflet(s). There is moderate calcification of the mitral valve leaflet(s). Moderate to severe mitral valve regurgitation, with posteriorly-directed jet. No evidence of mitral valve stenosis. MV peak gradient, 40.4 mmHg. The mean mitral valve gradient is 25.5 mmHg.  Tricuspid Valve: The tricuspid valve is normal in structure. Tricuspid valve regurgitation is moderate to severe. No evidence of tricuspid stenosis. Aortic Valve: The aortic valve has an indeterminant number of cusps. Aortic valve regurgitation is not visualized. Aortic valve sclerosis/calcification is present, without any evidence of aortic stenosis. Aortic valve mean gradient measures 4.0 mmHg. Aortic valve peak gradient measures 7.3 mmHg. Aortic valve area, by VTI measures 2.23 cm. Pulmonic Valve: The pulmonic valve was normal in structure. Pulmonic valve regurgitation is mild. No evidence of pulmonic stenosis. Aorta: The aortic root is normal in size and structure. There is borderline dilatation of the ascending aorta, measuring 36 mm. Venous: The inferior vena cava is dilated in size with less than 50% respiratory variability, suggesting right atrial pressure of 15 mmHg. IAS/Shunts: No atrial level shunt detected by color flow Doppler.  LEFT VENTRICLE PLAX 2D LVIDd:         3.70 cm LVIDs:         2.50 cm LV PW:         1.10 cm LV IVS:        1.30 cm LVOT diam:     2.00 cm  LV SV:         60 LV SV Index:   34 LVOT Area:     3.14 cm  RIGHT VENTRICLE RV Basal diam:  4.80 cm RV Mid diam:    4.00 cm RV S prime:     9.03 cm/s LEFT ATRIUM             Index        RIGHT ATRIUM           Index LA diam:        4.40 cm 2.47 cm/m   RA Area:     22.90 cm LA Vol (A2C):   97.4 ml 54.58 ml/m  RA Volume:   68.80 ml  38.55 ml/m LA Vol (A4C):   87.2 ml 48.86 ml/m LA Biplane Vol: 95.4 ml 53.46 ml/m  AORTIC VALVE                    PULMONIC VALVE AV Area (Vmax):    2.30 cm     PV Vmax:       0.91 m/s AV Area (Vmean):   2.18 cm     PV Peak grad:  3.3 mmHg AV Area (VTI):     2.23 cm AV Vmax:           135.00 cm/s AV Vmean:          89.600 cm/s AV VTI:            0.270 m AV Peak Grad:      7.3 mmHg AV Mean Grad:      4.0 mmHg LVOT Vmax:         99.00 cm/s LVOT Vmean:        62.100 cm/s LVOT VTI:          0.192 m LVOT/AV VTI  ratio: 0.71  AORTA Ao Root diam: 3.40 cm Ao Asc diam:  3.60 cm MITRAL VALVE                  TRICUSPID VALVE MV Area (PHT): 4.29 cm       TR Peak grad:   55.4 mmHg MV Area VTI:   0.66 cm       TR Vmax:        372.00 cm/s MV Peak grad:  40.4 mmHg MV Mean grad:  25.5 mmHg      SHUNTS MV Vmax:       3.18 m/s       Systemic VTI:  0.19 m MV Vmean:      195.7 cm/s     Systemic Diam: 2.00 cm MV Decel Time: 177 msec MR Peak grad:    72.6 mmHg MR Mean grad:    49.0 mmHg MR Vmax:         426.00 cm/s MR Vmean:        327.0 cm/s MR PISA:         4.02 cm MR PISA Eff ROA: 80 mm MR PISA Radius:  0.80 cm MV E velocity: 155.00 cm/s Ida Rogue MD Electronically signed by Ida Rogue MD Signature Date/Time: 09/10/2022/5:07:49 PM    Final    US Venous Img Lower Bilateral (DVT)  Result Date: 09/10/2022 CLINICAL DATA:  Pulmonary embolism. EXAM: BILATERAL LOWER EXTREMITY VENOUS DOPPLER ULTRASOUND TECHNIQUE: Gray-scale sonography with graded compression, as well as color Doppler and duplex ultrasound were performed to evaluate the lower extremity deep venous systems from the level of the common femoral vein and including  the common femoral, femoral, profunda femoral, popliteal and calf veins including the posterior tibial, peroneal and gastrocnemius veins when visible. The superficial great saphenous vein was also interrogated. Spectral Doppler was utilized to evaluate flow at rest and with distal augmentation maneuvers in the common femoral, femoral and popliteal veins. COMPARISON:  None Available. FINDINGS: RIGHT LOWER EXTREMITY Common Femoral Vein: No evidence of thrombus. Normal compressibility, respiratory phasicity and response to augmentation. Saphenofemoral Junction: No evidence of thrombus. Normal compressibility and flow on color Doppler imaging. Profunda Femoral Vein: No evidence of thrombus. Normal compressibility and flow on color Doppler imaging. Femoral Vein: No evidence of thrombus. Normal compressibility,  respiratory phasicity and response to augmentation. Popliteal Vein: No evidence of thrombus. Normal compressibility, respiratory phasicity and response to augmentation. Calf Veins: No evidence of thrombus. Normal compressibility and flow on color Doppler imaging. Superficial Great Saphenous Vein: No evidence of thrombus. Normal compressibility. Venous Reflux:  None. Other Findings:  None. LEFT LOWER EXTREMITY Common Femoral Vein: No evidence of thrombus. Normal compressibility, respiratory phasicity and response to augmentation. Saphenofemoral Junction: No evidence of thrombus. Normal compressibility and flow on color Doppler imaging. Profunda Femoral Vein: No evidence of thrombus. Normal compressibility and flow on color Doppler imaging. Femoral Vein: No evidence of thrombus. Normal compressibility, respiratory phasicity and response to augmentation. Popliteal Vein: No evidence of thrombus. Normal compressibility, respiratory phasicity and response to augmentation. Calf Veins: No evidence of thrombus. Normal compressibility and flow on color Doppler imaging. Superficial Great Saphenous Vein: No evidence of thrombus. Normal compressibility. Venous Reflux:  None. Other Findings:  None. IMPRESSION: No evidence of deep venous thrombosis in either lower extremity. Electronically Signed   By: Valetta Mole M.D.   On: 09/10/2022 11:44   CT Angio Chest Pulmonary Embolism (PE) W or WO Contrast  Result Date: 09/09/2022 CLINICAL DATA:  Pulmonary embolus suspected with high probability. Bilateral leg swelling and fluid buildup. Some shortness of breath. EXAM: CT ANGIOGRAPHY CHEST WITH CONTRAST TECHNIQUE: Multidetector CT imaging of the chest was performed using the standard protocol during bolus administration of intravenous contrast. Multiplanar CT image reconstructions and MIPs were obtained to evaluate the vascular anatomy. RADIATION DOSE REDUCTION: This exam was performed according to the departmental dose-optimization  program which includes automated exposure control, adjustment of the mA and/or kV according to patient size and/or use of iterative reconstruction technique. CONTRAST:  34m OMNIPAQUE IOHEXOL 350 MG/ML SOLN COMPARISON:  06/26/2021 FINDINGS: Cardiovascular: There is good opacification of the central and segmental pulmonary arteries. Examination is technically adequate. Filling defects are demonstrated in multiple bilateral lower lobe segmental and subsegmental pulmonary arteries consistent with acute pulmonary embolus. The RV to LV ratio is elevated at 1.8. This may indicate evidence of right heart strain although the clot burden is relatively low and changes could also be related to underlying heart failure. Clinical correlation suggested. Diffuse cardiac enlargement. Normal caliber thoracic aorta. Calcification of the aorta and coronary arteries. Mediastinum/Nodes: Thyroid gland is unremarkable. Esophagus is decompressed. No significant lymphadenopathy. Lungs/Pleura: Small bilateral pleural effusions with basilar atelectasis. Peripheral interstitial changes in the lungs likely represents early pulmonary edema. No pneumothorax. Upper Abdomen: Probable hepatic cirrhosis with nodular liver contour and enlarged lateral segment left and caudate lobes of the liver. Gallbladder wall is thickened, likely related to liver disease. Moderate ascites in the upper abdomen. 6 mm stone in the right kidney. No hydronephrosis or hydroureter. Musculoskeletal: Degenerative changes in the spine. Old bilateral rib fractures. Review of the MIP images confirms the above findings. IMPRESSION: 1.  Positive examination for acute pulmonary embolus involving bilateral lower lobe pulmonary arteries. Elevated RV to LV ratio of 1.8 may indicate right heart strain. 2. Cardiac enlargement with bilateral pleural effusions and probable interstitial edema. 3. Probable hepatic cirrhosis with upper abdominal ascites. 4. 6 mm nonobstructing stones seen  in the right kidney. 5. Aortic atherosclerosis. Critical Value/emergent results were called by telephone at the time of interpretation on 09/09/2022 at 9:02 pm to provider [facility] , who verbally acknowledged these results. Electronically Signed   By: Lucienne Capers M.D.   On: 09/09/2022 21:07   US RENAL  Result Date: 09/09/2022 CLINICAL DATA:  Acute kidney injury EXAM: RENAL / URINARY TRACT ULTRASOUND COMPLETE COMPARISON:  CT 12/24/2021 FINDINGS: Right Kidney: Renal measurements: 9.9 x 5.3 x 5.2 = volume: 142 mL. Echogenicity within normal limits. No mass or hydronephrosis visualized. Central presumed parapelvic renal cyst as seen on prior CT scan measuring 19 mm. No collecting system dilatation or perinephric fluid. Left Kidney: Renal measurements: 9.9 x 4.4 x 4.5 = volume: 103 mL. There are some near anechoic foci in the left kidney with through transmission measuring 2.2 x 1.9 x 1.8 cm and 2.1 by 2.2 x 1.8 cm. On prior CT scan these are also felt to be cysts. There is however a lesion in the posterior aspect of the left kidney on the prior measuring 19 mm which was a solid lesion. This is not clearly seen on today's examination. Bladder: Appears normal for degree of bladder distention. Other: Scattered ascites IMPRESSION: No collecting system dilatation.  Bilateral renal cysts. Ascites. Prior CT scan described enhancing mass in the left kidney. This is not clearly seen on this examination today. On the prior study this is felt to be an aggressive lesion or potentially neoplasm. Please correlate with known history and workup Electronically Signed   By: Jill Side M.D.   On: 09/09/2022 19:32   DG Chest 2 View  Result Date: 09/09/2022 CLINICAL DATA:  Chest pain. EXAM: CHEST - 2 VIEW COMPARISON:  January 27, 2022. FINDINGS: Stable cardiomegaly. Mild bibasilar subsegmental atelectasis or possibly edema is noted. Bony thorax is unremarkable. IMPRESSION: Mild bibasilar subsegmental atelectasis or possibly  edema is noted. Aortic Atherosclerosis (ICD10-I70.0). Electronically Signed   By: Marijo Conception M.D.   On: 09/09/2022 14:18    TELEMETRY reviewed by me (LT) 09/13/2022 : Sinus rhythm rate 60s to 70s  EKG reviewed by me: NSR nonspecific TW abnormality inferolaterally, unchanged from prior from 01/2022   Data reviewed by me (LT) 09/13/2022: last cardiology clinic Kemp, ed Kemp, admission H&P, vascular surgery Kemp, hospitalist progress ntoe, last 24hr vitals, labs, tele, imaging.   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  CTA chest positive for bilateral pulmonary emboli and was started on a heparin infusion.  Cardiology is consulted for assistance with his heart failure.  He has diuresed a net -7 L, weaned to room air 2/19.  # Acute bilateral pulmonary emboli with right heart strain Present on CTA chest, lower extremity Doppler ultrasound negative for DVT bilaterally.  Initially requiring HFNC, now  weaned to room air.  Vascular surgery following, recommending repeat CTA to assess clot burden to determine the necessity for thrombectomy versus continued management with anticoagulations  # Acute on chronic HFpEF # Severe rheumatic mitral valve regurgitation BNP on admission 1600, pulmonary on chest x-ray and pleural effusions on CTA chest with associated atelectasis.  Initially clinically hypervolemic with pitting peripheral edema and a significant oxygen requirement.  Echo with preserved EF, severe MVR and RV overload. Diuresed well with IV Lasix, net -7 L since  admission, clinically euvolemic on exam today.  Suspect decompensation in the setting of PE as above -Continue IV Lasix 20 mg twice daily x 2 more doses, and likely discharge on 60 mg Lasix once daily (previously on 40 mg daily at home) -BP low normal, requiring midodrine. -Further GDMT limited by hypotension -Discussed not adding salt to foods, choosing low salt foods   # Persistent atrial fibrillation Rate controlled on telemetry, not on AV nodal medicines at baseline. -Previously on dose reduced Eliquis due to bleeding complications with full dose (hematuria). CHADS2VASC 4 (age, HTN, VTE)  # AKI  Likely prerenal from volume overload, renal function normalized following IV diuresis.   # Demand ischemia Minimal elevation with a flat trend at 48, 49 in the setting of AKI, PE, and decompensated HFpEF most consistent with demand/supply mismatch and not ACS.   This patient's plan of care was discussed and created with Dr. Saralyn Pilar and he is in agreement.  Signed: Tristan Schroeder , PA-C 09/13/2022, 9:34 AM [facility]

## 2022-09-13 NOTE — Consult Note (Signed)
ANTICOAGULATION CONSULT NOTE -   Pharmacy Consult for heparin infusion Indication: pulmonary embolus  Allergies  Allergen Reactions   Penicillins Anaphylaxis    Patient Measurements: Height: 5' 6"$  (167.6 cm) Weight: 61.5 kg (135 lb 9.3 oz) IBW/kg (Calculated) : 63.8 Heparin Dosing Weight: 69.4 kg  Vital Signs: Temp: 98.3 F (36.8 C) (02/19 0347) Temp Source: Oral (02/19 0347) BP: 104/68 (02/19 0347) Pulse Rate: 72 (02/19 0347)  Labs: Recent Labs    09/11/22 0402 09/11/22 1348 09/11/22 2250 09/12/22 0729 09/13/22 0629  HGB 12.3*  --   --  12.0* 12.9*  HCT 37.2*  --   --  36.2* 39.4  PLT 112*  --   --  105* 105*  APTT 130*   < > 79* 92* 88*  HEPARINUNFRC 0.59  --   --  0.28* 0.17*  CREATININE 1.28*  --   --  1.07 0.92   < > = values in this interval not displayed.     Estimated Creatinine Clearance: 41.8 mL/min (by C-G formula based on SCr of 0.92 mg/dL).   Medical History: Past Medical History:  Diagnosis Date   A-fib [facility])    a.) CHA2DS2-VASc = 4 (age x 2, CHF, aortic plaque). b.) s/p unsuccessful TEE cardioversion 04/28/2016 --> rec'd 200 J x1 and 360 J x 2. c.) rate/rhythm maintained on oral metoprolol succinante; chronically anticoagulated using dose reduced apixaban.   Anemia    Aortic atherosclerosis (HCC)    Arthritis    CHF (congestive heart failure) ([place])    a.) TTE 04/26/2016: EF >55%; mod LVH; MVP; mild-mod LAE, mild MR. b.) TTE 05/14/209: EF >55%; mod RVE; mild BAE; mod MR.TR; triv AR/PR. c.) TTE 03/28/2019: EF >55%; mild LVH; mod BAE; mild PR, mod MR/TR.   GERD (gastroesophageal reflux disease)    Heart murmur    History of kidney stones    HOH (hard of hearing)    a.) hearing aid to LEFT ear.   Hyperlipidemia    Infrarenal abdominal aortic aneurysm (AAA) without rupture ([place]) 05/05/2021   a.) CT 05/05/2021 --> measured 3.0 cm.   MVP (mitral valve prolapse)    Peripheral edema    Pneumonia    Pulmonary HTN ([place]) 04/26/2016   a.) TTE  04/26/2016: EF >55%; PASP 45. b.) TTE 12/06/2017: EF >55%; RVSP 76.6 mmHg. c.) TTE 03/28/2019: EF >55%; RVSP 77.8 mmHg.   Renal cell carcinoma (HCC)    Rheumatic fever    Sepsis ([place])    Valvular heart disease     Medications:  PTA: apixaban 2.5 mg; last dose 2/15 AM   Assessment: 89 y.o. male  who presents to the emergency department today because of leg swelling and SOB. Patient with history of afib on apixaban. On CT found to have acute PE with evidence of right heart strain. Pharmacy has been consulted to initaite and manage IV heparin infusion.  Baseline: HL > 1.10, aPTT 38  2/16 0651 aPTT > 200 2/16 1717 aPTT 173 2/17 0402 aPTT 130,  HL 0.59 not correlating, decrease to 550 u/hr 2/17 1348 aPTT 61  subtherapeutic- chg to 650 u/hr 2/17 2250 aPTT 79 therapeuticx1 2/18 0729 aPTT 92, (HL 0.28)  Therapeutic x 2, not correlating, cont at 650 u/hr  2/19 0629 aPTT 88,  HL 0.17  ,  therapeutic X 3   Goal of Therapy:  Heparin level 0.3-0.7 units/ml aPTT 66-102 seconds Monitor platelets by anticoagulation protocol: Yes   Plan:  2/19 @ 0629:  aPTT 88,  HL = 0.17 aPTT therapeutic X 3 but not correlating with HL  Will continue pt on current rate and recheck HL and aPTT w/ am labs. Will monitor via aPTT due to recent apixaban until HL and aPTT correlate Continue to monitor H&H and platelets  Shanena Pellegrino D, PharmD Clinical Pharmacist   09/13/2022,7:00 AM

## 2022-09-13 NOTE — Care Management Important Message (Signed)
Important Message  Patient Details  Name: GUNAR TRIMMER MRN: [id number] Date of Birth: Aug 19, 1924   Medicare Important Message Given:  Yes     Dannette Barbara 09/13/2022, 10:20 AM

## 2022-09-13 NOTE — Progress Notes (Signed)
[facility] Progress Note  Patient: Marcus Kemp    [id number]  DOA: 09/09/2022     Date of Service: the patient was seen and examined on 09/13/2022  Chief Complaint  Patient presents with   Leg Swelling   Brief hospital course: [name] is a 89 y.o. 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  Patient has sensorineural deafness.  Denies any chest pain, no palpitations, no abdominal pain.   ED Course:  creatinine 1.83, AKI, BNP 1637 very elevated, troponin 49--48 most likely due to demand ischemia, hemoglobin 12.3, elevated MCV.  CXR consistent with pulmonary edema. Patient was given Lasix IV in the ED and [facility] hospitalist consulted for further management   Assessment and Plan: Principal Problem: Acute pulmonary embolism  Active problems: CHF (congestive heart failure) ([place]) AKI   # Acute hypoxic respiratory failure 2/2 acute PE, pul. edema, and dCHF exacerbation Continue supplemental O2 admission gradually wean off Patient was cyanotic on exam, patient was placed on nonrebreather ABG consistent with hypoxia, Critical care was consulted, pt did not require any intervention D-dimer elevated, CTA positive for PE TTE LVEF 50 to 60% no WMA, mod LV hypertrophy, mod-severe MR and TR  Cardiology consulted for further recommendation   # Acute pulmonary embolism D-dimer elevated,  CTA positive for PE with right heart strain RV:LV 1.8, Cardiac enlargement with bilateral pleural effusions and probable interstitial edema. Probable hepatic cirrhosis with upper abdominal ascites. 6 mm nonobstructing stones seen in the right kidney. TTE LVEF 55 to 60%, LV hypertrophy, severe pulmonary hypertension, mitral valve regurgitation and  tricuspid valve regurgitation moderate to severe. Venous duplex b/l lower extremity negative for DVT Continue IV heparin infusion Vascular surgery consulted, recommended continue IV heparin infusion, optimize CHF and may need to repeat CTA for clot burden to decide for thrombectomy. 2/18 Vascular surgery recommended cardiology consult for complete cardiac workup 2/19 CTA chest shows improvement in the bilateral PE, no significant right ventricular strain due to submassive PE.    # Acute on chronic diastolic CHF exacerbation Presented with pulmonary edema and bilateral lower extremity edema S/p Lasix IV 60 mg x 1 dose given in the ED S/p Lasix IV infusion DC'd on 2/17, started Lasix 20 mg IV twice daily  2/17 BP was soft so started midodrine 5 mg p.o. 3 times daily Troponin slightly elevated most likely demand ischemia, patient denies any chest pain TTE as above Continue to monitor BP and titrate medication accordingly 2/19 CTA shows significant pleural effusion and pulmonary edema, increased Lasix from 20 to 40 mg IV twice daily    # Hypokalemia, potassium repleted. Monitor electrolytes and replete as needed.   # AKI, possible cardiorenal syndrome Monitor urine output and renal functions daily Insert Foley catheter for close urine output monitoring Avoid nephrotoxic medications, use renally dose medications US renal: No collecting system dilatation.  Bilateral renal cysts. Ascites. Cr 1.83--1.56--1.28 --0.92, AKI resolved   # Hypertension, A- flutter s/p ablation Held Lopressor due to low blood pressure Held Eliquis 2.5 mg p.o. twice daily home dose Started heparin IV infusion due to acute PE 2/17 started midodrine 5 mg p.o. 3 times daily with holding parameters due to low BP Monitor BP and titrate medications accordingly Continue to monitor on telemetry   #  Vision loss in the left eye, most likely due to cataract Patient is poor historian, patient wife mentioned that patient  is having gradual decrease in the vision in the left eye possible due to cataract.  She called ophthalmology appointment which is after few weeks.  No any new neurological changes, no any acute vision loss, less likely stroke.  We will continue to monitor.   Overall poor prognosis due to advanced age, palliative care consulted for goals of care discussion with family.  CODE STATUS was changed to DNR/DNI on 09/13/2022  Body mass index is 21.88 kg/m. Interventions:     Diet: Heart healthy diet, fluid striction 1.5 L/day DVT Prophylaxis: Therapeutic Anticoagulation with heparin IV infusion    Advance goals of care discussion: DNR  Family Communication: family was present at bedside, at the time of interview.  The pt provided permission to discuss medical plan with the family. Opportunity was given to ask question and all questions were answered satisfactorily.   Disposition:  Pt is from Home, admitted with acute pulmonary embolism, respiratory failure, diastolic CHF, still has PE on IV heparin infusion, which precludes a safe discharge. Discharge to TBD, needs PT and OT eval, when clinically stable may need few days to improve.  Subjective: No significant events overnight, patient slept well last night, patient was feeling improvement in the shortness of breath, denies any chest pain at the present, no any other active issues. Patient has significant deafness, so discussed management plan with patient's family at bedside.   Physical Exam: General: NAD, lying comfortably Appear in no distress, affect appropriate Eyes: PERRLA, very poor vision in the left eye most likely due to cataract ENT: Oral Mucosa Clear, moist, b/l SNHL Neck: no JVD,  Cardiovascular: S1 and S2 Present, Murmur audible,  Respiratory: good respiratory effort, Bilateral Air entry equal, mild bibasilar crackles, no wheezes  Abdomen: Bowel Sound present, Soft and no tenderness,  Skin: no rashes Extremities: No calf  tenderness.  Edema resolved Neurologic: without any new focal findings Gait not checked due to patient safety concerns  Vitals:   09/12/22 2332 09/13/22 0347 09/13/22 1039 09/13/22 1110  BP: 100/65 104/68 108/61   Pulse: 75 72 84   Resp: 18 16 (!) 21   Temp: 97.8 F (36.6 C) 98.3 F (36.8 C)    TempSrc: Oral Oral Oral   SpO2: 96% 99% 95% 96%  Weight:      Height:        Intake/Output Summary (Last 24 hours) at 09/13/2022 1430 Last data filed at 09/13/2022 1309 Gross per 24 hour  Intake 785.79 ml  Output 2350 ml  Net -1564.21 ml   Filed Weights   09/09/22 1352 09/10/22 2224  Weight: 69.4 kg 61.5 kg    Data Reviewed: I have personally reviewed and interpreted daily labs, tele strips, imagings as discussed above. I reviewed all nursing notes, pharmacy notes, vitals, pertinent old records I have discussed plan of care as described above with RN and patient/family.  CBC: Recent Labs  Lab 09/09/22 1356 09/10/22 0441 09/11/22 0402 09/12/22 0729 09/13/22 0629  WBC 5.1 5.6 5.8 5.2 5.3  HGB 12.3* 11.9* 12.3* 12.0* 12.9*  HCT 37.9* 37.1* 37.2* 36.2* 39.4  MCV 104.7* 105.4* 103.6* 103.1* 103.4*  PLT 134* 125* 112* 105* [id number]*   Basic Metabolic Panel: Recent Labs  Lab 09/09/22 1356 09/10/22 0441 09/11/22 0402 09/12/22 0729 09/13/22 0629  NA 136 133* 137 136 138  K 4.7 4.3 3.6 3.2* 4.2  CL 102  98 98 98 100  CO2 26 26 32 31 31  GLUCOSE 152* 91 84 89 86  BUN 52* 48* 37* 29* 30*  CREATININE 1.83* 1.56* 1.28* 1.07 0.92  CALCIUM 8.7* 8.3* 8.8* 8.2* 8.5*  MG  --  2.2 1.9 1.7  --   PHOS  --  4.6 3.1 2.7  --     Studies: CT Angio Chest Pulmonary Embolism (PE) W or WO Contrast  Result Date: 09/13/2022 CLINICAL DATA:  Bilateral lower lobe pulmonary embolism. EXAM: CT ANGIOGRAPHY CHEST WITH CONTRAST TECHNIQUE: Multidetector CT imaging of the chest was performed using the standard protocol during bolus administration of intravenous contrast. Multiplanar CT image  reconstructions and MIPs were obtained to evaluate the vascular anatomy. RADIATION DOSE REDUCTION: This exam was performed according to the departmental dose-optimization program which includes automated exposure control, adjustment of the mA and/or kV according to patient size and/or use of iterative reconstruction technique. CONTRAST:  22m OMNIPAQUE IOHEXOL 350 MG/ML SOLN COMPARISON:  Prior CTA of the chest on 09/09/2022 FINDINGS: Cardiovascular: The pulmonary arteries are adequately opacified. Central pulmonary arteries remain dilated with the main pulmonary artery measuring 3.7 cm. Under opacified posterior basilar branches of the right pulmonary artery likely contain some residual thrombus in a similar distribution as the prior study but are also difficult to assess due to atelectasis in the right lower lobe. No new or progressive emboli are seen in the right lung. Left lower lobe pulmonary emboli likely improved with less prominent filling defects in the posterior left lower lobe and residual atelectasis remaining making subsegmental branch vessel evaluation difficult. No progressive or new emboli are identified in the left lung. Evaluation of right heart strain is not pertinent by CTA due to lack of submassive pulmonary embolism. Underlying right-sided chamber and left atrial dilatation remains present consistent with cardiomyopathy. Calcified coronary artery plaque also again identified. There is no evidence pericardial fluid. Atherosclerosis of the thoracic aorta also again noted with no evidence of aneurysmal disease. Mediastinum/Nodes: No enlarged mediastinal, hilar, or axillary lymph nodes. Thyroid gland, trachea, and esophagus demonstrate no significant findings. Lungs/Pleura: Relatively small volume bilateral pleural effusions demonstrate slight increase in volume since the prior study. This results in increase in bibasilar atelectasis, especially on the left. Probable component worsening pulmonary  interstitial edema in the lungs bilaterally without overt airspace edema. No pneumothorax. Upper Abdomen: No acute abnormality. Musculoskeletal: No chest wall abnormality. No acute or significant osseous findings. Review of the MIP images confirms the above findings. IMPRESSION: 1. Evidence of probable residual pulmonary emboli in the posterior basilar branches of the right lower lobe pulmonary artery and improved appearance of left lower lobe pulmonary emboli. No new or progressive pulmonary emboli are identified. 2. Evidence of cardiomyopathy with right-sided chamber and left atrial dilatation. Estimation of right heart strain by CTA is not pertinent due to lack of submassive pulmonary embolism. 3. Coronary atherosclerosis. 4. Slight increase in volume of bilateral pleural effusions with associated increase in bibasilar atelectasis, especially on the left. 5. Probable component of worsening pulmonary interstitial edema in the lungs bilaterally without overt airspace edema. 6. Aortic atherosclerosis without evidence of aneurysmal disease. Aortic Atherosclerosis (ICD10-I70.0). Electronically Signed   By: GAletta EdouardM.D.   On: 09/13/2022 12:48    Scheduled Meds:  feeding supplement  237 mL Oral TID BM   furosemide  40 mg Intravenous BID   loratadine  10 mg Oral Daily   midodrine  5 mg Oral TID WC   sodium chloride flush  3 mL Intravenous Q12H   Continuous Infusions:  sodium chloride     heparin 800 Units/hr (09/13/22 1042)   PRN Meds: sodium chloride, acetaminophen **OR** acetaminophen, sodium chloride flush  Time spent: 50 minutes  Author: Val Riles. MD [facility] 09/13/2022 2:30 PM  To reach On-call, see care teams to locate the attending and reach out to them via [email or web address]. If 7PM-7AM, please contact night-coverage If you still have difficulty reaching the attending provider, please page the [facility] (Director on Call) for [facility] on amion for assistance.

## 2022-09-13 NOTE — Progress Notes (Signed)
   09/13/22 1100  Spiritual Encounters  Type of Visit Initial  Care provided to: Pt and family  Referral source Chaplain team  Reason for visit Religious ritual  OnCall Visit No  Spiritual Framework  Presenting Themes Significant life change  Community/Connection Family  Interventions  Spiritual Care Interventions Made Compassionate presence;Established relationship of care and support;Prayer;Reflective listening  Intervention Outcomes  Outcomes Connection to spiritual care;Connected to spiritual community;Awareness around self/spiritual resourses  Spiritual Care Plan  Spiritual Care Issues Still Outstanding No further spiritual care needs at this time (see row info)   This [place] visited [name] and family at the bedside. Family in the room,  Pt has  a strong faith and requested for a prayer. Marcus Kemp offered mindful intervention and a prayer of committal.

## 2022-09-13 NOTE — Progress Notes (Signed)
  Progress Note    09/13/2022 12:27 PM * No surgery found *  Subjective:  Mr Benen Laso is a 89 year old male past history of grade 2 diastolic dysfunction mitral valvular disease with prolapse history of hypertension atrial flutter status post ablation.  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 Patient had bilateral lower extremity edema over the past 3 days and developed what appeared to be acute pulmonary emboli based on imaging. To proceed with intervention because of RV strain cardiac clearance was needed. The patient denies any chest pain shortness of breath, denies any palpitations or tachycardia he is only had some recent urinary symptoms.  Today on exam he is lying in bed resting comfortably with his family at the bedside. It appears he has diuresed 7 Liters over the past 4 days and now is weaned off oxygen while resting with oxygen saturations of 95-96%. He has not ambulated without oxygen. He remains on a heparin infusion.   According to family (wife) patient was on 5 mg eliquis bid in the past and had multiple problems with GI Bleeding.     Vitals:   09/13/22 1039 09/13/22 1110  BP: 108/61   Pulse: 84   Resp: (!) 21   Temp:    SpO2: 95% 96%   Physical Exam: Cardiac:  RRR S1 and S2 without murmurs.  Lungs:  Clear throughout on auscultation with slight rales in the bases bilaterally  Incisions:  N/A Extremities:  Bilateral lower extremity edema +1. Unable to palpate pulses.  Abdomen:  Positive bowel sounds, soft, flat, non tender, non distended.  Neurologic: AAOX3, Neuro intact, follows commands, answers all questions appropriately.   CBC    Component Value Date/Time   WBC 5.3 09/13/2022 0629   RBC 3.81 (L) 09/13/2022 0629   HGB 12.9 (L) 09/13/2022 0629   HCT 39.4 09/13/2022 0629   PLT 105 (L) 09/13/2022 0629   MCV 103.4 (H) 09/13/2022 0629   MCH 33.9 09/13/2022 0629   MCHC 32.7 09/13/2022 0629   RDW 17.6  (H) 09/13/2022 0629   LYMPHSABS 1.2 01/27/2022 1520   MONOABS 0.6 01/27/2022 1520   EOSABS 0.1 01/27/2022 1520   BASOSABS 0.1 01/27/2022 1520    BMET    Component Value Date/Time   NA 138 09/13/2022 0629   K 4.2 09/13/2022 0629   CL 100 09/13/2022 0629   CO2 31 09/13/2022 0629   GLUCOSE 86 09/13/2022 0629   BUN 30 (H) 09/13/2022 0629   CREATININE 0.92 09/13/2022 0629   CALCIUM 8.5 (L) 09/13/2022 0629   GFRNONAA >60 09/13/2022 0629   GFRAA >60 05/10/2016 1800    INR No results found for: "INR"   Intake/Output Summary (Last 24 hours) at 09/13/2022 1227 Last data filed at 09/13/2022 1100 Gross per 24 hour  Intake 785.79 ml  Output 1350 ml  Net -564.21 ml     Assessment/Plan:  89 y.o. male who was admitted to the hospital for an exacerbation of CHF with pulmonary embolism. * No surgery found *   PLAN: Original plan was to Diurese patient then plan on CT chest again to evaluate clot burden. After 7 liters of diuresis plan to repeat CT Scan of the chest today. Will determine course of action post scan.  Continue on Heparin Infusion.   DVT prophylaxis:  Heparin Infusion    Drema Pry [facility] 09/13/2022 12:27 PM

## 2022-09-13 NOTE — Consult Note (Signed)
ANTICOAGULATION CONSULT NOTE  Pharmacy Consult for heparin infusion Indication: pulmonary embolus  Allergies  Allergen Reactions   Penicillins Anaphylaxis    Patient Measurements: Height: 5' 6"$  (167.6 cm) Weight: 61.5 kg (135 lb 9.3 oz) IBW/kg (Calculated) : 63.8 Heparin Dosing Weight: 69.4 kg  Vital Signs: Temp Source: Oral (02/19 1039) BP: 108/61 (02/19 1039) Pulse Rate: 84 (02/19 1039)  Labs: Recent Labs    09/11/22 0402 09/11/22 1348 09/11/22 2250 09/12/22 0729 09/13/22 0629  HGB 12.3*  --   --  12.0* 12.9*  HCT 37.2*  --   --  36.2* 39.4  PLT 112*  --   --  105* 105*  APTT 130*   < > 79* 92* 88*  HEPARINUNFRC 0.59  --   --  0.28* 0.17*  CREATININE 1.28*  --   --  1.07 0.92   < > = values in this interval not displayed.     Estimated Creatinine Clearance: 41.8 mL/min (by C-G formula based on SCr of 0.92 mg/dL).   Medical History: Past Medical History:  Diagnosis Date   A-fib [facility])    a.) CHA2DS2-VASc = 4 (age x 2, CHF, aortic plaque). b.) s/p unsuccessful TEE cardioversion 04/28/2016 --> rec'd 200 J x1 and 360 J x 2. c.) rate/rhythm maintained on oral metoprolol succinante; chronically anticoagulated using dose reduced apixaban.   Anemia    Aortic atherosclerosis (HCC)    Arthritis    CHF (congestive heart failure) ([place])    a.) TTE 04/26/2016: EF >55%; mod LVH; MVP; mild-mod LAE, mild MR. b.) TTE 05/14/209: EF >55%; mod RVE; mild BAE; mod MR.TR; triv AR/PR. c.) TTE 03/28/2019: EF >55%; mild LVH; mod BAE; mild PR, mod MR/TR.   GERD (gastroesophageal reflux disease)    Heart murmur    History of kidney stones    HOH (hard of hearing)    a.) hearing aid to LEFT ear.   Hyperlipidemia    Infrarenal abdominal aortic aneurysm (AAA) without rupture ([place]) 05/05/2021   a.) CT 05/05/2021 --> measured 3.0 cm.   MVP (mitral valve prolapse)    Peripheral edema    Pneumonia    Pulmonary HTN ([place]) 04/26/2016   a.) TTE 04/26/2016: EF >55%; PASP 45. b.) TTE  12/06/2017: EF >55%; RVSP 76.6 mmHg. c.) TTE 03/28/2019: EF >55%; RVSP 77.8 mmHg.   Renal cell carcinoma (HCC)    Rheumatic fever    Sepsis ([place])    Valvular heart disease     Medications:  PTA: apixaban 2.5 mg; last dose 2/15 AM   Assessment: 89 y.o. male  who presents to the emergency department today because of leg swelling and SOB. Patient with history of afib on apixaban (reduce dose, body weight borderline). On CT found to have acute PE with evidence of right heart strain. Pharmacy has been consulted to initaite and manage IV heparin infusion.  Goal of Therapy:  Heparin level 0.3-0.7 units/ml aPTT 66-102 seconds Monitor platelets by anticoagulation protocol: Yes   Plan: heparin level remains despite recent rate change ---Will give heparin bolus of 1000 units x 1 and increase heparin infusion to 950 units/hr ---Recheck heparin level in 8 hours after rate change and CBC daily while on heparin.    Dallie Piles, PharmD, BCPS Clinical Pharmacist   09/13/2022,4:00 PM

## 2022-09-13 NOTE — Consult Note (Signed)
Consultation Note Date: 09/13/2022   Patient Name: Marcus Kemp  DOB: Apr 25, 1925  MRN: [id number]  Age / Sex: 89 y.o., male  PCP: Idelle Crouch, MD Referring Physician: Val Riles, MD  Reason for Consultation: Establishing goals of care   HPI/Brief Hospital Course: 89 y.o. 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 Admitted on 09/09/2022 with complaints of increased weakness and BLE edema persisting for 1 week. In ED, found to have AKI and elevated BNP. CXR revealing pulmonary edema.  Elevated D-dimer. CTA revealing PE with associated right heart strain. Also noted, hepatic cirrhosis with upper abdominal ascites.  TTE LVEF 50-60%, moderate LV hypertrophy, moderate-severe MR and TR  Remains of heparin infusion Awaiting optimization of CHF prior to pursuing possible further interventions for PE  Palliative medicine was consulted for assisting with goals of care conversations.  Subjective:  Extensive chart review has been completed prior to meeting patient including labs, vital signs, imaging, progress notes, orders, and available advanced directive documents from current and previous encounters.  Introduced myself as a Designer, jewellery as a member of the palliative care team. Explained palliative medicine is specialized medical care for people living with serious illness. It focuses on providing relief from the symptoms and stress of a serious illness. The goal is to improve quality of life for both the patient and the family.   Visited with Marcus Kemp at his bedside. He has sensorineural deafness-wears hearing aids, better hearing quality in left ear. Able to participate in conversation, requires slow speaking and clarification at times. Alert and oriented. Mentions he feels much better  today with no complaints. Appetite has improved and he is assessed eating breakfast-no difficulty with swallowing noted. Remains on supplemental [place] oxygen therapy.  Daughter-Kimberly at bedside initially during visit. Later joined by [employer] and son-in-law. Marcus Kemp gives permission to speak with his family present in the room.  Inez Catalina explains she and Marcus Kemp live in their home with their son-Jeff. Marcus Kemp is an active man-tinkers around his home everyday. Able to care for himself independently, ambulates long distances without assistance. 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 Marcus Kemp prefers to spend his time outside and being active. He and Inez Catalina have been married for over 69 years. Faith is important to them both and are interested in chaplains visiting to offer prayer.  Inez Catalina able to explain her understanding of Mr. Check's current medical conditions. She explains for about a week prior to hospitalizations, Marcus Kemp began having a build up of fluid in his legs and was complaining of generalized weakness. She is able to explain her understanding that Marcus Kemp was found to have a blood clot in his lungs-receiving IV medication to help dissolve clot. Awaiting cardiology and vascular to decide on next steps. She is eager for Marcus Kemp to be assisted OOB today.  Chaplain services had previously been by and briefly discussed Advanced Directives. Inez Catalina explains she had read over the documents but wished to have Marcus Kemp involved in reading and completing forms.  We discussed HCPOA, Living Will and Code Status. At this time, family agrees Code Status discussions are most important.  Encouraged patient/family to consider DNR/DNI status understanding evidenced based poor outcomes in similar hospitalized patients, as the cause of the  arrest is likely associated with chronic/terminal disease  rather than a reversible acute cardio-pulmonary event.    After discussions with family as well as Marcus Kemp, DNR status is desired-orders placed. Marcus Kemp clearly communicates his wishes to peacefully transition to the next life when his time comes. He further explains he does not desire to be placed on mechanical ventilation or other life saving interventions. At this time he wishes to continue to receive full scope of treatment.  I discussed importance of continued conversations with family/support persons and all members of their medical team regarding overall plan of care and treatment options ensuring decisions are in alignment with patients goals of care.  All questions/concerns addressed. Emotional support provided to patient/family/support persons. PMT will continue to follow and support patient as needed.  Will address living will and HCPOA documentation during visit 2/20.  Objective: Primary Diagnoses: Present on Admission:  Acute respiratory failure with hypoxia ([place])  Acute pulmonary embolism with acute cor pulmonale (HCC)   Physical Exam Constitutional:      General: He is not in acute distress.    Appearance: He is not ill-appearing.  Pulmonary:     Effort: Pulmonary effort is normal. No respiratory distress.  Musculoskeletal:     Right lower leg: No edema.     Left lower leg: No edema.  Skin:    General: Skin is warm and dry.  Neurological:     Mental Status: He is alert.     Vital Signs: BP 108/61 (BP Location: Right Arm)   Pulse 84   Temp 98.3 F (36.8 C) (Oral)   Resp (!) 21   Ht '5\' 6"'$  (1.676 m)   Wt 61.5 kg   SpO2 96%   BMI 21.88 kg/m   Palliative Assessment/Data: 85%   Assessment and Plan  SUMMARY OF RECOMMENDATIONS   DNR Continue full scope of treatment PMT to continue to follow for ongoing support Chaplain services consulted for prayer and further support  Discussed With: Nursing staff and Dr. Dwyane Dee  Thank you for this consult and  allowing Palliative Medicine to participate in the care of Marcus Kemp. Marcus Kemp. Palliative medicine will continue to follow and assist as needed.   Time Total: 75 minutes  Greater than 50%  of this time was spent counseling and coordinating care related to the above assessment and plan.  Signed by: Theodoro Grist, DNP, AGNP-C Palliative Medicine    Please contact Palliative Medicine Team phone at [phone] for questions and concerns.  For individual provider: See Shea Evans

## 2022-09-13 NOTE — Consult Note (Signed)
ANTICOAGULATION CONSULT NOTE -   Pharmacy Consult for heparin infusion Indication: pulmonary embolus  Allergies  Allergen Reactions   Penicillins Anaphylaxis    Patient Measurements: Height: 5' 6"$  (167.6 cm) Weight: 61.5 kg (135 lb 9.3 oz) IBW/kg (Calculated) : 63.8 Heparin Dosing Weight: 69.4 kg  Vital Signs: Temp: 98.3 F (36.8 C) (02/19 0347) Temp Source: Oral (02/19 0347) BP: 104/68 (02/19 0347) Pulse Rate: 72 (02/19 0347)  Labs: Recent Labs    09/11/22 0402 09/11/22 1348 09/11/22 2250 09/12/22 0729 09/13/22 0629  HGB 12.3*  --   --  12.0* 12.9*  HCT 37.2*  --   --  36.2* 39.4  PLT 112*  --   --  105* 105*  APTT 130*   < > 79* 92* 88*  HEPARINUNFRC 0.59  --   --  0.28* 0.17*  CREATININE 1.28*  --   --  1.07 0.92   < > = values in this interval not displayed.     Estimated Creatinine Clearance: 41.8 mL/min (by C-G formula based on SCr of 0.92 mg/dL).   Medical History: Past Medical History:  Diagnosis Date   A-fib [facility])    a.) CHA2DS2-VASc = 4 (age x 2, CHF, aortic plaque). b.) s/p unsuccessful TEE cardioversion 04/28/2016 --> rec'd 200 J x1 and 360 J x 2. c.) rate/rhythm maintained on oral metoprolol succinante; chronically anticoagulated using dose reduced apixaban.   Anemia    Aortic atherosclerosis (HCC)    Arthritis    CHF (congestive heart failure) ([place])    a.) TTE 04/26/2016: EF >55%; mod LVH; MVP; mild-mod LAE, mild MR. b.) TTE 05/14/209: EF >55%; mod RVE; mild BAE; mod MR.TR; triv AR/PR. c.) TTE 03/28/2019: EF >55%; mild LVH; mod BAE; mild PR, mod MR/TR.   GERD (gastroesophageal reflux disease)    Heart murmur    History of kidney stones    HOH (hard of hearing)    a.) hearing aid to LEFT ear.   Hyperlipidemia    Infrarenal abdominal aortic aneurysm (AAA) without rupture ([place]) 05/05/2021   a.) CT 05/05/2021 --> measured 3.0 cm.   MVP (mitral valve prolapse)    Peripheral edema    Pneumonia    Pulmonary HTN ([place]) 04/26/2016   a.) TTE  04/26/2016: EF >55%; PASP 45. b.) TTE 12/06/2017: EF >55%; RVSP 76.6 mmHg. c.) TTE 03/28/2019: EF >55%; RVSP 77.8 mmHg.   Renal cell carcinoma (HCC)    Rheumatic fever    Sepsis ([place])    Valvular heart disease     Medications:  PTA: apixaban 2.5 mg; last dose 2/15 AM   Assessment: 89 y.o. male  who presents to the emergency department today because of leg swelling and SOB. Patient with history of afib on apixaban (reduce dose, body weight borderline). On CT found to have acute PE with evidence of right heart strain. Pharmacy has been consulted to initaite and manage IV heparin infusion.  2/16 0651 aPTT > 200 2/16 1717 aPTT 173 2/17 0402 aPTT 130,  HL 0.59 not correlating, decrease to 550 u/hr 2/17 1348 aPTT 61  subtherapeutic- chg to 650 u/hr 2/17 2250 aPTT 79 therapeuticx1 2/18 0729 aPTT 92, (HL 0.28)  Therapeutic x 2, not correlating, cont at 650 u/hr  2/19 0629 aPTT 88,  HL 0.17  ,  therapeutic X 3   Goal of Therapy:  Heparin level 0.3-0.7 units/ml aPTT 66-102 seconds Monitor platelets by anticoagulation protocol: Yes   Plan:  Heparin level is subtherapeutic while aPTT is therapeutic. Heparin  level is preferred for heparin monitoring. No note of any factors that would interfere with heparin levels. Will switch to heparin level monitoring. Will give heparin bolus of 2000 units x 1 and increase heparin infusion to 800 units/hr. Recheck heparin level in 8 hours and CBC daily while on heparin.    Marcus Kemp, PharmD, BCPS Clinical Pharmacist   09/13/2022,8:46 AM

## 2022-09-14 ENCOUNTER — Telehealth (HOSPITAL_COMMUNITY): Payer: Self-pay

## 2022-09-14 ENCOUNTER — Other Ambulatory Visit (HOSPITAL_COMMUNITY): Payer: Self-pay

## 2022-09-14 ENCOUNTER — Other Ambulatory Visit: Payer: Self-pay

## 2022-09-14 DIAGNOSIS — Z7189 Other specified counseling: Secondary | ICD-10-CM | POA: Diagnosis not present

## 2022-09-14 DIAGNOSIS — I5033 Acute on chronic diastolic (congestive) heart failure: Secondary | ICD-10-CM | POA: Diagnosis not present

## 2022-09-14 DIAGNOSIS — I2609 Other pulmonary embolism with acute cor pulmonale: Secondary | ICD-10-CM | POA: Diagnosis not present

## 2022-09-14 DIAGNOSIS — Z515 Encounter for palliative care: Secondary | ICD-10-CM | POA: Diagnosis not present

## 2022-09-14 LAB — CBC
HCT: 40.1 % (ref 39.0–52.0)
Hemoglobin: 13.1 g/dL (ref 13.0–17.0)
MCH: 33.4 pg (ref 26.0–34.0)
MCHC: 32.7 g/dL (ref 30.0–36.0)
MCV: 102.3 fL — ABNORMAL HIGH (ref 80.0–100.0)
Platelets: 103 10*3/uL — ABNORMAL LOW (ref 150–400)
RBC: 3.92 MIL/uL — ABNORMAL LOW (ref 4.22–5.81)
RDW: 17.4 % — ABNORMAL HIGH (ref 11.5–15.5)
WBC: 5.9 10*3/uL (ref 4.0–10.5)
nRBC: 0 % (ref 0.0–0.2)

## 2022-09-14 LAB — BASIC METABOLIC PANEL
Anion gap: 9 (ref 5–15)
BUN: 27 mg/dL — ABNORMAL HIGH (ref 8–23)
CO2: 29 mmol/L (ref 22–32)
Calcium: 8.5 mg/dL — ABNORMAL LOW (ref 8.9–10.3)
Chloride: 98 mmol/L (ref 98–111)
Creatinine, Ser: 0.97 mg/dL (ref 0.61–1.24)
GFR, Estimated: >60 mL/min (ref >60)
Glucose, Bld: 86 mg/dL (ref 70–99)
Potassium: 3.7 mmol/L (ref 3.5–5.1)
Sodium: 136 mmol/L (ref 135–145)

## 2022-09-14 LAB — HEPARIN LEVEL (UNFRACTIONATED): Heparin Unfractionated: 0.63 IU/mL (ref 0.30–0.70)

## 2022-09-14 MED ORDER — APIXABAN (ELIQUIS) VTE STARTER PACK (10MG AND 5MG)
ORAL_TABLET | ORAL | 0 refills | Status: DC
  Filled 2022-09-14: qty 74, 28d supply, fill #0

## 2022-09-14 MED ORDER — APIXABAN 5 MG PO TABS
10.0000 mg | ORAL_TABLET | Freq: Two times a day (BID) | ORAL | Status: DC
  Administered 2022-09-14 – 2022-09-15 (×3): 10 mg via ORAL
  Filled 2022-09-14 (×3): qty 2

## 2022-09-14 MED ORDER — ROPINIROLE HCL 0.25 MG PO TABS
0.2500 mg | ORAL_TABLET | Freq: Every day | ORAL | Status: DC
  Administered 2022-09-14: 0.25 mg via ORAL
  Filled 2022-09-14: qty 1

## 2022-09-14 MED ORDER — APIXABAN (ELIQUIS) VTE STARTER PACK (10MG AND 5MG): ORAL_TABLET | ORAL | 0 refills | Status: DC

## 2022-09-14 MED ORDER — FUROSEMIDE 40 MG PO TABS
40.0000 mg | ORAL_TABLET | Freq: Every day | ORAL | Status: DC
  Administered 2022-09-15: 40 mg via ORAL
  Filled 2022-09-14: qty 1

## 2022-09-14 MED ORDER — ROPINIROLE HCL 0.25 MG PO TABS
0.2500 mg | ORAL_TABLET | Freq: Once | ORAL | Status: AC
  Administered 2022-09-14: 0.25 mg via ORAL
  Filled 2022-09-14: qty 1

## 2022-09-14 MED ORDER — APIXABAN 5 MG PO TABS: 5.0000 mg | ORAL_TABLET | Freq: Two times a day (BID) | ORAL | Status: DC

## 2022-09-14 NOTE — Progress Notes (Signed)
[facility] NOTE       Patient ID: KASS WEISHEIT MRN: [id number] DOB/AGE: Dec 17, 1924 89 y.o.  Admit date: 09/09/2022 Referring Physician Dr. Val Riles Primary Physician Dr. Fulton Reek Primary Cardiologist Dr. Saralyn Pilar  Reason for Consultation AoCHF  HPI: Sallee Lange. 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  CTA chest positive for bilateral pulmonary emboli and was started on a heparin infusion.  Cardiology is consulted for assistance with his heart failure.  He has diuresed a net -8 L, weaned to room air 2/19.  Interval history: - Feels good today, ambulated without significant dyspnea - weaned to room air. No chest pain, worsening peripheral edema or scrotal swelling - eager to go home   Review of systems complete and found to be negative unless listed above     Past Medical History:  Diagnosis Date   A-fib ([place])    a.) CHA2DS2-VASc = 4 (age x 2, CHF, aortic plaque). b.) s/p unsuccessful TEE cardioversion 04/28/2016 --> rec'd 200 J x1 and 360 J x 2. c.) rate/rhythm maintained on oral metoprolol succinante; chronically anticoagulated using dose reduced apixaban.   Anemia    Aortic atherosclerosis (HCC)    Arthritis    CHF (congestive heart failure) ([place])    a.) TTE 04/26/2016: EF >55%; mod LVH; MVP; mild-mod LAE, mild MR. b.) TTE 05/14/209: EF >55%; mod RVE; mild BAE; mod MR.TR; triv AR/PR. c.) TTE 03/28/2019: EF >55%; mild LVH; mod BAE; mild PR, mod MR/TR.   GERD (gastroesophageal reflux disease)    Heart murmur    History of kidney stones    HOH (hard of hearing)    a.) hearing aid to LEFT ear.   Hyperlipidemia    Infrarenal abdominal aortic aneurysm (AAA) without rupture ([place]) 05/05/2021   a.) CT 05/05/2021 --> measured 3.0  cm.   MVP (mitral valve prolapse)    Peripheral edema    Pneumonia    Pulmonary HTN ([place]) 04/26/2016   a.) TTE 04/26/2016: EF >55%; PASP 45. b.) TTE 12/06/2017: EF >55%; RVSP 76.6 mmHg. c.) TTE 03/28/2019: EF >55%; RVSP 77.8 mmHg.   Renal cell carcinoma (HCC)    Rheumatic fever    Sepsis ([place])    Valvular heart disease     Past Surgical History:  Procedure Laterality Date   INGUINAL HERNIA REPAIR Left 01/13/2022   Procedure: HERNIA REPAIR INGUINAL ADULT;  Surgeon: Robert Bellow, MD;  Location: [facility] ORS;  Service: General;  Laterality: Left;   INSERTION OF MESH  01/13/2022   Procedure: INSERTION OF MESH;  Surgeon: Robert Bellow, MD;  Location: [facility] ORS;  Service: General;;   LITHOTRIPSY     TEE WITH CARDIOVERSION N/A    P   TONSILLECTOMY      Medications Prior to Admission  Medication Sig Dispense Refill Last Dose   acetaminophen (TYLENOL) 500 MG tablet Take 1,000 mg by mouth at bedtime.   unknown   apixaban (ELIQUIS) 2.5 MG TABS tablet Take 1 tablet (2.5 mg total) by mouth 2 (two) times daily. 60 tablet 0 09/09/2022   Azelastine HCl 137 MCG/SPRAY SOLN SMARTSIG:2 Puff(s) Both Nares Twice Daily   unknown   cetirizine (ZYRTEC) 10 MG tablet SMARTSIG:1 pill By Mouth Daily PRN   unknown   hydrOXYzine (VISTARIL) 25 MG capsule Take 25-50  mg by mouth at bedtime.   09/08/2022   metoprolol succinate (TOPROL-XL) 25 MG 24 hr tablet Take 25 mg by mouth every morning.   09/09/2022   mirtazapine (REMERON) 7.5 MG tablet Take 7.5 mg by mouth at bedtime.   09/08/2022   vitamin B-12 (CYANOCOBALAMIN) 500 MCG tablet Take 1 tablet (500 mcg total) by mouth daily. 90 tablet 3 09/09/2022   furosemide (LASIX) 20 MG tablet Take 2 tablets (40 mg total) by mouth every morning. 60 tablet 0    OVER THE COUNTER MEDICATION Take 2 tablets by mouth at bedtime. Seratame (Patient not taking: Reported on 09/09/2022)   Not Taking   OVER THE COUNTER MEDICATION Take 1 tablet by mouth at bedtime. Relaxium (Patient not  taking: Reported on 09/09/2022)   Not Taking   Social History   Socioeconomic History   Marital status: Married    Spouse name: Not on file   Number of children: Not on file   Years of education: Not on file   Highest education level: Not on file  Occupational History   Not on file  Tobacco Use   Smoking status: Former    Packs/day: 0.25    Years: 8.00    Total pack years: 2.00    Types: Cigarettes    Quit date: 80    Years since quitting: 84.1   Smokeless tobacco: Never  Vaping Use   Vaping Use: Never used  Substance and Sexual Activity   Alcohol use: No   Drug use: Never   Sexual activity: Not on file  Other Topics Concern   Not on file  Social History Narrative   Not on file   Social Determinants of Health   Financial Resource Strain: Not on file  Food Insecurity: No Food Insecurity (09/10/2022)   Hunger Vital Sign    Worried About Running Out of Food in the Last Year: Never true    Ran Out of Food in the Last Year: Never true  Transportation Needs: No Transportation Needs (09/10/2022)   PRAPARE - Hydrologist (Medical): No    Lack of Transportation (Non-Medical): No  Physical Activity: Not on file  Stress: Not on file  Social Connections: Not on file  Intimate Partner Violence: Not At Risk (09/10/2022)   Humiliation, Afraid, Rape, and Kick questionnaire    Fear of Current or Ex-Partner: No    Emotionally Abused: No    Physically Abused: No    Sexually Abused: No    Family History  Problem Relation Age of Onset   Heart disease Sister    Hypertension Other       Intake/Output Summary (Last 24 hours) at 09/14/2022 1255 Last data filed at 09/14/2022 1134 Gross per 24 hour  Intake 499.18 ml  Output 2200 ml  Net -1700.82 ml     Vitals:   09/14/22 0459 09/14/22 0849 09/14/22 1132 09/14/22 1225  BP:  109/79 113/63   Pulse:  77 86 91  Resp:  18 18   Temp:  (!) 97.5 F (36.4 C) 97.9 F (36.6 C)   TempSrc:   Oral   SpO2:   98% 99% 94%  Weight: 56.2 kg     Height:        PHYSICAL EXAM General: Pleasant elderly caucasian male, in no acute distress. Sitting upright in bed, wife at bedside.  HEENT:  Normocephalic and atraumatic. Hard of hearing.  Neck:  No JVD.  Lungs: Normal respiratory effort on room air  Trace crackles bilaterally. Heart: HRRR . Normal S1 and S2. 3/6 holosystolic blowing murmur best heard at the apex Abdomen: Non-distended appearing.  Msk: Normal strength and tone for age. Extremities:  Chronic venous stasis hyperpigmentation, no peripheral edema. Skin wrinkling & dimpling  Neuro: Alert and oriented X 3. Psych:  Answers questions appropriately.   Labs: Basic Metabolic Panel: Recent Labs    09/12/22 0729 09/13/22 0629 09/14/22 0330  NA 136 138 136  K 3.2* 4.2 3.7  CL 98 100 98  CO2 '31 31 29  '$ GLUCOSE 89 86 86  BUN 29* 30* 27*  CREATININE 1.07 0.92 0.97  CALCIUM 8.2* 8.5* 8.5*  MG 1.7  --   --   PHOS 2.7  --   --     Liver Function Tests: No results for input(s): "AST", "ALT", "ALKPHOS", "BILITOT", "PROT", "ALBUMIN" in the last 72 hours. No results for input(s): "LIPASE", "AMYLASE" in the last 72 hours. CBC: Recent Labs    09/13/22 0629 09/14/22 0330  WBC 5.3 5.9  HGB 12.9* 13.1  HCT 39.4 40.1  MCV 103.4* 102.3*  PLT 105* 103*    Cardiac Enzymes: No results for input(s): "CKTOTAL", "CKMB", "CKMBINDEX", "TROPONINIHS" in the last 72 hours. BNP: No results for input(s): "BNP" in the last 72 hours. D-Dimer: No results for input(s): "DDIMER" in the last 72 hours. Hemoglobin A1C: No results for input(s): "HGBA1C" in the last 72 hours. Fasting Lipid Panel: No results for input(s): "CHOL", "HDL", "LDLCALC", "TRIG", "CHOLHDL", "LDLDIRECT" in the last 72 hours. Thyroid Function Tests: No results for input(s): "TSH", "T4TOTAL", "T3FREE", "THYROIDAB" in the last 72 hours.  Invalid input(s): "FREET3" Anemia Panel: No results for input(s): "VITAMINB12", "FOLATE",  "FERRITIN", "TIBC", "IRON", "RETICCTPCT" in the last 72 hours.   Radiology: CT Angio Chest Pulmonary Embolism (PE) W or WO Contrast  Result Date: 09/13/2022 CLINICAL DATA:  Bilateral lower lobe pulmonary embolism. EXAM: CT ANGIOGRAPHY CHEST WITH CONTRAST TECHNIQUE: Multidetector CT imaging of the chest was performed using the standard protocol during bolus administration of intravenous contrast. Multiplanar CT image reconstructions and MIPs were obtained to evaluate the vascular anatomy. RADIATION DOSE REDUCTION: This exam was performed according to the departmental dose-optimization program which includes automated exposure control, adjustment of the mA and/or kV according to patient size and/or use of iterative reconstruction technique. CONTRAST:  50m OMNIPAQUE IOHEXOL 350 MG/ML SOLN COMPARISON:  Prior CTA of the chest on 09/09/2022 FINDINGS: Cardiovascular: The pulmonary arteries are adequately opacified. Central pulmonary arteries remain dilated with the main pulmonary artery measuring 3.7 cm. Under opacified posterior basilar branches of the right pulmonary artery likely contain some residual thrombus in a similar distribution as the prior study but are also difficult to assess due to atelectasis in the right lower lobe. No new or progressive emboli are seen in the right lung. Left lower lobe pulmonary emboli likely improved with less prominent filling defects in the posterior left lower lobe and residual atelectasis remaining making subsegmental branch vessel evaluation difficult. No progressive or new emboli are identified in the left lung. Evaluation of right heart strain is not pertinent by CTA due to lack of submassive pulmonary embolism. Underlying right-sided chamber and left atrial dilatation remains present consistent with cardiomyopathy. Calcified coronary artery plaque also again identified. There is no evidence pericardial fluid. Atherosclerosis of the thoracic aorta also again noted with no  evidence of aneurysmal disease. Mediastinum/Nodes: No enlarged mediastinal, hilar, or axillary lymph nodes. Thyroid gland, trachea, and esophagus demonstrate no significant findings. Lungs/Pleura: Relatively small  volume bilateral pleural effusions demonstrate slight increase in volume since the prior study. This results in increase in bibasilar atelectasis, especially on the left. Probable component worsening pulmonary interstitial edema in the lungs bilaterally without overt airspace edema. No pneumothorax. Upper Abdomen: No acute abnormality. Musculoskeletal: No chest wall abnormality. No acute or significant osseous findings. Review of the MIP images confirms the above findings. IMPRESSION: 1. Evidence of probable residual pulmonary emboli in the posterior basilar branches of the right lower lobe pulmonary artery and improved appearance of left lower lobe pulmonary emboli. No new or progressive pulmonary emboli are identified. 2. Evidence of cardiomyopathy with right-sided chamber and left atrial dilatation. Estimation of right heart strain by CTA is not pertinent due to lack of submassive pulmonary embolism. 3. Coronary atherosclerosis. 4. Slight increase in volume of bilateral pleural effusions with associated increase in bibasilar atelectasis, especially on the left. 5. Probable component of worsening pulmonary interstitial edema in the lungs bilaterally without overt airspace edema. 6. Aortic atherosclerosis without evidence of aneurysmal disease. Aortic Atherosclerosis (ICD10-I70.0). Electronically Signed   By: Aletta Edouard M.D.   On: 09/13/2022 12:48   ECHOCARDIOGRAM COMPLETE  Result Date: 09/10/2022    ECHOCARDIOGRAM REPORT   Patient Name:   Marcus Kemp Date of Exam: 09/10/2022 Medical Rec #:  [id number]         Height:       66.0 in Accession #:    [id number]        Weight:       153.0 lb Date of Birth:  1925-04-10        BSA:          1.785 m Patient Age:    89 years          BP:            108/67 mmHg Patient Gender: M                 HR:           62 bpm. Exam Location:  [facility] Procedure: 2D Echo, Cardiac Doppler and Color Doppler Indications:     CHF  History:         Patient has prior history of Echocardiogram examinations, most                  recent 01/28/2022. CHF, Arrythmias:Atrial Fibrillation,                  Signs/Symptoms:Edema; Risk Factors:Dyslipidemia. Pulmonary                  embolus, Rheumatic fever age 55.  Sonographer:     Wenda Low Referring Phys:  [id number] Val Riles Diagnosing Phys: Ida Rogue MD IMPRESSIONS  1. Left ventricular ejection fraction, by estimation, is 55 to 60%. The left ventricle has normal function. The left ventricle has no regional wall motion abnormalities. There is moderate left ventricular hypertrophy. Left ventricular diastolic parameters are indeterminate. There is the interventricular septum is flattened in systole and diastole, consistent with right ventricular pressure and volume overload.  2. Right ventricular systolic function is mildly reduced. The right ventricular size is moderately enlarged. There is severely elevated pulmonary artery systolic pressure. The estimated right ventricular systolic pressure is [id number] mmHg.  3. Left atrial size was moderately dilated.  4. Right atrial size was mildly dilated.  5. The mitral valve is normal in structure. Moderate to severe mitral valve regurgitation. No evidence of mitral stenosis.  6. Tricuspid  valve regurgitation is moderate to severe.  7. The aortic valve has an indeterminant number of cusps. Aortic valve regurgitation is not visualized. Aortic valve sclerosis/calcification is present, without any evidence of aortic stenosis.  8. There is borderline dilatation of the ascending aorta, measuring 36 mm.  9. The inferior vena cava is dilated in size with <50% respiratory variability, suggesting right atrial pressure of 15 mmHg. FINDINGS  Left Ventricle: Left ventricular ejection fraction, by  estimation, is 55 to 60%. The left ventricle has normal function. The left ventricle has no regional wall motion abnormalities. The left ventricular internal cavity size was normal in size. There is  moderate left ventricular hypertrophy. The interventricular septum is flattened in systole and diastole, consistent with right ventricular pressure and volume overload. Left ventricular diastolic parameters are indeterminate. Right Ventricle: The right ventricular size is moderately enlarged. No increase in right ventricular wall thickness. Right ventricular systolic function is mildly reduced. There is severely elevated pulmonary artery systolic pressure. The tricuspid regurgitant velocity is 3.72 m/s, and with an assumed right atrial pressure of 15 mmHg, the estimated right ventricular systolic pressure is [id number] mmHg. Left Atrium: Left atrial size was moderately dilated. Right Atrium: Right atrial size was mildly dilated. Pericardium: There is no evidence of pericardial effusion. Mitral Valve: The mitral valve is normal in structure. There is moderate thickening of the mitral valve leaflet(s). There is moderate calcification of the mitral valve leaflet(s). Moderate to severe mitral valve regurgitation, with posteriorly-directed jet. No evidence of mitral valve stenosis. MV peak gradient, 40.4 mmHg. The mean mitral valve gradient is 25.5 mmHg. Tricuspid Valve: The tricuspid valve is normal in structure. Tricuspid valve regurgitation is moderate to severe. No evidence of tricuspid stenosis. Aortic Valve: The aortic valve has an indeterminant number of cusps. Aortic valve regurgitation is not visualized. Aortic valve sclerosis/calcification is present, without any evidence of aortic stenosis. Aortic valve mean gradient measures 4.0 mmHg. Aortic valve peak gradient measures 7.3 mmHg. Aortic valve area, by VTI measures 2.23 cm. Pulmonic Valve: The pulmonic valve was normal in structure. Pulmonic valve regurgitation is  mild. No evidence of pulmonic stenosis. Aorta: The aortic root is normal in size and structure. There is borderline dilatation of the ascending aorta, measuring 36 mm. Venous: The inferior vena cava is dilated in size with less than 50% respiratory variability, suggesting right atrial pressure of 15 mmHg. IAS/Shunts: No atrial level shunt detected by color flow Doppler.  LEFT VENTRICLE PLAX 2D LVIDd:         3.70 cm LVIDs:         2.50 cm LV PW:         1.10 cm LV IVS:        1.30 cm LVOT diam:     2.00 cm LV SV:         60 LV SV Index:   34 LVOT Area:     3.14 cm  RIGHT VENTRICLE RV Basal diam:  4.80 cm RV Mid diam:    4.00 cm RV S prime:     9.03 cm/s LEFT ATRIUM             Index        RIGHT ATRIUM           Index LA diam:        4.40 cm 2.47 cm/m   RA Area:     22.90 cm LA Vol (A2C):   97.4 ml 54.58 ml/m  RA Volume:   68.80  ml  38.55 ml/m LA Vol (A4C):   87.2 ml 48.86 ml/m LA Biplane Vol: 95.4 ml 53.46 ml/m  AORTIC VALVE                    PULMONIC VALVE AV Area (Vmax):    2.30 cm     PV Vmax:       0.91 m/s AV Area (Vmean):   2.18 cm     PV Peak grad:  3.3 mmHg AV Area (VTI):     2.23 cm AV Vmax:           135.00 cm/s AV Vmean:          89.600 cm/s AV VTI:            0.270 m AV Peak Grad:      7.3 mmHg AV Mean Grad:      4.0 mmHg LVOT Vmax:         99.00 cm/s LVOT Vmean:        62.100 cm/s LVOT VTI:          0.192 m LVOT/AV VTI ratio: 0.71  AORTA Ao Root diam: 3.40 cm Ao Asc diam:  3.60 cm MITRAL VALVE                  TRICUSPID VALVE MV Area (PHT): 4.29 cm       TR Peak grad:   55.4 mmHg MV Area VTI:   0.66 cm       TR Vmax:        372.00 cm/s MV Peak grad:  40.4 mmHg MV Mean grad:  25.5 mmHg      SHUNTS MV Vmax:       3.18 m/s       Systemic VTI:  0.19 m MV Vmean:      195.7 cm/s     Systemic Diam: 2.00 cm MV Decel Time: 177 msec MR Peak grad:    72.6 mmHg MR Mean grad:    49.0 mmHg MR Vmax:         426.00 cm/s MR Vmean:        327.0 cm/s MR PISA:         4.02 cm MR PISA Eff ROA: 80 mm MR PISA  Radius:  0.80 cm MV E velocity: 155.00 cm/s Ida Rogue MD Electronically signed by Ida Rogue MD Signature Date/Time: 09/10/2022/5:07:49 PM    Final    US Venous Img Lower Bilateral (DVT)  Result Date: 09/10/2022 CLINICAL DATA:  Pulmonary embolism. EXAM: BILATERAL LOWER EXTREMITY VENOUS DOPPLER ULTRASOUND TECHNIQUE: Gray-scale sonography with graded compression, as well as color Doppler and duplex ultrasound were performed to evaluate the lower extremity deep venous systems from the level of the common femoral vein and including the common femoral, femoral, profunda femoral, popliteal and calf veins including the posterior tibial, peroneal and gastrocnemius veins when visible. The superficial great saphenous vein was also interrogated. Spectral Doppler was utilized to evaluate flow at rest and with distal augmentation maneuvers in the common femoral, femoral and popliteal veins. COMPARISON:  None Available. FINDINGS: RIGHT LOWER EXTREMITY Common Femoral Vein: No evidence of thrombus. Normal compressibility, respiratory phasicity and response to augmentation. Saphenofemoral Junction: No evidence of thrombus. Normal compressibility and flow on color Doppler imaging. Profunda Femoral Vein: No evidence of thrombus. Normal compressibility and flow on color Doppler imaging. Femoral Vein: No evidence of thrombus. Normal compressibility, respiratory phasicity and response to augmentation. Popliteal Vein: No evidence of thrombus. Normal compressibility,  respiratory phasicity and response to augmentation. Calf Veins: No evidence of thrombus. Normal compressibility and flow on color Doppler imaging. Superficial Great Saphenous Vein: No evidence of thrombus. Normal compressibility. Venous Reflux:  None. Other Findings:  None. LEFT LOWER EXTREMITY Common Femoral Vein: No evidence of thrombus. Normal compressibility, respiratory phasicity and response to augmentation. Saphenofemoral Junction: No evidence of thrombus.  Normal compressibility and flow on color Doppler imaging. Profunda Femoral Vein: No evidence of thrombus. Normal compressibility and flow on color Doppler imaging. Femoral Vein: No evidence of thrombus. Normal compressibility, respiratory phasicity and response to augmentation. Popliteal Vein: No evidence of thrombus. Normal compressibility, respiratory phasicity and response to augmentation. Calf Veins: No evidence of thrombus. Normal compressibility and flow on color Doppler imaging. Superficial Great Saphenous Vein: No evidence of thrombus. Normal compressibility. Venous Reflux:  None. Other Findings:  None. IMPRESSION: No evidence of deep venous thrombosis in either lower extremity. Electronically Signed   By: Valetta Mole M.D.   On: 09/10/2022 11:44   CT Angio Chest Pulmonary Embolism (PE) W or WO Contrast  Result Date: 09/09/2022 CLINICAL DATA:  Pulmonary embolus suspected with high probability. Bilateral leg swelling and fluid buildup. Some shortness of breath. EXAM: CT ANGIOGRAPHY CHEST WITH CONTRAST TECHNIQUE: Multidetector CT imaging of the chest was performed using the standard protocol during bolus administration of intravenous contrast. Multiplanar CT image reconstructions and MIPs were obtained to evaluate the vascular anatomy. RADIATION DOSE REDUCTION: This exam was performed according to the departmental dose-optimization program which includes automated exposure control, adjustment of the mA and/or kV according to patient size and/or use of iterative reconstruction technique. CONTRAST:  32m OMNIPAQUE IOHEXOL 350 MG/ML SOLN COMPARISON:  06/26/2021 FINDINGS: Cardiovascular: There is good opacification of the central and segmental pulmonary arteries. Examination is technically adequate. Filling defects are demonstrated in multiple bilateral lower lobe segmental and subsegmental pulmonary arteries consistent with acute pulmonary embolus. The RV to LV ratio is elevated at 1.8. This may indicate  evidence of right heart strain although the clot burden is relatively low and changes could also be related to underlying heart failure. Clinical correlation suggested. Diffuse cardiac enlargement. Normal caliber thoracic aorta. Calcification of the aorta and coronary arteries. Mediastinum/Nodes: Thyroid gland is unremarkable. Esophagus is decompressed. No significant lymphadenopathy. Lungs/Pleura: Small bilateral pleural effusions with basilar atelectasis. Peripheral interstitial changes in the lungs likely represents early pulmonary edema. No pneumothorax. Upper Abdomen: Probable hepatic cirrhosis with nodular liver contour and enlarged lateral segment left and caudate lobes of the liver. Gallbladder wall is thickened, likely related to liver disease. Moderate ascites in the upper abdomen. 6 mm stone in the right kidney. No hydronephrosis or hydroureter. Musculoskeletal: Degenerative changes in the spine. Old bilateral rib fractures. Review of the MIP images confirms the above findings. IMPRESSION: 1. Positive examination for acute pulmonary embolus involving bilateral lower lobe pulmonary arteries. Elevated RV to LV ratio of 1.8 may indicate right heart strain. 2. Cardiac enlargement with bilateral pleural effusions and probable interstitial edema. 3. Probable hepatic cirrhosis with upper abdominal ascites. 4. 6 mm nonobstructing stones seen in the right kidney. 5. Aortic atherosclerosis. Critical Value/emergent results were called by telephone at the time of interpretation on 09/09/2022 at 9:02 pm [facility] who verbally acknowledged these results. Electronically Signed   By: WLucienne CapersM.D.   On: 09/09/2022 21:07   [place]NAL  Result Date: 09/09/2022 CLINICAL DATA:  Acute kidney injury EXAM: RENAL / URINARY TRACT ULTRASOUND COMPLETE COMPARISON:  CT 12/24/2021 FINDINGS: Right  Kidney: Renal measurements: 9.9 x 5.3 x 5.2 = volume: 142 mL. Echogenicity within normal limits. No mass or  hydronephrosis visualized. Central presumed parapelvic renal cyst as seen on prior CT scan measuring 19 mm. No collecting system dilatation or perinephric fluid. Left Kidney: Renal measurements: 9.9 x 4.4 x 4.5 = volume: 103 mL. There are some near anechoic foci in the left kidney with through transmission measuring 2.2 x 1.9 x 1.8 cm and 2.1 by 2.2 x 1.8 cm. On prior CT scan these are also felt to be cysts. There is however a lesion in the posterior aspect of the left kidney on the prior measuring 19 mm which was a solid lesion. This is not clearly seen on today's examination. Bladder: Appears normal for degree of bladder distention. Other: Scattered ascites IMPRESSION: No collecting system dilatation.  Bilateral renal cysts. Ascites. Prior CT scan described enhancing mass in the left kidney. This is not clearly seen on this examination today. On the prior study this is felt to be an aggressive lesion or potentially neoplasm. Please correlate with known history and workup Electronically Signed   By: Jill Side M.D.   On: 09/09/2022 19:32   DG Chest 2 View  Result Date: 09/09/2022 CLINICAL DATA:  Chest pain. EXAM: CHEST - 2 VIEW COMPARISON:  January 27, 2022. FINDINGS: Stable cardiomegaly. Mild bibasilar subsegmental atelectasis or possibly edema is noted. Bony thorax is unremarkable. IMPRESSION: Mild bibasilar subsegmental atelectasis or possibly edema is noted. Aortic Atherosclerosis (ICD10-I70.0). Electronically Signed   By: Marijo Conception M.D.   On: 09/09/2022 14:18    TELEMETRY reviewed by me (LT) 09/14/2022 : Sinus rhythm rate 60s to 70s  EKG reviewed by me: NSR nonspecific TW abnormality inferolaterally, unchanged from prior from 01/2022   Data reviewed by me (LT) 09/14/2022: last cardiology clinic note, ed note, admission H&P, vascular surgery note, hospitalist progress ntoe, last 24hr vitals, labs, tele, imaging.   Principal Problem:   Acute pulmonary embolism with acute cor pulmonale (HCC) Active  Problems:   CHF (congestive heart failure) (HCC)   Acute respiratory failure with hypoxia (HCC)    ASSESSMENT AND PLAN:  Erickson Kocian. 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  CTA chest positive for bilateral pulmonary emboli and was started on a heparin infusion.  Cardiology is consulted for assistance with his heart failure.  He has diuresed a net -8 L, weaned to room air 2/19.  # Acute bilateral pulmonary emboli with right heart strain Present on CTA chest, lower extremity Doppler ultrasound negative for DVT bilaterally.  Initially requiring HFNC, now weaned to room air.  Vascular surgery following, recommending continued management with DOACs. Not planning for thrombectomy.   # Acute on chronic HFpEF # Severe rheumatic mitral valve regurgitation BNP on admission 1600, pulmonary on chest x-ray and pleural effusions on CTA chest with associated atelectasis.  Initially clinically hypervolemic with pitting peripheral edema and a significant oxygen requirement.  Echo with preserved EF, severe MVR and RV overload. Diuresed well with IV Lasix, net -8 L since admission, clinically euvolemic on exam today.  Suspect decompensation in the setting of PE as above -s/p multiple days of IV diuresis. Start back 40 mg Lasix once daily tomorrow. -BP low normal, requiring midodrine. -Further GDMT limited by hypotension -Discussed not adding salt to  foods, choosing low salt foods  -no further cardiac diagnostics necessary.  -will arrange for follow up in clinic 1-2 weeks following discharge with Dr. Saralyn Pilar / Clabe Seal, PA-C  # Persistent atrial fibrillation Rate controlled on telemetry, not on AV nodal medicines at baseline. -Previously on dose reduced Eliquis due to bleeding complications  with full dose (hematuria). CHADS2VASC 4 (age, HTN, VTE)  # AKI  Likely prerenal from volume overload, renal function normalized following IV diuresis.   # Demand ischemia Minimal elevation with a flat trend at 48, 49 in the setting of AKI, PE, and decompensated HFpEF most consistent with demand/supply mismatch and not ACS.   This patient's plan of care was discussed and created with Dr. Saralyn Pilar and he is in agreement.  Signed: Tristan Schroeder , PA-C 09/14/2022, 12:55 PM [facility]

## 2022-09-14 NOTE — Progress Notes (Signed)
[facility] Progress Note  Patient: Marcus Kemp    [id number]  DOA: 09/09/2022     Date of Service: the patient was seen and examined on 09/14/2022  Chief Complaint  Patient presents with   Leg Swelling   Brief hospital course: Marcus Kemp is a 89 y.o. 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  Patient has sensorineural deafness.  Denies any chest pain, no palpitations, no abdominal pain.   ED Course:  creatinine 1.83, AKI, BNP 1637 very elevated, troponin 49--48 most likely due to demand ischemia, hemoglobin 12.3, elevated MCV.  CXR consistent with pulmonary edema. Patient was given Lasix IV in the ED and [facility] hospitalist consulted for further management   Assessment and Plan: Principal Problem: Acute pulmonary embolism  Active problems: CHF (congestive heart failure) ([place]) AKI   # Acute hypoxic respiratory failure 2/2 acute PE, pul. edema, and dCHF exacerbation Patient was cyanotic on exam, patient was placed on nonrebreather ABG consistent with hypoxia, Critical care was consulted, pt did not require any intervention. D-dimer elevated, CTA positive for PE TTE LVEF 50 to 60% no WMA, mod LV hypertrophy, mod-severe MR and TR. 2/20 Oxygen weaned off, respiratory failure resolved, currently patient is saturating well on room air Cardiology consulted for further recommendation   # Acute pulmonary embolism D-dimer elevated,  CTA positive for PE with right heart strain RV:LV 1.8, Cardiac enlargement with bilateral pleural effusions and probable interstitial edema. Probable hepatic cirrhosis with upper abdominal ascites. 6 mm nonobstructing stones seen in the right kidney. TTE LVEF 55 to 60%, LV hypertrophy, severe  pulmonary hypertension, mitral valve regurgitation and tricuspid valve regurgitation moderate to severe. Venous duplex b/l lower extremity negative for DVT Vascular surgery consulted, recommended continue IV heparin infusion, optimize CHF and may need to repeat CTA for clot burden to decide for thrombectomy. 2/18 Vascular surgery recommended cardiology consult for complete cardiac workup 2/19 CTA chest shows improvement in the bilateral PE, no significant right ventricular strain due to submassive PE. 2/20 s/p heparin gtt., transition to Eliquis 10 mg p.o. twice daily for 7 days followed by 5 mg p.o. twice daily D/w vascular surgery, recommended no intervention, continue anticoagulation.   # Acute on chronic diastolic CHF exacerbation Presented with pulmonary edema and bilateral lower extremity edema S/p Lasix IV 60 mg x 1 dose given in the ED S/p Lasix IV infusion DC'd on 2/17, started Lasix 20 mg IV twice daily  2/17 BP was soft so started midodrine 5 mg p.o. 3 times daily Troponin slightly elevated most likely demand ischemia, patient denies any chest pain TTE as above Continue to monitor BP and titrate medication accordingly 2/19 CTA shows significant pleural effusion and pulmonary edema, increased Lasix from 20 to 40 mg IV twice daily    # Hypokalemia, potassium repleted. Monitor electrolytes and replete as needed.   # AKI, possible cardiorenal syndrome Monitor urine output and renal functions daily Insert Foley catheter for close urine output monitoring Avoid nephrotoxic medications, use renally dose medications US renal: No collecting system dilatation.  Bilateral renal cysts. Ascites. Cr 1.83--1.56--1.28 --0.92, AKI resolved   # Hypertension, A- flutter s/p ablation Held Lopressor due to low blood pressure 2/20 started Eliquis 10 mg p.o. BID x 7 days, followed by 5 mg  p.o. BID  S/p heparin IV infusion due to acute PE 2/17 started midodrine 5 mg p.o. 3 times daily with  holding parameters due to low BP Monitor BP and titrate medications accordingly Continue to monitor on telemetry   # Vision loss in the left eye, most likely due to cataract Patient is poor historian, patient wife mentioned that patient is having gradual decrease in the vision in the left eye possible due to cataract.  She called ophthalmology appointment which is after few weeks.  No any new neurological changes, no any acute vision loss, less likely stroke.  We will continue to monitor.   Overall poor prognosis due to advanced age, palliative care consulted for goals of care discussion with family.  CODE STATUS was changed to DNR/DNI on 09/13/2022  Body mass index is 19.98 kg/m. Interventions:     Diet: Heart healthy diet, fluid striction 1.5 L/day DVT Prophylaxis: Therapeutic Anticoagulation with Eliquis    Advance goals of care discussion: DNR  Family Communication: family was present at bedside, at the time of interview.  The pt provided permission to discuss medical plan with the family. Opportunity was given to ask question and all questions were answered satisfactorily.   Disposition:  Pt is from Home, admitted with acute pulmonary embolism, respiratory failure, diastolic CHF, s/p IV heparin gtt, transition to DOAC, plan for discharge tomorrow a.m. Discharge to TBD, needs PT and OT eval, when clinically stable may need few days to improve.  Subjective: No significant events overnight, patient was sitting in the recliner, stated that he did walk yesterday in the hallway.  No respiratory distress, denies any chest pain or palpitations, no any other active issues. [name] was at bedside, discussed management plan, we will plan to discharge him tomorrow a.m.   Physical Exam: General: NAD, lying comfortably Appear in no distress, affect appropriate Eyes: PERRLA, very poor vision in the left eye most likely due to cataract ENT: Oral Mucosa Clear, moist, b/l SNHL Neck: no  JVD,  Cardiovascular: S1 and S2 Present, Murmur audible,  Respiratory: good respiratory effort, Bilateral Air entry equal, mild bibasilar crackles, no wheezes  Abdomen: Bowel Sound present, Soft and no tenderness,  Skin: no rashes Extremities: No calf tenderness.  Edema resolved Neurologic: without any new focal findings Gait not checked due to patient safety concerns  Vitals:   09/14/22 0459 09/14/22 0849 09/14/22 1132 09/14/22 1225  BP:  109/79 113/63   Pulse:  77 86 91  Resp:  18 18   Temp:  (!) 97.5 F (36.4 C) 97.9 F (36.6 C)   TempSrc:   Oral   SpO2:  98% 99% 94%  Weight: 56.2 kg     Height:        Intake/Output Summary (Last 24 hours) at 09/14/2022 1348 Last data filed at 09/14/2022 1134 Gross per 24 hour  Intake 499.18 ml  Output 1200 ml  Net -700.82 ml   Filed Weights   09/09/22 1352 09/10/22 2224 09/14/22 0459  Weight: 69.4 kg 61.5 kg 56.2 kg    Data Reviewed: I have personally reviewed and interpreted daily labs, tele strips, imagings as discussed above. I reviewed all nursing notes, pharmacy notes, vitals, pertinent old records I have discussed plan of care as described above with RN and patient/family.  CBC: Recent Labs  Lab 09/10/22 0441 09/11/22 0402 09/12/22 0729 09/13/22 0629 09/14/22 0330  WBC 5.6 5.8 5.2 5.3 5.9  HGB 11.9* 12.3* 12.0* 12.9* 13.1  HCT 37.1* 37.2* 36.2* 39.4  40.1  MCV 105.4* 103.6* 103.1* 103.4* 102.3*  PLT 125* 112* 105* 105* [id number]*   Basic Metabolic Panel: Recent Labs  Lab 09/10/22 0441 09/11/22 0402 09/12/22 0729 09/13/22 0629 09/14/22 0330  NA 133* 137 136 138 136  K 4.3 3.6 3.2* 4.2 3.7  CL 98 98 98 100 98  CO2 26 32 31 31 29  $ GLUCOSE 91 84 89 86 86  BUN 48* 37* 29* 30* 27*  CREATININE 1.56* 1.28* 1.07 0.92 0.97  CALCIUM 8.3* 8.8* 8.2* 8.5* 8.5*  MG 2.2 1.9 1.7  --   --   PHOS 4.6 3.1 2.7  --   --     Studies: No results found.  Scheduled Meds:  apixaban  10 mg Oral BID   Followed by   Derrill Memo ON  09/21/2022] apixaban  5 mg Oral BID   feeding supplement  237 mL Oral TID BM   [START ON 09/15/2022] furosemide  40 mg Oral Daily   loratadine  10 mg Oral Daily   midodrine  5 mg Oral TID WC   rOPINIRole  0.25 mg Oral QHS   sodium chloride flush  3 mL Intravenous Q12H   Continuous Infusions:  sodium chloride     PRN Meds: sodium chloride, acetaminophen **OR** acetaminophen, sodium chloride flush  Time spent: 50 minutes  Author: Val Riles. MD [facility] 09/14/2022 1:48 PM  To reach On-call, see care teams to locate the attending and reach out to them via [email or web address]. If 7PM-7AM, please contact night-coverage If you still have difficulty reaching the attending provider, please page the [facility] (Director on Call) for [facility] on amion for assistance.

## 2022-09-14 NOTE — Progress Notes (Signed)
Spoke with patient and patient's family member. Told them the cost of the apixaban will be $388. I offered to have the medications filled at our pharmacy to easy discharge. Patient declined and want the medication sent to [facility]. They stated that they can afford the medication. Patient was educated about dosing, side effects, and was given a handout with all of that information on it. Patient already has used the Free 30-day coupon and will not be able to use it again. They are aware.   Thanks,  Eleonore Chiquito, PharmD, BCPS

## 2022-09-14 NOTE — Consult Note (Signed)
ANTICOAGULATION CONSULT NOTE  Pharmacy Consult for heparin infusion Indication: pulmonary embolus  Allergies  Allergen Reactions   Penicillins Anaphylaxis    Patient Measurements: Height: 5' 6"$  (167.6 cm) Weight: 61.5 kg (135 lb 9.3 oz) IBW/kg (Calculated) : 63.8 Heparin Dosing Weight: 69.4 kg  Vital Signs: Temp: 97.8 F (36.6 C) (02/20 0105) Temp Source: Oral (02/20 0105) BP: 115/70 (02/20 0333) Pulse Rate: 75 (02/20 0333)  Labs: Recent Labs    09/11/22 2250 09/12/22 0729 09/12/22 0729 09/13/22 0629 09/13/22 1723 09/14/22 0330  HGB  --  12.0*   < > 12.9*  --  13.1  HCT  --  36.2*  --  39.4  --  40.1  PLT  --  105*  --  105*  --  103*  APTT 79* 92*  --  88*  --   --   HEPARINUNFRC  --  0.28*   < > 0.17* 0.25* 0.63  CREATININE  --  1.07  --  0.92  --  0.97   < > = values in this interval not displayed.     Estimated Creatinine Clearance: 39.6 mL/min (by C-G formula based on SCr of 0.97 mg/dL).   Medical History: Past Medical History:  Diagnosis Date   A-fib [facility])    a.) CHA2DS2-VASc = 4 (age x 2, CHF, aortic plaque). b.) s/p unsuccessful TEE cardioversion 04/28/2016 --> rec'd 200 J x1 and 360 J x 2. c.) rate/rhythm maintained on oral metoprolol succinante; chronically anticoagulated using dose reduced apixaban.   Anemia    Aortic atherosclerosis (HCC)    Arthritis    CHF (congestive heart failure) ([place])    a.) TTE 04/26/2016: EF >55%; mod LVH; MVP; mild-mod LAE, mild MR. b.) TTE 05/14/209: EF >55%; mod RVE; mild BAE; mod MR.TR; triv AR/PR. c.) TTE 03/28/2019: EF >55%; mild LVH; mod BAE; mild PR, mod MR/TR.   GERD (gastroesophageal reflux disease)    Heart murmur    History of kidney stones    HOH (hard of hearing)    a.) hearing aid to LEFT ear.   Hyperlipidemia    Infrarenal abdominal aortic aneurysm (AAA) without rupture ([place]) 05/05/2021   a.) CT 05/05/2021 --> measured 3.0 cm.   MVP (mitral valve prolapse)    Peripheral edema    Pneumonia     Pulmonary HTN ([place]) 04/26/2016   a.) TTE 04/26/2016: EF >55%; PASP 45. b.) TTE 12/06/2017: EF >55%; RVSP 76.6 mmHg. c.) TTE 03/28/2019: EF >55%; RVSP 77.8 mmHg.   Renal cell carcinoma (HCC)    Rheumatic fever    Sepsis ([place])    Valvular heart disease     Medications:  PTA: apixaban 2.5 mg; last dose 2/15 AM   Assessment: 89 y.o. male  who presents to the emergency department today because of leg swelling and SOB. Patient with history of afib on apixaban (reduce dose, body weight borderline). On CT found to have acute PE with evidence of right heart strain. Pharmacy has been consulted to initaite and manage IV heparin infusion.  Goal of Therapy:  Heparin level 0.3-0.7 units/ml aPTT 66-102 seconds Monitor platelets by anticoagulation protocol: Yes   Plan: heparin level therapeutic x 1 ---Will continue heparin infusion at 950 units/hr ---Recheck heparin level in 8 hours to confirm ---CBC daily while on heparin.   Renda Rolls, PharmD, [facility] 09/14/2022 4:33 AM

## 2022-09-14 NOTE — Progress Notes (Signed)
Daily Progress Note   Patient Name: Marcus Kemp       Date: 09/14/2022 DOB: 24-Feb-1925  Age: 89 y.o. MRN#: [id number] Attending Physician: Val Riles, MD Primary Care Physician: Idelle Crouch, MD Admit Date: 09/09/2022  Reason for Consultation/Follow-up: Establishing goals of care  HPI/Brief Hospital Review: 89 y.o. 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 Admitted on 09/09/2022 with complaints of increased weakness and BLE edema persisting for 1 week. In ED, found to have AKI and elevated BNP. CXR revealing pulmonary edema.   Elevated D-dimer. CTA revealing PE with associated right heart strain. Also noted, hepatic cirrhosis with upper abdominal ascites.   TTE LVEF 50-60%, moderate LV hypertrophy, moderate-severe MR and TR   Remains of heparin infusion Awaiting optimization of CHF prior to pursuing possible further interventions for PE   Palliative medicine was consulted for assisting with goals of care conversations.  Subjective: Extensive chart review has been completed prior to meeting patient including labs, vital signs, imaging, progress notes, orders, and available advanced directive documents from current and previous encounters.    Visited with Marcus Kemp at his bedside. Sitting up in chair, awake and alert. Reports feeling better this morning. Complains of restless leg earlier this morning that kept him up most of the night. Explain primary team has ordered a medication to help alleviate his symptoms.  Wife at bedside. She explains her understanding of plan of care. Likely transitioning off of IV Heparin later  today-still awaiting final decision to be made regarding interventions needed for possible thrombectomy. Encouraged Marcus Kemp to ambulate again today to help with increased strength and mobility.  Goals remain clear with his hopes being to return home and return to his previous activities of working around his home and enjoying time with his family.  PMT to continue to follow for ongoing support and needs.  Objective:  Vital Signs: BP 113/63 (BP Location: Right Arm)   Pulse 86   Temp 97.9 F (36.6 C) (Oral)   Resp 18   Ht 5' 6"$  (1.676 m)   Wt 56.2 kg   SpO2 99%   BMI 19.98 kg/m  SpO2: SpO2: 99 % O2 Device: O2 Device: Room [employer]  O2 Flow Rate: O2 Flow Rate (L/min): 1 L/min   Palliative Care Assessment & Plan   Assessment/Recommendation/Plan  DNR Continue full scope of treatment PMT to continue to follow for ongoing support and needs   Thank you for allowing the Palliative Medicine Team to assist in the care of this patient.  Total time:  25 minutes  Greater than 50%  of this time was spent counseling and coordinating care related to the above assessment and plan.  Theodoro Grist, DNP, AGNP-C Palliative Medicine   Please contact Palliative Medicine Team phone at [phone] for questions and concerns.

## 2022-09-14 NOTE — Evaluation (Signed)
Physical Therapy Evaluation Patient Details Name: Marcus Kemp MRN: [id number] DOB: 1924-10-15 Today's Date: 09/14/2022  History of Present Illness  Marcus Kemp is a 65yoM who comes to [facility] with dyspnea, found to be in volume overload and with acute PE. Pt anticoagulated on heparin, then transitioned to PO meds for home. At baseline, lives at home with wife, is a household AMB c SPC at baseline.  Clinical Impression  Pt in recliner on entry, reports to be feeling somewhat better. He no longer requires supplemental O2, no longer has SOB with mobility. He reports AMB with hallway previous day with RW and NSG. Pt excited to mobilize this date, as he is typically quite active throughout the day and his buttocks is sore from sitting today. Pt performs 4x STS transfers from recliner with [place]. He AMB 233f in room with SPC and 7+ turnarounds, slight excessive sway at times with self recovery. Pt then AMB [id number] hallway at his request as he desired to mobilize and remain in standing for longer. Pt reports his leg strength and breathing to be near baseline, perhaps is slightly more unsteady than baseline, is agreeable that using a RW at DC for a short time would offer an additional layer of protection. Pt and wife agreeable that HHPT services would be of benefit. Will continue to follow. VSS post AMB.      Recommendations for follow up therapy are one component of a multi-disciplinary discharge planning process, led by the attending physician.  Recommendations may be updated based on patient status, additional functional criteria and insurance authorization.  Follow Up Recommendations Home health PT      Assistance Recommended at Discharge Set up Supervision/Assistance  Patient can return home with the following  A little help with walking and/or transfers;A little help with bathing/dressing/bathroom;Assistance with cooking/housework    Equipment Recommendations Rolling walker (2 wheels)   Recommendations for Other Services       Functional Status Assessment Patient has had a recent decline in their functional status and demonstrates the ability to make significant improvements in function in a reasonable and predictable amount of time.     Precautions / Restrictions Precautions Precautions: Fall Restrictions Weight Bearing Restrictions: No      Mobility  Bed Mobility               General bed mobility comments: in recliner on arrival    Transfers Overall transfer level: Needs assistance Equipment used: Straight cane Transfers: Sit to/from Stand Sit to Stand: Min guard           General transfer comment: no LOB, albeit delayed aquisition of balance    Ambulation/Gait   Gait Distance (Feet): 240 Feet (precceeded by 20040fn room) Assistive device: Straight cane Gait Pattern/deviations: WFL(Within Functional Limits)       General Gait Details: mild sway increases, uses SPC for balance use of wall twice, subjectively near baseline for AMB ability  Stairs            Wheelchair Mobility    Modified Rankin (Stroke Patients Only)       Balance Overall balance assessment: Modified Independent                                           Pertinent Vitals/Pain Pain Assessment Pain Assessment: No/denies pain    Home Living Family/patient expects to be discharged to:: Private  residence Living Arrangements: Spouse/significant other   Type of Home: House Home Access: Stairs to enter   [employer] of Steps: 2 steps into sunken den (railing available)   Home Layout: One level Home Equipment: Crutches;Cane - single point      Prior Function Prior Level of Function : Independent/Modified Independent                     Hand Dominance        Extremity/Trunk Assessment                Communication      Cognition Arousal/Alertness: Awake/alert Behavior During Therapy: WFL for tasks  assessed/performed Overall Cognitive Status: Within Functional Limits for tasks assessed                                          General Comments      Exercises General Exercises - Lower Extremity Long Arc Quad: AROM, Both, 15 reps, Seated   Assessment/Plan    PT Assessment Patient needs continued PT services  PT Problem List Decreased activity tolerance;Decreased balance;Decreased range of motion;Decreased mobility;Decreased cognition       PT Treatment Interventions DME instruction;Gait training;Stair training;Functional mobility training;Therapeutic activities;Therapeutic exercise;Balance training;Neuromuscular re-education;Patient/family education    PT Goals (Current goals can be found in the Care Plan section)  Acute Rehab PT Goals Patient Stated Goal: ready to go home PT Goal Formulation: With patient Time For Goal Achievement: 09/28/22 Potential to Achieve Goals: Good    Frequency Min 2X/week     Co-evaluation               AM-PAC PT "6 Clicks" Mobility  Outcome Measure Help needed turning from your back to your side while in a flat bed without using bedrails?: A Lot Help needed moving from lying on your back to sitting on the side of a flat bed without using bedrails?: A Lot Help needed moving to and from a bed to a chair (including a wheelchair)?: A Little Help needed standing up from a chair using your arms (e.g., wheelchair or bedside chair)?: A Little Help needed to walk in hospital room?: A Little Help needed climbing 3-5 steps with a railing? : A Little 6 Click Score: 16    End of Session Equipment Utilized During Treatment: Gait belt Activity Tolerance: Patient tolerated treatment well;No increased pain Patient left: in chair;with family/visitor present;with call bell/phone within reach Nurse Communication: Mobility status PT Visit Diagnosis: Unsteadiness on feet (R26.81);Difficulty in walking, not elsewhere classified  (R26.2);Other abnormalities of gait and mobility (R26.89)    Time: [id number] PT Time Calculation (min) (ACUTE ONLY): 24 min   Charges:   PT Evaluation $PT Eval Moderate Complexity: 1 Mod PT Treatments $Therapeutic Activity: 8-22 mins       12:44 PM, 09/14/22 Etta Grandchild, PT, DPT Physical Therapist - [facility]  [phone] ([place])    Ariz Terrones C 09/14/2022, 12:40 PM

## 2022-09-14 NOTE — Progress Notes (Signed)
Progress Note    09/14/2022 1:06 PM * No surgery found *  Subjective:   Subjective:  [name] is a 89 year old male past history of grade 2 diastolic dysfunction mitral valvular disease with prolapse history of hypertension atrial flutter status post ablation.  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 Patient had bilateral lower extremity edema over the past 3 days and developed what appeared to be acute pulmonary emboli based on imaging. To proceed with intervention because of RV strain cardiac clearance was needed. The patient denies any chest pain shortness of breath, denies any palpitations or tachycardia he is only had some recent urinary symptoms.   Today on exam he is sitting in bedside chair resting comfortably with his wife at the bedside. It appears he has diuresed 8 Liters over the past 5 days and now is weaned off oxygen while resting with oxygen saturations of 95-96%. He has not ambulated without oxygen. The heparin infusion was stopped today and he was transitioned back to his eliquis 2.5 mg bid by mouth.    According to family (wife) patient was on 5 mg eliquis bid in the past and had multiple problems with GI Bleeding.       Vitals:   09/14/22 1132 09/14/22 1225  BP: 113/63   Pulse: 86 91  Resp: 18   Temp: 97.9 F (36.6 C)   SpO2: 99% 94%   Physical Exam: Cardiac:  RRR S1 and S2 without murmurs.  Lungs: Clear throughout on auscultation with slight rales in the bases bilaterally   Incisions:  N/A Extremities:  Bilateral lower extremity edema +1. Unable to palpate pulses  Abdomen:   Positive bowel sounds, soft, flat, non tender, non distended.   Neurologic: AAOX3, Neuro intact, follows commands, answers all questions appropriately.   CBC    Component Value Date/Time   WBC 5.9 09/14/2022 0330   RBC 3.92 (L) 09/14/2022 0330   HGB 13.1 09/14/2022 0330   HCT 40.1 09/14/2022 0330   PLT 103 (L)  09/14/2022 0330   MCV 102.3 (H) 09/14/2022 0330   MCH 33.4 09/14/2022 0330   MCHC 32.7 09/14/2022 0330   RDW 17.4 (H) 09/14/2022 0330   LYMPHSABS 1.2 01/27/2022 1520   MONOABS 0.6 01/27/2022 1520   EOSABS 0.1 01/27/2022 1520   BASOSABS 0.1 01/27/2022 1520    BMET    Component Value Date/Time   NA 136 09/14/2022 0330   K 3.7 09/14/2022 0330   CL 98 09/14/2022 0330   CO2 29 09/14/2022 0330   GLUCOSE 86 09/14/2022 0330   BUN 27 (H) 09/14/2022 0330   CREATININE 0.97 09/14/2022 0330   CALCIUM 8.5 (L) 09/14/2022 0330   GFRNONAA >60 09/14/2022 0330   GFRAA >60 05/10/2016 1800    INR No results found for: "INR"   Intake/Output Summary (Last 24 hours) at 09/14/2022 1306 Last data filed at 09/14/2022 1134 Gross per 24 hour  Intake 499.18 ml  Output 2200 ml  Net -1700.82 ml  EXAM: CT ANGIOGRAPHY CHEST WITH CONTRAST  IMPRESSION: 1. Evidence of probable residual pulmonary emboli in the posterior basilar branches of the right lower lobe pulmonary artery and improved appearance of left lower lobe pulmonary emboli. No new or progressive pulmonary emboli are identified. 2. Evidence of cardiomyopathy with right-sided chamber and left atrial dilatation. Estimation of right heart strain by CTA is not pertinent due to lack of submassive pulmonary embolism. 3. Coronary atherosclerosis. 4. Slight increase in volume of bilateral  pleural effusions with associated increase in bibasilar atelectasis, especially on the left. 5. Probable component of worsening pulmonary interstitial edema in the lungs bilaterally without overt airspace edema. 6. Aortic atherosclerosis without evidence of aneurysmal disease.   Assessment/Plan:  89 y.o. male who was admitted to the hospital for an exacerbation of CHF with pulmonary embolism.  * No surgery found *   PLAN: CT Chest was repeated yesterday. Findings are better than when patient was admitted. Improved appearance of left lower lobe pulmonary  emboli. No new or progressive pulmonary emboli are identified. Estimation of right heart strain by CTA is not pertinent due to lack of submassive pulmonary embolism. Therefore Vascular Surgery does not see a need for any intervention at this time. Convert patient off heparin infusion and back to his Eliquis 2.5 mg BID for anticoagulation needs.  Vascular Surgery is okay with patient discharge by primary team when deemed appropriate.  Vascular Surgery signs off the case at this time. If needed again please consult [place] again and we will gladly see the patient.   DVT prophylaxis:  Eliquis 2.5 mg BID   Drema Pry [facility] 09/14/2022 1:06 PM

## 2022-09-14 NOTE — Evaluation (Signed)
Occupational Therapy Evaluation Patient Details Name: Marcus Kemp MRN: [id number] DOB: Mar 21, 1925 Today's Date: 09/14/2022   History of Present Illness Marcus Kemp is a 41yoM who comes to [facility] with dyspnea, found to be in volume overload and with acute PE. Pt anticoagulated on heparin, then transitioned to PO meds for home. At baseline, lives at home with wife, is a household AMB c SPC at baseline.   Clinical Impression   Patient seen for OT evaluation, spouse present. Very HOH (has hearing aids). Pt presenting with decreased independence in self care, balance, functional mobility/transfers, and endurance. PTA pt was Mod I for ADLs/IADLs with spouse assisting as needed. Pt currently functioning at [place] guard to stand from recliner and for functional mobility to the bathroom with SPC 2/2 unsteadiness. He completed toilet transfer with Min guard from lower transfer surface and seated LB dressing with set up-supervision. Pt will benefit from acute OT to increase overall independence in the areas of ADLs and functional mobility in order to safely discharge home. Pt could benefit from [facility] following D/C to decrease falls risk, improve balance, and maximize independence in self-care within own home environment.     Recommendations for follow up therapy are one component of a multi-disciplinary discharge planning process, led by the attending physician.  Recommendations may be updated based on patient status, additional functional criteria and insurance authorization.   Follow Up Recommendations  Home health OT     Assistance Recommended at Discharge Intermittent Supervision/Assistance  Patient can return home with the following A little help with walking and/or transfers;A little help with bathing/dressing/bathroom;Assistance with cooking/housework;Assist for transportation;Help with stairs or ramp for entrance    Functional Status Assessment  Patient has had a recent decline in their  functional status and demonstrates the ability to make significant improvements in function in a reasonable and predictable amount of time.  Equipment Recommendations  None recommended by OT    Recommendations for Other Services       Precautions / Restrictions Precautions Precautions: Fall Restrictions Weight Bearing Restrictions: No      Mobility Bed Mobility               General bed mobility comments: NT, received/left in recliner    Transfers Overall transfer level: Needs assistance Equipment used: Straight cane Transfers: Sit to/from Stand Sit to Stand: Min guard                  Balance Overall balance assessment: Needs assistance Sitting-balance support: Feet supported Sitting balance-Leahy Scale: Good     Standing balance support: Single extremity supported, During functional activity Standing balance-Leahy Scale: Fair         ADL either performed or assessed with clinical judgement   ADL Overall ADL's : Needs assistance/impaired                     Lower Body Dressing: Set up;Supervision/safety;Sitting/lateral leans Lower Body Dressing Details (indicate cue type and reason): socks Toilet Transfer: Min guard;Regular Toilet;Grab bars   Toileting- Clothing Manipulation and Hygiene: Min guard;Minimal assistance;Sit to/from stand       Functional mobility during ADLs: Min guard;Cane (~25 ft to the bathroom)       Vision Patient Visual Report: No change from baseline       Perception     Praxis      Pertinent Vitals/Pain Pain Assessment Pain Assessment: No/denies pain     Hand Dominance     Extremity/Trunk Assessment Upper Extremity Assessment  Upper Extremity Assessment: Generalized weakness   Lower Extremity Assessment Lower Extremity Assessment: Generalized weakness       Communication Communication Communication: HOH (hearing aids present)   Cognition Arousal/Alertness: Awake/alert Behavior During Therapy:  WFL for tasks assessed/performed Overall Cognitive Status: Within Functional Limits for tasks assessed                 General Comments       Exercises Other Exercises Other Exercises: OT provided education re: role of OT, OT POC, post acute recs, sitting up for all meals, EOB/OOB mobility with assistance, home/fall safety.      Shoulder Instructions      Home Living Family/patient expects to be discharged to:: Private residence Living Arrangements: Spouse/significant other Available Help at Discharge: Family;Available 24 hours/day Type of Home: House Home Access: Stairs to enter [employer] of Steps: 2 steps into sunken den (railing available)   Home Layout: One level     Bathroom Shower/Tub: Occupational psychologist: Standard     Home Equipment: Crutches;Cane - single point;Grab bars - tub/shower          Prior Functioning/Environment Prior Level of Function : Independent/Modified Independent               ADLs Comments: Mod I for ADLs. Wife assists as needed.        OT Problem List: Decreased strength;Decreased range of motion;Decreased activity tolerance;Impaired balance (sitting and/or standing);Decreased safety awareness      OT Treatment/Interventions: Self-care/ADL training;Therapeutic exercise;Therapeutic activities;Energy conservation;DME and/or AE instruction;Patient/family education;Balance training    OT Goals(Current goals can be found in the care plan section) Acute Rehab OT Goals Patient Stated Goal: return home OT Goal Formulation: With patient/family Time For Goal Achievement: 09/28/22 Potential to Achieve Goals: Good   OT Frequency: Min 2X/week    Co-evaluation              AM-PAC OT "6 Clicks" Daily Activity     Outcome Measure Help from another person eating meals?: None Help from another person taking care of personal grooming?: A Little Help from another person toileting, which includes using  toliet, bedpan, or urinal?: A Little Help from another person bathing (including washing, rinsing, drying)?: A Little Help from another person to put on and taking off regular upper body clothing?: None Help from another person to put on and taking off regular lower body clothing?: A Little 6 Click Score: 20   End of Session Equipment Utilized During Treatment: Gait belt;Other (comment) [facility]) Nurse Communication: Mobility status  Activity Tolerance: Patient tolerated treatment well Patient left: in chair;with call bell/phone within reach;with chair alarm set;with family/visitor present  OT Visit Diagnosis: Unsteadiness on feet (R26.81);Muscle weakness (generalized) (M62.81)                Time: [id number] OT Time Calculation (min): 15 min Charges:  OT General Charges $OT Visit: 1 Visit OT Evaluation $OT Eval Low Complexity: 1 Low  [facility] MS, OTR/L ascom [phone]  09/14/22, 6:03 PM

## 2022-09-14 NOTE — Telephone Encounter (Signed)
Pharmacy Patient Advocate Encounter  Insurance verification completed.    The patient is insured through [employer]   The patient is currently admitted and ran test claims for the following: Eliquis.  Copays and coinsurance results were relayed to Inpatient clinical team.

## 2022-09-14 NOTE — TOC Benefit Eligibility Note (Signed)
Patient Teacher, English as a foreign language completed.    The patient is currently admitted and upon discharge could be taking Eliquis.  The current 30 day co-pay is $390.24 due to deductible.   The patient is insured through [employer]

## 2022-09-15 DIAGNOSIS — I2609 Other pulmonary embolism with acute cor pulmonale: Secondary | ICD-10-CM | POA: Diagnosis not present

## 2022-09-15 LAB — CBC
HCT: 37.6 % — ABNORMAL LOW (ref 39.0–52.0)
Hemoglobin: 12.3 g/dL — ABNORMAL LOW (ref 13.0–17.0)
MCH: 33.5 pg (ref 26.0–34.0)
MCHC: 32.7 g/dL (ref 30.0–36.0)
MCV: 102.5 fL — ABNORMAL HIGH (ref 80.0–100.0)
Platelets: 96 10*3/uL — ABNORMAL LOW (ref 150–400)
RBC: 3.67 MIL/uL — ABNORMAL LOW (ref 4.22–5.81)
RDW: 17.1 % — ABNORMAL HIGH (ref 11.5–15.5)
WBC: 4.7 10*3/uL (ref 4.0–10.5)
nRBC: 0 % (ref 0.0–0.2)

## 2022-09-15 LAB — BASIC METABOLIC PANEL WITH GFR
Anion gap: 9 (ref 5–15)
BUN: 22 mg/dL (ref 8–23)
CO2: 28 mmol/L (ref 22–32)
Calcium: 8.8 mg/dL — ABNORMAL LOW (ref 8.9–10.3)
Chloride: 98 mmol/L (ref 98–111)
Creatinine, Ser: 0.8 mg/dL (ref 0.61–1.24)
GFR, Estimated: >60 mL/min
Glucose, Bld: 85 mg/dL (ref 70–99)
Potassium: 3.7 mmol/L (ref 3.5–5.1)
Sodium: 135 mmol/L (ref 135–145)

## 2022-09-15 MED ORDER — FUROSEMIDE 20 MG PO TABS: 40.0000 mg | ORAL_TABLET | ORAL | 2 refills | Status: DC

## 2022-09-15 MED ORDER — ROPINIROLE HCL 0.25 MG PO TABS: 0.2500 mg | ORAL_TABLET | Freq: Every day | ORAL | 2 refills | Status: DC

## 2022-09-15 MED ORDER — MIDODRINE HCL 5 MG PO TABS: 5.0000 mg | ORAL_TABLET | Freq: Three times a day (TID) | ORAL | 0 refills | Status: AC | PRN

## 2022-09-15 NOTE — Discharge Summary (Signed)
[facility] Discharge Summary   Patient: Marcus Kemp [id number]  PCP: Idelle Crouch, MD  Date of admission: 09/09/2022   Date of discharge:  09/15/2022     Discharge Diagnoses:  Principal Problem:   Acute pulmonary embolism with acute cor pulmonale ([place]) Active Problems:   CHF (congestive heart failure) ([place])   Acute respiratory failure with hypoxia ([place])   Admitted From: Home Disposition:  Home with HHPT/OT/RN/AID  Recommendations for Outpatient Follow-up:  Follow-up with PCP in 1 week Follow-up with cardiology in 1 to 2 weeks for diastolic CHF Follow up LABS/TEST:     Follow-up Information     Clabe Seal, PA-C. Schedule an appointment as soon as possible for a visit in 1 week(s).   Why: Clabe Seal will call you to schedule this follow up appointment. If you have not heard from them within 48 hours, please call them at the number listed. Contact information: [place] [address] [phone]                Diet recommendation: Carb modified diet  Activity: The patient is advised to gradually reintroduce usual activities, as tolerated  Discharge Condition: stable  Code Status: DNR   History of present illness: As per the H and P dictated on admission, Hospital Course:  Marcus Kemp is a 89 y.o. 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  Patient has sensorineural deafness.  Denies any chest pain, no palpitations, no abdominal pain. ED Course:  creatinine 1.83, AKI, BNP 1637 very elevated, troponin 49--48 most likely due to demand ischemia, hemoglobin 12.3, elevated MCV.  CXR consistent with pulmonary edema. Patient was given Lasix IV in the ED and [facility] hospitalist  consulted for further management   Assessment and Plan: # Acute hypoxic respiratory failure 2/2 acute PE, pul. edema, and dCHF exacerbation.Patient was cyanotic on exam, patient was placed on nonrebreather. ABG consistent with hypoxia, Critical care was consulted, pt did not require any intervention. D-dimer elevated, CTA positive for PE. TTE LVEF 50 to 60% no WMA, mod LV hypertrophy, mod-severe MR and TR. On 2/20 Oxygen weaned off, respiratory failure resolved, currently patient is saturating well on room air.  # Acute pulmonary embolism, D-dimer elevated,  CTA positive for PE with right heart strain RV:LV 1.8, Cardiac enlargement with bilateral pleural effusions and probable interstitial edema. Probable hepatic cirrhosis with upper abdominal ascites. 6 mm nonobstructing stones seen in the right kidney. TTE LVEF 55 to 60%, LV hypertrophy, severe pulmonary hypertension, mitral valve regurgitation and tricuspid valve regurgitation moderate to severe. Venous duplex b/l lower extremity negative for DVT Vascular surgery consulted, recommended continue IV heparin infusion, optimize CHF and may need to repeat CTA for clot burden to decide for thrombectomy. On 2/18 Vascular surgery recommended cardiology consult for complete cardiac workup. 2/19 CTA chest shows improvement in the bilateral PE, no significant right ventricular strain due to submassive PE. 2/20 s/p heparin gtt., transition to Eliquis 10 mg p.o. twice daily for 7 days followed by 5 mg p.o. twice daily. D/w vascular surgery, recommended no intervention, continue anticoagulation.  Patient ambulated well in the hallway without any acute distress, seems stable to discharge.  Patient's family agreed with the discharge planning. # Acute on chronic diastolic  CHF exacerbation Presented with pulmonary edema and bilateral lower extremity edema S/p Lasix IV 60 mg x 1 dose given in the ED. S/p Lasix IV infusion DC'd on 2/17, started Lasix 20 mg IV twice  daily. On 2/17 BP was soft so started midodrine 5 mg p.o. 3 times daily. Troponin slightly elevated most likely demand ischemia, patient denies any chest pain. TTE as above 2/19 CTA shows significant pleural effusion and pulmonary edema, increased Lasix from 20 to 40 mg IV twice daily.  Resumed home dose Lasix on discharge. Lower extremity edema and shortness of breath resolved. # Hypokalemia, potassium repleted.  Resolved  # AKI, possible cardiorenal syndrome US renal: No collecting system dilatation.  Bilateral renal cysts. Ascites. Cr 1.83--1.56--1.28 --0.92, AKI resolved # Hypertension, A- flutter s/p ablation: Held Lopressor due to low blood pressure. 2/20 started Eliquis 10 mg p.o. BID x 7 days, followed by 5 mg p.o. BID. S/p heparin IV infusion due to acute PE On 2/17 started midodrine 5 mg p.o. 3 times daily with holding parameters due to low BP.  Resumed Toprol-XL 25 mg p.o. daily on discharge and prescribed midodrine 5 mg p.o. 3 times daily as needed if SBP less than 100 mmHg.  Patient and family was advised to monitor BP at home and follow with PCP to titrate medications accordingly. # Vision loss in the left eye, most likely due to cataract Patient is poor historian, patient wife mentioned that patient is having gradual decrease in the vision in the left eye possible due to cataract. She called ophthalmology appointment which is after few weeks.  No any new neurological changes, no any acute vision loss, less likely stroke.     Overall poor prognosis due to advanced age, palliative care consulted for goals of care discussion with family.  CODE STATUS was changed to DNR/DNI on 09/13/2022  Body mass index is 20.67 kg/m.  Nutrition Interventions:  Patient was seen by physical therapy, who recommended Home health, which was arranged. On the day of the discharge the patient's vitals were stable, and no other acute medical condition were reported by patient. the patient was felt safe to be  discharge at Home with Home health.  Consultants: Cardiologist, vascular surgery and palliative care Procedures: None  Discharge Exam: General: Appear in no distress, no Rash; Oral Mucosa Clear, moist. Cardiovascular: S1 and S2 Present, no Murmur, Respiratory: normal respiratory effort, Bilateral Air entry present and no Crackles, no wheezes Abdomen: Bowel Sound present, Soft and no tenderness, no hernia Extremities: no Pedal edema, no calf tenderness Neurology: alert and oriented to time, place, and person affect appropriate.  Filed Weights   09/10/22 2224 09/14/22 0459 09/15/22 0339  Weight: 61.5 kg 56.2 kg 58.1 kg   Vitals:   09/15/22 0328 09/15/22 0831  BP: 111/67 113/74  Pulse: 78 80  Resp: 20 18  Temp: 98 F (36.7 C) 97.6 F (36.4 C)  SpO2: 95% 96%    DISCHARGE MEDICATION: Allergies as of 09/15/2022       Reactions   Penicillins Anaphylaxis        Medication List     STOP taking these medications    apixaban 2.5 MG Tabs tablet Commonly known as: ELIQUIS Replaced by: Apixaban Starter Pack (69m and 564m   OVER THE COUNTER MEDICATION   OVER THE COUNTER MEDICATION       TAKE these medications    acetaminophen 500 MG tablet Commonly known as: TYLENOL Take 1,000 mg by mouth at bedtime.  Apixaban Starter Pack (41m and 560m Commonly known as: ELIQUIS STARTER PACK Take as directed on package: start with two-41m46mablets twice daily for 7 days. On day 8, switch to one-41mg72mblet twice daily. Replaces: apixaban 2.5 MG Tabs tablet   Azelastine HCl 137 MCG/SPRAY Soln SMARTSIG:2 Puff(s) Both Nares Twice Daily   cetirizine 10 MG tablet Commonly known as: ZYRTEC SMARTSIG:1 pill By Mouth Daily PRN   cyanocobalamin 500 MCG tablet Commonly known as: VITAMIN B12 Take 1 tablet (500 mcg total) by mouth daily.   furosemide 20 MG tablet Commonly known as: LASIX Take 2 tablets (40 mg total) by mouth every morning.   hydrOXYzine 25 MG capsule Commonly  known as: VISTARIL Take 25-50 mg by mouth at bedtime.   metoprolol succinate 25 MG 24 hr tablet Commonly known as: TOPROL-XL Take 25 mg by mouth every morning.   midodrine 5 MG tablet Commonly known as: PROAMATINE Take 1 tablet (5 mg total) by mouth 3 (three) times daily as needed (if SBP <100 mmHg).   mirtazapine 7.5 MG tablet Commonly known as: REMERON Take 7.5 mg by mouth at bedtime.   rOPINIRole 0.25 MG tablet Commonly known as: REQUIP Take 1 tablet (0.25 mg total) by mouth at bedtime.               Durable Medical Equipment  (From admission, onward)           Start     Ordered   09/15/22 1118  For home use only DME Walker rolling  Once       Question Answer Comment  Walker: With [address] needs a walker to treat with the following condition Age-related physical debility      09/15/22 1118           Allergies  Allergen Reactions   Penicillins Anaphylaxis   Discharge Instructions     Call MD for:  difficulty breathing, headache or visual disturbances   Complete by: As directed    Call MD for:  extreme fatigue   Complete by: As directed    Call MD for:  persistant dizziness or light-headedness   Complete by: As directed    Call MD for:  persistant nausea and vomiting   Complete by: As directed    Call MD for:  severe uncontrolled pain   Complete by: As directed    Call MD for:  temperature >100.4   Complete by: As directed    Diet - low sodium heart healthy   Complete by: As directed    Fluid restriction 1.5 L/day   Discharge instructions   Complete by: As directed    Follow-up with PCP in 1 week Follow-up with cardiology in 1 to 2 weeks for diastolic CHF   Increase activity slowly   Complete by: As directed    No wound care   Complete by: As directed        The results of significant diagnostics from this hospitalization (including imaging, microbiology, ancillary and laboratory) are listed below for reference.     Significant Diagnostic Studies: CT Angio Chest Pulmonary Embolism (PE) W or WO Contrast  Result Date: 09/13/2022 CLINICAL DATA:  Bilateral lower lobe pulmonary embolism. EXAM: CT ANGIOGRAPHY CHEST WITH CONTRAST TECHNIQUE: Multidetector CT imaging of the chest was performed using the standard protocol during bolus administration of intravenous contrast. Multiplanar CT image reconstructions and MIPs were obtained to evaluate the vascular anatomy. RADIATION DOSE REDUCTION: This exam was performed according to  the departmental dose-optimization program which includes automated exposure control, adjustment of the mA and/or kV according to patient size and/or use of iterative reconstruction technique. CONTRAST:  1m OMNIPAQUE IOHEXOL 350 MG/ML SOLN COMPARISON:  Prior CTA of the chest on 09/09/2022 FINDINGS: Cardiovascular: The pulmonary arteries are adequately opacified. Central pulmonary arteries remain dilated with the main pulmonary artery measuring 3.7 cm. Under opacified posterior basilar branches of the right pulmonary artery likely contain some residual thrombus in a similar distribution as the prior study but are also difficult to assess due to atelectasis in the right lower lobe. No new or progressive emboli are seen in the right lung. Left lower lobe pulmonary emboli likely improved with less prominent filling defects in the posterior left lower lobe and residual atelectasis remaining making subsegmental branch vessel evaluation difficult. No progressive or new emboli are identified in the left lung. Evaluation of right heart strain is not pertinent by CTA due to lack of submassive pulmonary embolism. Underlying right-sided chamber and left atrial dilatation remains present consistent with cardiomyopathy. Calcified coronary artery plaque also again identified. There is no evidence pericardial fluid. Atherosclerosis of the thoracic aorta also again noted with no evidence of aneurysmal disease.  Mediastinum/Nodes: No enlarged mediastinal, hilar, or axillary lymph nodes. Thyroid gland, trachea, and esophagus demonstrate no significant findings. Lungs/Pleura: Relatively small volume bilateral pleural effusions demonstrate slight increase in volume since the prior study. This results in increase in bibasilar atelectasis, especially on the left. Probable component worsening pulmonary interstitial edema in the lungs bilaterally without overt airspace edema. No pneumothorax. Upper Abdomen: No acute abnormality. Musculoskeletal: No chest wall abnormality. No acute or significant osseous findings. Review of the MIP images confirms the above findings. IMPRESSION: 1. Evidence of probable residual pulmonary emboli in the posterior basilar branches of the right lower lobe pulmonary artery and improved appearance of left lower lobe pulmonary emboli. No new or progressive pulmonary emboli are identified. 2. Evidence of cardiomyopathy with right-sided chamber and left atrial dilatation. Estimation of right heart strain by CTA is not pertinent due to lack of submassive pulmonary embolism. 3. Coronary atherosclerosis. 4. Slight increase in volume of bilateral pleural effusions with associated increase in bibasilar atelectasis, especially on the left. 5. Probable component of worsening pulmonary interstitial edema in the lungs bilaterally without overt airspace edema. 6. Aortic atherosclerosis without evidence of aneurysmal disease. Aortic Atherosclerosis (ICD10-I70.0). Electronically Signed   By: GAletta EdouardM.D.   On: 09/13/2022 12:48   ECHOCARDIOGRAM COMPLETE  Result Date: 09/10/2022    ECHOCARDIOGRAM REPORT   Patient Name:   Marcus DELOEDate of Exam: 09/10/2022 Medical Rec #:  [id number]        Height:       66.0 in Accession #:    [id number]       Weight:       153.0 lb Date of Birth:  1Sep 02, 1926       BSA:          1.785 m Patient Age:    89 years          BP:           108/67 mmHg Patient Gender: M                  HR:           62 bpm. Exam Location:  [facility] Procedure: 2D Echo, Cardiac Doppler and Color Doppler Indications:     CHF  History:  Patient has prior history of Echocardiogram examinations, most                  recent 01/28/2022. CHF, Arrythmias:Atrial Fibrillation,                  Signs/Symptoms:Edema; Risk Factors:Dyslipidemia. Pulmonary                  embolus, Rheumatic fever age 73.  Sonographer:     Wenda Low Referring Phys:  [id number] Val Riles Diagnosing Phys: Ida Rogue MD IMPRESSIONS  1. Left ventricular ejection fraction, by estimation, is 55 to 60%. The left ventricle has normal function. The left ventricle has no regional wall motion abnormalities. There is moderate left ventricular hypertrophy. Left ventricular diastolic parameters are indeterminate. There is the interventricular septum is flattened in systole and diastole, consistent with right ventricular pressure and volume overload.  2. Right ventricular systolic function is mildly reduced. The right ventricular size is moderately enlarged. There is severely elevated pulmonary artery systolic pressure. The estimated right ventricular systolic pressure is [id number] mmHg.  3. Left atrial size was moderately dilated.  4. Right atrial size was mildly dilated.  5. The mitral valve is normal in structure. Moderate to severe mitral valve regurgitation. No evidence of mitral stenosis.  6. Tricuspid valve regurgitation is moderate to severe.  7. The aortic valve has an indeterminant number of cusps. Aortic valve regurgitation is not visualized. Aortic valve sclerosis/calcification is present, without any evidence of aortic stenosis.  8. There is borderline dilatation of the ascending aorta, measuring 36 mm.  9. The inferior vena cava is dilated in size with <50% respiratory variability, suggesting right atrial pressure of 15 mmHg. FINDINGS  Left Ventricle: Left ventricular ejection fraction, by estimation, is 55 to 60%. The left  ventricle has normal function. The left ventricle has no regional wall motion abnormalities. The left ventricular internal cavity size was normal in size. There is  moderate left ventricular hypertrophy. The interventricular septum is flattened in systole and diastole, consistent with right ventricular pressure and volume overload. Left ventricular diastolic parameters are indeterminate. Right Ventricle: The right ventricular size is moderately enlarged. No increase in right ventricular wall thickness. Right ventricular systolic function is mildly reduced. There is severely elevated pulmonary artery systolic pressure. The tricuspid regurgitant velocity is 3.72 m/s, and with an assumed right atrial pressure of 15 mmHg, the estimated right ventricular systolic pressure is [id number] mmHg. Left Atrium: Left atrial size was moderately dilated. Right Atrium: Right atrial size was mildly dilated. Pericardium: There is no evidence of pericardial effusion. Mitral Valve: The mitral valve is normal in structure. There is moderate thickening of the mitral valve leaflet(s). There is moderate calcification of the mitral valve leaflet(s). Moderate to severe mitral valve regurgitation, with posteriorly-directed jet. No evidence of mitral valve stenosis. MV peak gradient, 40.4 mmHg. The mean mitral valve gradient is 25.5 mmHg. Tricuspid Valve: The tricuspid valve is normal in structure. Tricuspid valve regurgitation is moderate to severe. No evidence of tricuspid stenosis. Aortic Valve: The aortic valve has an indeterminant number of cusps. Aortic valve regurgitation is not visualized. Aortic valve sclerosis/calcification is present, without any evidence of aortic stenosis. Aortic valve mean gradient measures 4.0 mmHg. Aortic valve peak gradient measures 7.3 mmHg. Aortic valve area, by VTI measures 2.23 cm. Pulmonic Valve: The pulmonic valve was normal in structure. Pulmonic valve regurgitation is mild. No evidence of pulmonic stenosis.  Aorta: The aortic root is normal in size and structure. There is  borderline dilatation of the ascending aorta, measuring 36 mm. Venous: The inferior vena cava is dilated in size with less than 50% respiratory variability, suggesting right atrial pressure of 15 mmHg. IAS/Shunts: No atrial level shunt detected by color flow Doppler.  LEFT VENTRICLE PLAX 2D LVIDd:         3.70 cm LVIDs:         2.50 cm LV PW:         1.10 cm LV IVS:        1.30 cm LVOT diam:     2.00 cm LV SV:         60 LV SV Index:   34 LVOT Area:     3.14 cm  RIGHT VENTRICLE RV Basal diam:  4.80 cm RV Mid diam:    4.00 cm RV S prime:     9.03 cm/s LEFT ATRIUM             Index        RIGHT ATRIUM           Index LA diam:        4.40 cm 2.47 cm/m   RA Area:     22.90 cm LA Vol (A2C):   97.4 ml 54.58 ml/m  RA Volume:   68.80 ml  38.55 ml/m LA Vol (A4C):   87.2 ml 48.86 ml/m LA Biplane Vol: 95.4 ml 53.46 ml/m  AORTIC VALVE                    PULMONIC VALVE AV Area (Vmax):    2.30 cm     PV Vmax:       0.91 m/s AV Area (Vmean):   2.18 cm     PV Peak grad:  3.3 mmHg AV Area (VTI):     2.23 cm AV Vmax:           135.00 cm/s AV Vmean:          89.600 cm/s AV VTI:            0.270 m AV Peak Grad:      7.3 mmHg AV Mean Grad:      4.0 mmHg LVOT Vmax:         99.00 cm/s LVOT Vmean:        62.100 cm/s LVOT VTI:          0.192 m LVOT/AV VTI ratio: 0.71  AORTA Ao Root diam: 3.40 cm Ao Asc diam:  3.60 cm MITRAL VALVE                  TRICUSPID VALVE MV Area (PHT): 4.29 cm       TR Peak grad:   55.4 mmHg MV Area VTI:   0.66 cm       TR Vmax:        372.00 cm/s MV Peak grad:  40.4 mmHg MV Mean grad:  25.5 mmHg      SHUNTS MV Vmax:       3.18 m/s       Systemic VTI:  0.19 m MV Vmean:      195.7 cm/s     Systemic Diam: 2.00 cm MV Decel Time: 177 msec MR Peak grad:    72.6 mmHg MR Mean grad:    49.0 mmHg MR Vmax:         426.00 cm/s MR Vmean:        327.0 cm/s MR PISA:  4.02 cm MR PISA Eff ROA: 80 mm MR PISA Radius:  0.80 cm MV E velocity: 155.00  cm/s Ida Rogue MD Electronically signed by Ida Rogue MD Signature Date/Time: 09/10/2022/5:07:49 PM    Final    US Venous Img Lower Bilateral (DVT)  Result Date: 09/10/2022 CLINICAL DATA:  Pulmonary embolism. EXAM: BILATERAL LOWER EXTREMITY VENOUS DOPPLER ULTRASOUND TECHNIQUE: Gray-scale sonography with graded compression, as well as color Doppler and duplex ultrasound were performed to evaluate the lower extremity deep venous systems from the level of the common femoral vein and including the common femoral, femoral, profunda femoral, popliteal and calf veins including the posterior tibial, peroneal and gastrocnemius veins when visible. The superficial great saphenous vein was also interrogated. Spectral Doppler was utilized to evaluate flow at rest and with distal augmentation maneuvers in the common femoral, femoral and popliteal veins. COMPARISON:  None Available. FINDINGS: RIGHT LOWER EXTREMITY Common Femoral Vein: No evidence of thrombus. Normal compressibility, respiratory phasicity and response to augmentation. Saphenofemoral Junction: No evidence of thrombus. Normal compressibility and flow on color Doppler imaging. Profunda Femoral Vein: No evidence of thrombus. Normal compressibility and flow on color Doppler imaging. Femoral Vein: No evidence of thrombus. Normal compressibility, respiratory phasicity and response to augmentation. Popliteal Vein: No evidence of thrombus. Normal compressibility, respiratory phasicity and response to augmentation. Calf Veins: No evidence of thrombus. Normal compressibility and flow on color Doppler imaging. Superficial Great Saphenous Vein: No evidence of thrombus. Normal compressibility. Venous Reflux:  None. Other Findings:  None. LEFT LOWER EXTREMITY Common Femoral Vein: No evidence of thrombus. Normal compressibility, respiratory phasicity and response to augmentation. Saphenofemoral Junction: No evidence of thrombus. Normal compressibility and flow on color  Doppler imaging. Profunda Femoral Vein: No evidence of thrombus. Normal compressibility and flow on color Doppler imaging. Femoral Vein: No evidence of thrombus. Normal compressibility, respiratory phasicity and response to augmentation. Popliteal Vein: No evidence of thrombus. Normal compressibility, respiratory phasicity and response to augmentation. Calf Veins: No evidence of thrombus. Normal compressibility and flow on color Doppler imaging. Superficial Great Saphenous Vein: No evidence of thrombus. Normal compressibility. Venous Reflux:  None. Other Findings:  None. IMPRESSION: No evidence of deep venous thrombosis in either lower extremity. Electronically Signed   By: Valetta Mole M.D.   On: 09/10/2022 11:44   CT Angio Chest Pulmonary Embolism (PE) W or WO Contrast  Result Date: 09/09/2022 CLINICAL DATA:  Pulmonary embolus suspected with high probability. Bilateral leg swelling and fluid buildup. Some shortness of breath. EXAM: CT ANGIOGRAPHY CHEST WITH CONTRAST TECHNIQUE: Multidetector CT imaging of the chest was performed using the standard protocol during bolus administration of intravenous contrast. Multiplanar CT image reconstructions and MIPs were obtained to evaluate the vascular anatomy. RADIATION DOSE REDUCTION: This exam was performed according to the departmental dose-optimization program which includes automated exposure control, adjustment of the mA and/or kV according to patient size and/or use of iterative reconstruction technique. CONTRAST:  70m OMNIPAQUE IOHEXOL 350 MG/ML SOLN COMPARISON:  06/26/2021 FINDINGS: Cardiovascular: There is good opacification of the central and segmental pulmonary arteries. Examination is technically adequate. Filling defects are demonstrated in multiple bilateral lower lobe segmental and subsegmental pulmonary arteries consistent with acute pulmonary embolus. The RV to LV ratio is elevated at 1.8. This may indicate evidence of right heart strain although the  clot burden is relatively low and changes could also be related to underlying heart failure. Clinical correlation suggested. Diffuse cardiac enlargement. Normal caliber thoracic aorta. Calcification of the aorta and coronary arteries. Mediastinum/Nodes: Thyroid gland  is unremarkable. Esophagus is decompressed. No significant lymphadenopathy. Lungs/Pleura: Small bilateral pleural effusions with basilar atelectasis. Peripheral interstitial changes in the lungs likely represents early pulmonary edema. No pneumothorax. Upper Abdomen: Probable hepatic cirrhosis with nodular liver contour and enlarged lateral segment left and caudate lobes of the liver. Gallbladder wall is thickened, likely related to liver disease. Moderate ascites in the upper abdomen. 6 mm stone in the right kidney. No hydronephrosis or hydroureter. Musculoskeletal: Degenerative changes in the spine. Old bilateral rib fractures. Review of the MIP images confirms the above findings. IMPRESSION: 1. Positive examination for acute pulmonary embolus involving bilateral lower lobe pulmonary arteries. Elevated RV to LV ratio of 1.8 may indicate right heart strain. 2. Cardiac enlargement with bilateral pleural effusions and probable interstitial edema. 3. Probable hepatic cirrhosis with upper abdominal ascites. 4. 6 mm nonobstructing stones seen in the right kidney. 5. Aortic atherosclerosis. Critical Value/emergent results were called by telephone at the time of interpretation on 09/09/2022 at 9:02 pm to provider [facility] , who verbally acknowledged these results. Electronically Signed   By: Lucienne Capers M.D.   On: 09/09/2022 21:07   US RENAL  Result Date: 09/09/2022 CLINICAL DATA:  Acute kidney injury EXAM: RENAL / URINARY TRACT ULTRASOUND COMPLETE COMPARISON:  CT 12/24/2021 FINDINGS: Right Kidney: Renal measurements: 9.9 x 5.3 x 5.2 = volume: 142 mL. Echogenicity within normal limits. No mass or hydronephrosis visualized. Central presumed  parapelvic renal cyst as seen on prior CT scan measuring 19 mm. No collecting system dilatation or perinephric fluid. Left Kidney: Renal measurements: 9.9 x 4.4 x 4.5 = volume: 103 mL. There are some near anechoic foci in the left kidney with through transmission measuring 2.2 x 1.9 x 1.8 cm and 2.1 by 2.2 x 1.8 cm. On prior CT scan these are also felt to be cysts. There is however a lesion in the posterior aspect of the left kidney on the prior measuring 19 mm which was a solid lesion. This is not clearly seen on today's examination. Bladder: Appears normal for degree of bladder distention. Other: Scattered ascites IMPRESSION: No collecting system dilatation.  Bilateral renal cysts. Ascites. Prior CT scan described enhancing mass in the left kidney. This is not clearly seen on this examination today. On the prior study this is felt to be an aggressive lesion or potentially neoplasm. Please correlate with known history and workup Electronically Signed   By: Jill Side M.D.   On: 09/09/2022 19:32   DG Chest 2 View  Result Date: 09/09/2022 CLINICAL DATA:  Chest pain. EXAM: CHEST - 2 VIEW COMPARISON:  January 27, 2022. FINDINGS: Stable cardiomegaly. Mild bibasilar subsegmental atelectasis or possibly edema is noted. Bony thorax is unremarkable. IMPRESSION: Mild bibasilar subsegmental atelectasis or possibly edema is noted. Aortic Atherosclerosis (ICD10-I70.0). Electronically Signed   By: Marijo Conception M.D.   On: 09/09/2022 14:18    Microbiology: No results found for this or any previous visit (from the past 240 hour(s)).   Labs: CBC: Recent Labs  Lab 09/11/22 0402 09/12/22 0729 09/13/22 0629 09/14/22 0330 09/15/22 0312  WBC 5.8 5.2 5.3 5.9 4.7  HGB 12.3* 12.0* 12.9* 13.1 12.3*  HCT 37.2* 36.2* 39.4 40.1 37.6*  MCV 103.6* 103.1* 103.4* 102.3* 102.5*  PLT 112* 105* 105* 103* 96*   Basic Metabolic Panel: Recent Labs  Lab 09/10/22 0441 09/11/22 0402 09/12/22 0729 09/13/22 0629 09/14/22 0330  09/15/22 0312  NA 133* 137 136 138 136 135  K 4.3 3.6 3.2* 4.2 3.7 3.7  CL 98 98 98 100 98 98  CO2 26 32 31 31 29 28  $ GLUCOSE 91 84 89 86 86 85  BUN 48* 37* 29* 30* 27* 22  CREATININE 1.56* 1.28* 1.07 0.92 0.97 0.80  CALCIUM 8.3* 8.8* 8.2* 8.5* 8.5* 8.8*  MG 2.2 1.9 1.7  --   --   --   PHOS 4.6 3.1 2.7  --   --   --    Liver Function Tests: No results for input(s): "AST", "ALT", "ALKPHOS", "BILITOT", "PROT", "ALBUMIN" in the last 168 hours. No results for input(s): "LIPASE", "AMYLASE" in the last 168 hours. No results for input(s): "AMMONIA" in the last 168 hours. Cardiac Enzymes: No results for input(s): "CKTOTAL", "CKMB", "CKMBINDEX", "TROPONINI" in the last 168 hours. BNP (last 3 results) Recent Labs    01/27/22 1520 02/03/22 1213 09/09/22 1356  BNP 1,028.9* 494.5* 1,637.8*   CBG: No results for input(s): "GLUCAP" in the last 168 hours.  Time spent: 35 minutes  Signed:  Val Riles  [facility]  09/15/2022 11:31 AM

## 2022-09-15 NOTE — TOC Transition Note (Signed)
Transition of Care [facility]) - CM/SW Discharge Note   Patient Details  Name: [name] MRN: [id number] Date of Birth: 1925-07-25  [facility]) CM/SW Contact:  Candie Chroman, LCSW Phone Number: 09/15/2022, 12:24 PM   Clinical Narrative:   Patient has orders to discharge home today. Alvis Lemmings accepted home health referral for PT, OT, RN, aide. No further concerns. CSW signing off.  Final next level of care: Home w Home Health Services Barriers to Discharge: Barriers Resolved   Patient Goals and CMS Choice CMS Medicare.gov Compare Post Acute Care list provided to:: Patient (Wife and daughter)    Discharge Placement                  Patient to be transferred to facility by: Family Name of family member notified: Keats Camerino Patient and family notified of of transfer: 09/15/22  Discharge Plan and Services Additional resources added to the After Visit Summary for       Post Acute Care Choice: Home Health, Durable Medical Equipment          DME Arranged: Gilford Rile rolling DME Agency: [employer] Date DME Agency Contacted: 09/15/22   Representative spoke with at DME Agency: [place] Arranged: RN, PT, OT, Nurse's Aide [facility] Agency: [place] Date [place]: 09/15/22   Representative spoke with at [place]: Adela Lank  Social Determinants of Health ([place]) Interventions SDOH Screenings   Food Insecurity: No Food Insecurity (09/10/2022)  Housing: Low Risk  (09/10/2022)  Transportation Needs: No Transportation Needs (09/10/2022)  Utilities: Not At Risk (09/10/2022)  Tobacco Use: Medium Risk (09/13/2022)     Readmission Risk Interventions     No data to display

## 2022-09-15 NOTE — Progress Notes (Signed)
  Progress Note    09/15/2022 10:18 AM * No surgery found *  Subjective:  Marcus Kemp is a 89yoM who comes to [facility] with dyspnea, found to be in volume overload and with acute PE. Pt anticoagulated on heparin, then transitioned to PO meds for home. Patient lives at home with his wife.   On exam today he shows no SOB at rest. He is oxygenating on room air. He states he walked out in the hallway yesterday without any difficulties. No significant events overnight. No respiratory distress, denies any chest pain or palpitations. 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     Vitals:   09/15/22 0328 09/15/22 0831  BP: 111/67 113/74  Pulse: 78 80  Resp: 20 18  Temp: 98 F (36.7 C) 97.6 F (36.4 C)  SpO2: 95% 96%   Physical Exam: Cardiac:  RRR S1 and S2 without murmurs Lungs:  Cear throughout on auscultation with slight rales in the bases bilaterally.  Incisions:  N/A Extremities:  Bilateral lower extremity edema +1. Unable to palpate pulses   Abdomen:   Positive bowel sounds, soft, flat, non tender, non distended.   Neurologic: AAOX3, Neuro intact, follows commands, answers all questions appropriately.    CBC    Component Value Date/Time   WBC 4.7 09/15/2022 0312   RBC 3.67 (L) 09/15/2022 0312   HGB 12.3 (L) 09/15/2022 0312   HCT 37.6 (L) 09/15/2022 0312   PLT 96 (L) 09/15/2022 0312   MCV 102.5 (H) 09/15/2022 0312   MCH 33.5 09/15/2022 0312   MCHC 32.7 09/15/2022 0312   RDW 17.1 (H) 09/15/2022 0312   LYMPHSABS 1.2 01/27/2022 1520   MONOABS 0.6 01/27/2022 1520   EOSABS 0.1 01/27/2022 1520   BASOSABS 0.1 01/27/2022 1520    BMET    Component Value Date/Time   NA 135 09/15/2022 0312   K 3.7 09/15/2022 0312   CL 98 09/15/2022 0312   CO2 28 09/15/2022 0312   GLUCOSE 85 09/15/2022 0312   BUN 22 09/15/2022 0312   CREATININE 0.80 09/15/2022 0312   CALCIUM 8.8 (L) 09/15/2022 0312   GFRNONAA >60 09/15/2022 0312   GFRAA >60 05/10/2016 1800    INR No results found  for: "INR"   Intake/Output Summary (Last 24 hours) at 09/15/2022 1018 Last data filed at 09/15/2022 0700 Gross per 24 hour  Intake 665 ml  Output 950 ml  Net -285 ml     Assessment/Plan:  89 y.o. male who was admitted to the hospital for an exacerbation of CHF with pulmonary embolism  * No surgery found *   Vascular Surgery does not see a need for any intervention at this time. Convert patient off heparin infusion and back to his Eliquis 2.5 mg BID for anticoagulation needs. Wife states last time he was placed on Eliquis 5 mg BID he had multiple episodes of bleeding mostly GI.   Vascular Surgery is okay with patient discharge by primary team when deemed appropriate.  Vascular Surgery signs off the case at this time. If needed again please consult [place] again and we will gladly see the patient.  DVT prophylaxis:  Eliquis   Drema Pry [facility] 09/15/2022 10:18 AM

## 2022-09-15 NOTE — TOC Initial Note (Addendum)
Transition of Care [facility]) - Initial/Assessment Note    Patient Details  Name: Marcus Kemp MRN: [id number] Date of Birth: 1925-07-10  Transition of Care [facility]) CM/SW Contact:    Candie Chroman, LCSW Phone Number: 09/15/2022, 11:16 AM  Clinical Narrative:   CSW met with patient. Wife and daughter at bedside. CSW introduced role and explained that therapy recommendations would be discussed. Patient and family are agreeable to home health. No agency preference Will start search. DME recommendation for RW. They are agreeable to this as well. Ordered through [employer]. No further concerns. CSW encouraged patient and family to contact CSW as needed. CSW will continue to follow patient and his family for support and facilitate return home today.               Expected Discharge Plan: [place] Services Barriers to Discharge: Other (must enter comment) (Home health setup, DME delivery.)   Patient Goals and CMS Choice   CMS Medicare.gov Compare Post Acute Care list provided to:: Patient (Wife and daughter)        Expected Discharge Plan and Services     Post Acute Care Choice: Home Health, Durable Medical Equipment Living arrangements for the past 2 months: Single Family Home Expected Discharge Date: 09/15/22                                    Prior Living Arrangements/Services Living arrangements for the past 2 months: Single Family Home Lives with:: Spouse Patient language and need for interpreter reviewed:: Yes Do you feel safe going back to the place where you live?: Yes      Need for Family Participation in Patient Care: Yes (Comment) Care giver support system in place?: Yes (comment) Current home services: DME Criminal Activity/Legal Involvement Pertinent to Current Situation/Hospitalization: No - Comment as needed  Activities of Daily Living Home Assistive Devices/Equipment: Hearing aid, Cane (specify quad or straight) ADL Screening (condition at time of  admission) Patient's cognitive ability adequate to safely complete daily activities?: Yes Is the patient deaf or have difficulty hearing?: No Does the patient have difficulty seeing, even when wearing glasses/contacts?: No Does the patient have difficulty concentrating, remembering, or making decisions?: No Patient able to express need for assistance with ADLs?: Yes Does the patient have difficulty dressing or bathing?: No Independently performs ADLs?: Yes (appropriate for developmental age) Does the patient have difficulty walking or climbing stairs?: No Weakness of Legs: None Weakness of Arms/Hands: None  Permission Sought/Granted Permission sought to share information with : Facility Sport and exercise psychologist, Family Supports    Share Information with NAME: Ripken Monterrosa  Permission granted to share info w AGENCY: [place] granted to share info w Relationship: Wife  Permission granted to share info w Contact Information: [phone]  Emotional Assessment Appearance:: Appears stated age Attitude/Demeanor/Rapport: Engaged Affect (typically observed): Accepting, Appropriate, Calm Orientation: : Oriented to Self, Oriented to Place, Oriented to  Time, Oriented to Situation Alcohol / Substance Use: Not Applicable Psych Involvement: No (comment)  Admission diagnosis:  Shortness of breath [R06.02] CHF (congestive heart failure) (HCC) [I50.9] Peripheral edema [R60.9] Acute respiratory failure with hypoxia (HCC) [J96.01] Patient Active Problem List   Diagnosis Date Noted   Acute pulmonary embolism with acute cor pulmonale ([place]) 09/10/2022   CHF (congestive heart failure) ([place]) 09/09/2022   Acute respiratory failure with hypoxia (HCC) 09/09/2022   Anemia 01/27/2022   Hyperlipidemia  01/27/2022   MVP (mitral valve prolapse) 01/27/2022   Nephrolithiasis 01/27/2022   Rheumatic fever 01/27/2022   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   Anasarca 01/27/2022    Macrocytosis 01/27/2022   Rheumatic tricuspid valve regurgitation 09/19/2017   Status post catheter ablation of atrial flutter 07/13/2017   Jugular venous distension 07/12/2017   Peripheral edema 01/31/2017   Acute on chronic diastolic CHF (congestive heart failure) ([place]) 09/24/2016   Longstanding persistent atrial fibrillation ([place]) 05/11/2016   Epidural abscess 04/25/2016   Muscle abscess 04/25/2016   Osteomyelitis of cervical spine ([place]) 04/25/2016   Staphylococcus aureus bacteremia with sepsis ([place]) 04/25/2016   Sepsis ([place]) 04/22/2016   Community acquired pneumonia 04/22/2016   PCP:  Idelle Crouch, MD Pharmacy:   [id number] [place] [place]Phone: [phone]: [address]Phone: [phone]: [phone]    Social Determinants of Health (SDOH) Social History: SDOH Screenings   Food Insecurity: No Food Insecurity (09/10/2022)  Housing: Low Risk  (09/10/2022)  Transportation Needs: No Transportation Needs (09/10/2022)  Utilities: Not At Risk (09/10/2022)  Tobacco Use: Medium Risk (09/13/2022)   SDOH Interventions:     Readmission Risk Interventions     No data to display

## 2022-09-16 LAB — VITAMIN D 25 HYDROXY (VIT D DEFICIENCY, FRACTURES)

## 2022-09-18 NOTE — Progress Notes (Unsigned)
Patient ID: Marcus Kemp, male    DOB: September 11, 1924, 89 y.o.   MRN: [id number]  HPI  Marcus Kemp is a 89 y/o male with a history of AAA, hyperlipidemia, HTN, CKD, atrial fibrillation, anemia, GERD,   Echo 09/10/22 showed an EF of 55-60% along with moderate LVH along with severely elevated PA pressure of 70.4 mmHg, moderate LAE and moderate/ severe Marcus/TR.   Admitted 09/09/22 due to pedal edema due to HF exacerbation. Diuresed. D-dimer elevated and CTA + for PE with right heart strain RV:LV 1.8, cardiac enlargement with bilateral pleural effusions and probable interstitial edema. Heparin gtt and then placed on eliquis. Midodrine started.   He presents today for his initial visit with a chief complaint of minimal fatigue with moderate exertion. Describes this as chronic in nature although feels like it's improving. Has associated sensorineural deafness associated along with this. Denies any dizziness, difficulty sleeping, abdominal distention, palpitations, pedal edema, chest pain, SOB, cough or weight gain.   His wife says that his home BP's run 102-107/ 60's. Weighing daily and says that his weight stays around 123 pounds.   Past Medical History:  Diagnosis Date   A-fib [facility])    a.) CHA2DS2-VASc = 4 (age x 2, CHF, aortic plaque). b.) s/p unsuccessful TEE cardioversion 04/28/2016 --> rec'd 200 J x1 and 360 J x 2. c.) rate/rhythm maintained on oral metoprolol succinante; chronically anticoagulated using dose reduced apixaban.   Anemia    Aortic atherosclerosis (HCC)    Arthritis    CHF (congestive heart failure) ([place])    a.) TTE 04/26/2016: EF >55%; mod LVH; MVP; mild-mod LAE, mild Marcus. b.) TTE 05/14/209: EF >55%; mod RVE; mild BAE; mod Marcus.TR; triv AR/PR. c.) TTE 03/28/2019: EF >55%; mild LVH; mod BAE; mild PR, mod Marcus/TR.   GERD (gastroesophageal reflux disease)    Heart murmur    History of kidney stones    HOH (hard of hearing)    a.) hearing aid to LEFT ear.   Hyperlipidemia     Hypertension    Infrarenal abdominal aortic aneurysm (AAA) without rupture ([place]) 05/05/2021   a.) CT 05/05/2021 --> measured 3.0 cm.   MVP (mitral valve prolapse)    Peripheral edema    Pneumonia    Pulmonary embolus ([place])    Pulmonary HTN ([place]) 04/26/2016   a.) TTE 04/26/2016: EF >55%; PASP 45. b.) TTE 12/06/2017: EF >55%; RVSP 76.6 mmHg. c.) TTE 03/28/2019: EF >55%; RVSP 77.8 mmHg.   Renal cell carcinoma (HCC)    Rheumatic fever    Sepsis ([place])    Valvular heart disease    Past Surgical History:  Procedure Laterality Date   INGUINAL HERNIA REPAIR Left 01/13/2022   Procedure: HERNIA REPAIR INGUINAL ADULT;  Surgeon: Robert Bellow, MD;  Location: [facility] ORS;  Service: General;  Laterality: Left;   INSERTION OF MESH  01/13/2022   Procedure: INSERTION OF MESH;  Surgeon: Robert Bellow, MD;  Location: [facility] ORS;  Service: General;;   LITHOTRIPSY     TEE WITH CARDIOVERSION N/A    P   TONSILLECTOMY     Family History  Problem Relation Age of Onset   Heart disease Sister    Hypertension Other    Social History   Tobacco Use   Smoking status: Former    Packs/day: 0.25    Years: 8.00    Total pack years: 2.00    Types: Cigarettes    Quit date: 1940    Years since quitting: 42.2  Smokeless tobacco: Never  Substance Use Topics   Alcohol use: No   Allergies  Allergen Reactions   Penicillins Anaphylaxis   Prior to Admission medications   Medication Sig Start Date End Date Taking? Authorizing Provider  acetaminophen (TYLENOL) 500 MG tablet Take 1,000 mg by mouth at bedtime.   Yes [provider]  APIXABAN Arne [place]) VTE STARTER PACK ('10MG'$  AND '5MG'$ ) Take as directed on package: start with two-'5mg'$  tablets twice daily for 7 days. On day 8, switch to one-'5mg'$  tablet twice daily. 09/14/22  Yes Val Riles, MD  Azelastine HCl 137 MCG/SPRAY SOLN SMARTSIG:2 Puff(s) Both Nares Twice Daily 12/08/21  Yes [provider]  cetirizine (ZYRTEC) 10 MG tablet SMARTSIG:1 pill  By Mouth Daily PRN 12/13/21  Yes [provider]  furosemide (LASIX) 20 MG tablet Take 2 tablets (40 mg total) by mouth every morning. 09/15/22 12/14/22 Yes Val Riles, MD  hydrOXYzine (VISTARIL) 25 MG capsule Take 25-50 mg by mouth at bedtime. 07/20/22  Yes [provider]  metoprolol succinate (TOPROL-XL) 25 MG 24 hr tablet Take 25 mg by mouth every morning. 05/03/21  Yes [provider]  midodrine (PROAMATINE) 5 MG tablet Take 1 tablet (5 mg total) by mouth 3 (three) times daily as needed (if SBP <100 mmHg). 09/15/22 10/15/22 Yes Val Riles, MD  rOPINIRole (REQUIP) 0.25 MG tablet Take 1 tablet (0.25 mg total) by mouth at bedtime. 09/15/22 12/14/22 Yes Val Riles, MD  vitamin B-12 (CYANOCOBALAMIN) 500 MCG tablet Take 1 tablet (500 mcg total) by mouth daily. 12/29/21  Yes Earlie Server, MD     Review of Systems  Constitutional:  Positive for fatigue. Negative for appetite change.  HENT:  Positive for hearing loss. Negative for congestion and sore throat.   Eyes: Negative.   Respiratory:  Negative for cough, chest tightness and shortness of breath.   Cardiovascular:  Negative for chest pain, palpitations and leg swelling.  Gastrointestinal:  Negative for abdominal distention and abdominal pain.  Endocrine: Negative.   Genitourinary: Negative.   Musculoskeletal:  Negative for back pain and neck pain.  Skin: Negative.   Allergic/Immunologic: Negative.   Neurological:  Negative for dizziness and light-headedness.  Hematological:  Negative for adenopathy. Does not bruise/bleed easily.  Psychiatric/Behavioral:  Negative for dysphoric mood and sleep disturbance (sleeping well except for urination). The patient is not nervous/anxious.    Vitals:   09/20/22 1115  BP: 98/61  Pulse: 72  Resp: 14  SpO2: 100%  Weight: 128 lb 4 oz (58.2 kg)   Wt Readings from Last 3 Encounters:  09/20/22 128 lb 4 oz (58.2 kg)  09/15/22 128 lb 1.4 oz (58.1 kg)  01/27/22 153 lb (69.4 kg)    Lab Results  Component Value Date   CREATININE 0.80 09/15/2022   CREATININE 0.97 09/14/2022   CREATININE 0.92 09/13/2022   Physical Exam Vitals and nursing note reviewed. Exam conducted with a chaperone present (wife).  Constitutional:      Appearance: Normal appearance.  HENT:     Head: Normocephalic and atraumatic.  Cardiovascular:     Rate and Rhythm: Normal rate. Rhythm irregular.  Pulmonary:     Effort: Pulmonary effort is normal. No respiratory distress.     Breath sounds: No wheezing or rales.  Abdominal:     General: There is no distension.     Palpations: Abdomen is soft.     Tenderness: There is no abdominal tenderness.  Musculoskeletal:        General: No tenderness.  Cervical back: Normal range of motion and neck supple.     Right lower leg: No edema.     Left lower leg: No edema.  Skin:    General: Skin is warm and dry.  Neurological:     General: No focal deficit present.     Mental Status: He is alert and oriented to person, place, and time.  Psychiatric:        Mood and Affect: Mood normal.        Behavior: Behavior normal.        Thought Content: Thought content normal.   Assessment & Plan:  1: Chronic heart failure with preserved ejection fraction with LVH- - NYHA class II - euvolemic - weighing daily; reminded to call for an overnight weight gain of > 2 pounds or a weekly weight gain of > 5 pounds - not adding salt to his food - metoprolol '25mg'$  daily - furosemide '40mg'$  daily; decrease this to '20mg'$  AM and additional '20mg'$  PRN - BNP 09/09/22 was 1637.8  2: HTN- - BP 98/61 - decrease furosemide per above - midodrine '5mg'$  TID PRN - saw PCP ([place]) 07/07/22; returns in 2 weeks - BMP 09/15/22 showed sodium 135, potassium 3.7, creatinine 0.8 and GFR >60  3: Atrial fibrillation-  - saw cardiology Margarito Courser) 07/14/22 - eliquis '5mg'$  BID   Medication list reviewed.   Return in 6 weeks, sooner if needed.

## 2022-09-20 ENCOUNTER — Encounter: Payer: Self-pay | Admitting: Family

## 2022-09-20 ENCOUNTER — Ambulatory Visit: Payer: Medicare Other | Attending: Family | Admitting: Family

## 2022-09-20 VITALS — BP 98/61 | HR 72 | Resp 14 | Wt 128.2 lb

## 2022-09-20 DIAGNOSIS — I10 Essential (primary) hypertension: Secondary | ICD-10-CM

## 2022-09-20 DIAGNOSIS — Z79899 Other long term (current) drug therapy: Secondary | ICD-10-CM | POA: Diagnosis not present

## 2022-09-20 DIAGNOSIS — Z87891 Personal history of nicotine dependence: Secondary | ICD-10-CM | POA: Insufficient documentation

## 2022-09-20 DIAGNOSIS — Z7901 Long term (current) use of anticoagulants: Secondary | ICD-10-CM | POA: Insufficient documentation

## 2022-09-20 DIAGNOSIS — E785 Hyperlipidemia, unspecified: Secondary | ICD-10-CM | POA: Insufficient documentation

## 2022-09-20 DIAGNOSIS — I4891 Unspecified atrial fibrillation: Secondary | ICD-10-CM | POA: Diagnosis not present

## 2022-09-20 DIAGNOSIS — I130 Hypertensive heart and chronic kidney disease with heart failure and stage 1 through stage 4 chronic kidney disease, or unspecified chronic kidney disease: Secondary | ICD-10-CM | POA: Insufficient documentation

## 2022-09-20 DIAGNOSIS — I4811 Longstanding persistent atrial fibrillation: Secondary | ICD-10-CM

## 2022-09-20 DIAGNOSIS — H905 Unspecified sensorineural hearing loss: Secondary | ICD-10-CM | POA: Insufficient documentation

## 2022-09-20 DIAGNOSIS — N189 Chronic kidney disease, unspecified: Secondary | ICD-10-CM | POA: Diagnosis not present

## 2022-09-20 DIAGNOSIS — K219 Gastro-esophageal reflux disease without esophagitis: Secondary | ICD-10-CM | POA: Insufficient documentation

## 2022-09-20 DIAGNOSIS — Z86711 Personal history of pulmonary embolism: Secondary | ICD-10-CM | POA: Insufficient documentation

## 2022-09-20 DIAGNOSIS — I5032 Chronic diastolic (congestive) heart failure: Secondary | ICD-10-CM | POA: Diagnosis present

## 2022-09-20 DIAGNOSIS — I081 Rheumatic disorders of both mitral and tricuspid valves: Secondary | ICD-10-CM | POA: Diagnosis not present

## 2022-09-20 NOTE — Patient Instructions (Signed)
Decrease your furosemide to '20mg'$  daily with an additional '20mg'$  if you need it for weight gain or swelling. If you have to resume '40mg'$  every day, that is ok to do.

## 2022-10-06 ENCOUNTER — Telehealth: Payer: Self-pay | Admitting: Family

## 2022-10-06 MED ORDER — POTASSIUM CHLORIDE CRYS ER 20 MEQ PO TBCR: 20.0000 meq | EXTENDED_RELEASE_TABLET | Freq: Every day | ORAL | 0 refills | Status: DC

## 2022-10-06 NOTE — Telephone Encounter (Signed)
Reviewed Helene Kelp, NP, advice and orders with patient's wife. Pt's wife aware, agreeable, and verbalized understanding.  She states she will pick up potassium prescription and take 20 meq potassium with 60 mg lasix until swelling and weight gain have returned to normal then will reduce lasix to 40 mg and stop potassium. Verbalized agreement that she will call clinic for a sooner appointment if symptoms do not improve, or symptoms return once they resume 40 mg lasix.

## 2022-10-06 NOTE — Telephone Encounter (Signed)
Pt's wife called stating that the last time pt was in clinic. He weighed 128 and this morning, he weighed in at 134 (2.26.24). Pt's wife would like a call back in regards to what she needs to do. Please call  pt/pt's wife at [phone]

## 2022-10-06 NOTE — Telephone Encounter (Signed)
Spoke with patient's wife. She states pt's weight is up 3 lbs overnight, from 131 lb to 134 lb. Patient reports feeling like he has swelling in the groin area.  Pt's wife states they tried decreasing lasix to 20 mg after last appt with Darylene Price, NP, but he immediately had weight gain and swelling so they have had to continue giving 40 mg lasix daily for the past two weeks, and he still continues to have weight gain. She reports she check pt's BP daily. Pt's systolic blood pressure is still running in the upper 90's.

## 2022-10-19 ENCOUNTER — Ambulatory Visit: Payer: Medicare Other | Attending: Family | Admitting: Family

## 2022-10-19 ENCOUNTER — Encounter: Payer: Self-pay | Admitting: Family

## 2022-10-19 VITALS — BP 103/68 | HR 59 | Wt 144.0 lb

## 2022-10-19 DIAGNOSIS — R531 Weakness: Secondary | ICD-10-CM | POA: Diagnosis not present

## 2022-10-19 DIAGNOSIS — E785 Hyperlipidemia, unspecified: Secondary | ICD-10-CM | POA: Diagnosis not present

## 2022-10-19 DIAGNOSIS — Z8679 Personal history of other diseases of the circulatory system: Secondary | ICD-10-CM | POA: Diagnosis not present

## 2022-10-19 DIAGNOSIS — I272 Pulmonary hypertension, unspecified: Secondary | ICD-10-CM | POA: Diagnosis not present

## 2022-10-19 DIAGNOSIS — Z87442 Personal history of urinary calculi: Secondary | ICD-10-CM | POA: Diagnosis not present

## 2022-10-19 DIAGNOSIS — R059 Cough, unspecified: Secondary | ICD-10-CM | POA: Insufficient documentation

## 2022-10-19 DIAGNOSIS — R42 Dizziness and giddiness: Secondary | ICD-10-CM | POA: Diagnosis not present

## 2022-10-19 DIAGNOSIS — I130 Hypertensive heart and chronic kidney disease with heart failure and stage 1 through stage 4 chronic kidney disease, or unspecified chronic kidney disease: Secondary | ICD-10-CM | POA: Insufficient documentation

## 2022-10-19 DIAGNOSIS — Z7901 Long term (current) use of anticoagulants: Secondary | ICD-10-CM | POA: Diagnosis not present

## 2022-10-19 DIAGNOSIS — J90 Pleural effusion, not elsewhere classified: Secondary | ICD-10-CM | POA: Diagnosis not present

## 2022-10-19 DIAGNOSIS — I4891 Unspecified atrial fibrillation: Secondary | ICD-10-CM | POA: Insufficient documentation

## 2022-10-19 DIAGNOSIS — Z86711 Personal history of pulmonary embolism: Secondary | ICD-10-CM | POA: Diagnosis not present

## 2022-10-19 DIAGNOSIS — G479 Sleep disorder, unspecified: Secondary | ICD-10-CM | POA: Insufficient documentation

## 2022-10-19 DIAGNOSIS — D649 Anemia, unspecified: Secondary | ICD-10-CM | POA: Insufficient documentation

## 2022-10-19 DIAGNOSIS — K219 Gastro-esophageal reflux disease without esophagitis: Secondary | ICD-10-CM | POA: Diagnosis not present

## 2022-10-19 DIAGNOSIS — I5033 Acute on chronic diastolic (congestive) heart failure: Secondary | ICD-10-CM | POA: Diagnosis not present

## 2022-10-19 DIAGNOSIS — N189 Chronic kidney disease, unspecified: Secondary | ICD-10-CM | POA: Diagnosis not present

## 2022-10-19 DIAGNOSIS — R5383 Other fatigue: Secondary | ICD-10-CM | POA: Insufficient documentation

## 2022-10-19 DIAGNOSIS — Z87891 Personal history of nicotine dependence: Secondary | ICD-10-CM | POA: Insufficient documentation

## 2022-10-19 DIAGNOSIS — Z85528 Personal history of other malignant neoplasm of kidney: Secondary | ICD-10-CM | POA: Diagnosis not present

## 2022-10-19 DIAGNOSIS — I10 Essential (primary) hypertension: Secondary | ICD-10-CM

## 2022-10-19 DIAGNOSIS — I4811 Longstanding persistent atrial fibrillation: Secondary | ICD-10-CM

## 2022-10-19 NOTE — Patient Instructions (Addendum)
We are putting furoscix on you today. After 5 hours, remove the device. We are using this in place of the fluid pills for today   Do NOT take any of your fluid pills until we see you tomorrow in case we need to repeat this device.    Check your blood pressure while using the furoscix and if the top number is less than 100, please take the midodrine.

## 2022-10-19 NOTE — Progress Notes (Signed)
Provided patient education on Furoscix using demo kits and Furoscix video, QR code provided.   Furoscix placed. Education given to pt and son with teach back to remove in 5 hours . Pt aware, agreeable, and verbalized understanding.

## 2022-10-19 NOTE — Progress Notes (Signed)
   10/19/22 1521  ReDS Vest / Clip  Station Marker C  Ruler Value 27  ReDS Value Range < 36  ReDS Actual Value 30

## 2022-10-19 NOTE — Progress Notes (Signed)
Patient ID: Marcus Kemp, male    DOB: 11-22-1924, 89 y.o.   MRN: [id number]  HPI  Marcus Kemp is a 89 y/o male with a history of AAA, hyperlipidemia, HTN, CKD, atrial fibrillation, anemia, GERD,   Echo 09/10/22: EF of 55-60% along with moderate LVH along with severely elevated PA pressure of 70.4 mmHg, moderate LAE and moderate/ severe Marcus/TR.   Admitted 09/09/22 due to pedal edema due to HF exacerbation. Diuresed. D-dimer elevated and CTA + for PE with right heart strain RV:LV 1.8, cardiac enlargement with bilateral pleural effusions and probable interstitial edema. Heparin gtt and then placed on eliquis. Midodrine started.   He presents today for an acute visit with a chief complaint of moderate SOB. Chronic in nature although reports worsening SOB over the last 3-4 days. Has associated fatigue, dry cough, pedal edema (worsening), dizziness, weakness, weight gain and difficulty sleeping due to orthopnea. Denies abdominal distention, palpitations or chest pain.   Has gained ~ 16 pounds in the last week. Has orthopnea where he has to sleep more upright. When he saw his PCP 10/13/22, his lasix was stopped due to worsening renal function. Ever since then, his symptoms have progressively worsened.    Past Medical History:  Diagnosis Date   A-fib [facility])    a.) CHA2DS2-VASc = 4 (age x 2, CHF, aortic plaque). b.) s/p unsuccessful TEE cardioversion 04/28/2016 --> rec'd 200 J x1 and 360 J x 2. c.) rate/rhythm maintained on oral metoprolol succinante; chronically anticoagulated using dose reduced apixaban.   Anemia    Aortic atherosclerosis (HCC)    Arthritis    CHF (congestive heart failure) ([place])    a.) TTE 04/26/2016: EF >55%; mod LVH; MVP; mild-mod LAE, mild Marcus. b.) TTE 05/14/209: EF >55%; mod RVE; mild BAE; mod Marcus.TR; triv AR/PR. c.) TTE 03/28/2019: EF >55%; mild LVH; mod BAE; mild PR, mod Marcus/TR.   GERD (gastroesophageal reflux disease)    Heart murmur    History of kidney stones    HOH (hard  of hearing)    a.) hearing aid to LEFT ear.   Hyperlipidemia    Hypertension    Infrarenal abdominal aortic aneurysm (AAA) without rupture ([place]) 05/05/2021   a.) CT 05/05/2021 --> measured 3.0 cm.   MVP (mitral valve prolapse)    Peripheral edema    Pneumonia    Pulmonary embolus ([place])    Pulmonary HTN ([place]) 04/26/2016   a.) TTE 04/26/2016: EF >55%; PASP 45. b.) TTE 12/06/2017: EF >55%; RVSP 76.6 mmHg. c.) TTE 03/28/2019: EF >55%; RVSP 77.8 mmHg.   Renal cell carcinoma (HCC)    Rheumatic fever    Sepsis ([place])    Valvular heart disease    Past Surgical History:  Procedure Laterality Date   INGUINAL HERNIA REPAIR Left 01/13/2022   Procedure: HERNIA REPAIR INGUINAL ADULT;  Surgeon: Robert Bellow, MD;  Location: [facility] ORS;  Service: General;  Laterality: Left;   INSERTION OF MESH  01/13/2022   Procedure: INSERTION OF MESH;  Surgeon: Robert Bellow, MD;  Location: [facility] ORS;  Service: General;;   LITHOTRIPSY     TEE WITH CARDIOVERSION N/A    P   TONSILLECTOMY     Family History  Problem Relation Age of Onset   Heart disease Sister    Hypertension Other    Social History   Tobacco Use   Smoking status: Former    Packs/day: 0.25    Years: 8.00    Additional pack years: 0.00  Total pack years: 2.00    Types: Cigarettes    Quit date: 99    Years since quitting: 84.2   Smokeless tobacco: Never  Substance Use Topics   Alcohol use: No   Allergies  Allergen Reactions   Penicillins Anaphylaxis   Prior to Admission medications   Medication Sig Start Date End Date Taking? Authorizing Provider  APIXABAN Arne Cleveland) VTE STARTER PACK (10MG  AND 5MG ) Take as directed on package: start with two-5mg  tablets twice daily for 7 days. On day 8, switch to one-5mg  tablet twice daily. 09/14/22  Yes Val Riles, MD  cetirizine (ZYRTEC) 10 MG tablet SMARTSIG:1 pill By Mouth Daily PRN 12/13/21  Yes [provider]  metoprolol succinate (TOPROL-XL) 25 MG 24 hr tablet Take 25 mg  by mouth every morning. 05/03/21  Yes [provider]  midodrine (PROAMATINE) 5 MG tablet Take 5 mg by mouth as needed. For SBP >100   Yes [provider]  rOPINIRole (REQUIP) 0.25 MG tablet Take 1 tablet (0.25 mg total) by mouth at bedtime. 09/15/22 12/14/22 Yes Val Riles, MD  traZODone (DESYREL) 50 MG tablet Take 50 mg by mouth at bedtime.   Yes [provider]  vitamin B-12 (CYANOCOBALAMIN) 500 MCG tablet Take 1 tablet (500 mcg total) by mouth daily. 12/29/21  Yes Earlie Server, MD  acetaminophen (TYLENOL) 500 MG tablet Take 1,000 mg by mouth at bedtime. Patient not taking: Reported on 10/19/2022    [provider]  Azelastine HCl 137 MCG/SPRAY SOLN SMARTSIG:2 Puff(s) Both Nares Twice Daily 12/08/21   [provider]  furosemide (LASIX) 20 MG tablet Take 2 tablets (40 mg total) by mouth every morning. Patient not taking: Reported on 10/19/2022 09/15/22 12/14/22  Val Riles, MD    Review of Systems  Constitutional:  Positive for fatigue. Negative for appetite change.  HENT:  Positive for hearing loss. Negative for congestion and sore throat.   Eyes: Negative.   Respiratory:  Positive for cough (dry) and shortness of breath. Negative for chest tightness.   Cardiovascular:  Positive for leg swelling (last 3-4 days). Negative for chest pain and palpitations.  Gastrointestinal:  Negative for abdominal distention and abdominal pain.  Endocrine: Negative.   Genitourinary: Negative.   Musculoskeletal:  Negative for back pain and neck pain.  Skin: Negative.   Allergic/Immunologic: Negative.   Neurological:  Positive for dizziness and weakness. Negative for light-headedness.  Hematological:  Negative for adenopathy. Does not bruise/bleed easily.  Psychiatric/Behavioral:  Positive for sleep disturbance (+ orthopnea). Negative for dysphoric mood. The patient is not nervous/anxious.    Vitals:   10/19/22 1459  BP: 103/68  Pulse: (!) 59  SpO2: 98%  Weight:  144 lb (65.3 kg)   Wt Readings from Last 3 Encounters:  10/19/22 144 lb (65.3 kg)  09/20/22 128 lb 4 oz (58.2 kg)  09/15/22 128 lb 1.4 oz (58.1 kg)   Lab Results  Component Value Date   CREATININE 0.80 09/15/2022   CREATININE 0.97 09/14/2022   CREATININE 0.92 09/13/2022   Physical Exam Vitals and nursing note reviewed. Exam conducted with a chaperone present (son).  Constitutional:      Appearance: Normal appearance.  HENT:     Head: Normocephalic and atraumatic.     Right Ear: Decreased hearing noted.     Left Ear: Decreased hearing noted.  Cardiovascular:     Rate and Rhythm: Bradycardia present. Rhythm irregular.  Pulmonary:     Effort: Pulmonary effort is normal. No respiratory distress.  Breath sounds: No wheezing or rales.  Abdominal:     General: There is no distension.     Palpations: Abdomen is soft.     Tenderness: There is no abdominal tenderness.  Musculoskeletal:        General: No tenderness.     Cervical back: Normal range of motion and neck supple.     Right lower leg: Edema (2+ pitting to knee) present.     Left lower leg: Edema (2+ pitting to knee) present.  Skin:    General: Skin is warm and dry.  Neurological:     General: No focal deficit present.     Mental Status: He is alert and oriented to person, place, and time.  Psychiatric:        Mood and Affect: Mood normal.        Behavior: Behavior normal.        Thought Content: Thought content normal.   Assessment & Plan:  1: Acute on Chronic heart failure with preserved ejection fraction with LVH- - NYHA class III - fluid overloaded with weight gain and worsening symptoms since diuretic was stopped 1 week ago - weighing daily; reminded to call for an overnight weight gain of > 2 pounds or a weekly weight gain of > 5 pounds - weight up 16 pounds from last visit here 1 month ago - echo 09/10/22: EF of 55-60% along with moderate LV along with severely elevated PA pressure of 70.4 mmHg, moderate  LAE and moderate/ severe Marcus/TR.  - after multiple attempts, ReDs reading was 30% - not adding salt to his food - metoprolol succinate 25mg  daily - furoscix was applied in the office by RN with instructions to remove this after 5 hours - do not take furosemide tomorrow until after he is seen back in the office - BMP tomorrow; consider more furoscix or resuming oral furosemide - BNP 09/09/22 was 1637.8  2: HTN- - BP 103/68 - midodrine 5mg  TID PRN for SBP <100; hasn't had to take this recently - saw PCP ([facility]) 10/13/22 - BMP 09/15/22 showed sodium 135, potassium 3.7, creatinine 0.8 and GFR >60  3: Atrial fibrillation-  - saw cardiology Margarito Courser) 07/14/22 - continue eliquis 5mg  BID - ablation done 04/2016   Medication list reviewed.   Return tomorrow.

## 2022-10-20 ENCOUNTER — Ambulatory Visit (HOSPITAL_BASED_OUTPATIENT_CLINIC_OR_DEPARTMENT_OTHER): Payer: Medicare Other | Admitting: Family

## 2022-10-20 ENCOUNTER — Encounter: Payer: Self-pay | Admitting: Family

## 2022-10-20 ENCOUNTER — Other Ambulatory Visit
Admission: RE | Admit: 2022-10-20 | Discharge: 2022-10-20 | Disposition: A | Discharge status: Home / Self Care | Payer: Medicare Other | Source: Ambulatory Visit | Attending: Family | Admitting: Family

## 2022-10-20 VITALS — BP 105/60 | HR 55 | Wt 140.0 lb

## 2022-10-20 DIAGNOSIS — I4811 Longstanding persistent atrial fibrillation: Secondary | ICD-10-CM

## 2022-10-20 DIAGNOSIS — I5033 Acute on chronic diastolic (congestive) heart failure: Secondary | ICD-10-CM | POA: Insufficient documentation

## 2022-10-20 DIAGNOSIS — I10 Essential (primary) hypertension: Secondary | ICD-10-CM | POA: Diagnosis not present

## 2022-10-20 LAB — BASIC METABOLIC PANEL
Anion gap: 10 (ref 5–15)
BUN: 39 mg/dL — ABNORMAL HIGH (ref 8–23)
CO2: 29 mmol/L (ref 22–32)
Calcium: 9 mg/dL (ref 8.9–10.3)
Chloride: 97 mmol/L — ABNORMAL LOW (ref 98–111)
Creatinine, Ser: 1.55 mg/dL — ABNORMAL HIGH (ref 0.61–1.24)
GFR, Estimated: 41 mL/min — ABNORMAL LOW (ref >60)
Glucose, Bld: 91 mg/dL (ref 70–99)
Potassium: 4.3 mmol/L (ref 3.5–5.1)
Sodium: 136 mmol/L (ref 135–145)

## 2022-10-20 NOTE — Patient Instructions (Signed)
Take furoscix off after 5 hours. Do not take your fluid pill until you see me tomorrow.

## 2022-10-20 NOTE — Progress Notes (Signed)
Patient ID: Marcus Kemp, male    DOB: [birth year], 89 y.o.   MRN: [id number]  HPI  [name] is a 89 y/o male with a history of AAA, hyperlipidemia, HTN, CKD, atrial fibrillation, anemia, GERD,   Echo 09/10/22: EF of 55-60% along with moderate LVH along with severely elevated PA pressure of 70.4 mmHg, moderate LAE and moderate/ severe MR/TR.   Admitted 09/09/22 due to pedal edema due to HF exacerbation. Diuresed. D-dimer elevated and CTA + for PE with right heart strain RV:LV 1.8, cardiac enlargement with bilateral pleural effusions and probable interstitial edema. Heparin gtt and then placed on eliquis. Midodrine started.   He presents today for a HF f/u visit with a chief complaint of moderate SOB w/ minimal exertion. Chronic in nature. Has associated fatigue, dry cough, pedal edema (improving), dizziness, weakness & difficulty sleeping d/t urination along with this. Denies abdominal distention, palpitations, chest pain, orthopnea or weight gain.   Furoscix used yesterday & he says that he feels better from yesterday & no longer has orthopnea.   Had gained ~ 16 pounds in the last week. When he saw his PCP 10/13/22, his lasix was stopped due to worsening renal function. Ever since then, his symptoms have progressively worsened.    Past Medical History:  Diagnosis Date   A-fib [facility])    a.) CHA2DS2-VASc = 4 (age x 2, CHF, aortic plaque). b.) s/p unsuccessful TEE cardioversion 04/28/2016 --> rec'd 200 J x1 and 360 J x 2. c.) rate/rhythm maintained on oral metoprolol succinante; chronically anticoagulated using dose reduced apixaban.   Anemia    Aortic atherosclerosis (HCC)    Arthritis    CHF (congestive heart failure) ([place])    a.) TTE 04/26/2016: EF >55%; mod LVH; MVP; mild-mod LAE, mild MR. b.) TTE 05/14/209: EF >55%; mod RVE; mild BAE; mod MR.TR; triv AR/PR. c.) TTE 03/28/2019: EF >55%; mild LVH; mod BAE; mild PR, mod MR/TR.   GERD (gastroesophageal reflux disease)    Heart murmur     History of kidney stones    HOH (hard of hearing)    a.) hearing aid to LEFT ear.   Hyperlipidemia    Hypertension    Infrarenal abdominal aortic aneurysm (AAA) without rupture ([place]) 05/05/2021   a.) CT 05/05/2021 --> measured 3.0 cm.   MVP (mitral valve prolapse)    Peripheral edema    Pneumonia    Pulmonary embolus ([place])    Pulmonary HTN ([place]) 04/26/2016   a.) TTE 04/26/2016: EF >55%; PASP 45. b.) TTE 12/06/2017: EF >55%; RVSP 76.6 mmHg. c.) TTE 03/28/2019: EF >55%; RVSP 77.8 mmHg.   Renal cell carcinoma (HCC)    Rheumatic fever    Sepsis ([place])    Valvular heart disease    Past Surgical History:  Procedure Laterality Date   INGUINAL HERNIA REPAIR Left 01/13/2022   Procedure: HERNIA REPAIR INGUINAL ADULT;  Surgeon: Robert Bellow, MD;  Location: [facility] ORS;  Service: General;  Laterality: Left;   INSERTION OF MESH  01/13/2022   Procedure: INSERTION OF MESH;  Surgeon: Robert Bellow, MD;  Location: [facility] ORS;  Service: General;;   LITHOTRIPSY     TEE WITH CARDIOVERSION N/A    P   TONSILLECTOMY     Family History  Problem Relation Age of Onset   Heart disease Sister    Hypertension Other    Social History   Tobacco Use   Smoking status: Former    Packs/day: 0.25    Years: 8.00  Additional pack years: 0.00    Total pack years: 2.00    Types: Cigarettes    Quit date: 66    Years since quitting: 84.2   Smokeless tobacco: Never  Substance Use Topics   Alcohol use: No   Allergies  Allergen Reactions   Penicillins Anaphylaxis   Prior to Admission medications   Medication Sig Start Date End Date Taking? Authorizing Provider  acetaminophen (TYLENOL) 500 MG tablet Take 1,000 mg by mouth at bedtime.    [provider]  APIXABAN Arne Cleveland) VTE STARTER PACK (10MG  AND 5MG ) Take as directed on package: start with two-5mg  tablets twice daily for 7 days. On day 8, switch to one-5mg  tablet twice daily. 09/14/22   Val Riles, MD  Azelastine HCl 137 MCG/SPRAY  SOLN SMARTSIG:2 Puff(s) Both Nares Twice Daily 12/08/21   [provider]  cetirizine (ZYRTEC) 10 MG tablet SMARTSIG:1 pill By Mouth Daily PRN 12/13/21   [provider]  furosemide (LASIX) 20 MG tablet Take 2 tablets (40 mg total) by mouth every morning. Patient not taking: Reported on 10/19/2022 09/15/22 12/14/22  Val Riles, MD  metoprolol succinate (TOPROL-XL) 25 MG 24 hr tablet Take 25 mg by mouth every morning. 05/03/21   [provider]  midodrine (PROAMATINE) 5 MG tablet Take 5 mg by mouth as needed. For SBP >100    [provider]  rOPINIRole (REQUIP) 0.25 MG tablet Take 1 tablet (0.25 mg total) by mouth at bedtime. 09/15/22 12/14/22  Val Riles, MD  traZODone (DESYREL) 50 MG tablet Take 50 mg by mouth at bedtime.    [provider]  vitamin B-12 (CYANOCOBALAMIN) 500 MCG tablet Take 1 tablet (500 mcg total) by mouth daily. 12/29/21   Earlie Server, MD   Review of Systems  Constitutional:  Positive for fatigue. Negative for appetite change.  HENT:  Positive for hearing loss. Negative for congestion and sore throat.   Eyes: Negative.   Respiratory:  Positive for cough (dry) and shortness of breath (improving). Negative for chest tightness.   Cardiovascular:  Positive for leg swelling ("improving"). Negative for chest pain and palpitations.  Gastrointestinal:  Negative for abdominal distention and abdominal pain.  Endocrine: Negative.   Genitourinary: Negative.   Musculoskeletal:  Negative for back pain and neck pain.  Skin: Negative.   Allergic/Immunologic: Negative.   Neurological:  Positive for dizziness and weakness. Negative for light-headedness.  Hematological:  Negative for adenopathy. Does not bruise/bleed easily.  Psychiatric/Behavioral:  Positive for sleep disturbance (+ orthopnea). Negative for dysphoric mood. The patient is not nervous/anxious.    Vitals:   10/20/22 1230  BP: 105/60  Pulse: (!) 55  SpO2: 96%  Weight: 140 lb (63.5  kg)   Wt Readings from Last 3 Encounters:  10/20/22 140 lb (63.5 kg)  10/19/22 144 lb (65.3 kg)  09/20/22 128 lb 4 oz (58.2 kg)   Lab Results  Component Value Date   CREATININE 1.55 (H) 10/20/2022   CREATININE 0.80 09/15/2022   CREATININE 0.97 09/14/2022   Physical Exam Vitals and nursing note reviewed. Exam conducted with a chaperone present (wife).  Constitutional:      Appearance: Normal appearance.  HENT:     Head: Normocephalic and atraumatic.     Right Ear: Decreased hearing noted.     Left Ear: Decreased hearing noted.  Cardiovascular:     Rate and Rhythm: Bradycardia present. Rhythm irregular.  Pulmonary:     Effort: Pulmonary effort is normal. No respiratory distress.  Breath sounds: No wheezing or rales.  Abdominal:     General: There is no distension.     Palpations: Abdomen is soft.     Tenderness: There is no abdominal tenderness.  Musculoskeletal:        General: No tenderness.     Cervical back: Normal range of motion and neck supple.     Right lower leg: Edema (2+ pitting to knee) present.     Left lower leg: Edema (2+ pitting to knee) present.  Skin:    General: Skin is warm and dry.  Neurological:     General: No focal deficit present.     Mental Status: He is alert and oriented to person, place, and time.  Psychiatric:        Mood and Affect: Mood normal.        Behavior: Behavior normal.        Thought Content: Thought content normal.   Assessment & Plan:  1: Acute on Chronic heart failure with preserved ejection fraction with LVH- - NYHA class III - fluid overloaded although improving - weighing daily; reminded to call for an overnight weight gain of > 2 pounds or a weekly weight gain of > 5 pounds - weight down 4 pounds from last visit here yesterday - echo 09/10/22: EF of 55-60% along with moderate LV along with severely elevated PA pressure of 70.4 mmHg, moderate LAE and moderate/ severe MR/TR.  - not adding salt to his food - metoprolol  succinate 25mg  daily - furosemide 40mg  daily; continues to be on hold - furoscix was used yesterday; will repeat this today and furoscix was applied in office by CMA, advised wife to remove this after 5 hours - instructed to not take his fluid pill today or tomorrow until seen back in the office tomorrow - BMP today - BNP 09/09/22 was 1637.8  2: HTN- - BP 105/60 - midodrine 5mg  TID PRN for SBP <100; hasn't had to take this recently - saw PCP ([facility]) 10/13/22 - BMP 10/13/22 showed sodium 137, potassium 5.2, creatinine 1.7 & GFR 37  3: Atrial fibrillation-  - saw cardiology Margarito Courser) 07/14/22 - continue eliquis 5mg  BID - ablation done 04/2016  Return tomorrow. Will recheck BMP as well as do BNP tomorrow.

## 2022-10-20 NOTE — Progress Notes (Signed)
Medication Samples have been provided to the patient.  Drug name: Furoscix       Strength: 80mg         Qty: 1  [id number]  Exp.Date: 03/25/2024  Dosing instructions: apply one body infuser as directed.  The patient has been instructed regarding the correct time, dose, and frequency of taking this medication, including desired effects and most common side effects.   Adolm Joseph Q 1:08 PM 10/20/2022    Clinic administered Furoscix.

## 2022-10-21 ENCOUNTER — Other Ambulatory Visit
Admission: RE | Admit: 2022-10-21 | Discharge: 2022-10-21 | Disposition: A | Discharge status: Home / Self Care | Payer: Medicare Other | Source: Ambulatory Visit | Attending: Family | Admitting: Family

## 2022-10-21 ENCOUNTER — Telehealth: Payer: Self-pay

## 2022-10-21 ENCOUNTER — Ambulatory Visit (HOSPITAL_BASED_OUTPATIENT_CLINIC_OR_DEPARTMENT_OTHER): Payer: Medicare Other | Admitting: Family

## 2022-10-21 ENCOUNTER — Encounter: Payer: Self-pay | Admitting: Family

## 2022-10-21 VITALS — BP 93/65 | HR 65 | Wt 143.3 lb

## 2022-10-21 DIAGNOSIS — I10 Essential (primary) hypertension: Secondary | ICD-10-CM

## 2022-10-21 DIAGNOSIS — I5032 Chronic diastolic (congestive) heart failure: Secondary | ICD-10-CM

## 2022-10-21 LAB — BASIC METABOLIC PANEL
Anion gap: 11 (ref 5–15)
BUN: 44 mg/dL — ABNORMAL HIGH (ref 8–23)
CO2: 29 mmol/L (ref 22–32)
Calcium: 8.7 mg/dL — ABNORMAL LOW (ref 8.9–10.3)
Chloride: 95 mmol/L — ABNORMAL LOW (ref 98–111)
Creatinine, Ser: 1.38 mg/dL — ABNORMAL HIGH (ref 0.61–1.24)
GFR, Estimated: 47 mL/min — ABNORMAL LOW (ref >60)
Glucose, Bld: 112 mg/dL — ABNORMAL HIGH (ref 70–99)
Potassium: 3.7 mmol/L (ref 3.5–5.1)
Sodium: 135 mmol/L (ref 135–145)

## 2022-10-21 LAB — BRAIN NATRIURETIC PEPTIDE: B Natriuretic Peptide: 1646.6 pg/mL — ABNORMAL HIGH (ref 0.0–100.0)

## 2022-10-21 NOTE — Patient Instructions (Signed)
Go to the [facility] and get labs.

## 2022-10-21 NOTE — Telephone Encounter (Signed)
Julianne Handler, RN      Pt wife Inez Catalina) aware, agreeable, and verbalized understanding.       Alisa Graff, FNP      Kidney function continues to improve. Resume taking 40mg  furosemide daily   Try to wear compression socks daily and elevate your legs   Make sure you take midodrine if your BP is <100

## 2022-10-21 NOTE — Progress Notes (Signed)
Patient ID: Marcus Kemp, male    DOB: 12-26-1924, 89 y.o.   MRN: [id number]  HPI  Marcus Kemp is a 89 y/o male with a history of AAA, hyperlipidemia, HTN, CKD, atrial fibrillation, anemia, GERD,   Echo 09/10/22: EF of 55-60% along with moderate LVH along with severely elevated PA pressure of 70.4 mmHg, moderate LAE and moderate/ severe Marcus/TR.   Admitted 09/09/22 due to pedal edema due to HF exacerbation. Diuresed. D-dimer elevated and CTA + for PE with right heart strain RV:LV 1.8, cardiac enlargement with bilateral pleural effusions and probable interstitial edema. Heparin gtt and then placed on eliquis. Midodrine started.   He presents today for a HF f/u visit with a chief complaint of moderate SOB w/ little exertion. Chronic in nature but he says that he feels like he's breathing better especially when he lays down. Has fatigue, dry cough, pedal edema (improving), weakness and slight weight gain. Denies difficulty sleeping, dizziness, abdominal distention, palpitations or chest pain.   Furoscix used yesterday for the 2nd time. Did gain a few pounds but says that he feels better and that the swelling is also better.   Had gained ~ 16 pounds in the last week. When he saw his PCP 10/13/22, his lasix was stopped due to worsening renal function. Ever since then, his symptoms have progressively worsened.    Past Medical History:  Diagnosis Date   A-fib [facility])    a.) CHA2DS2-VASc = 4 (age x 2, CHF, aortic plaque). b.) s/p unsuccessful TEE cardioversion 04/28/2016 --> rec'd 200 J x1 and 360 J x 2. c.) rate/rhythm maintained on oral metoprolol succinante; chronically anticoagulated using dose reduced apixaban.   Anemia    Aortic atherosclerosis (HCC)    Arthritis    CHF (congestive heart failure) ([place])    a.) TTE 04/26/2016: EF >55%; mod LVH; MVP; mild-mod LAE, mild Marcus. b.) TTE 05/14/209: EF >55%; mod RVE; mild BAE; mod Marcus.TR; triv AR/PR. c.) TTE 03/28/2019: EF >55%; mild LVH; mod BAE; mild PR,  mod Marcus/TR.   GERD (gastroesophageal reflux disease)    Heart murmur    History of kidney stones    HOH (hard of hearing)    a.) hearing aid to LEFT ear.   Hyperlipidemia    Hypertension    Infrarenal abdominal aortic aneurysm (AAA) without rupture ([place]) 05/05/2021   a.) CT 05/05/2021 --> measured 3.0 cm.   MVP (mitral valve prolapse)    Peripheral edema    Pneumonia    Pulmonary embolus ([place])    Pulmonary HTN ([place]) 04/26/2016   a.) TTE 04/26/2016: EF >55%; PASP 45. b.) TTE 12/06/2017: EF >55%; RVSP 76.6 mmHg. c.) TTE 03/28/2019: EF >55%; RVSP 77.8 mmHg.   Renal cell carcinoma (HCC)    Rheumatic fever    Sepsis ([place])    Valvular heart disease    Past Surgical History:  Procedure Laterality Date   INGUINAL HERNIA REPAIR Left 01/13/2022   Procedure: HERNIA REPAIR INGUINAL ADULT;  Surgeon: Robert Bellow, MD;  Location: [facility] ORS;  Service: General;  Laterality: Left;   INSERTION OF MESH  01/13/2022   Procedure: INSERTION OF MESH;  Surgeon: Robert Bellow, MD;  Location: [facility] ORS;  Service: General;;   LITHOTRIPSY     TEE WITH CARDIOVERSION N/A    P   TONSILLECTOMY     Family History  Problem Relation Age of Onset   Heart disease Sister    Hypertension Other    Social History  Tobacco Use   Smoking status: Former    Packs/day: 0.25    Years: 8.00    Additional pack years: 0.00    Total pack years: 2.00    Types: Cigarettes    Quit date: 60    Years since quitting: 84.2   Smokeless tobacco: Never  Substance Use Topics   Alcohol use: No   Allergies  Allergen Reactions   Penicillins Anaphylaxis   Prior to Admission medications   Medication Sig Start Date End Date Taking? Authorizing Provider  APIXABAN (ELIQUIS) VTE STARTER PACK (10MG  AND 5MG ) Take as directed on package: start with two-5mg  tablets twice daily for 7 days. On day 8, switch to one-5mg  tablet twice daily. 09/14/22  Yes Val Riles, MD  cetirizine (ZYRTEC) 10 MG tablet SMARTSIG:1 pill By Mouth  Daily PRN 12/13/21  Yes [provider]  metoprolol succinate (TOPROL-XL) 25 MG 24 hr tablet Take 25 mg by mouth every morning. 05/03/21  Yes [provider]  vitamin B-12 (CYANOCOBALAMIN) 500 MCG tablet Take 1 tablet (500 mcg total) by mouth daily. 12/29/21  Yes Earlie Server, MD  acetaminophen (TYLENOL) 500 MG tablet Take 1,000 mg by mouth at bedtime.    [provider]  Azelastine HCl 137 MCG/SPRAY SOLN SMARTSIG:2 Puff(s) Both Nares Twice Daily 12/08/21   [provider]  furosemide (LASIX) 20 MG tablet Take 2 tablets (40 mg total) by mouth every morning. Patient not taking: Reported on 10/19/2022 09/15/22 12/14/22  Val Riles, MD  midodrine (PROAMATINE) 5 MG tablet Take 5 mg by mouth as needed. For SBP >100    [provider]  rOPINIRole (REQUIP) 0.25 MG tablet Take 1 tablet (0.25 mg total) by mouth at bedtime. 09/15/22 12/14/22  Val Riles, MD  traZODone (DESYREL) 50 MG tablet Take 50 mg by mouth at bedtime.    [provider]    Review of Systems  Constitutional:  Positive for fatigue. Negative for appetite change (improving).  HENT:  Positive for hearing loss. Negative for congestion and sore throat.   Eyes: Negative.   Respiratory:  Positive for cough (dry; improving) and shortness of breath (improving). Negative for chest tightness.   Cardiovascular:  Positive for leg swelling ("improving"). Negative for chest pain and palpitations.  Gastrointestinal:  Negative for abdominal distention and abdominal pain.  Endocrine: Negative.   Genitourinary: Negative.   Musculoskeletal:  Negative for back pain and neck pain.  Skin: Negative.   Allergic/Immunologic: Negative.   Neurological:  Positive for weakness. Negative for dizziness and light-headedness.  Hematological:  Negative for adenopathy. Does not bruise/bleed easily.  Psychiatric/Behavioral:  Negative for dysphoric mood and sleep disturbance ("much better"). The patient is not  nervous/anxious.    Vitals:   10/21/22 1008  BP: 93/65  Pulse: 65  SpO2: 96%  Weight: 143 lb 4.8 oz (65 kg)   Wt Readings from Last 3 Encounters:  10/21/22 143 lb 4.8 oz (65 kg)  10/20/22 140 lb (63.5 kg)  10/19/22 144 lb (65.3 kg)   Lab Results  Component Value Date   CREATININE 1.55 (H) 10/20/2022   CREATININE 0.80 09/15/2022   CREATININE 0.97 09/14/2022   Physical Exam Vitals and nursing note reviewed. Exam conducted with a chaperone present (wife).  Constitutional:      Appearance: Normal appearance.  HENT:     Head: Normocephalic and atraumatic.     Right Ear: Decreased hearing noted.     Left Ear: Decreased hearing noted.  Cardiovascular:     Rate and  Rhythm: Normal rate. Rhythm irregular.  Pulmonary:     Effort: Pulmonary effort is normal. No respiratory distress.     Breath sounds: No wheezing or rales.  Abdominal:     General: There is no distension.     Palpations: Abdomen is soft.     Tenderness: There is no abdominal tenderness.  Musculoskeletal:        General: No tenderness.     Cervical back: Normal range of motion and neck supple.     Right lower leg: Edema (1+ pitting to knee but softer) present.     Left lower leg: Edema (2+ pitting to knee but softer) present.  Skin:    General: Skin is warm and dry.  Neurological:     General: No focal deficit present.     Mental Status: He is alert and oriented to person, place, and time.  Psychiatric:        Mood and Affect: Mood normal.        Behavior: Behavior normal.        Thought Content: Thought content normal.   Assessment & Plan:  1: Chronic heart failure with preserved ejection fraction with LVH- - NYHA class III - fluid overloaded although improving - weighing daily; reminded to call for an overnight weight gain of > 2 pounds or a weekly weight gain of > 5 pounds - weight up 3 pounds from last visit here yesterday - echo 09/10/22: EF of 55-60% along with moderate LV along with severely  elevated PA pressure of 70.4 mmHg, moderate LAE and moderate/ severe Marcus/TR.  - not adding salt to his food - continue metoprolol succinate 25mg  daily - furosemide 40mg  daily; continues to be on hold - will call patient after we get lab results back regarding his oral diuretic - furoscix was used yesterday for 2nd day in a row - BMP/ BNP today - BNP 09/09/22 was 1637.8  2: HTN- - BP 93/65; home SBP has been >100 - midodrine 5mg  TID PRN for SBP <100 - saw PCP ([facility]) 10/13/22 - BMP 10/20/22 showed sodium 136, potassium 4.3, creatinine 1.55 & GFR 41  3: Atrial fibrillation-  - saw cardiology Margarito Courser) 07/14/22 - continue eliquis 5mg  BID - ablation done 04/2016   Return next week, sooner if needed.

## 2022-10-26 NOTE — Progress Notes (Unsigned)
Patient ID: Marcus Kemp, male    DOB: 06/25/1925, 89 y.o.   MRN: [id number]  HPI  Mr Paez is a 89 y/o male with a history of AAA, hyperlipidemia, HTN, CKD, atrial fibrillation, anemia, GERD,   Echo 09/10/22: EF of 55-60% along with moderate LVH along with severely elevated PA pressure of 70.4 mmHg, moderate LAE and moderate/ severe MR/TR.   Admitted 09/09/22 due to pedal edema due to HF exacerbation. Diuresed. D-dimer elevated and CTA + for PE with right heart strain RV:LV 1.8, cardiac enlargement with bilateral pleural effusions and probable interstitial edema. Heparin gtt and then placed on eliquis. Midodrine started.   He presents today for a HF f/u visit with a chief complaint of moderate SOB with little exertion. Chronic in nature although he feels like this is improving some. Has associated fatigue, cough, pedal edema, weakness and difficulty sleeping along with this. Denies dizziness, abdominal distention, palpitations, chest pain or weight gain.   Wife says that patient tends to sleep during the day which is impacting his ability to sleep at night.   SBP at home runs 104-105. It did drop below 100 yesterday and his wife did give him a dose of midodrine.   Past Medical History:  Diagnosis Date   A-fib [facility])    a.) CHA2DS2-VASc = 4 (age x 2, CHF, aortic plaque). b.) s/p unsuccessful TEE cardioversion 04/28/2016 --> rec'd 200 J x1 and 360 J x 2. c.) rate/rhythm maintained on oral metoprolol succinante; chronically anticoagulated using dose reduced apixaban.   Anemia    Aortic atherosclerosis (HCC)    Arthritis    CHF (congestive heart failure) ([place])    a.) TTE 04/26/2016: EF >55%; mod LVH; MVP; mild-mod LAE, mild MR. b.) TTE 05/14/209: EF >55%; mod RVE; mild BAE; mod MR.TR; triv AR/PR. c.) TTE 03/28/2019: EF >55%; mild LVH; mod BAE; mild PR, mod MR/TR.   GERD (gastroesophageal reflux disease)    Heart murmur    History of kidney stones    HOH (hard of hearing)    a.)  hearing aid to LEFT ear.   Hyperlipidemia    Hypertension    Infrarenal abdominal aortic aneurysm (AAA) without rupture ([place]) 05/05/2021   a.) CT 05/05/2021 --> measured 3.0 cm.   MVP (mitral valve prolapse)    Peripheral edema    Pneumonia    Pulmonary embolus ([place])    Pulmonary HTN ([place]) 04/26/2016   a.) TTE 04/26/2016: EF >55%; PASP 45. b.) TTE 12/06/2017: EF >55%; RVSP 76.6 mmHg. c.) TTE 03/28/2019: EF >55%; RVSP 77.8 mmHg.   Renal cell carcinoma (HCC)    Rheumatic fever    Sepsis ([place])    Valvular heart disease    Past Surgical History:  Procedure Laterality Date   INGUINAL HERNIA REPAIR Left 01/13/2022   Procedure: HERNIA REPAIR INGUINAL ADULT;  Surgeon: Robert Bellow, MD;  Location: [facility] ORS;  Service: General;  Laterality: Left;   INSERTION OF MESH  01/13/2022   Procedure: INSERTION OF MESH;  Surgeon: Robert Bellow, MD;  Location: [facility] ORS;  Service: General;;   LITHOTRIPSY     TEE WITH CARDIOVERSION N/A    P   TONSILLECTOMY     Family History  Problem Relation Age of Onset   Heart disease Sister    Hypertension Other    Social History   Tobacco Use   Smoking status: Former    Packs/day: 0.25    Years: 8.00    Additional pack years: 0.00  Total pack years: 2.00    Types: Cigarettes    Quit date: 79    Years since quitting: 84.3   Smokeless tobacco: Never  Substance Use Topics   Alcohol use: No   Allergies  Allergen Reactions   Penicillins Anaphylaxis   Prior to Admission medications   Medication Sig Start Date End Date Taking? Authorizing Provider  APIXABAN (ELIQUIS) VTE STARTER PACK (10MG  AND 5MG ) Take as directed on package: start with two-5mg  tablets twice daily for 7 days. On day 8, switch to one-5mg  tablet twice daily. 09/14/22  Yes Val Riles, MD  Azelastine HCl 137 MCG/SPRAY SOLN SMARTSIG:2 Puff(s) Both Nares Twice Daily 12/08/21  Yes [provider]  cetirizine (ZYRTEC) 10 MG tablet SMARTSIG:1 pill By Mouth Daily PRN 12/13/21   Yes [provider]  furosemide (LASIX) 20 MG tablet Take 2 tablets (40 mg total) by mouth every morning. 09/15/22 12/14/22 Yes Val Riles, MD  metoprolol succinate (TOPROL-XL) 25 MG 24 hr tablet Take 25 mg by mouth every morning. 05/03/21  Yes [provider]  midodrine (PROAMATINE) 5 MG tablet Take 5 mg by mouth as needed. For SBP >100   Yes [provider]  rOPINIRole (REQUIP) 1 MG tablet Take 1 tablet by mouth at bedtime. 10/13/22 10/13/23 Yes [provider]  traZODone (DESYREL) 50 MG tablet Take 50 mg by mouth at bedtime.   Yes [provider]  vitamin B-12 (CYANOCOBALAMIN) 500 MCG tablet Take 1 tablet (500 mcg total) by mouth daily. 12/29/21  Yes Earlie Server, MD  acetaminophen (TYLENOL) 500 MG tablet Take 1,000 mg by mouth at bedtime. Patient not taking: Reported on 10/27/2022    [provider]   Review of Systems  Constitutional:  Positive for fatigue. Negative for appetite change (improving).  HENT:  Positive for hearing loss. Negative for congestion and sore throat.   Eyes: Negative.   Respiratory:  Positive for cough (dry; improving) and shortness of breath (improving). Negative for chest tightness.   Cardiovascular:  Positive for leg swelling ("improving"). Negative for chest pain and palpitations.  Gastrointestinal:  Negative for abdominal distention and abdominal pain.  Endocrine: Negative.   Genitourinary: Negative.   Musculoskeletal:  Negative for back pain and neck pain.  Skin: Negative.   Allergic/Immunologic: Negative.   Neurological:  Positive for weakness. Negative for dizziness and light-headedness.  Hematological:  Negative for adenopathy. Does not bruise/bleed easily.  Psychiatric/Behavioral:  Positive for sleep disturbance (trouble sleeping last night). Negative for dysphoric mood. The patient is not nervous/anxious.    Vitals:   10/27/22 1059  BP: 90/60  Pulse: (!) 59  SpO2: 96%  Weight: 144 lb 6 oz (65.5 kg)    Wt Readings from Last 3 Encounters:  10/27/22 144 lb 6 oz (65.5 kg)  10/21/22 143 lb 4.8 oz (65 kg)  10/20/22 140 lb (63.5 kg)   Lab Results  Component Value Date   CREATININE 1.38 (H) 10/21/2022   CREATININE 1.55 (H) 10/20/2022   CREATININE 0.80 09/15/2022   Physical Exam Vitals and nursing note reviewed. Exam conducted with a chaperone present (wife).  Constitutional:      Appearance: Normal appearance.  HENT:     Head: Normocephalic and atraumatic.     Right Ear: Decreased hearing noted.     Left Ear: Decreased hearing noted.  Cardiovascular:     Rate and Rhythm: Bradycardia present. Rhythm irregular.  Pulmonary:     Effort: Pulmonary effort is normal. No respiratory distress.     Breath  sounds: No wheezing or rales.  Abdominal:     General: There is no distension.     Palpations: Abdomen is soft.     Tenderness: There is no abdominal tenderness.  Musculoskeletal:        General: No tenderness.     Cervical back: Normal range of motion and neck supple.     Right lower leg: Edema (2+ pitting to knee) present.     Left lower leg: Edema (1+ pitting to knee) present.  Skin:    General: Skin is warm and dry.  Neurological:     General: No focal deficit present.     Mental Status: He is alert and oriented to person, place, and time.  Psychiatric:        Mood and Affect: Mood normal.        Behavior: Behavior normal.        Thought Content: Thought content normal.   Assessment & Plan:  1: Chronic heart failure with preserved ejection fraction with LVH- - NYHA class III - euvolemic - weighing daily; reminded to call for an overnight weight gain of > 2 pounds or a weekly weight gain of > 5 pounds - weight stable from last visit here 1 week ago - echo 09/10/22: EF of 55-60% along with moderate LV along with severely elevated PA pressure of 70.4 mmHg, moderate LAE and moderate/ severe MR/TR.  - not adding salt to his food - continue metoprolol succinate 25mg  daily -  begin jardiance 10mg  daily; 3 weeks samples provided by PharmD - when starting this, only take the furosemide if needed for above weight gain or worsening edema - BMP today and then again at next visit - BNP 10/21/22 was 1646.6; recheck this today - PharmD reconciled meds w/ patient  2: HTN- - BP 90/60 - now will be taking furosemide PRN with starting jardiance - midodrine 5mg  TID PRN for SBP <100 - saw PCP ([facility]) 10/13/22 - BMP 10/21/22 showed sodium 135, potassium 3.7, creatinine 1.38 & GFR 47  3: Atrial fibrillation-  - saw cardiology Margarito Courser) 07/14/22 - continue eliquis 5mg  BID - ablation done 04/2016  Return in 3 weeks, sooner if needed.

## 2022-10-27 ENCOUNTER — Encounter: Payer: Self-pay | Admitting: Family

## 2022-10-27 ENCOUNTER — Ambulatory Visit (HOSPITAL_BASED_OUTPATIENT_CLINIC_OR_DEPARTMENT_OTHER): Payer: Medicare Other | Admitting: Family

## 2022-10-27 ENCOUNTER — Other Ambulatory Visit
Admission: RE | Admit: 2022-10-27 | Discharge: 2022-10-27 | Disposition: A | Discharge status: Home / Self Care | Payer: Medicare Other | Source: Ambulatory Visit | Attending: Family | Admitting: Family

## 2022-10-27 ENCOUNTER — Other Ambulatory Visit (HOSPITAL_COMMUNITY): Payer: Self-pay

## 2022-10-27 VITALS — BP 90/60 | HR 59 | Wt 144.4 lb

## 2022-10-27 DIAGNOSIS — I4811 Longstanding persistent atrial fibrillation: Secondary | ICD-10-CM

## 2022-10-27 DIAGNOSIS — I10 Essential (primary) hypertension: Secondary | ICD-10-CM | POA: Diagnosis not present

## 2022-10-27 DIAGNOSIS — I5032 Chronic diastolic (congestive) heart failure: Secondary | ICD-10-CM

## 2022-10-27 LAB — BASIC METABOLIC PANEL
Anion gap: 7 (ref 5–15)
BUN: 36 mg/dL — ABNORMAL HIGH (ref 8–23)
CO2: 30 mmol/L (ref 22–32)
Calcium: 8.9 mg/dL (ref 8.9–10.3)
Chloride: 98 mmol/L (ref 98–111)
Creatinine, Ser: 1.38 mg/dL — ABNORMAL HIGH (ref 0.61–1.24)
GFR, Estimated: 47 mL/min — ABNORMAL LOW (ref >60)
Glucose, Bld: 96 mg/dL (ref 70–99)
Potassium: 4 mmol/L (ref 3.5–5.1)
Sodium: 135 mmol/L (ref 135–145)

## 2022-10-27 LAB — BRAIN NATRIURETIC PEPTIDE: B Natriuretic Peptide: 1352.5 pg/mL — ABNORMAL HIGH (ref 0.0–100.0)

## 2022-10-27 MED ORDER — METOPROLOL SUCCINATE ER 25 MG PO TB24: 25.0000 mg | ORAL_TABLET | ORAL | 5 refills | Status: DC

## 2022-10-27 NOTE — Progress Notes (Signed)
Patient ID: Marcus Kemp, male   DOB: [birth year], 89 y.o.   MRN: [id number] [place] - PHARMACIST COUNSELING NOTE  HFpEF  Guideline-Directed Medical Therapy/Evidence Based Medicine  ACE/ARB/ARNI:  none as BP already low Beta Blocker: Metoprolol succinate 25 mg daily Aldosterone Antagonist:  none because can further reduce low BP Diuretic: Furosemide 40 mg QD SGLT2i:  none  Adherence Assessment  Do you ever forget to take your medication? [] Yes [x] No  Do you ever skip doses due to side effects? [] Yes [x] No  Do you have trouble affording your medicines? [] Yes [x] No  Are you ever unable to pick up your medication due to transportation difficulties? [] Yes [x] No  Do you ever stop taking your medications because you don't believe they are helping? [] Yes [x] No  Do you check your weight daily? [x] Yes [] No   Adherence strategy: pillbox  Barriers to obtaining medications: none  Vital signs: HR 59, BP 90/60, weight (pounds) 144 lb 6 oz  ECHO: Date 09/10/2022, EF 55 to 60%, notes moderate left ventricular hypertrophy      Latest Ref Rng & Units 10/21/2022   11:07 AM 10/20/2022    1:23 PM 09/15/2022    3:12 AM  BMP  Glucose 70 - 99 mg/dL 112  91  85   BUN 8 - 23 mg/dL 44  39  22   Creatinine 0.61 - 1.24 mg/dL 1.38  1.55  0.80   Sodium 135 - 145 mmol/L 135  136  135   Potassium 3.5 - 5.1 mmol/L 3.7  4.3  3.7   Chloride 98 - 111 mmol/L 95  97  98   CO2 22 - 32 mmol/L 29  29  28    Calcium 8.9 - 10.3 mg/dL 8.7  9.0  8.8     Past Medical History:  Diagnosis Date   A-fib ([place])    a.) CHA2DS2-VASc = 4 (age x 2, CHF, aortic plaque). b.) s/p unsuccessful TEE cardioversion 04/28/2016 --> rec'd 200 J x1 and 360 J x 2. c.) rate/rhythm maintained on oral metoprolol succinante; chronically anticoagulated using dose reduced apixaban.   Anemia    Aortic atherosclerosis (HCC)    Arthritis    CHF (congestive heart failure) ([place])    a.) TTE  04/26/2016: EF >55%; mod LVH; MVP; mild-mod LAE, mild MR. b.) TTE 05/14/209: EF >55%; mod RVE; mild BAE; mod MarcusTR; triv AR/PR. c.) TTE 03/28/2019: EF >55%; mild LVH; mod BAE; mild PR, mod MR/TR.   GERD (gastroesophageal reflux disease)    Heart murmur    History of kidney stones    HOH (hard of hearing)    a.) hearing aid to LEFT ear.   Hyperlipidemia    Hypertension    Infrarenal abdominal aortic aneurysm (AAA) without rupture ([place]) 05/05/2021   a.) CT 05/05/2021 --> measured 3.0 cm.   MVP (mitral valve prolapse)    Peripheral edema    Pneumonia    Pulmonary embolus ([place])    Pulmonary HTN ([place]) 04/26/2016   a.) TTE 04/26/2016: EF >55%; PASP 45. b.) TTE 12/06/2017: EF >55%; RVSP 76.6 mmHg. c.) TTE 03/28/2019: EF >55%; RVSP 77.8 mmHg.   Renal cell carcinoma (HCC)    Rheumatic fever    Sepsis ([place])    Valvular heart disease     ASSESSMENT Marcus Kemp is a 89 year old male who presents to the HF clinic for follow-up. Completed medication reconciliation during office visit. He reports weighing himself daily, no unwanted side effects  of his medications, and no problems getting his medications in regard to their cost. He reports using a pillbox and has no issues with adherence. At last follow-up on 3/28, his fluid status was noted to be improving after receiving 2nd dose of furoscix. His furosemide 40 mg daily was also restarted after previously being held due to worsening renal function.   Recent ED Visit (past 6 months): Date - 09/09/2022, CC - SOB and lower extremity edema  PLAN Recommendations: - Start empagliflozin 10 mg daily to help reduce disease progression.  - Reduce furosemide 40 mg daily to furosemide 40 mg daily PRN for 2 lb weight gain overnight or 5 lb weight gain in a week to prevent further reduction of already low blood pressure.  - Order BMP to ensure renal function is stable.   NP has agreed with the above recommendations.  Patient has been given 3 - 1 week samples of  empagliflozin 10 mg daily. If patient can tolerate the medication, we will pursue a grant to help with the affordability of empagliflozin. A BMP has been ordered today to monitor renal function.   Time spent: 15 minutes  Si Raider, [place].D Candidate, Class of 2026 10/27/2022 9:02 AM    Current Outpatient Medications:    acetaminophen (TYLENOL) 500 MG tablet, Take 1,000 mg by mouth at bedtime., Disp: , Rfl:    APIXABAN (ELIQUIS) VTE STARTER PACK (10MG  AND 5MG ), Take as directed on package: start with two-5mg  tablets twice daily for 7 days. On day 8, switch to one-5mg  tablet twice daily., Disp: 1 each, Rfl: 0   Azelastine HCl 137 MCG/SPRAY SOLN, SMARTSIG:2 Puff(s) Both Nares Twice Daily, Disp: , Rfl:    cetirizine (ZYRTEC) 10 MG tablet, SMARTSIG:1 pill By Mouth Daily PRN, Disp: , Rfl:    furosemide (LASIX) 20 MG tablet, Take 2 tablets (40 mg total) by mouth every morning. (Patient not taking: Reported on 10/19/2022), Disp: 60 tablet, Rfl: 2   metoprolol succinate (TOPROL-XL) 25 MG 24 hr tablet, Take 25 mg by mouth every morning., Disp: , Rfl:    midodrine (PROAMATINE) 5 MG tablet, Take 5 mg by mouth as needed. For SBP >100, Disp: , Rfl:    rOPINIRole (REQUIP) 0.25 MG tablet, Take 1 tablet (0.25 mg total) by mouth at bedtime., Disp: 30 tablet, Rfl: 2   traZODone (DESYREL) 50 MG tablet, Take 50 mg by mouth at bedtime., Disp: , Rfl:    vitamin B-12 (CYANOCOBALAMIN) 500 MCG tablet, Take 1 tablet (500 mcg total) by mouth daily., Disp: 90 tablet, Rfl: 3    MEDICATION ADHERENCES TIPS AND STRATEGIES Taking medication as prescribed improves patient outcomes in heart failure (reduces hospitalizations, improves symptoms, increases survival) Side effects of medications can be managed by decreasing doses, switching agents, stopping drugs, or adding additional therapy. Please let someone in the [facility] know if you have having bothersome side effects so we can modify your regimen. Do not  alter your medication regimen without talking to [place].  Medication reminders can help patients remember to take drugs on time. If you are missing or forgetting doses you can try linking behaviors, using pill boxes, or an electronic reminder like an alarm on your phone or an app. Some people can also get automated phone calls as medication reminders.

## 2022-10-27 NOTE — Patient Instructions (Addendum)
Starting tomorrow, you will start taking jardiance as 1 tablet every day.   When you start the jardiance, you will only take your fluid pill if you notice a weight gain of more than 3 pounds or if the swelling is getting worse.

## 2022-10-31 ENCOUNTER — Other Ambulatory Visit: Payer: Self-pay

## 2022-10-31 ENCOUNTER — Emergency Department
Admission: EM | Admit: 2022-10-31 | Discharge: 2022-10-31 | Disposition: A | Discharge status: Home / Self Care | Payer: Medicare Other | Attending: Emergency Medicine | Admitting: Emergency Medicine

## 2022-10-31 ENCOUNTER — Encounter: Payer: Self-pay | Admitting: Emergency Medicine

## 2022-10-31 ENCOUNTER — Emergency Department: Payer: Medicare Other

## 2022-10-31 DIAGNOSIS — I5030 Unspecified diastolic (congestive) heart failure: Secondary | ICD-10-CM | POA: Diagnosis not present

## 2022-10-31 DIAGNOSIS — N5089 Other specified disorders of the male genital organs: Secondary | ICD-10-CM | POA: Insufficient documentation

## 2022-10-31 DIAGNOSIS — M7989 Other specified soft tissue disorders: Secondary | ICD-10-CM | POA: Diagnosis present

## 2022-10-31 DIAGNOSIS — I110 Hypertensive heart disease with heart failure: Secondary | ICD-10-CM | POA: Diagnosis not present

## 2022-10-31 DIAGNOSIS — R600 Localized edema: Secondary | ICD-10-CM

## 2022-10-31 DIAGNOSIS — Z7901 Long term (current) use of anticoagulants: Secondary | ICD-10-CM | POA: Insufficient documentation

## 2022-10-31 DIAGNOSIS — E8770 Fluid overload, unspecified: Secondary | ICD-10-CM | POA: Diagnosis not present

## 2022-10-31 LAB — CBC
HCT: 37.7 % — ABNORMAL LOW (ref 39.0–52.0)
Hemoglobin: 12.2 g/dL — ABNORMAL LOW (ref 13.0–17.0)
MCH: 33.7 pg (ref 26.0–34.0)
MCHC: 32.4 g/dL (ref 30.0–36.0)
MCV: 104.1 fL — ABNORMAL HIGH (ref 80.0–100.0)
Platelets: 95 10*3/uL — ABNORMAL LOW (ref 150–400)
RBC: 3.62 MIL/uL — ABNORMAL LOW (ref 4.22–5.81)
RDW: 16.9 % — ABNORMAL HIGH (ref 11.5–15.5)
WBC: 5.2 10*3/uL (ref 4.0–10.5)
nRBC: 0 % (ref 0.0–0.2)

## 2022-10-31 LAB — BASIC METABOLIC PANEL
Anion gap: 9 (ref 5–15)
BUN: 37 mg/dL — ABNORMAL HIGH (ref 8–23)
CO2: 30 mmol/L (ref 22–32)
Calcium: 8.6 mg/dL — ABNORMAL LOW (ref 8.9–10.3)
Chloride: 97 mmol/L — ABNORMAL LOW (ref 98–111)
Creatinine, Ser: 1.4 mg/dL — ABNORMAL HIGH (ref 0.61–1.24)
GFR, Estimated: 46 mL/min — ABNORMAL LOW (ref >60)
Glucose, Bld: 101 mg/dL — ABNORMAL HIGH (ref 70–99)
Potassium: 4.1 mmol/L (ref 3.5–5.1)
Sodium: 136 mmol/L (ref 135–145)

## 2022-10-31 LAB — BRAIN NATRIURETIC PEPTIDE: B Natriuretic Peptide: 1945.2 pg/mL — ABNORMAL HIGH (ref 0.0–100.0)

## 2022-10-31 MED ORDER — FUROSEMIDE 10 MG/ML IJ SOLN
80.0000 mg | Freq: Once | INTRAMUSCULAR | Status: AC
  Administered 2022-10-31: 80 mg via INTRAVENOUS
  Filled 2022-10-31: qty 8

## 2022-10-31 NOTE — ED Provider Notes (Signed)
[facility] Provider Note    Event Date/Time   First MD Initiated Contact with Patient 10/31/22 1913     (approximate)   History   Groin Swelling and Leg Swelling   HPI  Marcus Kemp is a 89 y.o. male   Past medical history of diastolic heart failure, atrial fibrillation and PE on Eliquis, infrarenal AAA, hard of hearing, hypertension, who presents to the emergency department with bilateral leg swelling and scrotal edema worsening over the last several weeks despite adjustments of his Lasix by his family doctor.  He denies chest pain or shortness of breath.  He denies cough or fever.  He has been fully compliant with his anticoagulation and other medications.  Independent Historian contributed to assessment above: His spouse who is at bedside  External Medical Documents Reviewed: Discharge summary from February 2024 when he was admitted for CHF exacerbation and PE      Physical Exam   Triage Vital Signs: ED Triage Vitals  Enc Vitals Group     BP 10/31/22 1713 99/66     Pulse Rate 10/31/22 1713 73     Resp 10/31/22 1713 20     Temp 10/31/22 1713 98 F (36.7 C)     Temp src --      SpO2 10/31/22 1713 92 %     Weight 10/31/22 1715 142 lb (64.4 kg)     Height 10/31/22 1715 5\' 6"  (1.676 m)     Head Circumference --      Peak Flow --      Pain Score --      Pain Loc --      Pain Edu? --      Excl. in GC? --     Most recent vital signs: Vitals:   10/31/22 2130 10/31/22 2200  BP: 108/87 103/73  Pulse: 67 66  Resp: 16 16  Temp:    SpO2: 95% 95%    General: Awake, no distress.  CV:  Good peripheral perfusion.  Resp:  Normal effort.  Abd:  No distention.  Other:  He is very hard of hearing.  He has bilateral lower extremity pitting edema up to the mid thighs.  He has scrotal edema that is nontender to palpation without overlying skin changes or infectious changes.  His lungs are clear without focality or wheezing.  Vital signs  within normal limit no fever no respiratory distress, no hypoxemia   ED Results / Procedures / Treatments   Labs (all labs ordered are listed, but only abnormal results are displayed) Labs Reviewed  CBC - Abnormal; Notable for the following components:      Result Value   RBC 3.62 (*)    Hemoglobin 12.2 (*)    HCT 37.7 (*)    MCV 104.1 (*)    RDW 16.9 (*)    Platelets 95 (*)    All other components within normal limits  BASIC METABOLIC PANEL - Abnormal; Notable for the following components:   Chloride 97 (*)    Glucose, Bld 101 (*)    BUN 37 (*)    Creatinine, Ser 1.40 (*)    Calcium 8.6 (*)    GFR, Estimated 46 (*)    All other components within normal limits  BRAIN NATRIURETIC PEPTIDE - Abnormal; Notable for the following components:   B Natriuretic Peptide 1,945.2 (*)    All other components within normal limits     I ordered and reviewed the above labs they  are notable for elevated BNP at 1900 normal H&H and white blood cell count  EKG  ED ECG REPORT I, Pilar Jarvis, the attending physician, personally viewed and interpreted this ECG.   Date: 10/31/2022  EKG Time: 1715  Rate: 70  Rhythm: sinus  Axis: nl  Intervals:first degree av block  ST&T Change: No acute ischemic changes    RADIOLOGY I independently reviewed and interpreted chest x-ray and see pulmonary edema   PROCEDURES:  Critical Care performed: No  Procedures   MEDICATIONS ORDERED IN ED: Medications  furosemide (LASIX) injection 80 mg (80 mg Intravenous Given 10/31/22 1948)    IMPRESSION / MDM / ASSESSMENT AND PLAN / ED COURSE  I reviewed the triage vital signs and the nursing notes.                                Patient's presentation is most consistent with acute presentation with potential threat to life or bodily function.  Differential diagnosis includes, but is not limited to, acute decompensated CHF, peripheral edema, scrotal edema, considered but less likely DVT or PE,  ACS   The patient is on the cardiac monitor to evaluate for evidence of arrhythmia and/or significant heart rate changes.  MDM: Gradually worsening peripheral edema scrotal edema in the setting of CHF exacerbation, has been increasing Lasix without good effect.  Plus weight gain, but denies chest pain or shortness of breath despite evidence of pulmonary edema on chest x-ray, and he has no respiratory distress no hypoxemia.  I will give him an IV dose of Lasix 80 mg and assess for effect, disposition pending.  Given no chest pain or shortness of breath I doubt he is in respiratory failure, ACS, worsening of his PE given full compliance with his Eliquis.  Scrotal edema is most likely due to fluid overload rather than infection.  --   I considered hospitalization for admission or observation and offered admission for further diuresis but since the patient states that he put out a significant amount of fluids with diuresis and feels better, he and his son have decided to return home tonight and follow-up with PMD for adjustments of diuretics in the morning.  They understand to return to the emergency department with any new or worsening symptoms        FINAL CLINICAL IMPRESSION(S) / ED DIAGNOSES   Final diagnoses:  Peripheral edema  Hypervolemia, unspecified hypervolemia type     Rx / DC Orders   ED Discharge Orders     None        Note:  This document was prepared using Dragon voice recognition software and may include unintentional dictation errors.    Pilar Jarvis, MD 10/31/22 [phone]

## 2022-10-31 NOTE — Discharge Instructions (Signed)
Call your doctor in the morning to discuss your diuretics.  Ask them if there is room to increase the dose of your diuretic, increase the frequency of your diuretic, or switch to an alternative diuretic agent to help reduce the swelling in your legs.  When you are able, elevate your legs to reduce swelling.  Use compression stockings.  Thank you for choosing [place] for your health care today!  Please see your primary doctor this week for a follow up appointment.   Sometimes, in the early stages of certain disease courses it is difficult to detect in the emergency department evaluation -- so, it is important that you continue to monitor your symptoms and call your doctor right away or return to the emergency department if you develop any new or worsening symptoms.  Please go to the following website to schedule new (and existing) patient appointments:   [email or web address]  If you do not have a primary doctor try calling the following clinics to establish care:  If you have insurance:  [facility] [phone] [address] [place] [place] [address]   [facility]  [phone] [address] [place] [place] [address]   If you do not have insurance:  [facility]  [phone] [address], [place] [address]   The following is another list of primary care offices in the area who are accepting new patients at this time.  Please reach out to one of them directly and let them know you would like to schedule an appointment to follow up on an Emergency Department visit, and/or to establish a new primary care provider (PCP).  There are likely other primary care clinics in the are who are accepting new patients, but this is an excellent place to start:  [facility] Lead physician: Dr Shirlee Latch [address] [place], [place] [address] [phone]  [facility] Lead Physician:  Dr Alba Cory [address], [place], [place] [address] [phone]  [facility]  Lead Physician: Dr Olevia Perches [address] [place], [place] [address] [phone]  [facility] Lead Physician: Dr Sofie Hartigan [address] [place], [place], [place] [address] [phone]  [facility] at [facility] Lead Physician: Dr Bari Edward [address] [place], [place] [address] [phone]   It was my pleasure to care for you today.   Daneil Dan Modesto Charon, MD

## 2022-10-31 NOTE — ED Triage Notes (Signed)
Patient in via POV w/ wife, complaints of increasing swelling to legs and testicles, states, "I can't even touch myself, it hurts so bad."  Wife states this has been an ongoing issue over the last 6 weeks with recent changes to diuretics without any relief.  Patient A/Ox4, NAD noted at this time.

## 2022-11-03 ENCOUNTER — Encounter: Payer: Medicare Other | Admitting: Family

## 2022-11-17 ENCOUNTER — Encounter: Payer: Self-pay | Admitting: Family

## 2022-11-17 ENCOUNTER — Ambulatory Visit: Payer: Medicare Other | Attending: Family | Admitting: Family

## 2022-11-17 VITALS — BP 102/61 | HR 65 | Wt 146.0 lb

## 2022-11-17 DIAGNOSIS — I4811 Longstanding persistent atrial fibrillation: Secondary | ICD-10-CM | POA: Diagnosis not present

## 2022-11-17 DIAGNOSIS — I509 Heart failure, unspecified: Secondary | ICD-10-CM | POA: Diagnosis not present

## 2022-11-17 DIAGNOSIS — I4891 Unspecified atrial fibrillation: Secondary | ICD-10-CM | POA: Insufficient documentation

## 2022-11-17 DIAGNOSIS — Z79899 Other long term (current) drug therapy: Secondary | ICD-10-CM | POA: Diagnosis not present

## 2022-11-17 DIAGNOSIS — Z7901 Long term (current) use of anticoagulants: Secondary | ICD-10-CM | POA: Diagnosis not present

## 2022-11-17 DIAGNOSIS — N189 Chronic kidney disease, unspecified: Secondary | ICD-10-CM | POA: Diagnosis not present

## 2022-11-17 DIAGNOSIS — R0602 Shortness of breath: Secondary | ICD-10-CM | POA: Diagnosis present

## 2022-11-17 DIAGNOSIS — Z7984 Long term (current) use of oral hypoglycemic drugs: Secondary | ICD-10-CM | POA: Diagnosis not present

## 2022-11-17 DIAGNOSIS — I10 Essential (primary) hypertension: Secondary | ICD-10-CM

## 2022-11-17 DIAGNOSIS — I428 Other cardiomyopathies: Secondary | ICD-10-CM | POA: Insufficient documentation

## 2022-11-17 DIAGNOSIS — R42 Dizziness and giddiness: Secondary | ICD-10-CM | POA: Diagnosis not present

## 2022-11-17 DIAGNOSIS — I5032 Chronic diastolic (congestive) heart failure: Secondary | ICD-10-CM

## 2022-11-17 DIAGNOSIS — J90 Pleural effusion, not elsewhere classified: Secondary | ICD-10-CM | POA: Insufficient documentation

## 2022-11-17 DIAGNOSIS — E785 Hyperlipidemia, unspecified: Secondary | ICD-10-CM | POA: Insufficient documentation

## 2022-11-17 DIAGNOSIS — I130 Hypertensive heart and chronic kidney disease with heart failure and stage 1 through stage 4 chronic kidney disease, or unspecified chronic kidney disease: Secondary | ICD-10-CM | POA: Diagnosis not present

## 2022-11-17 DIAGNOSIS — R531 Weakness: Secondary | ICD-10-CM | POA: Insufficient documentation

## 2022-11-17 DIAGNOSIS — R059 Cough, unspecified: Secondary | ICD-10-CM | POA: Diagnosis not present

## 2022-11-17 MED ORDER — EMPAGLIFLOZIN 10 MG PO TABS: 10.0000 mg | ORAL_TABLET | Freq: Every day | ORAL | 5 refills | Status: DC

## 2022-11-17 MED ORDER — TORSEMIDE 20 MG PO TABS: ORAL_TABLET | ORAL | 3 refills | Status: DC

## 2022-11-17 NOTE — Progress Notes (Signed)
ReDS Vest / Clip - 11/17/22 1035       ReDS Vest / Clip   Station Marker C    Ruler Value 27    ReDS Value Range Moderate volume overload    ReDS Actual Value 39

## 2022-11-17 NOTE — Patient Instructions (Signed)
Stop taking furosemide (lasix) and begin torsemide. You will take 2 tablets every morning and 1 tablet every evening.

## 2022-11-17 NOTE — Progress Notes (Signed)
Patient ID: Marcus Kemp, male    DOB: 01-14-1925, 89 y.o.   MRN: [id number]  Primary cardiologist: Leanora Ivanoff, PA (last seen 12/23; returns 11/22/22) PCP: Marguarite Arbour, MD (last seen 03/24)  HPI  Marcus Kemp is a 89 y/o male with a history of AAA, hyperlipidemia, HTN, CKD, atrial fibrillation, anemia, GERD,   Echo 09/10/22: EF of 55-60% along with moderate LVH along with severely elevated PA pressure of 70.4 mmHg, moderate LAE and moderate/ severe Marcus/TR.   Was in the ED 10/31/22 due to pedal edema. IV lasix given. Admitted 09/09/22 due to pedal edema due to HF exacerbation. Diuresed. D-dimer elevated and CTA + for PE with right heart strain RV:LV 1.8, cardiac enlargement with bilateral pleural effusions and probable interstitial edema. Heparin gtt and then placed on eliquis. Midodrine started.   He presents today for a HF f/u visit with a chief complaint of moderate SOB with little exertion. Chronic in nature. Has fatigue, cough, pedal edema, light-headedness, weakness and slight weight gain along with this. Denies difficulty sleeping, abdominal distention, palpitations or chest pain.   Says that he received IV lasix a couple of weeks ago but doesn't feel like it helped much. Has been taking furosemide for "awhile".   Past Medical History:  Diagnosis Date   A-fib    a.) CHA2DS2-VASc = 4 (age x 2, CHF, aortic plaque). b.) s/p unsuccessful TEE cardioversion 04/28/2016 --> rec'd 200 J x1 and 360 J x 2. c.) rate/rhythm maintained on oral metoprolol succinante; chronically anticoagulated using dose reduced apixaban.   Anemia    Aortic atherosclerosis    Arthritis    CHF (congestive heart failure)    a.) TTE 04/26/2016: EF >55%; mod LVH; MVP; mild-mod LAE, mild Marcus. b.) TTE 05/14/209: EF >55%; mod RVE; mild BAE; mod Marcus.TR; triv AR/PR. c.) TTE 03/28/2019: EF >55%; mild LVH; mod BAE; mild PR, mod Marcus/TR.   GERD (gastroesophageal reflux disease)    Heart murmur    History of kidney stones     HOH (hard of hearing)    a.) hearing aid to LEFT ear.   Hyperlipidemia    Hypertension    Infrarenal abdominal aortic aneurysm (AAA) without rupture 05/05/2021   a.) CT 05/05/2021 --> measured 3.0 cm.   MVP (mitral valve prolapse)    Peripheral edema    Pneumonia    Pulmonary embolus    Pulmonary HTN 04/26/2016   a.) TTE 04/26/2016: EF >55%; PASP 45. b.) TTE 12/06/2017: EF >55%; RVSP 76.6 mmHg. c.) TTE 03/28/2019: EF >55%; RVSP 77.8 mmHg.   Renal cell carcinoma    Rheumatic fever    Sepsis    Valvular heart disease    Past Surgical History:  Procedure Laterality Date   INGUINAL HERNIA REPAIR Left 01/13/2022   Procedure: HERNIA REPAIR INGUINAL ADULT;  Surgeon: Earline Mayotte, MD;  Location: [facility] ORS;  Service: General;  Laterality: Left;   INSERTION OF MESH  01/13/2022   Procedure: INSERTION OF MESH;  Surgeon: Earline Mayotte, MD;  Location: [facility] ORS;  Service: General;;   LITHOTRIPSY     TEE WITH CARDIOVERSION N/A    P   TONSILLECTOMY     Family History  Problem Relation Age of Onset   Heart disease Sister    Hypertension Other    Social History   Tobacco Use   Smoking status: Former    Packs/day: 0.25    Years: 8.00    Additional pack years: 0.00  Total pack years: 2.00    Types: Cigarettes    Quit date: 110    Years since quitting: 84.3   Smokeless tobacco: Never  Substance Use Topics   Alcohol use: No   Allergies  Allergen Reactions   Penicillins Anaphylaxis   Prior to Admission medications   Medication Sig Start Date End Date Taking? Authorizing Provider  APIXABAN (ELIQUIS) VTE STARTER PACK (  AND ) Take as directed on package: start with two-5mg  tablets twice daily for 7 days. On day 8, switch to one-5mg  tablet twice daily. 09/14/22  Yes Gillis Santa, MD  Azelastine HCl 137 MCG/SPRAY SOLN SMARTSIG:2 Puff(s) Both Nares Twice Daily 12/08/21  Yes [provider]  cetirizine (ZYRTEC) 10 MG tablet SMARTSIG:1 pill By Mouth Daily PRN  12/13/21  Yes [provider]  empagliflozin (JARDIANCE) 10 MG TABS tablet Take 10 mg by mouth daily.   Yes [provider]  furosemide (LASIX) 40 MG tablet Take 40 mg by mouth in the morning.   Yes [provider]  metoprolol succinate (TOPROL-XL) 25 MG 24 hr tablet Take 1 tablet (25 mg total) by mouth every morning. 10/27/22  Yes Marguerette Sheller, Inetta Fermo A, FNP  midodrine (PROAMATINE) 5 MG tablet Take 5 mg by mouth as needed. For SBP >100   Yes [provider]  rOPINIRole (REQUIP) 1 MG tablet Take 1 tablet by mouth at bedtime. 10/13/22 10/13/23 Yes [provider]  traZODone (DESYREL) 50 MG tablet Take 50 mg by mouth at bedtime.   Yes [provider]  vitamin B-12 (CYANOCOBALAMIN) 500 MCG tablet Take 1 tablet (500 mcg total) by mouth daily. 12/29/21  Yes Rickard Patience, MD    Review of Systems  Constitutional:  Positive for fatigue. Negative for appetite change (improving).  HENT:  Positive for hearing loss. Negative for congestion and sore throat.   Eyes: Negative.   Respiratory:  Positive for cough (dry; improving) and shortness of breath. Negative for chest tightness.   Cardiovascular:  Positive for leg swelling. Negative for chest pain and palpitations.  Gastrointestinal:  Negative for abdominal distention and abdominal pain.  Endocrine: Negative.   Genitourinary: Negative.   Musculoskeletal:  Negative for back pain and neck pain.  Skin: Negative.   Allergic/Immunologic: Negative.   Neurological:  Positive for weakness and light-headedness (at times). Negative for dizziness.  Hematological:  Negative for adenopathy. Does not bruise/bleed easily.  Psychiatric/Behavioral:  Negative for dysphoric mood and sleep disturbance. The patient is not nervous/anxious.    Vitals:   11/17/22 1035  BP: 102/61  Pulse: 65  SpO2: 99%  Weight: 146 lb (66.2 kg)   Wt Readings from Last 3 Encounters:  11/17/22 146 lb (66.2 kg)  10/31/22 142 lb (64.4 kg)  10/27/22 144  lb 6 oz (65.5 kg)   Lab Results  Component Value Date   CREATININE 1.40 (H) 10/31/2022   CREATININE 1.38 (H) 10/27/2022   CREATININE 1.38 (H) 10/21/2022   Physical Exam Vitals and nursing note reviewed. Exam conducted with a chaperone present (wife).  Constitutional:      Appearance: Normal appearance.  HENT:     Head: Normocephalic and atraumatic.     Right Ear: Decreased hearing noted.     Left Ear: Decreased hearing noted.  Cardiovascular:     Rate and Rhythm: Normal rate. Rhythm irregular.  Pulmonary:     Effort: Pulmonary effort is normal. No respiratory distress.     Breath sounds: No wheezing or rales.  Abdominal:     General:  There is no distension.     Palpations: Abdomen is soft.     Tenderness: There is no abdominal tenderness.  Musculoskeletal:        General: No tenderness.     Cervical back: Normal range of motion and neck supple.     Right lower leg: Edema (2+ pitting to knee) present.     Left lower leg: Edema (2+ pitting to knee) present.  Skin:    General: Skin is warm and dry.  Neurological:     General: No focal deficit present.     Mental Status: He is alert and oriented to person, place, and time.  Psychiatric:        Mood and Affect: Mood normal.        Behavior: Behavior normal.        Thought Content: Thought content normal.   Assessment & Plan:  1: NICM with preserved ejection fraction- - NYHA class III - fluid overloaded with continued pedal edema, elevated ReDs reding - weighing daily; reminded to call for an overnight weight gain of > 2 pounds or a weekly weight gain of > 5 pounds - weight up 2 pounds from last visit here 3 weeks ago - ReDs reading today is 39% - echo 09/10/22: EF of 55-60% along with moderate LV along with severely elevated PA pressure of 70.4 mmHg, moderate LAE and moderate/ severe Marcus/TR.  - not adding salt to his food - continue metoprolol succinate  daily - continue jardiance  daily - stop furosemide and  begin torsemide  AM/  PM - BMP next visit if not done in the interim - BNP 10/31/22 was 1945.2  2: HTN- - BP 102/61 - midodrine  TID PRN for SBP <100; hasn't had to take this in several days - saw PCP ([place]) 03/24; returns in 5 days - BMP 10/31/22 showed sodium 136, potassium 4.1, creatinine 1.4 & GFR 46  3: Atrial fibrillation-  - saw cardiology Mellissa Kohut) 12/23; returns in 5 days - continue eliquis  BID - ablation done 04/2016  Return in 1 month, sooner if needed.

## 2022-11-18 ENCOUNTER — Encounter: Payer: Self-pay | Admitting: Anesthesiology

## 2022-11-22 ENCOUNTER — Other Ambulatory Visit
Admission: RE | Admit: 2022-11-22 | Discharge: 2022-11-22 | Disposition: A | Discharge status: Home / Self Care | Payer: Medicare Other | Source: Ambulatory Visit | Attending: Physician Assistant | Admitting: Physician Assistant

## 2022-11-22 DIAGNOSIS — I5032 Chronic diastolic (congestive) heart failure: Secondary | ICD-10-CM | POA: Diagnosis present

## 2022-11-22 LAB — BRAIN NATRIURETIC PEPTIDE: B Natriuretic Peptide: 1545.3 pg/mL — ABNORMAL HIGH (ref 0.0–100.0)

## 2022-11-25 ENCOUNTER — Encounter: Payer: Self-pay | Admitting: Ophthalmology

## 2022-11-25 NOTE — Anesthesia Preprocedure Evaluation (Deleted)
Anesthesia Evaluation    Airway        Dental   Pulmonary former smoker          Cardiovascular hypertension,      Neuro/Psych    GI/Hepatic   Endo/Other    Renal/GU      Musculoskeletal   Abdominal   Peds  Hematology   Anesthesia Other Findings Hyperlipidemia  CHF (congestive heart failure) (HCC) A-fib (HCC)  Heart murmur Pneumonia  Sepsis (HCC) Anemia  MVP (mitral valve prolapse) History of kidney stones  Rheumatic fever Peripheral edema Renal cell carcinoma (HCC) GERD (gastroesophageal reflux disease) Arthritis HOH (hard of hearing)  Aortic atherosclerosis (HCC) Infrarenal abdominal aortic aneurysm (AAA) without rupture (HCC)  Pulmonary HTN (HCC) Valvular heart disease Hypertension Pulmonary embolus [facility])     Reproductive/Obstetrics                              Anesthesia Physical Anesthesia Plan Anesthesia Quick Evaluation

## 2022-11-29 ENCOUNTER — Encounter: Payer: Self-pay | Admitting: Family

## 2022-11-30 ENCOUNTER — Other Ambulatory Visit: Payer: Self-pay

## 2022-11-30 ENCOUNTER — Inpatient Hospital Stay: Payer: Medicare Other

## 2022-11-30 ENCOUNTER — Encounter: Payer: Self-pay | Admitting: Emergency Medicine

## 2022-11-30 ENCOUNTER — Emergency Department: Payer: Medicare Other

## 2022-11-30 ENCOUNTER — Inpatient Hospital Stay
Admission: EM | Admit: 2022-11-30 | Discharge: 2022-12-07 | DRG: 291 | Disposition: A | Discharge status: Home Health | Payer: Medicare Other | Attending: Internal Medicine | Admitting: Internal Medicine

## 2022-11-30 DIAGNOSIS — N1831 Chronic kidney disease, stage 3a: Secondary | ICD-10-CM | POA: Diagnosis present

## 2022-11-30 DIAGNOSIS — I051 Rheumatic mitral insufficiency: Secondary | ICD-10-CM | POA: Diagnosis present

## 2022-11-30 DIAGNOSIS — I714 Abdominal aortic aneurysm, without rupture, unspecified: Secondary | ICD-10-CM | POA: Diagnosis present

## 2022-11-30 DIAGNOSIS — Z66 Do not resuscitate: Secondary | ICD-10-CM | POA: Diagnosis present

## 2022-11-30 DIAGNOSIS — I5033 Acute on chronic diastolic (congestive) heart failure: Secondary | ICD-10-CM | POA: Diagnosis present

## 2022-11-30 DIAGNOSIS — Z87891 Personal history of nicotine dependence: Secondary | ICD-10-CM | POA: Diagnosis not present

## 2022-11-30 DIAGNOSIS — Z8249 Family history of ischemic heart disease and other diseases of the circulatory system: Secondary | ICD-10-CM | POA: Diagnosis not present

## 2022-11-30 DIAGNOSIS — D539 Nutritional anemia, unspecified: Secondary | ICD-10-CM | POA: Diagnosis present

## 2022-11-30 DIAGNOSIS — I130 Hypertensive heart and chronic kidney disease with heart failure and stage 1 through stage 4 chronic kidney disease, or unspecified chronic kidney disease: Secondary | ICD-10-CM | POA: Diagnosis present

## 2022-11-30 DIAGNOSIS — H9190 Unspecified hearing loss, unspecified ear: Secondary | ICD-10-CM | POA: Diagnosis present

## 2022-11-30 DIAGNOSIS — I4811 Longstanding persistent atrial fibrillation: Secondary | ICD-10-CM | POA: Diagnosis present

## 2022-11-30 DIAGNOSIS — R319 Hematuria, unspecified: Secondary | ICD-10-CM | POA: Diagnosis not present

## 2022-11-30 DIAGNOSIS — I9589 Other hypotension: Secondary | ICD-10-CM | POA: Diagnosis present

## 2022-11-30 DIAGNOSIS — D696 Thrombocytopenia, unspecified: Secondary | ICD-10-CM | POA: Insufficient documentation

## 2022-11-30 DIAGNOSIS — I959 Hypotension, unspecified: Secondary | ICD-10-CM | POA: Diagnosis not present

## 2022-11-30 DIAGNOSIS — J9601 Acute respiratory failure with hypoxia: Secondary | ICD-10-CM | POA: Diagnosis present

## 2022-11-30 DIAGNOSIS — Z7901 Long term (current) use of anticoagulants: Secondary | ICD-10-CM | POA: Diagnosis not present

## 2022-11-30 DIAGNOSIS — N179 Acute kidney failure, unspecified: Secondary | ICD-10-CM

## 2022-11-30 DIAGNOSIS — E785 Hyperlipidemia, unspecified: Secondary | ICD-10-CM | POA: Diagnosis present

## 2022-11-30 DIAGNOSIS — K219 Gastro-esophageal reflux disease without esophagitis: Secondary | ICD-10-CM | POA: Diagnosis present

## 2022-11-30 DIAGNOSIS — I493 Ventricular premature depolarization: Secondary | ICD-10-CM | POA: Diagnosis present

## 2022-11-30 DIAGNOSIS — Z86711 Personal history of pulmonary embolism: Secondary | ICD-10-CM

## 2022-11-30 DIAGNOSIS — Z7984 Long term (current) use of oral hypoglycemic drugs: Secondary | ICD-10-CM | POA: Diagnosis not present

## 2022-11-30 DIAGNOSIS — Z85528 Personal history of other malignant neoplasm of kidney: Secondary | ICD-10-CM | POA: Diagnosis not present

## 2022-11-30 DIAGNOSIS — I058 Other rheumatic mitral valve diseases: Secondary | ICD-10-CM | POA: Diagnosis present

## 2022-11-30 DIAGNOSIS — I700 Atherosclerosis of aorta: Secondary | ICD-10-CM | POA: Diagnosis present

## 2022-11-30 DIAGNOSIS — Z7189 Other specified counseling: Secondary | ICD-10-CM | POA: Diagnosis not present

## 2022-11-30 DIAGNOSIS — I272 Pulmonary hypertension, unspecified: Secondary | ICD-10-CM | POA: Diagnosis present

## 2022-11-30 DIAGNOSIS — Z880 Allergy status to penicillin: Secondary | ICD-10-CM

## 2022-11-30 DIAGNOSIS — T502X5A Adverse effect of carbonic-anhydrase inhibitors, benzothiadiazides and other diuretics, initial encounter: Secondary | ICD-10-CM | POA: Diagnosis present

## 2022-11-30 DIAGNOSIS — R54 Age-related physical debility: Secondary | ICD-10-CM | POA: Diagnosis present

## 2022-11-30 DIAGNOSIS — I2489 Other forms of acute ischemic heart disease: Secondary | ICD-10-CM | POA: Diagnosis present

## 2022-11-30 DIAGNOSIS — Z79899 Other long term (current) drug therapy: Secondary | ICD-10-CM | POA: Diagnosis not present

## 2022-11-30 DIAGNOSIS — I2699 Other pulmonary embolism without acute cor pulmonale: Secondary | ICD-10-CM | POA: Diagnosis present

## 2022-11-30 DIAGNOSIS — D509 Iron deficiency anemia, unspecified: Secondary | ICD-10-CM | POA: Diagnosis present

## 2022-11-30 DIAGNOSIS — E876 Hypokalemia: Secondary | ICD-10-CM | POA: Diagnosis present

## 2022-11-30 DIAGNOSIS — R3129 Other microscopic hematuria: Secondary | ICD-10-CM | POA: Diagnosis present

## 2022-11-30 DIAGNOSIS — Z515 Encounter for palliative care: Secondary | ICD-10-CM | POA: Diagnosis not present

## 2022-11-30 DIAGNOSIS — Z882 Allergy status to sulfonamides status: Secondary | ICD-10-CM

## 2022-11-30 LAB — URINALYSIS, ROUTINE W REFLEX MICROSCOPIC
Bacteria, UA: NONE SEEN
Bilirubin Urine: NEGATIVE
Glucose, UA: 150 mg/dL — AB
Ketones, ur: NEGATIVE mg/dL
Leukocytes,Ua: NEGATIVE
Nitrite: NEGATIVE
Protein, ur: 30 mg/dL — AB
RBC / HPF: >50 RBC/hpf (ref 0–5)
Specific Gravity, Urine: 1.01 (ref 1.005–1.030)
Squamous Epithelial / HPF: NONE SEEN /HPF (ref 0–5)
pH: 6 (ref 5.0–8.0)

## 2022-11-30 LAB — BASIC METABOLIC PANEL
Anion gap: 12 (ref 5–15)
BUN: 47 mg/dL — ABNORMAL HIGH (ref 8–23)
CO2: 30 mmol/L (ref 22–32)
Calcium: 8.9 mg/dL (ref 8.9–10.3)
Chloride: 96 mmol/L — ABNORMAL LOW (ref 98–111)
Creatinine, Ser: 1.74 mg/dL — ABNORMAL HIGH (ref 0.61–1.24)
GFR, Estimated: 36 mL/min — ABNORMAL LOW (ref >60)
Glucose, Bld: 104 mg/dL — ABNORMAL HIGH (ref 70–99)
Potassium: 3.4 mmol/L — ABNORMAL LOW (ref 3.5–5.1)
Sodium: 138 mmol/L (ref 135–145)

## 2022-11-30 LAB — CBC
HCT: 37 % — ABNORMAL LOW (ref 39.0–52.0)
Hemoglobin: 12 g/dL — ABNORMAL LOW (ref 13.0–17.0)
MCH: 34 pg (ref 26.0–34.0)
MCHC: 32.4 g/dL (ref 30.0–36.0)
MCV: 104.8 fL — ABNORMAL HIGH (ref 80.0–100.0)
Platelets: 105 10*3/uL — ABNORMAL LOW (ref 150–400)
RBC: 3.53 MIL/uL — ABNORMAL LOW (ref 4.22–5.81)
RDW: 18.9 % — ABNORMAL HIGH (ref 11.5–15.5)
WBC: 5 10*3/uL (ref 4.0–10.5)
nRBC: 0.6 % — ABNORMAL HIGH (ref 0.0–0.2)

## 2022-11-30 LAB — TROPONIN I (HIGH SENSITIVITY): Troponin I (High Sensitivity): 59 ng/L — ABNORMAL HIGH

## 2022-11-30 LAB — BRAIN NATRIURETIC PEPTIDE: B Natriuretic Peptide: 1813.4 pg/mL — ABNORMAL HIGH (ref 0.0–100.0)

## 2022-11-30 MED ORDER — SODIUM CHLORIDE 0.9 % IV SOLN: 250.0000 mL | INTRAVENOUS | Status: DC | PRN

## 2022-11-30 MED ORDER — MIDODRINE HCL 5 MG PO TABS
5.0000 mg | ORAL_TABLET | Freq: Three times a day (TID) | ORAL | Status: DC
  Administered 2022-11-30 – 2022-12-07 (×21): 5 mg via ORAL
  Filled 2022-11-30 (×20): qty 1

## 2022-11-30 MED ORDER — TRAZODONE HCL 50 MG PO TABS
50.0000 mg | ORAL_TABLET | Freq: Every day | ORAL | Status: DC
  Administered 2022-11-30 – 2022-12-06 (×7): 50 mg via ORAL
  Filled 2022-11-30 (×7): qty 1

## 2022-11-30 MED ORDER — FUROSEMIDE 10 MG/ML IJ SOLN
40.0000 mg | Freq: Two times a day (BID) | INTRAMUSCULAR | Status: DC
  Administered 2022-11-30 – 2022-12-01 (×2): 40 mg via INTRAVENOUS
  Filled 2022-11-30 (×2): qty 4

## 2022-11-30 MED ORDER — SODIUM CHLORIDE 0.9% FLUSH
3.0000 mL | INTRAVENOUS | Status: DC | PRN
  Administered 2022-12-01: 3 mL via INTRAVENOUS

## 2022-11-30 MED ORDER — ROPINIROLE HCL 1 MG PO TABS
1.0000 mg | ORAL_TABLET | Freq: Every day | ORAL | Status: DC
  Administered 2022-11-30 – 2022-12-06 (×7): 1 mg via ORAL
  Filled 2022-11-30 (×7): qty 1

## 2022-11-30 MED ORDER — MIDODRINE HCL 5 MG PO TABS
5.0000 mg | ORAL_TABLET | Freq: Once | ORAL | Status: AC
  Administered 2022-11-30: 5 mg via ORAL
  Filled 2022-11-30: qty 1

## 2022-11-30 MED ORDER — ONDANSETRON HCL 4 MG/2ML IJ SOLN: 4.0000 mg | Freq: Four times a day (QID) | INTRAMUSCULAR | Status: DC | PRN

## 2022-11-30 MED ORDER — FUROSEMIDE 10 MG/ML IJ SOLN
40.0000 mg | Freq: Once | INTRAMUSCULAR | Status: AC
  Administered 2022-11-30: 40 mg via INTRAVENOUS
  Filled 2022-11-30: qty 4

## 2022-11-30 MED ORDER — SODIUM CHLORIDE 0.9% FLUSH
3.0000 mL | Freq: Two times a day (BID) | INTRAVENOUS | Status: DC
  Administered 2022-11-30 – 2022-12-02 (×4): 3 mL via INTRAVENOUS

## 2022-11-30 MED ORDER — ACETAMINOPHEN 325 MG PO TABS
650.0000 mg | ORAL_TABLET | ORAL | Status: DC | PRN
  Administered 2022-12-03 – 2022-12-07 (×5): 650 mg via ORAL
  Filled 2022-11-30 (×5): qty 2

## 2022-11-30 MED ORDER — APIXABAN 5 MG PO TABS
5.0000 mg | ORAL_TABLET | Freq: Two times a day (BID) | ORAL | Status: DC
  Administered 2022-11-30 – 2022-12-07 (×14): 5 mg via ORAL
  Filled 2022-11-30 (×14): qty 1

## 2022-11-30 MED ORDER — METOPROLOL SUCCINATE ER 50 MG PO TB24
25.0000 mg | ORAL_TABLET | ORAL | Status: DC
  Administered 2022-12-01: 25 mg via ORAL
  Filled 2022-11-30: qty 1

## 2022-11-30 NOTE — Assessment & Plan Note (Signed)
Hematuria noted on urinalysis today that was not present in March 2024.  Will need outpatient follow-up with urology if persistent.  Patient denies any urinary symptoms at this time

## 2022-11-30 NOTE — Assessment & Plan Note (Signed)
Hemoglobin is stable at this time.  Will monitor while admitted.

## 2022-11-30 NOTE — H&P (Signed)
History and Physical    Patient: Marcus Kemp QMV:[id number] DOB: 12-Jun-1925 DOA: 11/30/2022 DOS: the patient was seen and examined on 11/30/2022 PCP: Marguarite Arbour, MD  Patient coming from: Home  Chief Complaint:  Chief Complaint  Patient presents with   Leg Swelling   HPI: Marcus Kemp is a 89 y.o. male with medical history significant of HFpEF with last EF over 55%, recent PE on Eliquis, pulmonary hypertension, hypertension, persistent atrial fibrillation, renal cell carcinoma, rheumatic fever complicated by valvular disease, who presents to the ED due to leg swelling  History is limited as patient is a poor historian.  He notes that he has been experiencing increasing lower extremity swelling but is unclear for how long it has been going on.  He denies any chest pain or shortness of breath at this time.  He notes that he is been having trouble walking due to the heaviness of his legs.  Per chart review, family previously at bedside stated swelling in legs has been occurring for the last 48 hours despite diuresis with home torsemide.  In addition, his weight increased from 135 to 142 in this time.  Family noted that he was having increased generalized weakness and was having dyspnea on exertion.  They had noted a nonproductive cough but no shortness of breath at rest, fevers, chills, chest pain or palpitations.  ED course: On arrival to the ED, patient's blood pressure was on the lower end of normal at 90/57 with heart rate of 63.  He was saturating at 99% on room air.  He was afebrile at 97.7.  Initial workup notable for hemoglobin of 12.0, platelets of 105, potassium 3.4, bicarb 30, creatinine 1.74 with GFR of 36, troponin of 59, and BNP 1813.  Urinalysis with hematuria with over 50 RBC/hpf and WBCs. Chest x-ray was obtained with minimal bibasilar atelectasis versus edema with minimal bilateral pleural effusions.  Patient started on Lasix and midodrine.  TRH contacted for  admission.  Review of Systems: As mentioned in the history of present illness. All other systems reviewed and are negative.  Past Medical History:  Diagnosis Date   A-fib [facility])    a.) CHA2DS2-VASc = 4 (age x 2, CHF, aortic plaque). b.) s/p unsuccessful TEE cardioversion 04/28/2016 --> rec'd 200 J x1 and 360 J x 2. c.) rate/rhythm maintained on oral metoprolol succinante; chronically anticoagulated using dose reduced apixaban.   Anemia    Aortic atherosclerosis (HCC)    Arthritis    CHF (congestive heart failure) (HCC)    a.) TTE 04/26/2016: EF >55%; mod LVH; MVP; mild-mod LAE, mild MR. b.) TTE 05/14/209: EF >55%; mod RVE; mild BAE; mod MR.TR; triv AR/PR. c.) TTE 03/28/2019: EF >55%; mild LVH; mod BAE; mild PR, mod MR/TR.   GERD (gastroesophageal reflux disease)    Heart murmur    History of kidney stones    HOH (hard of hearing)    a.) hearing aid to LEFT ear.   Hyperlipidemia    Hypertension    Infrarenal abdominal aortic aneurysm (AAA) without rupture (HCC) 05/05/2021   a.) CT 05/05/2021 --> measured 3.0 cm.   MVP (mitral valve prolapse)    Peripheral edema    Pneumonia    Pulmonary embolus (HCC)    Pulmonary HTN (HCC) 04/26/2016   a.) TTE 04/26/2016: EF >55%; PASP 45. b.) TTE 12/06/2017: EF >55%; RVSP 76.6 mmHg. c.) TTE 03/28/2019: EF >55%; RVSP 77.8 mmHg.   Renal cell carcinoma (HCC)    Rheumatic fever  Sepsis (HCC)    Valvular heart disease    Past Surgical History:  Procedure Laterality Date   INGUINAL HERNIA REPAIR Left 01/13/2022   Procedure: HERNIA REPAIR INGUINAL ADULT;  Surgeon: Earline Mayotte, MD;  Location: [facility] ORS;  Service: General;  Laterality: Left;   INSERTION OF MESH  01/13/2022   Procedure: INSERTION OF MESH;  Surgeon: Earline Mayotte, MD;  Location: [facility] ORS;  Service: General;;   LITHOTRIPSY     TEE WITH CARDIOVERSION N/A    P   TONSILLECTOMY     Social History:  reports that he quit smoking about 84 years ago. His smoking use included  cigarettes. He has a 2.00 pack-year smoking history. He has never used smokeless tobacco. He reports that he does not drink alcohol and does not use drugs.  Allergies  Allergen Reactions   Penicillins Anaphylaxis   Sulfa Antibiotics Nausea Only    Family History  Problem Relation Age of Onset   Heart disease Sister    Hypertension Other     Prior to Admission medications   Medication Sig Start Date End Date Taking? Authorizing Provider  APIXABAN Everlene Balls) VTE STARTER PACK (10MG  AND 5MG ) Take as directed on package: start with two-5mg  tablets twice daily for 7 days. On day 8, switch to one-5mg  tablet twice daily. 09/14/22   Gillis Santa, MD  Azelastine HCl 137 MCG/SPRAY SOLN SMARTSIG:2 Puff(s) Both Nares Twice Daily 12/08/21   [provider]  cetirizine (ZYRTEC) 10 MG tablet SMARTSIG:1 pill By Mouth Daily PRN 12/13/21   [provider]  empagliflozin (JARDIANCE) 10 MG TABS tablet Take 1 tablet (10 mg total) by mouth daily. 11/17/22   Delma Freeze, FNP  metoprolol succinate (TOPROL-XL) 25 MG 24 hr tablet Take 1 tablet (25 mg total) by mouth every morning. 10/27/22   Delma Freeze, FNP  midodrine (PROAMATINE) 5 MG tablet Take 5 mg by mouth as needed. For SBP >100    [provider]  rOPINIRole (REQUIP) 1 MG tablet Take 1 tablet by mouth at bedtime. 10/13/22 10/13/23  [provider]  torsemide (DEMADEX) 20 MG tablet 40mg  in the morning and 20mg  in the evening 11/17/22   Delma Freeze, FNP  traZODone (DESYREL) 50 MG tablet Take 50 mg by mouth at bedtime.    [provider]  vitamin B-12 (CYANOCOBALAMIN) 500 MCG tablet Take 1 tablet (500 mcg total) by mouth daily. 12/29/21   Rickard Patience, MD    Physical Exam: Vitals:   11/30/22 1050 11/30/22 1200  BP: (!) 90/57 92/61  Pulse: 63 64  Resp: 18 16  Temp: 97.7 F (36.5 C)   TempSrc: Oral   SpO2: 99% 100%   Physical Exam Vitals and nursing note reviewed.  Constitutional:      Appearance: He is  normal weight. He is ill-appearing (Chronically ill-appearing). He is not toxic-appearing.  HENT:     Head: Normocephalic and atraumatic.     Mouth/Throat:     Mouth: Mucous membranes are moist.     Pharynx: Oropharynx is clear.  Eyes:     Extraocular Movements: Extraocular movements intact.     Conjunctiva/sclera: Conjunctivae normal.  Cardiovascular:     Rate and Rhythm: Normal rate. Rhythm irregular.     Heart sounds: Murmur (Holosystolic murmur heard throughout, however loudest at the apex) heard.     No gallop.     Comments: 2-3+ pitting edema extending to the superior thighs Pulmonary:     Effort: Pulmonary effort  is normal.     Breath sounds: Decreased breath sounds (Diminished bibasilar breath sounds) present. No wheezing, rhonchi or rales.  Abdominal:     General: Bowel sounds are normal. There is no distension.     Palpations: Abdomen is soft.     Tenderness: There is no abdominal tenderness. There is no guarding.  Skin:    General: Skin is warm and dry.     Comments: Diffuse bruising throughout various stages of healing.  In addition, small circular rash that is noncontiguous throughout, but worse on bilateral lower extremities  Neurological:     Mental Status: He is alert.     Comments:  Patient is alert and oriented to person and somewhat situation, however due to hard of hearing and inattentiveness, unable to ascertain if oriented to place or time 3/5 strength of bilateral lower extremities 5/5 strength of bilateral upper extremities  Psychiatric:        Attention and Perception: He is inattentive.        Mood and Affect: Mood normal.        Behavior: Behavior normal.    Data Reviewed: CBC with WBC of 5.0, hemoglobin 12.0, MCV of 104 and platelets of 105 BMP with sodium of 138, potassium 3.4, bicarb 30, glucose 104, BUN 47, creatinine 1.74 and GFR 36 Urinalysis with glucosuria, hematuria, over 50 RBC and 11-20 WBC Initial troponin elevated at 59 BNP elevated  1813.  Previously 1545 on 11/22/2022  EKG personally reviewed.  Sinus rhythm with rate of 63.  First-degree AV block noted.  No ischemic appearing ST or T wave changes.  DG Chest 2 View  Result Date: 11/30/2022 CLINICAL DATA:  Shortness of breath. EXAM: CHEST - 2 VIEW COMPARISON:  October 31, 2022. FINDINGS: Stable cardiomegaly. Minimal bibasilar subsegmental atelectasis or edema is noted with minimal pleural effusions. Bony thorax is unremarkable. IMPRESSION: Minimal bibasilar subsegmental atelectasis or edema is noted with minimal bilateral pleural effusions. Aortic Atherosclerosis (ICD10-I70.0). Electronically Signed   By: Lupita Raider M.D.   On: 11/30/2022 11:46    Results are pending, will review when available.  Assessment and Plan:  * Acute on chronic heart failure with preserved ejection fraction (HFpEF) (HCC) Patient is presenting with gradually worsening lower extremity edema and weight gain that significantly worsened over the last 48 hours despite home diuresis with torsemide 40 mg twice daily.  Most recent echocardiogram in February 2024 demonstrating preserved EF at 55-60%, moderate LVH, mildly reduced RV systolic function and severely elevated PASP.  - Telemetry monitoring - Start Lasix 40 mg IV twice daily - Restart home midodrine 5 mg 3 times daily given soft blood pressures - Strict in and out - Daily weights - If diuresis is limited, may require cardiology consultation  AKI (acute kidney injury) (HCC) Per chart review, patient's creatinine has been gradually rising since March 2024 with uncertain etiology.  During this time, his torsemide was increased however he has been having increased edema as well.  Potentially congestive in nature but will also evaluate for retention.  - Bladder scans every shift - Renal ultrasound ordered - Daily BMP - Avoid nephrotoxic agents as able  Hypotension Chronic issue for which patient was previously on midodrine as needed.  Will  restart at this time in the setting of IV diuresis.  - Midodrine 5 mg 3 times daily with meals  Rheumatic mitral regurgitation Most recent echocardiogram in February demonstrated moderate to severe MR.  Unlikely patient is a surgical candidate.  Longstanding  persistent atrial fibrillation (HCC) Rate controlled at this time.  - Continue home metoprolol for rate control - Continue home Eliquis  Hematuria Hematuria noted on urinalysis today that was not present in March 2024.  Will need outpatient follow-up with urology if persistent.  Patient denies any urinary symptoms at this time  Thrombocytopenia (HCC) Mild thrombocytopenia dating back to October 2022.  Unclear etiology, however potentially just ITP.  No intervention indicated at this time.  Pulmonary embolism (HCC) - Continue home Eliquis  Macrocytic anemia Hemoglobin is stable at this time.  Will monitor while admitted.  Advance Care Planning:   Code Status: DNR/DNI.  Gold form signed and scanned in chart  Consults: None  Family Communication: No family at bedside, will update via telephone  Severity of Illness: The appropriate patient status for this patient is INPATIENT. Inpatient status is judged to be reasonable and necessary in order to provide the required intensity of service to ensure the patient's safety. The patient's presenting symptoms, physical exam findings, and initial radiographic and laboratory data in the context of their chronic comorbidities is felt to place them at high risk for further clinical deterioration. Furthermore, it is not anticipated that the patient will be medically stable for discharge from the hospital within 2 midnights of admission.   * I certify that at the point of admission it is my clinical judgment that the patient will require inpatient hospital care spanning beyond 2 midnights from the point of admission due to high intensity of service, high risk for further deterioration and high  frequency of surveillance required.*  Author: Verdene Lennert, MD 11/30/2022 2:33 PM  For on call review [email or web address].

## 2022-11-30 NOTE — Assessment & Plan Note (Signed)
Rate controlled at this time.  - Continue home metoprolol for rate control - Continue home Eliquis

## 2022-11-30 NOTE — Assessment & Plan Note (Signed)
Chronic issue for which patient was previously on midodrine as needed.  Will restart at this time in the setting of IV diuresis.  - Midodrine 5 mg 3 times daily with meals

## 2022-11-30 NOTE — Assessment & Plan Note (Signed)
Continue home Eliquis. 

## 2022-11-30 NOTE — Assessment & Plan Note (Signed)
Mild thrombocytopenia dating back to October 2022.  Unclear etiology, however potentially just ITP.  No intervention indicated at this time.

## 2022-11-30 NOTE — Assessment & Plan Note (Signed)
Per chart review, patient's creatinine has been gradually rising since March 2024 with uncertain etiology.  During this time, his torsemide was increased however he has been having increased edema as well.  Potentially congestive in nature but will also evaluate for retention.  - Bladder scans every shift - Renal ultrasound ordered - Daily BMP - Avoid nephrotoxic agents as able

## 2022-11-30 NOTE — ED Provider Notes (Signed)
[facility] Provider Note    Event Date/Time   First MD Initiated Contact with Patient 11/30/22 1127     (approximate)   History   Chief Complaint Leg Swelling   HPI  Marcus Kemp is a 89 y.o. male with past medical history of hypertension, diastolic CHF, atrial fibrillation on Eliquis, PE, and AAA who presents to the ED complaining of leg swelling.  Patient is very hard of hearing and majority of history is obtained from his daughter and wife at bedside.  They state that patient has had increasing swelling in his legs along with general weight gain over the past 2 days.  He has been feeling much weaker than usual, has gotten very out of breath even with minor exertion such as trying to get up out of bed.  They state he has had a nonproductive cough with no fevers.  Patient denies any pain in his chest, states he has been urinating normally.     Physical Exam   Triage Vital Signs: ED Triage Vitals  Enc Vitals Group     BP 11/30/22 1050 (!) 90/57     Pulse Rate 11/30/22 1050 63     Resp 11/30/22 1050 18     Temp 11/30/22 1050 97.7 F (36.5 C)     Temp Source 11/30/22 1050 Oral     SpO2 11/30/22 1050 99 %     Weight --      Height --      Head Circumference --      Peak Flow --      Pain Score 11/30/22 1046 0     Pain Loc --      Pain Edu? --      Excl. in GC? --     Most recent vital signs: Vitals:   11/30/22 1050 11/30/22 1200  BP: (!) 90/57 92/61  Pulse: 63 64  Resp: 18 16  Temp: 97.7 F (36.5 C)   SpO2: 99% 100%    Constitutional: Alert and oriented. Eyes: Conjunctivae are normal. Head: Atraumatic. Nose: No congestion/rhinnorhea. Mouth/Throat: Mucous membranes are moist.  Cardiovascular: Normal rate, regular rhythm. Grossly normal heart sounds.  2+ radial pulses bilaterally. Respiratory: Normal respiratory effort.  No retractions. Lungs CTAB. Gastrointestinal: Soft and nontender. No distention. Musculoskeletal: No lower  extremity tenderness, 2+ pitting edema to mid thighs bilaterally. Neurologic:  Normal speech and language. No gross focal neurologic deficits are appreciated.    ED Results / Procedures / Treatments   Labs (all labs ordered are listed, but only abnormal results are displayed) Labs Reviewed  CBC - Abnormal; Notable for the following components:      Result Value   RBC 3.53 (*)    Hemoglobin 12.0 (*)    HCT 37.0 (*)    MCV 104.8 (*)    RDW 18.9 (*)    Platelets 105 (*)    nRBC 0.6 (*)    All other components within normal limits  BRAIN NATRIURETIC PEPTIDE - Abnormal; Notable for the following components:   B Natriuretic Peptide 1,813.4 (*)    All other components within normal limits  BASIC METABOLIC PANEL - Abnormal; Notable for the following components:   Potassium 3.4 (*)    Chloride 96 (*)    Glucose, Bld 104 (*)    BUN 47 (*)    Creatinine, Ser 1.74 (*)    GFR, Estimated 36 (*)    All other components within normal limits  URINALYSIS, ROUTINE W  REFLEX MICROSCOPIC - Abnormal; Notable for the following components:   Color, Urine YELLOW (*)    APPearance CLEAR (*)    Glucose, UA 150 (*)    Hgb urine dipstick LARGE (*)    Protein, ur 30 (*)    All other components within normal limits  TROPONIN I (HIGH SENSITIVITY) - Abnormal; Notable for the following components:   Troponin I (High Sensitivity) 59 (*)    All other components within normal limits     EKG  ED ECG REPORT I, Chesley Noon, the attending physician, personally viewed and interpreted this ECG.   Date: 11/30/2022  EKG Time: 10:59  Rate: 63  Rhythm: normal sinus rhythm  Axis: Normal  Intervals:none  ST&T Change: Nonspecific ST/T abnormality  RADIOLOGY Chest x-ray reviewed and interpreted by me with pulmonary edema, no focal infiltrate or effusion noted.  PROCEDURES:  Critical Care performed: No  Procedures   MEDICATIONS ORDERED IN ED: Medications  midodrine (PROAMATINE) tablet 5 mg (has no  administration in time range)  furosemide (LASIX) injection 40 mg (40 mg Intravenous Given 11/30/22 1240)     IMPRESSION / MDM / ASSESSMENT AND PLAN / ED COURSE  I reviewed the triage vital signs and the nursing notes.                              89 y.o. male with past medical history of hypertension, diastolic CHF, atrial fibrillation on Eliquis, PE, and AAA who presents to the ED with increasing leg swelling, weight gain, and dyspnea on exertion over the past 2 days.  Patient's presentation is most consistent with acute presentation with potential threat to life or bodily function.  Differential diagnosis includes, but is not limited to, ACS, CHF exacerbation, electrolyte abnormality, AKI, PE, pneumonia.  Patient chronically ill but nontoxic-appearing and in no acute distress, vital signs are unremarkable.  He reports significant dyspnea with any exertion but is currently breathing comfortably with oxygen saturations of 100% on room air.  He does appear significantly fluid overloaded with lower extremity edema, chest x-ray also shows developing pulmonary edema, no evidence of pneumonia.  Labs thus far show stable anemia with no significant leukocytosis, BMP, troponin, and BNP are pending.  Labs remarkable for mild AKI, BNP elevated consistent with CHF exacerbation.  Troponin mildly elevated but similar compared to previous.  We will diurese with IV Lasix and case discussed with hospitalist for admission.  Patient previously prescribed midodrine for use as needed and we will give dose here in the ED given ongoing borderline blood pressures.      FINAL CLINICAL IMPRESSION(S) / ED DIAGNOSES   Final diagnoses:  Acute on chronic diastolic congestive heart failure (HCC)  AKI (acute kidney injury) (HCC)     Rx / DC Orders   ED Discharge Orders     None        Note:  This document was prepared using Dragon voice recognition software and may include unintentional dictation errors.    Chesley Noon, MD 11/30/22 1249

## 2022-11-30 NOTE — Assessment & Plan Note (Signed)
Most recent echocardiogram in February demonstrated moderate to severe MR.  Unlikely patient is a surgical candidate.

## 2022-11-30 NOTE — Assessment & Plan Note (Signed)
Patient is presenting with gradually worsening lower extremity edema and weight gain that significantly worsened over the last 48 hours despite home diuresis with torsemide 40 mg twice daily.  Most recent echocardiogram in February 2024 demonstrating preserved EF at 55-60%, moderate LVH, mildly reduced RV systolic function and severely elevated PASP.  - Telemetry monitoring - Start Lasix 40 mg IV twice daily - Restart home midodrine 5 mg 3 times daily given soft blood pressures - Strict in and out - Daily weights - If diuresis is limited, may require cardiology consultation

## 2022-11-30 NOTE — ED Triage Notes (Signed)
Patient to ED via POV for increased bilateral leg swelling more notable since last PM. Patient states he has been having dyspnea with exertion. Taking fluid pill at home as prescribed. Increased weight gain per daughter.

## 2022-11-30 NOTE — Evaluation (Signed)
Physical Therapy Evaluation Patient Details Name: Marcus Kemp MRN: [id number] DOB: 1924/10/26 Today's Date: 11/30/2022  History of Present Illness  Pt admitted for acute/chronic heart failure with preserved EF and complaints of B LE swelling/weakness. HIstory includes heart failure, pulm hyptension, HTN, Afib, renal cell carcinoma, and reheumatic fever.  Clinical Impression  Pt is a pleasant 89 year old male who was admitted for acute on chronic heart failure with preserved EF and complaints of B LE swelling. Pt performs bed mobility/transfers with cga. Due to decreased O2 sats, further mobility stopped and RN notified. Pt with lips and finger tips cyanotic; RN notified. O2 sensor on nose.  Pt demonstrates deficits with strength/mobility. Anticipate when O2 sats are stabilized, pt should be able to ambulate without difficulty. Would benefit from skilled PT to address above deficits and promote optimal return to PLOF. Pt will continue to receive skilled PT services while admitted and will defer to TOC/care team for updates regarding disposition planning.      Recommendations for follow up therapy are one component of a multi-disciplinary discharge planning process, led by the attending physician.  Recommendations may be updated based on patient status, additional functional criteria and insurance authorization.  Follow Up Recommendations       Assistance Recommended at Discharge Set up Supervision/Assistance  Patient can return home with the following  A little help with walking and/or transfers    Equipment Recommendations None recommended by PT  Recommendations for Other Services       Functional Status Assessment Patient has had a recent decline in their functional status and demonstrates the ability to make significant improvements in function in a reasonable and predictable amount of time.     Precautions / Restrictions Precautions Precautions: Fall Restrictions Weight  Bearing Restrictions: No      Mobility  Bed Mobility Overal bed mobility: Needs Assistance Bed Mobility: Supine to Sit     Supine to sit: Min guard     General bed mobility comments: safe technique and once seated at EOB, upright posture noted    Transfers Overall transfer level: Needs assistance Equipment used: 1 person hand held assist Transfers: Sit to/from Stand Sit to Stand: Min guard           General transfer comment: Once standing, pt slightly cyanotic and O2 sats drop quickly to 80%.No SOB symptoms noted and once returned to bed, sats increase quickly to 95%.    Ambulation/Gait               General Gait Details: not performed secondary to O2 sats  Stairs            Wheelchair Mobility    Modified Rankin (Stroke Patients Only)       Balance Overall balance assessment: Needs assistance Sitting-balance support: Feet supported Sitting balance-Leahy Scale: Good     Standing balance support: Single extremity supported Standing balance-Leahy Scale: Good                               Pertinent Vitals/Pain Pain Assessment Pain Assessment: No/denies pain    Home Living Family/patient expects to be discharged to:: Private residence Living Arrangements: Spouse/significant other Available Help at Discharge: Family;Available 24 hours/day Type of Home: House Home Access: Stairs to enter   [employer] of Steps: 2 steps into sunken den (railing available)   Home Layout: One level Home Equipment: Crutches;Cane - single point;Grab bars - tub/shower  Prior Function Prior Level of Function : Independent/Modified Independent             Mobility Comments: reports no recent falls and has been using [facility] for mobility. Wife is concerned about posture with exertion ADLs Comments: Mod I for ADLs. Wife assists as needed.     Hand Dominance        Extremity/Trunk Assessment   Upper Extremity Assessment Upper  Extremity Assessment: Overall WFL for tasks assessed    Lower Extremity Assessment Lower Extremity Assessment: Generalized weakness (B LE grossly 4/5)       Communication   Communication: HOH  Cognition Arousal/Alertness: Awake/alert Behavior During Therapy: WFL for tasks assessed/performed Overall Cognitive Status: Within Functional Limits for tasks assessed                                 General Comments: sleepy, however easily awakens. Very HOH        General Comments      Exercises     Assessment/Plan    PT Assessment Patient needs continued PT services  PT Problem List Decreased strength;Decreased activity tolerance;Decreased balance;Decreased mobility;Cardiopulmonary status limiting activity       PT Treatment Interventions Gait training;DME instruction;Therapeutic exercise    PT Goals (Current goals can be found in the Care Plan section)  Acute Rehab PT Goals Patient Stated Goal: to have less swelling PT Goal Formulation: With patient Time For Goal Achievement: 12/14/22 Potential to Achieve Goals: Good    Frequency Min 2X/week     Co-evaluation               AM-PAC PT "6 Clicks" Mobility  Outcome Measure Help needed turning from your back to your side while in a flat bed without using bedrails?: None Help needed moving from lying on your back to sitting on the side of a flat bed without using bedrails?: A Little Help needed moving to and from a bed to a chair (including a wheelchair)?: A Little Help needed standing up from a chair using your arms (e.g., wheelchair or bedside chair)?: A Little Help needed to walk in hospital room?: A Little Help needed climbing 3-5 steps with a railing? : A Little 6 Click Score: 19    End of Session   Activity Tolerance: Treatment limited secondary to medical complications (Comment) Patient left: in bed;with family/visitor present Nurse Communication: Mobility status PT Visit Diagnosis: Muscle  weakness (generalized) (M62.81);Difficulty in walking, not elsewhere classified (R26.2)    Time: 1550-1601 PT Time Calculation (min) (ACUTE ONLY): 11 min   Charges:   PT Evaluation $PT Eval Low Complexity: 1 Low          Elizabeth Palau, PT, DPT, GCS [phone]   Zylah Elsbernd 11/30/2022, 4:52 PM

## 2022-12-01 ENCOUNTER — Ambulatory Visit: Admit: 2022-12-01 | Payer: Medicare Other | Admitting: Ophthalmology

## 2022-12-01 ENCOUNTER — Encounter: Payer: Self-pay | Admitting: Internal Medicine

## 2022-12-01 DIAGNOSIS — E876 Hypokalemia: Secondary | ICD-10-CM | POA: Diagnosis present

## 2022-12-01 DIAGNOSIS — I5033 Acute on chronic diastolic (congestive) heart failure: Secondary | ICD-10-CM | POA: Diagnosis not present

## 2022-12-01 LAB — CBC WITH DIFFERENTIAL/PLATELET
Abs Immature Granulocytes: 0.02 10*3/uL (ref 0.00–0.07)
Basophils Absolute: 0.1 10*3/uL (ref 0.0–0.1)
Basophils Relative: 1 %
Eosinophils Absolute: 0.1 10*3/uL (ref 0.0–0.5)
Eosinophils Relative: 1 %
HCT: 35.8 % — ABNORMAL LOW (ref 39.0–52.0)
Hemoglobin: 11.8 g/dL — ABNORMAL LOW (ref 13.0–17.0)
Immature Granulocytes: 0 %
Lymphocytes Relative: 27 %
Lymphs Abs: 1.3 10*3/uL (ref 0.7–4.0)
MCH: 34.1 pg — ABNORMAL HIGH (ref 26.0–34.0)
MCHC: 33 g/dL (ref 30.0–36.0)
MCV: 103.5 fL — ABNORMAL HIGH (ref 80.0–100.0)
Monocytes Absolute: 0.6 10*3/uL (ref 0.1–1.0)
Monocytes Relative: 12 %
Neutro Abs: 2.9 10*3/uL (ref 1.7–7.7)
Neutrophils Relative %: 59 %
Platelets: 86 10*3/uL — ABNORMAL LOW (ref 150–400)
RBC: 3.46 MIL/uL — ABNORMAL LOW (ref 4.22–5.81)
RDW: 18.8 % — ABNORMAL HIGH (ref 11.5–15.5)
WBC: 4.9 10*3/uL (ref 4.0–10.5)
nRBC: 0.4 % — ABNORMAL HIGH (ref 0.0–0.2)

## 2022-12-01 LAB — BASIC METABOLIC PANEL
Anion gap: 8 (ref 5–15)
BUN: 42 mg/dL — ABNORMAL HIGH (ref 8–23)
CO2: 30 mmol/L (ref 22–32)
Calcium: 7.7 mg/dL — ABNORMAL LOW (ref 8.9–10.3)
Chloride: 102 mmol/L (ref 98–111)
Creatinine, Ser: 1.41 mg/dL — ABNORMAL HIGH (ref 0.61–1.24)
GFR, Estimated: 46 mL/min — ABNORMAL LOW (ref >60)
Glucose, Bld: 74 mg/dL (ref 70–99)
Potassium: 3.3 mmol/L — ABNORMAL LOW (ref 3.5–5.1)
Sodium: 140 mmol/L (ref 135–145)

## 2022-12-01 LAB — MAGNESIUM: Magnesium: 2.3 mg/dL (ref 1.7–2.4)

## 2022-12-01 SURGERY — PHACOEMULSIFICATION, CATARACT, WITH IOL INSERTION
Anesthesia: Topical | Laterality: Left

## 2022-12-01 MED ORDER — FUROSEMIDE 10 MG/ML IJ SOLN
4.0000 mg/h | INTRAVENOUS | Status: DC
  Administered 2022-12-01: 8 mg/h via INTRAVENOUS
  Administered 2022-12-02 – 2022-12-04 (×2): 4 mg/h via INTRAVENOUS
  Filled 2022-12-01 (×3): qty 20

## 2022-12-01 MED ORDER — POTASSIUM CHLORIDE 20 MEQ PO PACK
40.0000 meq | PACK | Freq: Four times a day (QID) | ORAL | Status: AC
  Administered 2022-12-01 (×2): 40 meq via ORAL
  Filled 2022-12-01 (×2): qty 2

## 2022-12-01 NOTE — Progress Notes (Signed)
Physical Therapy Treatment Patient Details Name: Marcus Kemp MRN: [id number] DOB: 01-02-1925 Today's Date: 12/01/2022   History of Present Illness Pt admitted for acute/chronic heart failure with preserved EF and complaints of B LE swelling/weakness. HIstory includes heart failure, pulm hyptension, HTN, Afib, renal cell carcinoma, and reheumatic fever.    PT Comments    Pt is making good progress towards goals with ability to stand with [facility] and ultimately demonstrates improved balance with RW. Able to ambulate short distance in hallway with RW with O2 sats WNL and HR WNL. Will continue to progress. Left seated in recliner, RN aware.   Recommendations for follow up therapy are one component of a multi-disciplinary discharge planning process, led by the attending physician.  Recommendations may be updated based on patient status, additional functional criteria and insurance authorization.  Follow Up Recommendations       Assistance Recommended at Discharge Set up Supervision/Assistance  Patient can return home with the following A little help with walking and/or transfers   Equipment Recommendations  None recommended by PT    Recommendations for Other Services       Precautions / Restrictions Precautions Precautions: Fall Restrictions Weight Bearing Restrictions: No     Mobility  Bed Mobility Overal bed mobility: Needs Assistance Bed Mobility: Supine to Sit, Sit to Supine     Supine to sit: Min guard     General bed mobility comments: safe technique with ease of mobility    Transfers Overall transfer level: Needs assistance Equipment used: Straight cane Transfers: Sit to/from Stand Sit to Stand: Min guard           General transfer comment: Pt very eager to stand and able to stand with ease and SPC. Pt then switched to RW and demonstrated improved balance/safety awareness.    Ambulation/Gait Ambulation/Gait assistance: Min guard Gait Distance (Feet): 50  Feet Assistive device: Rolling walker (2 wheels) Gait Pattern/deviations: Step-through pattern       General Gait Details: ambulated using RW and slow step to gait pattern. O2 sats WNL with exertion. +2 for equipment. Fatigues with exeriton   Social research officer, government Rankin (Stroke Patients Only)       Balance Overall balance assessment: Needs assistance Sitting-balance support: Feet supported Sitting balance-Leahy Scale: Good     Standing balance support: Bilateral upper extremity supported Standing balance-Leahy Scale: Fair                              Cognition Arousal/Alertness: Awake/alert Behavior During Therapy: WFL for tasks assessed/performed Overall Cognitive Status: Within Functional Limits for tasks assessed                                 General Comments: very HOH        Exercises      General Comments General comments (skin integrity, edema, etc.): O2 probe on nose      Pertinent Vitals/Pain Pain Assessment Pain Assessment: No/denies pain    Home Living Family/patient expects to be discharged to:: Private residence Living Arrangements: Spouse/significant other;Children                      Prior Function            PT Goals (current goals can now  be found in the care plan section) Acute Rehab PT Goals Patient Stated Goal: to have less swelling PT Goal Formulation: With patient Time For Goal Achievement: 12/14/22 Potential to Achieve Goals: Good Progress towards PT goals: Progressing toward goals    Frequency    Min 2X/week      PT Plan Current plan remains appropriate    Co-evaluation              AM-PAC PT "6 Clicks" Mobility   Outcome Measure  Help needed turning from your back to your side while in a flat bed without using bedrails?: None Help needed moving from lying on your back to sitting on the side of a flat bed without using bedrails?: A  Little Help needed moving to and from a bed to a chair (including a wheelchair)?: A Little Help needed standing up from a chair using your arms (e.g., wheelchair or bedside chair)?: A Little Help needed to walk in hospital room?: A Little Help needed climbing 3-5 steps with a railing? : A Little 6 Click Score: 19    End of Session Equipment Utilized During Treatment: Gait belt Activity Tolerance: Patient tolerated treatment well Patient left: in chair;with call bell/phone within reach;with family/visitor present Nurse Communication: Mobility status PT Visit Diagnosis: Muscle weakness (generalized) (M62.81);Difficulty in walking, not elsewhere classified (R26.2)     Time: 1331-1401 PT Time Calculation (min) (ACUTE ONLY): 30 min  Charges:  $Gait Training: 23-37 mins                     Elizabeth Palau, PT, DPT, GCS [phone]    Marcus Kemp 12/01/2022, 4:09 PM

## 2022-12-01 NOTE — Consult Note (Signed)
[facility] CLINIC CARDIOLOGY CONSULT NOTE       Patient ID: Marcus Kemp MRN: [id number] DOB/AGE: [birth year] 89 y.o.  Admit date: 11/30/2022 Referring Physician Dr. Joylene Igo Primary Physician Dr. Judithann Sheen  Primary Cardiologist Dr. 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  Cardiology is consulted for further assistance.  The patient is well-known to our service from a previous hospitalization in February 2024, and has been followed with Leanora Ivanoff, PA-C and Dr. Darrold Junker on an outpatient basis for years.  He presents with his son in law and daughter by phone who provides the majority of the history.  The patient's daughter notes that he has had significant difficulty ambulating due to heaviness in his lower legs from fluid accumulation.  Some days he has gained 7 pounds overnight which he takes an extra dose of torsemide 20 mg.  He has been followed closely by the heart failure clinic for intermittent checking of BMP and increase of his torsemide dosing as well.  His daughter notes that the patient lives at home independently with his wife who is also elderly.  Sometimes she will give him potato chips at night, but his children try very hard to have him stick to a 48 ounce fluid restriction and minimize all salty foods in the home.  His daughter expresses concern with his repeated hospitalizations and that he is becoming significantly weaker in general.  At my time of evaluation he is sitting upright in bed in awakens to loud voice.  He says he feels "sleepy" but denies chest pain, heart racing or palpitations.  He says he feels short of breath whenever he  gets walking, but none presently at rest.  His legs are swollen to the point where they were seeping fluid, but his peripheral edema has improved somewhat with initial IV Lasix, with net -850 mL documented so far.  Recent vitals notable for a blood pressure of 96/61, heart rate in the 60s in sinus rhythm with PVCs in a pattern of bigeminy on telemetry.  He is comfortable on room air.  Labs notable for BUN/creatinine slightly improving with diuresis from 47/1.74 and GFR of 36-42/1.41 and GFR 46.  Potassium low at 3.3, mag within normal limits at 2.3.  BNP elevated at 1813.  High-sensitivity troponin borderline elevated at 59.  H&H around baseline 11.8/35.8, chronic thrombocytopenia with platelets 86,000.  Chest x-ray with bibasilar atelectasis, likely pulmonary edema with bilateral pleural effusions.  Review of systems complete and found to be negative unless listed above     Past Medical History:  Diagnosis Date   A-fib (HCC)    a.) CHA2DS2-VASc = 4 (age x 2, CHF, aortic plaque). b.) s/p unsuccessful TEE cardioversion 04/28/2016 --> rec'd 200 J x1 and 360 J x 2. c.) rate/rhythm maintained on oral metoprolol succinante; chronically anticoagulated using dose reduced apixaban.   Anemia    Aortic atherosclerosis (HCC)    Arthritis    CHF (congestive heart failure) (HCC)    a.) TTE 04/26/2016: EF >55%; mod LVH; MVP; mild-mod LAE, mild MR. b.) TTE 05/14/209: EF >55%; mod RVE; mild BAE; mod MR.TR; triv AR/PR. c.) TTE 03/28/2019: EF >55%; mild LVH; mod BAE;  mild PR, mod MR/TR.   GERD (gastroesophageal reflux disease)    Heart murmur    History of kidney stones    HOH (hard of hearing)    a.) hearing aid to LEFT ear.   Hyperlipidemia    Hypertension    Infrarenal abdominal aortic aneurysm (AAA) without rupture (HCC) 05/05/2021   a.) CT 05/05/2021 --> measured 3.0 cm.   MVP (mitral valve prolapse)    Peripheral edema    Pneumonia    Pulmonary embolus (HCC)    Pulmonary HTN (HCC) 04/26/2016   a.)  TTE 04/26/2016: EF >55%; PASP 45. b.) TTE 12/06/2017: EF >55%; RVSP 76.6 mmHg. c.) TTE 03/28/2019: EF >55%; RVSP 77.8 mmHg.   Renal cell carcinoma (HCC)    Rheumatic fever    Sepsis (HCC)    Valvular heart disease     Past Surgical History:  Procedure Laterality Date   INGUINAL HERNIA REPAIR Left 01/13/2022   Procedure: HERNIA REPAIR INGUINAL ADULT;  Surgeon: Earline Mayotte, MD;  Location: [facility] ORS;  Service: General;  Laterality: Left;   INSERTION OF MESH  01/13/2022   Procedure: INSERTION OF MESH;  Surgeon: Earline Mayotte, MD;  Location: [facility] ORS;  Service: General;;   LITHOTRIPSY     TEE WITH CARDIOVERSION N/A    P   TONSILLECTOMY      (Not in a hospital admission)  Social History   Socioeconomic History   Marital status: Married    Spouse name: Not on file   Number of children: Not on file   Years of education: Not on file   Highest education level: Not on file  Occupational History   Not on file  Tobacco Use   Smoking status: Former    Packs/day: 0.25    Years: 8.00    Additional pack years: 0.00    Total pack years: 2.00    Types: Cigarettes    Quit date: 30    Years since quitting: 84.4   Smokeless tobacco: Never  Vaping Use   Vaping Use: Never used  Substance and Sexual Activity   Alcohol use: No   Drug use: Never   Sexual activity: Not on file  Other Topics Concern   Not on file  Social History Narrative   Not on file   Social Determinants of Health   Financial Resource Strain: Not on file  Food Insecurity: No Food Insecurity (09/10/2022)   Hunger Vital Sign    Worried About Running Out of Food in the Last Year: Never true    Ran Out of Food in the Last Year: Never true  Transportation Needs: No Transportation Needs (09/10/2022)   PRAPARE - Administrator, Civil Service (Medical): No    Lack of Transportation (Non-Medical): No  Physical Activity: Not on file  Stress: Not on file  Social Connections: Not on file  Intimate  Partner Violence: Not At Risk (09/10/2022)   Humiliation, Afraid, Rape, and Kick questionnaire    Fear of Current or Ex-Partner: No    Emotionally Abused: No    Physically Abused: No    Sexually Abused: No    Family History  Problem Relation Age of Onset   Heart disease Sister    Hypertension Other      No intake or output data in the 24 hours ending 12/01/22 1017  Vitals:   12/01/22 0715 12/01/22 0800 12/01/22 0900 12/01/22 0930  BP:  92/60 (!) 92/58   Pulse:  64 (!) 59  Resp:  17 14   Temp:      TempSrc:      SpO2: 91% 90%  96%    PHYSICAL EXAM General: Elderly and chronically ill-appearing thin Caucasian male.  Sitting upright in bed with son-in-law at bedside, and daughter present by phone. HEENT:  Normocephalic and atraumatic.  Hard of hearing. Neck:  No JVD.  Lungs: Normal respiratory effort on room air.  Decreased breath sounds with trace crackles bilaterally.   Heart: HRRR . Normal S1 and S2, 3/6 blowing holosystolic murmur best heard at the apex. Abdomen: Nontender to palpation, distended. Msk: Generalized weakness. Extremities: Chronic hyperpigmentation and woody edema, multiple excoriations and wounds with 2+ pitting bilaterally.  Neuro: Somnolent, awakens to voice but falls asleep quickly during interview. Psych:  Answers questions appropriately.   Labs: Basic Metabolic Panel: Recent Labs    11/30/22 1201 12/01/22 0522  NA 138 140  K 3.4* 3.3*  CL 96* 102  CO2 30 30  GLUCOSE 104* 74  BUN 47* 42*  CREATININE 1.74* 1.41*  CALCIUM 8.9 7.7*  MG  --  2.3   Liver Function Tests: No results for input(s): "AST", "ALT", "ALKPHOS", "BILITOT", "PROT", "ALBUMIN" in the last 72 hours. No results for input(s): "LIPASE", "AMYLASE" in the last 72 hours. CBC: Recent Labs    11/30/22 1051 12/01/22 0522  WBC 5.0 4.9  NEUTROABS  --  2.9  HGB 12.0* 11.8*  HCT 37.0* 35.8*  MCV 104.8* 103.5*  PLT 105* 86*   Cardiac Enzymes: Recent Labs    11/30/22 1051   TROPONINIHS 59*   BNP: Recent Labs    11/30/22 1051  BNP 1,813.4*   D-Dimer: No results for input(s): "DDIMER" in the last 72 hours. Hemoglobin A1C: No results for input(s): "HGBA1C" in the last 72 hours. Fasting Lipid Panel: No results for input(s): "CHOL", "HDL", "LDLCALC", "TRIG", "CHOLHDL", "LDLDIRECT" in the last 72 hours. Thyroid Function Tests: No results for input(s): "TSH", "T4TOTAL", "T3FREE", "THYROIDAB" in the last 72 hours.  Invalid input(s): "FREET3" Anemia Panel: No results for input(s): "VITAMINB12", "FOLATE", "FERRITIN", "TIBC", "IRON", "RETICCTPCT" in the last 72 hours.   Radiology: US RENAL  Result Date: 11/30/2022 CLINICAL DATA:  Acute kidney injury EXAM: RENAL / URINARY TRACT ULTRASOUND COMPLETE COMPARISON:  Renal ultrasound 09/09/2022.  CT abdomen 12/24/2021 FINDINGS: Right Kidney: Renal measurements: 10.1 x 4.8 x 4.5 cm = volume: 115 mL. No hydronephrosis. Increased echogenicity is seen throughout. There is a 1.7 x 1.4 x 1.8 cm cyst in the upper pole. Left Kidney: Renal measurements: 10.3 x 4.9 x 4.1 cm = volume: 107 mL. No hydronephrosis. Normal echogenicity. Are 2 cysts identified in the upper pole measuring 2.1 x 1.0 x 2.0 cm and 1.1 x 1.0 x 1.2 cm. Bladder: Appears normal for degree of bladder distention. Other: There is trace free fluid around the liver and spleen. IMPRESSION: 1. No hydronephrosis. 2. Increased echogenicity in the right kidney is nonspecific but can be seen in the setting of medical renal disease. 3. Bilateral renal cysts. 4. Trace free fluid around the liver and spleen. Electronically Signed   By: Darliss Cheney M.D.   On: 11/30/2022 17:16   DG Chest 2 View  Result Date: 11/30/2022 CLINICAL DATA:  Shortness of breath. EXAM: CHEST - 2 VIEW COMPARISON:  October 31, 2022. FINDINGS: Stable cardiomegaly. Minimal bibasilar subsegmental atelectasis or edema is noted with minimal pleural effusions. Bony thorax is unremarkable. IMPRESSION: Minimal bibasilar  subsegmental atelectasis or edema is noted with minimal bilateral pleural  effusions. Aortic Atherosclerosis (ICD10-I70.0). Electronically Signed   By: Lupita Raider M.D.   On: 11/30/2022 11:46    ECHO 08/2022 1. Left ventricular ejection fraction, by estimation, is 55 to 60%. The  left ventricle has normal function. The left ventricle has no regional  wall motion abnormalities. There is moderate left ventricular hypertrophy.  Left ventricular diastolic  parameters are indeterminate. There is the interventricular septum is  flattened in systole and diastole, consistent with right ventricular  pressure and volume overload.   2. Right ventricular systolic function is mildly reduced. The right  ventricular size is moderately enlarged. There is severely elevated  pulmonary artery systolic pressure. The estimated right ventricular  systolic pressure is 70.4 mmHg.   3. Left atrial size was moderately dilated.   4. Right atrial size was mildly dilated.   5. The mitral valve is normal in structure. Moderate to severe mitral  valve regurgitation. No evidence of mitral stenosis.   6. Tricuspid valve regurgitation is moderate to severe.   7. The aortic valve has an indeterminant number of cusps. Aortic valve  regurgitation is not visualized. Aortic valve sclerosis/calcification is  present, without any evidence of aortic stenosis.   8. There is borderline dilatation of the ascending aorta, measuring 36  mm.   9. The inferior vena cava is dilated in size with <50% respiratory  variability, suggesting right atrial pressure of 15 mmHg.   TELEMETRY reviewed by me (LT) 12/01/2022 : Sinus rhythm rate 60s  EKG reviewed by me: Sinus rhythm first-degree AV block nonspecific T wave abnormality  Data reviewed by me (LT) 12/01/2022: Last outpatient cardiology note, ED note, admission H&P last 24h vitals tele labs imaging I/O   Principal Problem:   Acute on chronic heart failure with preserved ejection  fraction (HFpEF) (HCC) Active Problems:   Longstanding persistent atrial fibrillation (HCC)   Rheumatic mitral regurgitation   Macrocytic anemia   Pulmonary embolism (HCC)   Thrombocytopenia (HCC)   Hematuria   AKI (acute kidney injury) (HCC)   Hypotension    ASSESSMENT AND PLAN:  Benjie Karvonen. 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  Cardiology is consulted for further assistance.  # Acute on chronic HFpEF # Severe rheumatic MR Presents with significant peripheral edema extending from his feet up into his groin and abdomen despite adjustment of his torsemide on an outpatient basis, limiting his ability to ambulate due to the weight of his legs. Dietary indiscretions with potato chips likely contributory, but family does their best to ensure that the patient appears to his salt and fluid restricted diet.  On admission BNP elevated to 1800 and chest x-ray with pleural effusions.  He is clinically hypervolemic on exam today, although with clinical improvement after initial diuresis. -S/p IV Lasix 40 mg x 1 -Will start a Lasix drip at 8 mg/h as BP is marginal -Hold metoprolol for now with borderline blood pressures -Additional GDMT limited by hypotension and renal insufficiency -Strict salt and fluid restriction, strict I's/O -Defer additional cardiac diagnostics at this time. -The patient would benefit from palliative care evaluation/goals of care discussions with the patient's daughter present (as per her request) with repeat hospitalizations and advanced age.  # Paroxysmal atrial fibrillation In sinus rhythm on  telemetry.  Continue Eliquis 5 mg twice daily for stroke prevention, and with history of recent pulmonary  embolus in February 2024.  Note for signs/symptoms of bleeding, as he has a history of complications with full dose Eliquis  # AKI Renal function improving with diuresis from creatinine 1.7--1.4 today, current GFR 46.  # Demand ischemia Borderline elevated at 59, which in the absence of chest pain or EKG changes in the setting of acute on chronic HFpEF as above, this is most consistent with demand/supply mismatch and not ACS   This patient's plan of care was discussed and created with Dr. Darrold Junker and he is in agreement.  Signed: Rebeca Allegra , PA-C 12/01/2022, 10:17 AM [facility]

## 2022-12-01 NOTE — Consult Note (Addendum)
   Heart Failure Nurse Navigator Note  HFpEF 55-60.  Moderate LVH.  Right ventricular systolic function is moderately reduced.  Moderate left atrial enlargement.  Mild right atrial enlargement.  Moderate to severe mitral valve regurgitation.  Moderate to severe tricuspid regurgitation.  He presented to the emergency room with complaints of worsening leg swelling, dyspnea on exertion and weight increase.  BNP 1813.  Chest x-ray revealed minimal basilar edema with minimal pleural effusions.  Comorbidities:  Abdominal aortic aneurysm Hyperlipidemia Hypertension Chronic kidney disease Atrial fibrillation Anemia GERD Hard of hearing  Medications:  Apixaban 5 mg 2 times a day Furosemide infusion at 8 mg an hour  metoprolol succinate 25 mg daily Midodrine 5 mg 3 times a day with meals Potassium chloride 40 mEq every 6 hours  Labs:   Sodium 140, potassium 3.3, chloride 102, CO2 30, BUN 42, creatinine 1.41, estimated GFR 46 Weight not documented Intake not documented Output not documented   Initial meeting with patient who is lying quietly on a gurney in the ED.  Currently on room air.  There were no family members present.  He is very hard of hearing but is able to understand when you speak into his left ear.  He keeps repeating that he does not understand where all this fluid is coming from felt that he was being compliant with his fluid intake and taking his medications as ordered.  Son in-law later joined in the ED.  He felt that the patient was drinking more than he should, At one time patient was drinking tomato juice.  He tried to get his wife Cala Bradford on the phone but she was at work and did not pick up.  He was last seen in the outpatient heart failure clinic on April 3 and weight was felt to be stable at that time.  He also started Jardiance 10 mg.  Instructed to discontinue lasix and to begin  torsemide 40 mg in the Am and 20 mg PM.  Will attempt to meet with the wife  later today.    Tresa Endo RN

## 2022-12-01 NOTE — TOC Initial Note (Signed)
Transition of Care [facility]) - Initial/Assessment Note    Patient Details  Name: Marcus Kemp MRN: [id number] Date of Birth: 01/28/1925  Transition of Care [facility]) CM/SW Contact:    Margarito Liner, LCSW Phone Number: 12/01/2022, 2:36 PM  Clinical Narrative:  CSW met with patient. Son-in-law at bedside. CSW introduced role and explained that therapy recommendations would be discussed. Son-in-law called daughter who stated she will talk to patient's wife regarding home health recommendations. They were set up with [facility] last admission but they determined he did not need services at that time. If they decide to move forward with home health this admission, [facility] will likely be first preference. No DME recommendations at this time. No further concerns. CSW encouraged patient and his family to contact CSW as needed. CSW will continue to follow patient and his family for support and facilitate return home once stable.                Expected Discharge Plan: Home w Home Health Services Barriers to Discharge: Continued Medical Work up   Patient Goals and CMS Choice            Expected Discharge Plan and Services     Post Acute Care Choice:  (TBD) Living arrangements for the past 2 months: Single Family Home                                      Prior Living Arrangements/Services Living arrangements for the past 2 months: Single Family Home Lives with:: Spouse Patient language and need for interpreter reviewed:: Yes Do you feel safe going back to the place where you live?: Yes      Need for Family Participation in Patient Care: Yes (Comment) Care giver support system in place?: Yes (comment)   Criminal Activity/Legal Involvement Pertinent to Current Situation/Hospitalization: No - Comment as needed  Activities of Daily Living Home Assistive Devices/Equipment: Cane (specify quad or straight), Hearing aid, Eyeglasses ADL Screening (condition at time of admission) Patient's  cognitive ability adequate to safely complete daily activities?: Yes Is the patient deaf or have difficulty hearing?: No Does the patient have difficulty seeing, even when wearing glasses/contacts?: No Does the patient have difficulty concentrating, remembering, or making decisions?: No Patient able to express need for assistance with ADLs?: Yes Does the patient have difficulty dressing or bathing?: No Independently performs ADLs?: No Communication: Independent Dressing (OT): Independent Grooming: Independent Feeding: Independent Bathing: Needs assistance Is this a change from baseline?: Change from baseline, expected to last <3 days Toileting: Needs assistance Is this a change from baseline?: Change from baseline, expected to last <3 days In/Out Bed: Needs assistance Is this a change from baseline?: Change from baseline, expected to last <3 days Walks in Home: Needs assistance Is this a change from baseline?: Change from baseline, expected to last <3 days Does the patient have difficulty walking or climbing stairs?: Yes Weakness of Legs: None Weakness of Arms/Hands: None  Permission Sought/Granted Permission sought to share information with : Family Supports    Share Information with NAME: [name]     Permission granted to share info w Relationship: Daughter  Permission granted to share info w Contact Information: [phone]  Emotional Assessment Appearance:: Appears stated age Attitude/Demeanor/Rapport: Unable to Assess Affect (typically observed): Unable to Assess Orientation: : Oriented to Self, Oriented to Place, Oriented to  Time, Oriented to Situation Alcohol / Substance Use:  Not Applicable Psych Involvement: No (comment)  Admission diagnosis:  Acute on chronic heart failure with preserved ejection fraction (HFpEF) (HCC) [I50.33] Patient Active Problem List   Diagnosis Date Noted   Acute on chronic heart failure with preserved ejection fraction (HFpEF)  (HCC) 11/30/2022   Thrombocytopenia (HCC) 11/30/2022   Hematuria 11/30/2022   AKI (acute kidney injury) (HCC) 11/30/2022   Hypotension 11/30/2022   Pulmonary embolism (HCC) 09/10/2022   CHF (congestive heart failure) (HCC) 09/09/2022   Acute respiratory failure with hypoxia (HCC) 09/09/2022   Anemia 01/27/2022   Hyperlipidemia 01/27/2022   MVP (mitral valve prolapse) 01/27/2022   Nephrolithiasis 01/27/2022   Rheumatic fever 01/27/2022   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   Anasarca 01/27/2022   Macrocytic anemia 01/27/2022   Rheumatic tricuspid valve regurgitation 09/19/2017   Status post catheter ablation of atrial flutter 07/13/2017   Jugular venous distension 07/12/2017   Peripheral edema 01/31/2017   Acute on chronic diastolic CHF (congestive heart failure) (HCC) 09/24/2016   Longstanding persistent atrial fibrillation (HCC) 05/11/2016   Epidural abscess 04/25/2016   Muscle abscess 04/25/2016   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 [place] [address] Phone: [phone] Fax: [phone]  [facility] [address] [place] [address] Phone: [phone] Fax: [phone]     Social Determinants of Health (SDOH) Social History: SDOH Screenings   Food Insecurity: No Food Insecurity (12/01/2022)  Housing: Low Risk  (12/01/2022)  Transportation Needs: No Transportation Needs (12/01/2022)  Utilities: Not At Risk (12/01/2022)  Tobacco Use: Medium Risk (12/01/2022)   SDOH Interventions:     Readmission Risk Interventions     No data to display

## 2022-12-01 NOTE — Progress Notes (Signed)
Progress Note   Patient: Marcus Kemp UJW:[id number] DOB: [birth year] DOA: 11/30/2022     1 DOS: the patient was seen and examined on 12/01/2022   Brief hospital course:  Marcus Kemp is a 89 y.o. 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  He notes that he has been experiencing increasing lower extremity swelling but is unclear for how long it has been going on.  He denies any chest pain or shortness of breath at this time.  He notes that he is been having trouble walking due to the heaviness of his legs.  Per chart review, family previously at bedside stated swelling in legs has been occurring for the last 48 hours despite diuresis with home torsemide.  In addition, his weight increased from 135 to 142 in this time.  Family noted that he was having increased generalized weakness and was having dyspnea on exertion.  They had noted a nonproductive cough but no shortness of breath at rest, fevers, chills, chest pain or palpitations.   ED course: On arrival to the ED, patient's blood pressure was on the lower end of normal at 90/57 with heart rate of 63.  He was saturating at 99% on room air.  He was afebrile at 97.7.  Initial workup notable for hemoglobin of 12.0, platelets of 105, potassium 3.4, bicarb 30, creatinine 1.74 with GFR of 36, troponin of 59, and BNP 1813.  Urinalysis with hematuria with over 50 RBC/hpf and WBCs. Chest x-ray was obtained with minimal bibasilar atelectasis versus edema with minimal bilateral pleural effusions.  Patient started on Lasix and midodrine.  TRH contacted for admission.    Assessment and Plan:   Acute on chronic heart failure with preserved ejection fraction (HFpEF)  (HCC) Patient is presenting with gradually worsening lower extremity edema and weight gain that significantly worsened over the last 48 hours despite home diuresis with torsemide 40 mg twice daily.  Most recent echocardiogram in February 2024 demonstrating preserved EF at 55-60%, moderate LVH, mildly reduced RV systolic function and severely elevated PASP. Continue Lasix as much as blood pressure tolerates Continue midodrine 5 mg 3 times a day Cardiology consult    AKI (acute kidney injury) [facility]) Per chart review, patient's creatinine has been gradually rising since March 2024 with uncertain etiology.   During this time, his torsemide was increased however he has been having increased edema as well.   Potentially congestive in nature  Renal function shows some improvement. (1.7 >> 1.4)      Hypotension Chronic issue for which patient was previously on midodrine as needed.  Will restart at this time in the setting of IV diuresis.   - Midodrine 5 mg 3 times daily with meals   Rheumatic mitral regurgitation Most recent echocardiogram in February demonstrated moderate to severe MR.  Unlikely patient is a surgical candidate.    Longstanding persistent atrial fibrillation (HCC) Rate controlled at this time.   - Continue home metoprolol for rate control - Continue home Eliquis   Hematuria Hematuria noted on urinalysis today that was not present in March 2024.  Will need outpatient follow-up with urology if persistent.  Patient denies any urinary symptoms at this time   Thrombocytopenia (HCC) Mild thrombocytopenia dating back to October  2022.  Unclear etiology, however potentially just ITP.  No intervention indicated at this time.   Pulmonary embolism (HCC) - Continue home Eliquis   Macrocytic anemia Hemoglobin is stable at this time.  Will monitor while admitted.   Hypokalemia Secondary to diuretic use Supplement potassium      Subjective: Patient is seen and examined at  the bedside.  His son-in-law is at the bedside.  Feels better.  Continues to have lower extremity swelling  Physical Exam: Vitals:   12/01/22 1000 12/01/22 1100 12/01/22 1200 12/01/22 1400  BP: (!) 98/54 92/61 100/60 (!) 87/56  Pulse: 64 60 (!) 57 (!) 46  Resp: 15 16 20  (!) 22  Temp:      TempSrc:      SpO2: 92% 92% 95% 99%   Physical Exam Vitals and nursing note reviewed.  Constitutional:      Comments: Sleeping but arouses easily.  Very hard of hearing.  HENT:     Head: Normocephalic and atraumatic.     Nose: Nose normal.     Mouth/Throat:     Mouth: Mucous membranes are dry.  Eyes:     Conjunctiva/sclera: Conjunctivae normal.  Cardiovascular:     Rate and Rhythm: Rhythm irregular.  Pulmonary:     Effort: Pulmonary effort is normal.     Breath sounds: Normal breath sounds.  Abdominal:     General: Abdomen is flat. Bowel sounds are normal.     Palpations: Abdomen is soft.  Musculoskeletal:     Cervical back: Normal range of motion and neck supple.     Right lower leg: Edema present.     Left lower leg: Edema present.  Skin:    General: Skin is warm and dry.  Neurological:     Motor: Weakness present.  Psychiatric:        Mood and Affect: Mood normal.        Behavior: Behavior normal.     Data Reviewed: Relevant notes from primary care and specialist visits, past discharge summaries as available in EHR, including Care Everywhere. Prior diagnostic testing as pertinent to current admission diagnoses Updated medications and problem lists for reconciliation ED course, including vitals, labs, imaging, treatment and response to treatment Triage notes, nursing and pharmacy notes and ED provider's notes Notable results as noted in HPI Labs reviewed.  Sodium 140, potassium 3.3, chloride 102, bicarb 30, glucose 74, BUN 42, creatinine 1.41, calcium 7.7, hemoglobin 11.8, platelet count 86, There are no new results to review at this time.  Family Communication: Discussed  patient's condition and plan of care with his daughter Tanisha Edelman over the phone.  All questions and concerns have been addressed.  Disposition: Status is: Inpatient Remains inpatient appropriate because: On IV diuresis  Planned Discharge Destination: Home with Home Health    Time spent: 35 minutes  Author: Lucile Shutters, MD 12/01/2022 2:49 PM  For on call review [email or web address].

## 2022-12-01 NOTE — ED Notes (Signed)
Provider made aware of patient hypotension. Patient BP runs systolic 90s-106 per wife. Patient is currently taking midodrine. Patient receiving furosemide for swelling.

## 2022-12-01 NOTE — Evaluation (Signed)
Occupational Therapy Evaluation Patient Details Name: Marcus Kemp MRN: [id number] DOB: June 08, 1925 Today's Date: 12/01/2022   History of Present Illness Pt admitted for acute/chronic heart failure with preserved EF and complaints of B LE swelling/weakness. HIstory includes heart failure, pulm hyptension, HTN, Afib, renal cell carcinoma, and reheumatic fever.   Clinical Impression   Patient received for OT evaluation. See flowsheet below for details of function. Generally, patient requiring MOD(I) (extra time) for bed mobility, handheld assist x2 for sidesteps only, and set up for seated ADLs/ MOD A standing ADLs (pt requires assist for LB dressing at baseline). Patient will benefit from continued OT while in acute care.     Recommendations for follow up therapy are one component of a multi-disciplinary discharge planning process, led by the attending physician.  Recommendations may be updated based on patient status, additional functional criteria and insurance authorization.   Assistance Recommended at Discharge Frequent or constant Supervision/Assistance  Patient can return home with the following A little help with walking and/or transfers;A little help with bathing/dressing/bathroom;Assistance with cooking/housework;Direct supervision/assist for medications management;Direct supervision/assist for financial management;Assist for transportation;Help with stairs or ramp for entrance    Functional Status Assessment  Patient has had a recent decline in their functional status and demonstrates the ability to make significant improvements in function in a reasonable and predictable amount of time.  Equipment Recommendations  None recommended by OT    Recommendations for Other Services       Precautions / Restrictions Precautions Precautions: Fall Restrictions Weight Bearing Restrictions: No      Mobility Bed Mobility Overal bed mobility: Needs Assistance Bed Mobility: Supine  to Sit, Sit to Supine     Supine to sit: Min guard          Transfers Overall transfer level: Needs assistance Equipment used: 1 person hand held assist Transfers: Sit to/from Stand Sit to Stand: Min assist                  Balance Overall balance assessment: Needs assistance Sitting-balance support: Feet supported Sitting balance-Leahy Scale: Good     Standing balance support: Single extremity supported Standing balance-Leahy Scale: Poor Standing balance comment: pt reaching out for BIL UE support (son-in-law providing support for second hand); pt able to take a few sides steps to the L.                           ADL either performed or assessed with clinical judgement   ADL Overall ADL's : Needs assistance/impaired Eating/Feeding: Set up;Supervision/ safety;Sitting (seated EOB) Eating/Feeding Details (indicate cue type and reason): assist with opening some containers, assist with identifying which packet was creamer to go in his coffee. Pt eating with primarly his R hand for silverware; L hand for support.                                 Functional mobility during ADLs: Minimal assistance (handheld assist for steadying while standing; slightly off balance.) General ADL Comments: UE WFL today for eating breakfast and bed mobility.     Vision Baseline Vision/History:  (glasses for reading only) Ability to See in Adequate Light: 0 Adequate Patient Visual Report: No change from baseline       Perception     Praxis      Pertinent Vitals/Pain       Hand Dominance Right   Extremity/Trunk  Assessment Upper Extremity Assessment Upper Extremity Assessment: Overall WFL for tasks assessed (able to open food containers; use UE for bed mobility)   Lower Extremity Assessment Lower Extremity Assessment: Generalized weakness       Communication Communication Communication: HOH   Cognition Arousal/Alertness: Awake/alert Behavior During  Therapy: WFL for tasks assessed/performed Overall Cognitive Status: Within Functional Limits for tasks assessed                                 General Comments: difficult communication 2/2 HOH and no hearing aides     General Comments  O2 probe on nose today. BP in semi-reclined 94/57 O2 92. BP at edge of bed 83/61, O2 95. When he stood his O2 sensor came off, so couldn't check sats; stood approx 1 minute; 88% when he sat back down and O2 probe reattached; quickly recovered to 93%. Pt able to sit up on EOB x30 minutes today.    Exercises     Shoulder Instructions      Home Living Family/patient expects to be discharged to:: Private residence Living Arrangements: Spouse/significant other Available Help at Discharge: Family;Available 24 hours/day Type of Home: House Home Access: Stairs to enter [employer] of Steps: 2 steps into sunken den (railing available)   Home Layout: One level     Bathroom Shower/Tub: Producer, television/film/video: Standard     Home Equipment: Crutches;Cane - single point;Grab bars - tub/shower          Prior Functioning/Environment Prior Level of Function : Independent/Modified Independent             Mobility Comments: reports no recent falls and has been using SPC for mobility. ADLs Comments: Mod I for most ADLs. Wife assists as needed; pt states that wife assists with socks, shoes, ands sometimes pants/underwear. Retired from [employer].        OT Problem List: Decreased activity tolerance;Impaired balance (sitting and/or standing)      OT Treatment/Interventions: Self-care/ADL training;Therapeutic exercise;Energy conservation;Therapeutic activities    OT Goals(Current goals can be found in the care plan section) Acute Rehab OT Goals Patient Stated Goal: Get better OT Goal Formulation: With patient Time For Goal Achievement: 12/15/22 Potential to Achieve Goals: Good ADL Goals Pt Will Perform Grooming: with  modified independence;standing Pt Will Perform Lower Body Dressing: with min assist;sit to/from stand Pt Will Transfer to Toilet: with modified independence;regular height toilet;ambulating Pt Will Perform Toileting - Clothing Manipulation and hygiene: with modified independence;sit to/from stand  OT Frequency: Min 2X/week    Co-evaluation              AM-PAC OT "6 Clicks" Daily Activity     Outcome Measure Help from another person eating meals?: A Little Help from another person taking care of personal grooming?: A Little Help from another person toileting, which includes using toliet, bedpan, or urinal?: A Lot Help from another person bathing (including washing, rinsing, drying)?: A Lot Help from another person to put on and taking off regular upper body clothing?: None Help from another person to put on and taking off regular lower body clothing?: A Little 6 Click Score: 17   End of Session Equipment Utilized During Treatment: Other (comment) (oxygen monitor; pt on room air) Nurse Communication: Mobility status;Other (comment) (O2 sats and BP)  Activity Tolerance: Patient tolerated treatment well Patient left: in bed;with call bell/phone within reach;with bed alarm set  OT Visit  Diagnosis: Unsteadiness on feet (R26.81);Muscle weakness (generalized) (M62.81)                Time: 1914-7829 OT Time Calculation (min): 46 min Charges:  OT General Charges $OT Visit: 1 Visit OT Evaluation $OT Eval Moderate Complexity: 1 Mod OT Treatments $Self Care/Home Management : 23-37 mins  Linward Foster, MS, OTR/L  Alvester Morin 12/01/2022, 11:45 AM

## 2022-12-01 NOTE — ED Notes (Signed)
PT at bedside.

## 2022-12-02 DIAGNOSIS — I5033 Acute on chronic diastolic (congestive) heart failure: Secondary | ICD-10-CM | POA: Diagnosis not present

## 2022-12-02 LAB — BASIC METABOLIC PANEL
Anion gap: 6 (ref 5–15)
BUN: 47 mg/dL — ABNORMAL HIGH (ref 8–23)
CO2: 34 mmol/L — ABNORMAL HIGH (ref 22–32)
Calcium: 8.7 mg/dL — ABNORMAL LOW (ref 8.9–10.3)
Chloride: 95 mmol/L — ABNORMAL LOW (ref 98–111)
Creatinine, Ser: 1.7 mg/dL — ABNORMAL HIGH (ref 0.61–1.24)
GFR, Estimated: 37 mL/min — ABNORMAL LOW (ref >60)
Glucose, Bld: 85 mg/dL (ref 70–99)
Potassium: 4.3 mmol/L (ref 3.5–5.1)
Sodium: 135 mmol/L (ref 135–145)

## 2022-12-02 MED ORDER — DILTIAZEM HCL 25 MG/5ML IV SOLN
INTRAVENOUS | Status: AC
  Filled 2022-12-02: qty 5

## 2022-12-02 MED ORDER — ORAL CARE MOUTH RINSE: 15.0000 mL | OROMUCOSAL | Status: DC | PRN

## 2022-12-02 NOTE — Progress Notes (Addendum)
Progress Note   Patient: Marcus Kemp UXL:[id number] DOB: 11/10/1924 DOA: 11/30/2022     2 DOS: the patient was seen and examined on 12/02/2022   Brief hospital course: Marcus Kemp is a 89 y.o. 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  He notes that he has been experiencing increasing lower extremity swelling but is unclear for how long it has been going on.  He denies any chest pain or shortness of breath at this time.  He notes that he is been having trouble walking due to the heaviness of his legs.  Per chart review, family previously at bedside stated swelling in legs has been occurring for the last 48 hours despite diuresis with home torsemide.  In addition, his weight increased from 135 to 142 in this time.  Family noted that he was having increased generalized weakness and was having dyspnea on exertion.  They had noted a nonproductive cough but no shortness of breath at rest, fevers, chills, chest pain or palpitations.   ED course: On arrival to the ED, patient's blood pressure was on the lower end of normal at 90/57 with heart rate of 63.  He was saturating at 99% on room air.  He was afebrile at 97.7.  Initial workup notable for hemoglobin of 12.0, platelets of 105, potassium 3.4, bicarb 30, creatinine 1.74 with GFR of 36, troponin of 59, and BNP 1813.  Urinalysis with hematuria with over 50 RBC/hpf and WBCs. Chest x-ray was obtained with minimal bibasilar atelectasis versus edema with minimal bilateral pleural effusions.  Patient started on Lasix and midodrine.  TRH contacted for admission.   Assessment and Plan:  Acute on chronic heart failure with preserved ejection fraction (HFpEF)  (HCC) Patient presented with gradually worsening lower extremity edema and weight gain that significantly worsened over the last 48 hours despite home diuresis with torsemide 40 mg twice daily.  Most recent echocardiogram in February 2024 demonstrating preserved EF at 55-60%, moderate LVH, mildly reduced RV systolic function and severely elevated PASP. Continue Lasix drip and monitor renal function closely Continue midodrine 5 mg 3 times a day Appreciate Cardiology input.      AKI (acute kidney injury) (HCC) superimposed on stage IIIa chronic kidney disease Per chart review, patient's creatinine has been gradually rising since March 2024 with uncertain etiology.   During this time, his torsemide was increased however he has been having increased edema as well.   Potentially congestive in nature  Monitor renal function closely while on diuretic therapy       Hypotension Chronic issue for which patient was previously on midodrine as needed.  Will restart at this time in the setting of IV diuresis.   - Midodrine 5 mg 3 times daily with meals    Rheumatic mitral regurgitation Most recent echocardiogram in February demonstrated moderate to severe MR.  Unlikely patient is a surgical candidate.     Longstanding persistent atrial fibrillation (HCC) Rate controlled at this time. - Continue home metoprolol for rate control - Continue home Eliquis   Hematuria Hematuria noted on urinalysis today that was not present in March 2024.  Will need outpatient follow-up with urology if persistent.  Patient denies any urinary symptoms at this time   Thrombocytopenia (  HCC) Mild thrombocytopenia dating back to October 2022.  Unclear etiology, however potentially just ITP.  No intervention indicated at this time.   Pulmonary embolism (HCC) - Continue home Eliquis   Macrocytic anemia Hemoglobin is stable at this time.  Will monitor while admitted.     Hypokalemia Secondary to diuretic  use Supplement potassium           Subjective: Patient is seen and examined at the bedside.  Wife is also at his bedside.  Has no new complaints  Physical Exam: Vitals:   12/02/22 0402 12/02/22 0500 12/02/22 0811 12/02/22 1202  BP:   96/62 102/61  Pulse: (!) 55  64 (!) 59  Resp:   14 16  Temp:   97.9 F (36.6 C) 97.8 F (36.6 C)  TempSrc:      SpO2:   100% 100%  Weight:  64.3 kg     Vitals and nursing note reviewed.  Constitutional:      Comments: Sleeping but arouses easily.  Very hard of hearing.  HENT:     Head: Normocephalic and atraumatic.     Nose: Nose normal.     Mouth/Throat:     Mouth: Mucous membranes are dry.  Eyes:     Conjunctiva/sclera: Conjunctivae normal.  Cardiovascular:     Rate and Rhythm: Rhythm irregular.  Pulmonary:     Effort: Pulmonary effort is normal.     Breath sounds: Normal breath sounds.  Abdominal:     General: Abdomen is flat. Bowel sounds are normal.     Palpations: Abdomen is soft.  Musculoskeletal:     Cervical back: Normal range of motion and neck supple.     Right lower leg: Edema present.     Left lower leg: Edema present.  Skin:    General: Skin is warm and dry.  Neurological:     Motor: Weakness present.  Psychiatric:        Mood and Affect: Mood normal.        Behavior: Behavior normal.   Data Reviewed: Relevant notes from primary care and specialist visits, past discharge summaries as available in EHR, including Care Everywhere. Prior diagnostic testing as pertinent to current admission diagnoses Updated medications and problem lists for reconciliation ED course, including vitals, labs, imaging, treatment and response to treatment Triage notes, nursing and pharmacy notes and ED provider's notes Notable results as noted in HPI Labs reviewed.  Noted to have a bump in his serum creatinine from 1.4-1.7 There are no new results to review at this time.  Family Communication: Discussed plan of care with patient and  his wife at the bedside.  They verbalized understanding and agree with the plan.  Disposition: Status is: Inpatient Remains inpatient appropriate because: Continues to require IV diuretics for acute diastolic dysfunction CHF  Planned Discharge Destination: Home with Home Health    Time spent: 35 minutes  Author: Lucile Shutters, MD 12/02/2022 2:37 PM  For on call review [email or web address].

## 2022-12-02 NOTE — Progress Notes (Signed)
[facility] CLINIC CARDIOLOGY CONSULT NOTE       Patient ID: Marcus Kemp MRN: [id number] DOB/AGE: 1924-09-15 89 y.o.  Admit date: 11/30/2022 Referring Physician Dr. Joylene Igo Primary Physician Dr. Judithann Sheen  Primary Cardiologist Dr. Darrold Junker Reason for Consultation acute on chronic HF   HPI: Marcus Kemp. 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  Cardiology is consulted for further assistance.  Interval history: -Feeling better today with improved dyspnea and peripheral edema -Ambulated with a walker with occupational therapy -Continues to diurese well on a Lasix drip with slight bump in creatinine overnight  Review of systems complete and found to be negative unless listed above     Past Medical History:  Diagnosis Date   A-fib (HCC)    a.) CHA2DS2-VASc = 4 (age x 2, CHF, aortic plaque). b.) s/p unsuccessful TEE cardioversion 04/28/2016 --> rec'd 200 J x1 and 360 J x 2. c.) rate/rhythm maintained on oral metoprolol succinante; chronically anticoagulated using dose reduced apixaban.   Anemia    Aortic atherosclerosis (HCC)    Arthritis    CHF (congestive heart failure) (HCC)    a.) TTE 04/26/2016: EF >55%; mod LVH; MVP; mild-mod LAE, mild MR. b.) TTE 05/14/209: EF >55%; mod RVE; mild BAE; mod MR.TR; triv AR/PR. c.) TTE 03/28/2019: EF >55%; mild LVH; mod BAE; mild PR, mod MR/TR.   GERD (gastroesophageal reflux disease)    Heart murmur    History of kidney stones    HOH (hard of hearing)    a.) hearing aid to LEFT ear.   Hyperlipidemia    Hypertension    Infrarenal abdominal aortic aneurysm (AAA) without rupture (HCC) 05/05/2021   a.) CT 05/05/2021 --> measured 3.0 cm.   MVP (mitral valve prolapse)     Peripheral edema    Pneumonia    Pulmonary embolus (HCC)    Pulmonary HTN (HCC) 04/26/2016   a.) TTE 04/26/2016: EF >55%; PASP 45. b.) TTE 12/06/2017: EF >55%; RVSP 76.6 mmHg. c.) TTE 03/28/2019: EF >55%; RVSP 77.8 mmHg.   Renal cell carcinoma (HCC)    Rheumatic fever    Sepsis (HCC)    Valvular heart disease     Past Surgical History:  Procedure Laterality Date   INGUINAL HERNIA REPAIR Left 01/13/2022   Procedure: HERNIA REPAIR INGUINAL ADULT;  Surgeon: Earline Mayotte, MD;  Location: [facility] ORS;  Service: General;  Laterality: Left;   INSERTION OF MESH  01/13/2022   Procedure: INSERTION OF MESH;  Surgeon: Earline Mayotte, MD;  Location: [facility] ORS;  Service: General;;   LITHOTRIPSY     TEE WITH CARDIOVERSION N/A    P   TONSILLECTOMY      Medications Prior to Admission  Medication Sig Dispense Refill Last Dose   apixaban (ELIQUIS) 5 MG TABS tablet Take 5 mg by mouth 2 (two) times daily.   11/30/2022 at 0930   Azelastine HCl 137 MCG/SPRAY SOLN Place 1 spray into the nose 2 (two) times daily.   11/30/2022   cetirizine (ZYRTEC) 10 MG tablet SMARTSIG:1 pill By Mouth Daily PRN   unknown   empagliflozin (JARDIANCE) 10 MG TABS tablet Take 1 tablet (10 mg total) by mouth daily. 30 tablet 5 11/30/2022   fluorouracil (EFUDEX) 5 % cream Apply 1  Application topically 2 (two) times daily.   11/29/2022   metoprolol succinate (TOPROL-XL) 25 MG 24 hr tablet Take 1 tablet (25 mg total) by mouth every morning. 30 tablet 5 11/30/2022   midodrine (PROAMATINE) 5 MG tablet Take 5 mg by mouth as needed. For SBP >100   unknown   rOPINIRole (REQUIP) 1 MG tablet Take 1 mg by mouth at bedtime.   11/29/2022   torsemide (DEMADEX) 20 MG tablet 40mg  in the morning and 20mg  in the evening 90 tablet 3 11/30/2022   traZODone (DESYREL) 50 MG tablet Take 50 mg by mouth at bedtime.   11/29/2022   vitamin B-12 (CYANOCOBALAMIN) 500 MCG tablet Take 1 tablet (500 mcg total) by mouth daily. 90 tablet 3 11/30/2022    Social History    Socioeconomic History   Marital status: Married    Spouse name: Not on file   Number of children: Not on file   Years of education: Not on file   Highest education level: Not on file  Occupational History   Not on file  Tobacco Use   Smoking status: Former    Packs/day: 0.25    Years: 8.00    Additional pack years: 0.00    Total pack years: 2.00    Types: Cigarettes    Quit date: 55    Years since quitting: 84.4   Smokeless tobacco: Never  Vaping Use   Vaping Use: Never used  Substance and Sexual Activity   Alcohol use: No   Drug use: Never   Sexual activity: Not on file  Other Topics Concern   Not on file  Social History Narrative   Not on file   Social Determinants of Health   Financial Resource Strain: Not on file  Food Insecurity: No Food Insecurity (12/01/2022)   Hunger Vital Sign    Worried About Running Out of Food in the Last Year: Never true    Ran Out of Food in the Last Year: Never true  Transportation Needs: No Transportation Needs (12/01/2022)   PRAPARE - Administrator, Civil Service (Medical): No    Lack of Transportation (Non-Medical): No  Physical Activity: Not on file  Stress: Not on file  Social Connections: Not on file  Intimate Partner Violence: Not At Risk (12/01/2022)   Humiliation, Afraid, Rape, and Kick questionnaire    Fear of Current or Ex-Partner: No    Emotionally Abused: No    Physically Abused: No    Sexually Abused: No    Family History  Problem Relation Age of Onset   Heart disease Sister    Hypertension Other       Intake/Output Summary (Last 24 hours) at 12/02/2022 1423 Last data filed at 12/02/2022 1203 Gross per 24 hour  Intake 308.11 ml  Output 700 ml  Net -391.89 ml    Vitals:   12/02/22 0402 12/02/22 0500 12/02/22 0811 12/02/22 1202  BP:   96/62 102/61  Pulse: (!) 55  64 (!) 59  Resp:   14 16  Temp:   97.9 F (36.6 C) 97.8 F (36.6 C)  TempSrc:      SpO2:   100% 100%  Weight:  64.3 kg       PHYSICAL EXAM General: Elderly and chronically ill-appearing thin Caucasian male.  Sitting upright in bed with wife, OT, nursing at bedside  HEENT:  Normocephalic and atraumatic.  Hard of hearing. Neck:  No JVD.  Lungs: Normal respiratory effort on room air.  Decreased  breath sounds without appreciable crackles. Heart: HRRR . Normal S1 and S2, 3/6 blowing holosystolic murmur best heard at the apex. Abdomen: Nontender to palpation, distended. Msk: Generalized weakness. Extremities: Chronic hyperpigmentation and woody edema, multiple excoriations and wounds with 1+ pitting bilaterally with overall improvement. Neuro: Awake and alert  psych:  Answers questions appropriately.   Labs: Basic Metabolic Panel: Recent Labs    12/01/22 0522 12/02/22 0311  NA 140 135  K 3.3* 4.3  CL 102 95*  CO2 30 34*  GLUCOSE 74 85  BUN 42* 47*  CREATININE 1.41* 1.70*  CALCIUM 7.7* 8.7*  MG 2.3  --     Liver Function Tests: No results for input(s): "AST", "ALT", "ALKPHOS", "BILITOT", "PROT", "ALBUMIN" in the last 72 hours. No results for input(s): "LIPASE", "AMYLASE" in the last 72 hours. CBC: Recent Labs    11/30/22 1051 12/01/22 0522  WBC 5.0 4.9  NEUTROABS  --  2.9  HGB 12.0* 11.8*  HCT 37.0* 35.8*  MCV 104.8* 103.5*  PLT 105* 86*    Cardiac Enzymes: Recent Labs    11/30/22 1051  TROPONINIHS 59*    BNP: Recent Labs    11/30/22 1051  BNP 1,813.4*    D-Dimer: No results for input(s): "DDIMER" in the last 72 hours. Hemoglobin A1C: No results for input(s): "HGBA1C" in the last 72 hours. Fasting Lipid Panel: No results for input(s): "CHOL", "HDL", "LDLCALC", "TRIG", "CHOLHDL", "LDLDIRECT" in the last 72 hours. Thyroid Function Tests: No results for input(s): "TSH", "T4TOTAL", "T3FREE", "THYROIDAB" in the last 72 hours.  Invalid input(s): "FREET3" Anemia Panel: No results for input(s): "VITAMINB12", "FOLATE", "FERRITIN", "TIBC", "IRON", "RETICCTPCT" in the last 72  hours.   Radiology: US RENAL  Result Date: 11/30/2022 CLINICAL DATA:  Acute kidney injury EXAM: RENAL / URINARY TRACT ULTRASOUND COMPLETE COMPARISON:  Renal ultrasound 09/09/2022.  CT abdomen 12/24/2021 FINDINGS: Right Kidney: Renal measurements: 10.1 x 4.8 x 4.5 cm = volume: 115 mL. No hydronephrosis. Increased echogenicity is seen throughout. There is a 1.7 x 1.4 x 1.8 cm cyst in the upper pole. Left Kidney: Renal measurements: 10.3 x 4.9 x 4.1 cm = volume: 107 mL. No hydronephrosis. Normal echogenicity. Are 2 cysts identified in the upper pole measuring 2.1 x 1.0 x 2.0 cm and 1.1 x 1.0 x 1.2 cm. Bladder: Appears normal for degree of bladder distention. Other: There is trace free fluid around the liver and spleen. IMPRESSION: 1. No hydronephrosis. 2. Increased echogenicity in the right kidney is nonspecific but can be seen in the setting of medical renal disease. 3. Bilateral renal cysts. 4. Trace free fluid around the liver and spleen. Electronically Signed   By: Darliss Cheney M.D.   On: 11/30/2022 17:16   DG Chest 2 View  Result Date: 11/30/2022 CLINICAL DATA:  Shortness of breath. EXAM: CHEST - 2 VIEW COMPARISON:  October 31, 2022. FINDINGS: Stable cardiomegaly. Minimal bibasilar subsegmental atelectasis or edema is noted with minimal pleural effusions. Bony thorax is unremarkable. IMPRESSION: Minimal bibasilar subsegmental atelectasis or edema is noted with minimal bilateral pleural effusions. Aortic Atherosclerosis (ICD10-I70.0). Electronically Signed   By: Lupita Raider M.D.   On: 11/30/2022 11:46    ECHO 08/2022 1. Left ventricular ejection fraction, by estimation, is 55 to 60%. The  left ventricle has normal function. The left ventricle has no regional  wall motion abnormalities. There is moderate left ventricular hypertrophy.  Left ventricular diastolic  parameters are indeterminate. There is the interventricular septum is  flattened in systole and  diastole, consistent with right ventricular   pressure and volume overload.   2. Right ventricular systolic function is mildly reduced. The right  ventricular size is moderately enlarged. There is severely elevated  pulmonary artery systolic pressure. The estimated right ventricular  systolic pressure is 70.4 mmHg.   3. Left atrial size was moderately dilated.   4. Right atrial size was mildly dilated.   5. The mitral valve is normal in structure. Moderate to severe mitral  valve regurgitation. No evidence of mitral stenosis.   6. Tricuspid valve regurgitation is moderate to severe.   7. The aortic valve has an indeterminant number of cusps. Aortic valve  regurgitation is not visualized. Aortic valve sclerosis/calcification is  present, without any evidence of aortic stenosis.   8. There is borderline dilatation of the ascending aorta, measuring 36  mm.   9. The inferior vena cava is dilated in size with <50% respiratory  variability, suggesting right atrial pressure of 15 mmHg.   TELEMETRY reviewed by me (LT) 12/02/2022 : Sinus rhythm rate 60s  EKG reviewed by me: Sinus rhythm first-degree AV block nonspecific T wave abnormality  Data reviewed by me (LT) 12/02/2022: Last outpatient cardiology note, ED note, admission H&P last 24h vitals tele labs imaging I/O   Principal Problem:   Acute on chronic heart failure with preserved ejection fraction (HFpEF) (HCC) Active Problems:   Longstanding persistent atrial fibrillation (HCC)   Rheumatic mitral regurgitation   Macrocytic anemia   Pulmonary embolism (HCC)   Thrombocytopenia (HCC)   Hematuria   AKI (acute kidney injury) (HCC)   Hypotension   Hypokalemia    ASSESSMENT AND PLAN:  Marcus Karvonen. 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  Cardiology is consulted for further assistance.  # Acute on chronic HFpEF # Severe rheumatic MR Presents with significant peripheral edema extending from his feet up into his groin and abdomen despite adjustment of his torsemide on an outpatient basis, limiting his ability to ambulate due to the weight of his legs. Dietary indiscretions with potato chips likely contributory, but family does their best to ensure that the patient appears to his salt and fluid restricted diet.  On admission BNP elevated to 1800 and chest x-ray with pleural effusions.  He is clinically hypervolemic on exam today, although with clinical improvement after initial diuresis. -S/p IV Lasix 40 mg x 1 -Continue Lasix drip at but decrease dose from 8 mg/h to 4 mg/h as BP is marginal -Hold metoprolol for now with borderline blood pressures -Additional GDMT limited by hypotension and renal insufficiency -Strict salt and fluid restriction, strict I's/O -Defer additional cardiac diagnostics at this time. -The patient would benefit from palliative care evaluation/goals of care discussions with the patient's daughter present (as per her request) with repeat hospitalizations and advanced age.  # Paroxysmal atrial fibrillation In sinus rhythm on telemetry.  Continue Eliquis 5 mg twice daily for stroke prevention, and with history of recent pulmonary embolus in February 2024.  Note for signs/symptoms of bleeding, as he has a history of complications with full dose Eliquis  # AKI Renal function fluctuating with diuresis from creatinine 1.7--1.4--1.7 today, current GFR 37.  Monitor closely.  # Demand ischemia Borderline elevated at 59, which in the absence  of chest pain or EKG changes in the setting of acute on chronic HFpEF as above, this is most consistent with demand/supply mismatch and not ACS   This patient's plan of care was discussed and created with Dr. Darrold Junker and he is in  agreement.  Signed: Rebeca Allegra , PA-C 12/02/2022, 2:23 PM [facility] Cardiology

## 2022-12-02 NOTE — Progress Notes (Signed)
Occupational Therapy Treatment Patient Details Name: Marcus Kemp MRN: [id number] DOB: 06/28/1925 Today's Date: 12/02/2022   History of present illness Pt admitted for acute/chronic heart failure with preserved EF and complaints of B LE swelling/weakness. HIstory includes heart failure, pulm hyptension, HTN, Afib, renal cell carcinoma, and reheumatic fever.   OT comments  Pt received semi-reclined in bed. Appearing alert; wife in room; willing to work with OT on functional mobility and toielting. T/f CGA with RW and gait belt. See flowsheet below for further details of session. Left semi-reclined in bed with all needs in reach.  Patient will benefit from continued OT while in acute care.    Recommendations for follow up therapy are one component of a multi-disciplinary discharge planning process, led by the attending physician.  Recommendations may be updated based on patient status, additional functional criteria and insurance authorization.    Assistance Recommended at Discharge Frequent or constant Supervision/Assistance  Patient can return home with the following  A little help with walking and/or transfers;A little help with bathing/dressing/bathroom;Assistance with cooking/housework;Direct supervision/assist for medications management;Direct supervision/assist for financial management;Assist for transportation;Help with stairs or ramp for entrance   Equipment Recommendations  None recommended by OT    Recommendations for Other Services      Precautions / Restrictions Precautions Precautions: Fall Restrictions Weight Bearing Restrictions: No       Mobility Bed Mobility Overal bed mobility: Needs Assistance Bed Mobility: Supine to Sit, Sit to Supine     Supine to sit: Supervision (and cues) Sit to supine: Supervision (and cues)        Transfers Overall transfer level: Needs assistance Equipment used: Rolling walker (2 wheels) Transfers: Sit to/from Stand Sit to  Stand: Min guard           General transfer comment: Cues for hand placement. Gait belt used for safety.     Balance Overall balance assessment: Needs assistance Sitting-balance support: Feet supported Sitting balance-Leahy Scale: Good     Standing balance support: Bilateral upper extremity supported, Reliant on assistive device for balance Standing balance-Leahy Scale: Fair                             ADL either performed or assessed with clinical judgement   ADL Overall ADL's : Needs assistance/impaired     Grooming: Wash/dry hands;Min guard;Supervision/safety;Standing Grooming Details (indicate cue type and reason): stood at sink for hand hygiene after using toilet; one instance of loss of balance when pt had both hands off RW and was turning to the L to get paper towel; self-corrected.         Upper Body Dressing : Moderate assistance;Sitting Upper Body Dressing Details (indicate cue type and reason): OT assisted pt to change shirt- doffed old shirt and donned new short sleeve and then long sleeve; pt assisting, but holding arms up for OT to help him.     Toilet Transfer: Min guard;Ambulation;BSC/3in1;Rolling walker (2 wheels) (BSC over toilet) Toilet Transfer Details (indicate cue type and reason): Cues to use grab bar and BSC arm rest Toileting- Clothing Manipulation and Hygiene: Set up;Supervision/safety;Sit to/from stand Toileting - Clothing Manipulation Details (indicate cue type and reason): Pt seated for bowel hygiene       General ADL Comments: OT assisting pt with managing purewick (while unplugged) for urinating into a container while seated on commode today.    Extremity/Trunk Assessment Upper Extremity Assessment Upper Extremity Assessment: Overall WFL for tasks assessed  Lower Extremity Assessment Lower Extremity Assessment: Defer to PT evaluation        Vision       Perception     Praxis      Cognition Arousal/Alertness:  Awake/alert Behavior During Therapy: WFL for tasks assessed/performed Overall Cognitive Status: Within Functional Limits for tasks assessed                                 General Comments: very HOH        Exercises      Shoulder Instructions       General Comments Pt able to walk approx 100 feet into hallway and then back to room and into bathroom for toileting; RW CGA with gait belt, no overt loss of balance during mobility in hallway; pt stating "I go slowly so I don't bother my knees."    Pertinent Vitals/ Pain       Pain Assessment Pain Assessment: No/denies pain  Home Living                                          Prior Functioning/Environment              Frequency  Min 2X/week        Progress Toward Goals  OT Goals(current goals can now be found in the care plan section)  Progress towards OT goals: Progressing toward goals  Acute Rehab OT Goals Patient Stated Goal: Get better; go home OT Goal Formulation: With patient Time For Goal Achievement: 12/15/22 Potential to Achieve Goals: Good ADL Goals Pt Will Perform Grooming: with modified independence;standing Pt Will Perform Lower Body Dressing: with min assist;sit to/from stand Pt Will Transfer to Toilet: with modified independence;regular height toilet;ambulating Pt Will Perform Toileting - Clothing Manipulation and hygiene: with modified independence;sit to/from stand  Plan Discharge plan remains appropriate    Co-evaluation                 AM-PAC OT "6 Clicks" Daily Activity     Outcome Measure   Help from another person eating meals?: None Help from another person taking care of personal grooming?: A Little Help from another person toileting, which includes using toliet, bedpan, or urinal?: A Little Help from another person bathing (including washing, rinsing, drying)?: A Little Help from another person to put on and taking off regular upper body  clothing?: A Little Help from another person to put on and taking off regular lower body clothing?: A Little 6 Click Score: 19    End of Session Equipment Utilized During Treatment: Gait belt;Rolling walker (2 wheels)  OT Visit Diagnosis: Unsteadiness on feet (R26.81);Muscle weakness (generalized) (M62.81)   Activity Tolerance Patient tolerated treatment well   Patient Left in bed;with call bell/phone within reach;with bed alarm set;with family/visitor present;with nursing/sitter in room (wief at bedside, nurse, PA, and NT all in room when OT left)   Nurse Communication Mobility status;Other (comment) (need for attention to L forearm IV that appeared to be bleeding)        Time: 3086-5784 OT Time Calculation (min): 48 min  Charges: OT General Charges $OT Visit: 1 Visit OT Treatments $Self Care/Home Management : 23-37 mins $Therapeutic Activity: 8-22 mins  Linward Foster, MS, OTR/L   Alvester Morin 12/02/2022, 1:28 PM

## 2022-12-02 NOTE — Care Management Important Message (Signed)
Important Message  Patient Details  Name: Marcus Kemp MRN: [id number] Date of Birth: 07-02-1925   Medicare Important Message Given:  N/A - LOS <3 / Initial given by admissions     [name] 12/02/2022, 12:27 PM

## 2022-12-02 NOTE — Progress Notes (Signed)
PT Cancellation Note  Patient Details Name: Marcus Kemp MRN: [id number] DOB: [birth year]   Cancelled Treatment:     Therapist in to see pt who had just returned to bed after sitting up and walking in hall with OT. Pt fatigued and sleeping heavily, wife at bedside. Will try to return later in day if time permits.    [name] 12/02/2022, 3:35 PM

## 2022-12-02 NOTE — Plan of Care (Signed)

## 2022-12-02 NOTE — Progress Notes (Signed)
   Heart Failure Nurse Navigator Note    Met with patient and his wife Kathie Rhodes. Patient slept through most of interview.  Spoke with Kathie Rhodes about how patient takes care of himself at home.  She states he does daily weights.  At one time he recorded the amount of fluid he was taking in daily, but has gotten away from that.  She states that he will get up in the night and get something to eat or drink.  She feels most of the time that it is something salty that he is eating and drinks tomato juice for leg cramps.  She has decided that she needs to remove the foods that are higher in sodium from the home.  She states he is good about weighing himself daily. Discussed parameters that when he needs to report weight gain or changes in symptoms.  She voices understanding.  He has follow up in the outpatient heart failure clinic on May 21.  Tresa Endo RN

## 2022-12-03 DIAGNOSIS — I5033 Acute on chronic diastolic (congestive) heart failure: Secondary | ICD-10-CM | POA: Diagnosis not present

## 2022-12-03 LAB — BASIC METABOLIC PANEL
Anion gap: 10 (ref 5–15)
BUN: 44 mg/dL — ABNORMAL HIGH (ref 8–23)
CO2: 28 mmol/L (ref 22–32)
Calcium: 8.3 mg/dL — ABNORMAL LOW (ref 8.9–10.3)
Chloride: 97 mmol/L — ABNORMAL LOW (ref 98–111)
Creatinine, Ser: 1.56 mg/dL — ABNORMAL HIGH (ref 0.61–1.24)
GFR, Estimated: 41 mL/min — ABNORMAL LOW (ref >60)
Glucose, Bld: 80 mg/dL (ref 70–99)
Potassium: 3.7 mmol/L (ref 3.5–5.1)
Sodium: 135 mmol/L (ref 135–145)

## 2022-12-03 MED ORDER — POTASSIUM CHLORIDE 20 MEQ PO PACK
40.0000 meq | PACK | Freq: Once | ORAL | Status: AC
  Administered 2022-12-03: 40 meq via ORAL
  Filled 2022-12-03: qty 2

## 2022-12-03 NOTE — TOC Progression Note (Signed)
Transition of Care [facility]) - Progression Note    Patient Details  Name: PARTICK FIOLA MRN: [id number] Date of Birth: September 03, 1924  Transition of Care [facility]) CM/SW Contact  Truddie Hidden, RN Phone Number: 12/03/2022, 3:56 PM  Clinical Narrative:    Attempt to speak with patient regarding HH.  Patient sleeping.    Expected Discharge Plan: Home w Home Health Services Barriers to Discharge: Continued Medical Work up  Expected Discharge Plan and Services     Post Acute Care Choice:  (TBD) Living arrangements for the past 2 months: Single Family Home                                       Social Determinants of Health (SDOH) Interventions SDOH Screenings   Food Insecurity: No Food Insecurity (12/01/2022)  Housing: Low Risk  (12/01/2022)  Transportation Needs: No Transportation Needs (12/01/2022)  Utilities: Not At Risk (12/01/2022)  Tobacco Use: Medium Risk (12/01/2022)    Readmission Risk Interventions     No data to display

## 2022-12-03 NOTE — Progress Notes (Signed)
   Heart Failure Nurse Navigator Note  Met with patient and wife today.  Discussed again the options of what can be used to season foods  Explained the reasoning behind using Mrs. Dash over salt subs that contain potassium chloride.  She voices understanding.  She had another question but could not think of it at this time.  Told her I would gladly come back at anytime.  Tresa Endo RN

## 2022-12-03 NOTE — Progress Notes (Signed)
[facility] CLINIC CARDIOLOGY CONSULT NOTE       Patient ID: Marcus Kemp MRN: [id number] DOB/AGE: August 17, 1924 89 y.o.  Admit date: 11/30/2022 Referring Physician Dr. Joylene Igo Primary Physician Dr. Judithann Sheen  Primary Cardiologist Dr. 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  Cardiology is consulted for further assistance.  Interval history: -Seen and examined with wife at bedside -Feeling a little more fatigued today -Continues to diurese well -Desatted to 88% on room air with ambulation so now he is on 2L by [place] -Renal function stable, blood pressure soft while on a Lasix infusion  Review of systems complete and found to be negative unless listed above     Past Medical History:  Diagnosis Date   A-fib (HCC)    a.) CHA2DS2-VASc = 4 (age x 2, CHF, aortic plaque). b.) s/p unsuccessful TEE cardioversion 04/28/2016 --> rec'd 200 J x1 and 360 J x 2. c.) rate/rhythm maintained on oral metoprolol succinante; chronically anticoagulated using dose reduced apixaban.   Anemia    Aortic atherosclerosis (HCC)    Arthritis    CHF (congestive heart failure) (HCC)    a.) TTE 04/26/2016: EF >55%; mod LVH; MVP; mild-mod LAE, mild MR. b.) TTE 05/14/209: EF >55%; mod RVE; mild BAE; mod MR.TR; triv AR/PR. c.) TTE 03/28/2019: EF >55%; mild LVH; mod BAE; mild PR, mod MR/TR.   GERD (gastroesophageal reflux disease)    Heart murmur    History of kidney stones    HOH (hard of hearing)    a.) hearing aid to LEFT ear.   Hyperlipidemia    Hypertension    Infrarenal abdominal aortic aneurysm (AAA) without rupture (HCC) 05/05/2021   a.) CT 05/05/2021 --> measured  3.0 cm.   MVP (mitral valve prolapse)    Peripheral edema    Pneumonia    Pulmonary embolus (HCC)    Pulmonary HTN (HCC) 04/26/2016   a.) TTE 04/26/2016: EF >55%; PASP 45. b.) TTE 12/06/2017: EF >55%; RVSP 76.6 mmHg. c.) TTE 03/28/2019: EF >55%; RVSP 77.8 mmHg.   Renal cell carcinoma (HCC)    Rheumatic fever    Sepsis (HCC)    Valvular heart disease     Past Surgical History:  Procedure Laterality Date   INGUINAL HERNIA REPAIR Left 01/13/2022   Procedure: HERNIA REPAIR INGUINAL ADULT;  Surgeon: Earline Mayotte, MD;  Location: [facility] ORS;  Service: General;  Laterality: Left;   INSERTION OF MESH  01/13/2022   Procedure: INSERTION OF MESH;  Surgeon: Earline Mayotte, MD;  Location: [facility] ORS;  Service: General;;   LITHOTRIPSY     TEE WITH CARDIOVERSION N/A    P   TONSILLECTOMY      Medications Prior to Admission  Medication Sig Dispense Refill Last Dose   apixaban (ELIQUIS) 5 MG TABS tablet Take 5 mg by mouth 2 (two) times daily.   11/30/2022 at 0930   Azelastine HCl 137 MCG/SPRAY SOLN Place 1 spray into the nose 2 (two) times daily.   11/30/2022   cetirizine (ZYRTEC) 10 MG tablet SMARTSIG:1 pill By Mouth Daily PRN   unknown   empagliflozin (JARDIANCE) 10 MG TABS tablet Take 1 tablet (10 mg total) by mouth  daily. 30 tablet 5 11/30/2022   fluorouracil (EFUDEX) 5 % cream Apply 1 Application topically 2 (two) times daily.   11/29/2022   metoprolol succinate (TOPROL-XL) 25 MG 24 hr tablet Take 1 tablet (25 mg total) by mouth every morning. 30 tablet 5 11/30/2022   midodrine (PROAMATINE) 5 MG tablet Take 5 mg by mouth as needed. For SBP >100   unknown   rOPINIRole (REQUIP) 1 MG tablet Take 1 mg by mouth at bedtime.   11/29/2022   torsemide (DEMADEX) 20 MG tablet 40mg  in the morning and 20mg  in the evening 90 tablet 3 11/30/2022   traZODone (DESYREL) 50 MG tablet Take 50 mg by mouth at bedtime.   11/29/2022   vitamin B-12 (CYANOCOBALAMIN) 500 MCG tablet Take 1 tablet (500 mcg total) by mouth daily. 90  tablet 3 11/30/2022    Social History   Socioeconomic History   Marital status: Married    Spouse name: Not on file   Number of children: Not on file   Years of education: Not on file   Highest education level: Not on file  Occupational History   Not on file  Tobacco Use   Smoking status: Former    Packs/day: 0.25    Years: 8.00    Additional pack years: 0.00    Total pack years: 2.00    Types: Cigarettes    Quit date: 47    Years since quitting: 84.4   Smokeless tobacco: Never  Vaping Use   Vaping Use: Never used  Substance and Sexual Activity   Alcohol use: No   Drug use: Never   Sexual activity: Not on file  Other Topics Concern   Not on file  Social History Narrative   Not on file   Social Determinants of Health   Financial Resource Strain: Not on file  Food Insecurity: No Food Insecurity (12/01/2022)   Hunger Vital Sign    Worried About Running Out of Food in the Last Year: Never true    Ran Out of Food in the Last Year: Never true  Transportation Needs: No Transportation Needs (12/01/2022)   PRAPARE - Administrator, Civil Service (Medical): No    Lack of Transportation (Non-Medical): No  Physical Activity: Not on file  Stress: Not on file  Social Connections: Not on file  Intimate Partner Violence: Not At Risk (12/01/2022)   Humiliation, Afraid, Rape, and Kick questionnaire    Fear of Current or Ex-Partner: No    Emotionally Abused: No    Physically Abused: No    Sexually Abused: No    Family History  Problem Relation Age of Onset   Heart disease Sister    Hypertension Other       Intake/Output Summary (Last 24 hours) at 12/03/2022 1413 Last data filed at 12/03/2022 1036 Gross per 24 hour  Intake 583 ml  Output 1200 ml  Net -617 ml     Vitals:   12/02/22 2329 12/03/22 0403 12/03/22 0855 12/03/22 1220  BP: (!) 90/55 107/61 (!) 97/55 97/79  Pulse: 72 75 65 60  Resp: 18 18 18 16   Temp: 98.1 F (36.7 C) 98 F (36.7 C) 98.7 F (37.1  C) (!) 97.5 F (36.4 C)  TempSrc: Oral     SpO2: 94% (!) 73% 95% 100%  Weight:   60.8 kg     PHYSICAL EXAM General: Elderly and chronically ill-appearing thin Caucasian male.  Sitting upright in recliner, sleeping comfortably with eyes closed with wife  at bedside  HEENT:  Normocephalic and atraumatic.  Hard of hearing. Neck:  No JVD.  Lungs: Normal respiratory effort on room air.  Decreased breath sounds with trace right basilar crackles. Heart: HRRR . Normal S1 and S2, 3/6 blowing holosystolic murmur best heard at the apex. Abdomen: Nontender to palpation, distended. Msk: Generalized weakness. Extremities: Chronic hyperpigmentation and woody edema, multiple excoriations and wounds with 1+ pitting bilaterally with overall improvement. Neuro: Fatigued, awakens easily to voice psych:  Answers questions appropriately.   Labs: Basic Metabolic Panel: Recent Labs    12/01/22 0522 12/02/22 0311 12/03/22 0514  NA 140 135 135  K 3.3* 4.3 3.7  CL 102 95* 97*  CO2 30 34* 28  GLUCOSE 74 85 80  BUN 42* 47* 44*  CREATININE 1.41* 1.70* 1.56*  CALCIUM 7.7* 8.7* 8.3*  MG 2.3  --   --     Liver Function Tests: No results for input(s): "AST", "ALT", "ALKPHOS", "BILITOT", "PROT", "ALBUMIN" in the last 72 hours. No results for input(s): "LIPASE", "AMYLASE" in the last 72 hours. CBC: Recent Labs    12/01/22 0522  WBC 4.9  NEUTROABS 2.9  HGB 11.8*  HCT 35.8*  MCV 103.5*  PLT 86*    Cardiac Enzymes: No results for input(s): "CKTOTAL", "CKMB", "CKMBINDEX", "TROPONINIHS" in the last 72 hours.  BNP: No results for input(s): "BNP" in the last 72 hours.  D-Dimer: No results for input(s): "DDIMER" in the last 72 hours. Hemoglobin A1C: No results for input(s): "HGBA1C" in the last 72 hours. Fasting Lipid Panel: No results for input(s): "CHOL", "HDL", "LDLCALC", "TRIG", "CHOLHDL", "LDLDIRECT" in the last 72 hours. Thyroid Function Tests: No results for input(s): "TSH", "T4TOTAL",  "T3FREE", "THYROIDAB" in the last 72 hours.  Invalid input(s): "FREET3" Anemia Panel: No results for input(s): "VITAMINB12", "FOLATE", "FERRITIN", "TIBC", "IRON", "RETICCTPCT" in the last 72 hours.   Radiology: US RENAL  Result Date: 11/30/2022 CLINICAL DATA:  Acute kidney injury EXAM: RENAL / URINARY TRACT ULTRASOUND COMPLETE COMPARISON:  Renal ultrasound 09/09/2022.  CT abdomen 12/24/2021 FINDINGS: Right Kidney: Renal measurements: 10.1 x 4.8 x 4.5 cm = volume: 115 mL. No hydronephrosis. Increased echogenicity is seen throughout. There is a 1.7 x 1.4 x 1.8 cm cyst in the upper pole. Left Kidney: Renal measurements: 10.3 x 4.9 x 4.1 cm = volume: 107 mL. No hydronephrosis. Normal echogenicity. Are 2 cysts identified in the upper pole measuring 2.1 x 1.0 x 2.0 cm and 1.1 x 1.0 x 1.2 cm. Bladder: Appears normal for degree of bladder distention. Other: There is trace free fluid around the liver and spleen. IMPRESSION: 1. No hydronephrosis. 2. Increased echogenicity in the right kidney is nonspecific but can be seen in the setting of medical renal disease. 3. Bilateral renal cysts. 4. Trace free fluid around the liver and spleen. Electronically Signed   By: Darliss Cheney M.D.   On: 11/30/2022 17:16   DG Chest 2 View  Result Date: 11/30/2022 CLINICAL DATA:  Shortness of breath. EXAM: CHEST - 2 VIEW COMPARISON:  October 31, 2022. FINDINGS: Stable cardiomegaly. Minimal bibasilar subsegmental atelectasis or edema is noted with minimal pleural effusions. Bony thorax is unremarkable. IMPRESSION: Minimal bibasilar subsegmental atelectasis or edema is noted with minimal bilateral pleural effusions. Aortic Atherosclerosis (ICD10-I70.0). Electronically Signed   By: Lupita Raider M.D.   On: 11/30/2022 11:46    ECHO 08/2022 1. Left ventricular ejection fraction, by estimation, is 55 to 60%. The  left ventricle has normal function. The left ventricle has  no regional  wall motion abnormalities. There is moderate left  ventricular hypertrophy.  Left ventricular diastolic  parameters are indeterminate. There is the interventricular septum is  flattened in systole and diastole, consistent with right ventricular  pressure and volume overload.   2. Right ventricular systolic function is mildly reduced. The right  ventricular size is moderately enlarged. There is severely elevated  pulmonary artery systolic pressure. The estimated right ventricular  systolic pressure is 70.4 mmHg.   3. Left atrial size was moderately dilated.   4. Right atrial size was mildly dilated.   5. The mitral valve is normal in structure. Moderate to severe mitral  valve regurgitation. No evidence of mitral stenosis.   6. Tricuspid valve regurgitation is moderate to severe.   7. The aortic valve has an indeterminant number of cusps. Aortic valve  regurgitation is not visualized. Aortic valve sclerosis/calcification is  present, without any evidence of aortic stenosis.   8. There is borderline dilatation of the ascending aorta, measuring 36  mm.   9. The inferior vena cava is dilated in size with <50% respiratory  variability, suggesting right atrial pressure of 15 mmHg.   TELEMETRY reviewed by me (LT) 12/03/2022 : Sinus rhythm rate 60s  EKG reviewed by me: Sinus rhythm first-degree AV block nonspecific T wave abnormality  Data reviewed by me (LT) 12/03/2022: Hospitalist progress note, nursing notes last 24h vitals tele labs imaging I/O   Principal Problem:   Acute on chronic heart failure with preserved ejection fraction (HFpEF) (HCC) Active Problems:   Longstanding persistent atrial fibrillation (HCC)   Rheumatic mitral regurgitation   Macrocytic anemia   Pulmonary embolism (HCC)   Thrombocytopenia (HCC)   Hematuria   AKI (acute kidney injury) (HCC)   Hypotension   Hypokalemia    ASSESSMENT AND PLAN:  Marcus Kemp. 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  Cardiology is consulted for further assistance.  # Acute on chronic HFpEF # Severe rheumatic MR Presents with significant peripheral edema extending from his feet up into his groin and abdomen despite adjustment of his torsemide on an outpatient basis, limiting his ability to ambulate due to the weight of his legs. Dietary indiscretions with potato chips likely contributory, but family does their best to ensure that the patient appears to his salt and fluid restricted diet.  On admission BNP elevated to 1800 and chest x-ray with pleural effusions.  He is clinically hypervolemic on exam today, although with clinical improvement after initial diuresis. -S/p IV Lasix 40 mg x 1 -Continue Lasix drip at 4 mg/h as BP is low normal -Hold metoprolol for now with borderline blood pressures -Monitor and replenish electrolytes for a goal K >4, mag >2 -Additional GDMT limited by hypotension and renal insufficiency -Strict salt and fluid restriction, strict I's/O -Defer additional cardiac diagnostics at this time. -The patient would benefit from palliative care evaluation/goals of care discussions with the patient's daughter present (as per her request) with repeat hospitalizations and advanced age.  # Paroxysmal atrial fibrillation In sinus rhythm on telemetry.  Continue Eliquis 5 mg twice daily for stroke prevention, and with history of recent pulmonary embolus in February 2024.  Note for signs/symptoms of bleeding, as he has a history of complications with  full dose Eliquis  # AKI Renal function fluctuating with diuresis from creatinine 1.7--1.4--1.7--1.56 today, current GFR 41.  Monitor closely.  # Demand ischemia Borderline elevated at 59, which in the absence of chest pain or EKG changes in  the setting of acute on chronic HFpEF as above, this is most consistent with demand/supply mismatch and not ACS   Anticipate several more days of diuresis to achieve euvolemia.  This patient's plan of care was discussed and created with Dr. Darrold Junker and he is in agreement.  Signed: Rebeca Allegra , PA-C 12/03/2022, 2:13 PM [facility] Cardiology

## 2022-12-03 NOTE — Care Management Important Message (Signed)
Important Message  Patient Details  Name: Marcus Kemp MRN: [id number] Date of Birth: 10-23-1924   Medicare Important Message Given:  N/A - LOS <3 / Initial given by admissions     Marcus Kemp 12/03/2022, 12:10 PM

## 2022-12-03 NOTE — Consult Note (Signed)
Consultation Note Date: 12/03/2022   Patient Name: Marcus Kemp  DOB: 12/03/1924  MRN: [id number]  Age / Sex: 89 y.o., male  PCP: Marguarite Arbour, MD Referring Physician: Burnadette Pop, MD  Reason for Consultation: Establishing goals of care   HPI/Brief Hospital Course: 89 y.o. 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  Familiar to PMT service as he was followed during his admission 08/2022.   Palliative medicine was consulted for assisting with goals of care conversations.  Subjective:  Extensive chart review has been completed prior to meeting patient including labs, vital signs, imaging, progress notes, orders, and available advanced directive documents from current and previous encounters.  Introduced myself as a Publishing rights manager as a member of the palliative care team. Explained palliative medicine is specialized medical care for people living with serious illness. It focuses on providing relief from the symptoms and stress of a serious illness. The goal is to improve quality of life for both the patient and the family.   Visited with Mr, Kemp at his bedside. Awake and alert, sitting up in chair. Conversations difficult with Marcus Kemp as he suffers from sensorium hearing loss. Denies acute pain or discomfort, reports feeling better compared to how he was feeling this morning. Kemp remains in place, wife shares his oxygen levels dropped when he was ambulating in the halls. Symptom burden remains low at this time.  Wife-Marcus Kemp at bedside. Shares her understanding of current medical condition and most recent updates. Mr., Kemp remains on furosemide infusion and appears to be tolerating diuresis-noted soft  blood pressures. She shares Marcus Kemp has been doing fairly well since his admission in February. She remains his primary caretaker in the home, shares he has remained as active as he can. Kathie Rhodes continues to hope for ongoing recovery, feels Marcus Kemp is doing better each day. Her goal remains to be able to return home with Marcus Kemp.  Daughter-Marcus Kemp not able to visit until Sunday. Called and spoke with Marcus Kemp-will attempt to meet in person at bedside during her visit. Anticipating discharge home on Sunday. Will discuss and recommend outpatient palliative services to follow at discharge.  All questions/concerns addressed. Emotional support provided to patient/family/support persons. PMT will continue to follow and support patient as needed.  Objective: Primary Diagnoses: Present on Admission:  Acute on chronic heart failure with preserved ejection fraction (HFpEF) (HCC)  Rheumatic mitral regurgitation  Pulmonary embolism (HCC)  Macrocytic anemia  Longstanding persistent atrial fibrillation (HCC)  Hypokalemia   Physical Exam Constitutional:      General: He is not in acute distress.    Appearance: He is ill-appearing.  Pulmonary:     Effort: Pulmonary effort is normal.  Skin:    General: Skin is warm and dry.  Neurological:     Mental Status: He is alert.     Motor: Weakness present.     Vital Signs: BP 97/79 (BP Location: Right Arm)   Pulse 60   Temp (!) 97.5 F (36.4 C)   Resp 16   Wt 60.8 kg   SpO2 100%   BMI 21.63 kg/m  Pain Scale: 0-10 POSS *See Group Information*: S-Acceptable,Sleep, easy to arouse Pain Score: 0-No pain  IO: Intake/output summary:  Intake/Output Summary (Last 24 hours) at 12/03/2022 1622 Last data filed at 12/03/2022 1036  Gross per 24 hour  Intake 483 ml  Output 1000 ml  Net -517 ml    LBM: Last BM Date : 11/29/22 Baseline Weight: Weight: 64.3 kg Most recent weight: Weight: 60.8 kg       Palliative  Assessment/Data:   Assessment and Plan  SUMMARY OF RECOMMENDATIONS   DNR/DNI Continue current plan of care Goal to return home with wife-Marcus Kemp PMT will continue to follow for ongoing needs and support  Thank you for this consult and allowing Palliative Medicine to participate in the care of Marcus Kemp. Palliative medicine will continue to follow and assist as needed.   Time Total: 55 minutes  Time spent includes: Detailed review of medical records (labs, imaging, vital signs), medically appropriate exam (mental status, respiratory, cardiac, skin), discussed with treatment team, counseling and educating patient, family and staff, documenting clinical information, medication management and coordination of care.   Signed by: Leeanne Deed, DNP, AGNP-C Palliative Medicine    Please contact Palliative Medicine Team phone at [phone] for questions and concerns.  For individual provider: See Loretha Stapler

## 2022-12-03 NOTE — Progress Notes (Signed)
Physical Therapy Treatment Patient Details Name: Marcus Kemp MRN: [id number] DOB: [birth year] Today's Date: 12/03/2022   History of Present Illness Pt admitted for acute/chronic heart failure with preserved EF and complaints of B LE swelling/weakness. HIstory includes heart failure, pulm hyptension, HTN, Afib, renal cell carcinoma, and reheumatic fever.    PT Comments    Pt received in bed, wife at bedside. Pt demonstrating frequent dry unproductive cough. SpO2 on RA supine 87-89%. Upon transferring to EOB to bedsibe recliner, sats dropped to 83%, nursing notified and pt placed on 2L via [place]. After 2 minutes, pt increased to 97% at rest. Gait training with RW, CGA, 59ft on 2L with SpO2 at 93% upon return. No c/o SOB, however feeling weak and required slightly more physical assist this session. Pt positioned to comfort in recliner, LE's elevated, pt left on 2L O2. Will continue to monitor and progress as tolerated.    Recommendations for follow up therapy are one component of a multi-disciplinary discharge planning process, led by the attending physician.  Recommendations may be updated based on patient status, additional functional criteria and insurance authorization.  Follow Up Recommendations       Assistance Recommended at Discharge Set up Supervision/Assistance  Patient can return home with the following A little help with walking and/or transfers   Equipment Recommendations  None recommended by PT    Recommendations for Other Services       Precautions / Restrictions Precautions Precautions: Fall     Mobility  Bed Mobility Overal bed mobility: Needs Assistance Bed Mobility: Supine to Sit     Supine to sit: Min assist     General bed mobility comments:  (Increased assist this am)    Transfers Overall transfer level: Needs assistance Equipment used: Rolling walker (2 wheels) Transfers: Sit to/from Stand Sit to Stand: Min guard           General transfer  comment: Cues for hand placement. Gait belt used for safety.    Ambulation/Gait Ambulation/Gait assistance: Min guard Gait Distance (Feet):  (60) Assistive device: Rolling walker (2 wheels) Gait Pattern/deviations: Step-through pattern Gait velocity: decreased     General Gait Details:  (Pt placed on 2L O2 for gait due to low sats at rest. 93% after ambulating, returning to 97% after rest break)   Stairs             Wheelchair Mobility    Modified Rankin (Stroke Patients Only)       Balance Overall balance assessment: Needs assistance Sitting-balance support: Feet supported Sitting balance-Leahy Scale: Good     Standing balance support: Bilateral upper extremity supported, Reliant on assistive device for balance Standing balance-Leahy Scale: Fair Standing balance comment:  (Definite reliance on RW for dynamic balance.)                            Cognition Arousal/Alertness: Awake/alert Behavior During Therapy: WFL for tasks assessed/performed Overall Cognitive Status: Within Functional Limits for tasks assessed                                 General Comments: very [facility]        Exercises General Exercises - Lower Extremity Ankle Circles/Pumps: AROM, Both, 10 reps Long Arc Quad: AROM, Both, 10 reps    General Comments General comments (skin integrity, edema, etc.):  (Pt and wife educated on role of PT  and current goals. SpO2 between 83-88% at rest on RA, pt placed on 2L per nursing request. SpO2 increased to 97% after 2 minutes)      Pertinent Vitals/Pain Pain Assessment Pain Assessment: No/denies pain    Home Living                          Prior Function            PT Goals (current goals can now be found in the care plan section) Acute Rehab PT Goals Patient Stated Goal:  (Go home)    Frequency    Min 2X/week      PT Plan Current plan remains appropriate    Co-evaluation              AM-PAC  PT "6 Clicks" Mobility   Outcome Measure  Help needed turning from your back to your side while in a flat bed without using bedrails?: None Help needed moving from lying on your back to sitting on the side of a flat bed without using bedrails?: A Little Help needed moving to and from a bed to a chair (including a wheelchair)?: A Little Help needed standing up from a chair using your arms (e.g., wheelchair or bedside chair)?: A Little Help needed to walk in hospital room?: A Little Help needed climbing 3-5 steps with a railing? : A Little 6 Click Score: 19    End of Session Equipment Utilized During Treatment: Gait belt;Oxygen (2L O2) Activity Tolerance: Patient tolerated treatment well Patient left: in chair;with call bell/phone within reach;with family/visitor present Nurse Communication: Mobility status;Other (comment) (Low O2 sats) PT Visit Diagnosis: Muscle weakness (generalized) (M62.81);Difficulty in walking, not elsewhere classified (R26.2)     Time: 1120-1200 PT Time Calculation (min) (ACUTE ONLY): 40 min  Charges:  $Gait Training: 8-22 mins $Therapeutic Exercise: 8-22 mins $Therapeutic Activity: 8-22 mins                    Marcus Kemp, PTA  Marcus Kemp 12/03/2022, 12:13 PM

## 2022-12-03 NOTE — Progress Notes (Signed)
PROGRESS NOTE  Marcus Kemp  WUJ:[id number] DOB: [birth year] DOA: 11/30/2022 PCP: Marguarite Arbour, MD   Brief Narrative: Patient is a 89 year old male with history of heart failure with preserved ejection fraction, recent PE on Eliquis, pulmonary hypertension, persistent A-fib, renal cell carcinoma, rheumatic heart disease who presented to the emergency department from home with complaint of bilateral lower extremity swelling, generalized weakness, dyspnea on exertion.  On presentation, she was found to be hypotensive.  Lab work showed creatinine of 1.7, BNP of 1813.  Chest x-ray showed features of pulmonary edema, minimal bilateral pleural effusion.  Cardiology consulted.  Currently on IV Lasix.  Assessment & Plan:  Principal Problem:   Acute on chronic heart failure with preserved ejection fraction (HFpEF) (HCC) Active Problems:   AKI (acute kidney injury) (HCC)   Hypotension   Rheumatic mitral regurgitation   Longstanding persistent atrial fibrillation (HCC)   Macrocytic anemia   Pulmonary embolism (HCC)   Thrombocytopenia (HCC)   Hematuria   Hypokalemia  Acute on chronic diastolic congestive heart failure: Presented with gradual worsening of lower extremity edema, weight gain.  Takes torsemide 40 mg twice a day at home.  Recent echo in February 2024 showed EF of 55 to 60%, moderate LVH, mildly reduced RV systolic function, severely elevated pulmonary systolic pressure.  Currently on Lasix drip.  Cardiology following  AKI on CKD stage IIIa: Likely from cardiorenal syndrome from acute on chronic CHF.  Kidney function improving with Lasix monitor input/output, daily weight.  Creatinine on 10/27/2022 was 1.38  Acute hypoxic respiratory failure: This is most likely secondary to volume overload/pulmonary edema on admission.  Lungs were almost clear on auscultation today.  Does not use oxygen at home.  Will try to monitor him on room air  Chronic hypotension: Currently on  midodrine  Rheumatic heart disease: History of rheumatic fever .  Has some moderate to severe MR as per echo.  She is not a surgical candidate  Persistent A-fib: Currently rate is controlled.  On Eliquis and metoprolol.  Monitor on telemetry.  Currently in normal sinus rhythm  Microscopic hematuria: Noted on urinalysis.  We recommend follow-up with urology as an outpatient if needed  Thrombocytopenia: Mild.  Stable, chronic.  Continue to monitor  History of PE: On Eliquis  Microcytic anemia: Hemoglobin stable  Hypokalemia: Continue to monitor and supplement as needed  Goals of care: Elderly patient with diastolic heart failure,on iv drip lasix.DNR. Goals of care discussion will be helpful. Ambulates with walker ,lives with family       DVT prophylaxis: apixaban (ELIQUIS) tablet 5 mg     Code Status: DNR  Family Communication: Called and discussed with daughter on phone on 5/7  Patient status:Inpatient  Patient is from :Home  Anticipated discharge NF:AOZH  Estimated DC date:2-3 days,needs cardiology clearance   Consultants: Cardiology  Procedures:None  Antimicrobials:  Anti-infectives (From admission, onward)    None       Subjective: Patient seen and examined the bedside today.  He was sitting in the chair.  Alert, awake and mostly oriented.  Remains comfortable.  Did not cough during my evaluation.  Speaking in full sentences.  Very hard of hearing.  Denies any worsening shortness of breath.  Bilateral lower extremity edema is still persist but significantly getting better.  Objective: Vitals:   12/02/22 2329 12/03/22 0403 12/03/22 0855 12/03/22 1220  BP: (!) 90/55 107/61 (!) 97/55 97/79  Pulse: 72 75 65 60  Resp: 18 18 18 16   Temp: 98.1  F (36.7 C) 98 F (36.7 C) 98.7 F (37.1 C) (!) 97.5 F (36.4 C)  TempSrc: Oral     SpO2: 94% (!) 73% 95% 100%  Weight:   60.8 kg     Intake/Output Summary (Last 24 hours) at 12/03/2022 1408 Last data filed at  12/03/2022 1036 Gross per 24 hour  Intake 583 ml  Output 1200 ml  Net -617 ml   Filed Weights   12/02/22 0500 12/03/22 0855  Weight: 64.3 kg 60.8 kg    Examination:  General exam: Overall comfortable, not in distress, pleasant elderly male HEENT: PERRL Respiratory system:  no wheezes or crackles, diminished sounds bilaterally Cardiovascular system: S1 & S2 heard, RRR.  Gastrointestinal system: Abdomen is nondistended, soft and nontender. Central nervous system: Alert and oriented Extremities: 2-3+ bilateral lower extremity pitting edema, no clubbing ,no cyanosis Skin: Scattered purpura, no ulcers  Data Reviewed: I have personally reviewed following labs and imaging studies  CBC: Recent Labs  Lab 11/30/22 1051 12/01/22 0522  WBC 5.0 4.9  NEUTROABS  --  2.9  HGB 12.0* 11.8*  HCT 37.0* 35.8*  MCV 104.8* 103.5*  PLT 105* 86*   Basic Metabolic Panel: Recent Labs  Lab 11/30/22 1201 12/01/22 0522 12/02/22 0311 12/03/22 0514  NA 138 140 135 135  K 3.4* 3.3* 4.3 3.7  CL 96* 102 95* 97*  CO2 30 30 34* 28  GLUCOSE 104* 74 85 80  BUN 47* 42* 47* 44*  CREATININE 1.74* 1.41* 1.70* 1.56*  CALCIUM 8.9 7.7* 8.7* 8.3*  MG  --  2.3  --   --      No results found for this or any previous visit (from the past 240 hour(s)).   Radiology Studies: No results found.  Scheduled Meds:  apixaban  5 mg Oral BID   midodrine  5 mg Oral TID WC   rOPINIRole  1 mg Oral QHS   traZODone  50 mg Oral QHS   Continuous Infusions:  furosemide (LASIX) 200 mg in dextrose 5 % 100 mL (2 mg/mL) infusion 4 mg/hr (12/02/22 1151)     LOS: 3 days   Burnadette Pop, MD [facility] P5/04/2023, 2:08 PM

## 2022-12-04 DIAGNOSIS — I5033 Acute on chronic diastolic (congestive) heart failure: Secondary | ICD-10-CM | POA: Diagnosis not present

## 2022-12-04 DIAGNOSIS — I959 Hypotension, unspecified: Secondary | ICD-10-CM | POA: Diagnosis not present

## 2022-12-04 DIAGNOSIS — I4811 Longstanding persistent atrial fibrillation: Secondary | ICD-10-CM | POA: Diagnosis not present

## 2022-12-04 DIAGNOSIS — N179 Acute kidney failure, unspecified: Secondary | ICD-10-CM | POA: Diagnosis not present

## 2022-12-04 LAB — BASIC METABOLIC PANEL
Anion gap: 7 (ref 5–15)
BUN: 45 mg/dL — ABNORMAL HIGH (ref 8–23)
CO2: 30 mmol/L (ref 22–32)
Calcium: 8.4 mg/dL — ABNORMAL LOW (ref 8.9–10.3)
Chloride: 98 mmol/L (ref 98–111)
Creatinine, Ser: 1.45 mg/dL — ABNORMAL HIGH (ref 0.61–1.24)
GFR, Estimated: 44 mL/min — ABNORMAL LOW (ref >60)
Glucose, Bld: 79 mg/dL (ref 70–99)
Potassium: 3.8 mmol/L (ref 3.5–5.1)
Sodium: 135 mmol/L (ref 135–145)

## 2022-12-04 LAB — CBC
HCT: 34.8 % — ABNORMAL LOW (ref 39.0–52.0)
Hemoglobin: 11.6 g/dL — ABNORMAL LOW (ref 13.0–17.0)
MCH: 33.7 pg (ref 26.0–34.0)
MCHC: 33.3 g/dL (ref 30.0–36.0)
MCV: 101.2 fL — ABNORMAL HIGH (ref 80.0–100.0)
Platelets: 79 10*3/uL — ABNORMAL LOW (ref 150–400)
RBC: 3.44 MIL/uL — ABNORMAL LOW (ref 4.22–5.81)
RDW: 18.8 % — ABNORMAL HIGH (ref 11.5–15.5)
WBC: 6 10*3/uL (ref 4.0–10.5)
nRBC: 0.3 % — ABNORMAL HIGH (ref 0.0–0.2)

## 2022-12-04 LAB — MAGNESIUM: Magnesium: 2.3 mg/dL (ref 1.7–2.4)

## 2022-12-04 MED ORDER — POTASSIUM CHLORIDE 20 MEQ PO PACK
40.0000 meq | PACK | Freq: Once | ORAL | Status: AC
  Administered 2022-12-04: 40 meq via ORAL
  Filled 2022-12-04: qty 2

## 2022-12-04 NOTE — Progress Notes (Signed)
   12/04/22 1300  Mobility  Activity Ambulated with assistance to bathroom;Stood at bedside;Dangled on edge of bed  Level of Assistance Standby assist, set-up cues, supervision of patient - no hands on  Assistive Device Front wheel walker  Distance Ambulated (ft) 10 ft  Activity Response Tolerated well  Mobility Referral Yes  $Mobility charge 1 Mobility   Pt resting in bed on 2L upon entry. Pt STS and ambulates to bathroom SBA with RW (+2 advised for line management). Pt returned to recliner and left with needs in reach and chair alarm activated.   Johnathan Hausen Mobility Specialist 12/04/22, 2:17 PM

## 2022-12-04 NOTE — Progress Notes (Addendum)
[facility] CLINIC CARDIOLOGY CONSULT NOTE       Patient ID: Marcus Kemp MRN: [id number] DOB/AGE: 02/03/1925 89 y.o.  Admit date: 11/30/2022 Referring Physician Dr. Joylene Igo Primary Physician Dr. Judithann Sheen  Primary Cardiologist Dr. Darrold Junker Reason for Consultation acute on chronic HF   HPI: Marcus Kemp "Marcus Kemp" is a 89yoM with a PMH of HFpEF (>55%), severe rheumatic MR, paroxysmal AF s/p unsuccessful DCCV 04/2016 (dose reduced eliquis), pulmonary HTN, hx PE (08/2022) who presented to [facility] ED 11/30/2022 with significant weight gain and peripheral edema despite increasing of the dose and frequency of his torsemide.  Cardiology is consulted for further assistance.  Interval history: -Patient appears comfortable laying nearly flat in bed on my exam this AM. Denies any CP, SOB.  -Diuresed 1.8L net since admission. Cr improved this AM at 1.45 from 1.56 yesterday. Remains stable on 2 L [place].  -BP remains low on Lasix infusion.   Review of systems complete and found to be negative unless listed above     Past Medical History:  Diagnosis Date   A-fib (HCC)    a.) CHA2DS2-VASc = 4 (age x 2, CHF, aortic plaque). b.) s/p unsuccessful TEE cardioversion 04/28/2016 --> rec'd 200 J x1 and 360 J x 2. c.) rate/rhythm maintained on oral metoprolol succinante; chronically anticoagulated using dose reduced apixaban.   Anemia    Aortic atherosclerosis (HCC)    Arthritis    CHF (congestive heart failure) (HCC)    a.) TTE 04/26/2016: EF >55%; mod LVH; MVP; mild-mod LAE, mild MR. b.) TTE 05/14/209: EF >55%; mod RVE; mild BAE; mod MR.TR; triv AR/PR. c.) TTE 03/28/2019: EF >55%; mild LVH; mod BAE; mild PR, mod MR/TR.   GERD (gastroesophageal reflux disease)    Heart murmur    History of kidney stones    HOH (hard of hearing)    a.) hearing aid to LEFT ear.   Hyperlipidemia    Hypertension    Infrarenal abdominal aortic aneurysm (AAA) without rupture (HCC) 05/05/2021   a.) CT 05/05/2021 --> measured 3.0  cm.   MVP (mitral valve prolapse)    Peripheral edema    Pneumonia    Pulmonary embolus (HCC)    Pulmonary HTN (HCC) 04/26/2016   a.) TTE 04/26/2016: EF >55%; PASP 45. b.) TTE 12/06/2017: EF >55%; RVSP 76.6 mmHg. c.) TTE 03/28/2019: EF >55%; RVSP 77.8 mmHg.   Renal cell carcinoma (HCC)    Rheumatic fever    Sepsis (HCC)    Valvular heart disease     Past Surgical History:  Procedure Laterality Date   INGUINAL HERNIA REPAIR Left 01/13/2022   Procedure: HERNIA REPAIR INGUINAL ADULT;  Surgeon: Earline Mayotte, MD;  Location: [facility] ORS;  Service: General;  Laterality: Left;   INSERTION OF MESH  01/13/2022   Procedure: INSERTION OF MESH;  Surgeon: Earline Mayotte, MD;  Location: [facility] ORS;  Service: General;;   LITHOTRIPSY     TEE WITH CARDIOVERSION N/A    P   TONSILLECTOMY      Medications Prior to Admission  Medication Sig Dispense Refill Last Dose   apixaban (ELIQUIS) 5 MG TABS tablet Take 5 mg by mouth 2 (two) times daily.   11/30/2022 at 0930   Azelastine HCl 137 MCG/SPRAY SOLN Place 1 spray into the nose 2 (two) times daily.   11/30/2022   cetirizine (ZYRTEC) 10 MG tablet SMARTSIG:1 pill By Mouth Daily PRN   unknown   empagliflozin (JARDIANCE) 10 MG TABS tablet Take 1 tablet (10 mg total)  by mouth daily. 30 tablet 5 11/30/2022   fluorouracil (EFUDEX) 5 % cream Apply 1 Application topically 2 (two) times daily.   11/29/2022   metoprolol succinate (TOPROL-XL) 25 MG 24 hr tablet Take 1 tablet (25 mg total) by mouth every morning. 30 tablet 5 11/30/2022   midodrine (PROAMATINE) 5 MG tablet Take 5 mg by mouth as needed. For SBP >100   unknown   rOPINIRole (REQUIP) 1 MG tablet Take 1 mg by mouth at bedtime.   11/29/2022   torsemide (DEMADEX) 20 MG tablet 40mg  in the morning and 20mg  in the evening 90 tablet 3 11/30/2022   traZODone (DESYREL) 50 MG tablet Take 50 mg by mouth at bedtime.   11/29/2022   vitamin B-12 (CYANOCOBALAMIN) 500 MCG tablet Take 1 tablet (500 mcg total) by mouth daily. 90 tablet  3 11/30/2022    Social History   Socioeconomic History   Marital status: Married    Spouse name: Not on file   Number of children: Not on file   Years of education: Not on file   Highest education level: Not on file  Occupational History   Not on file  Tobacco Use   Smoking status: Former    Packs/day: 0.25    Years: 8.00    Additional pack years: 0.00    Total pack years: 2.00    Types: Cigarettes    Quit date: 60    Years since quitting: 84.4   Smokeless tobacco: Never  Vaping Use   Vaping Use: Never used  Substance and Sexual Activity   Alcohol use: No   Drug use: Never   Sexual activity: Not on file  Other Topics Concern   Not on file  Social History Narrative   Not on file   Social Determinants of Health   Financial Resource Strain: Not on file  Food Insecurity: No Food Insecurity (12/01/2022)   Hunger Vital Sign    Worried About Running Out of Food in the Last Year: Never true    Ran Out of Food in the Last Year: Never true  Transportation Needs: No Transportation Needs (12/01/2022)   PRAPARE - Administrator, Civil Service (Medical): No    Lack of Transportation (Non-Medical): No  Physical Activity: Not on file  Stress: Not on file  Social Connections: Not on file  Intimate Partner Violence: Not At Risk (12/01/2022)   Humiliation, Afraid, Rape, and Kick questionnaire    Fear of Current or Ex-Partner: No    Emotionally Abused: No    Physically Abused: No    Sexually Abused: No    Family History  Problem Relation Age of Onset   Heart disease Sister    Hypertension Other       Intake/Output Summary (Last 24 hours) at 12/04/2022 0904 Last data filed at 12/03/2022 1036 Gross per 24 hour  Intake 240 ml  Output --  Net 240 ml    Vitals:   12/03/22 2021 12/04/22 0008 12/04/22 0434 12/04/22 0748  BP: 91/80 (!) 134/90 (!) 93/51 (!) 88/57  Pulse: 72 66 72 69  Resp: 19 17 16 18   Temp: 98.3 F (36.8 C) 98.4 F (36.9 C) (!) 97.4 F (36.3 C)  (!) 97.3 F (36.3 C)  TempSrc:  Oral Oral   SpO2: 100% 97% 92%   Weight:        PHYSICAL EXAM General: Elderly and chronically ill-appearing thin Caucasian male. Laying in bed, sleeping comfortably with eyes closed with no family present  HEENT:  Normocephalic and atraumatic. Hard of hearing. Neck:  No JVD.  Lungs: Normal respiratory effort on room air.  Decreased breath sounds with trace right basilar crackles. Heart: HRRR . Normal S1 and S2, 3/6 blowing holosystolic murmur best heard at the apex. Abdomen: Nontender to palpation, distended. Msk: Generalized weakness. Extremities: Chronic hyperpigmentation and woody edema, multiple excoriations and wounds with 1+ pitting bilaterally with overall improvement. Neuro: Fatigued, awakens easily to voice psych:  Answers questions appropriately.   Labs: Basic Metabolic Panel: Recent Labs    12/03/22 0514 12/04/22 0603  NA 135 135  K 3.7 3.8  CL 97* 98  CO2 28 30  GLUCOSE 80 79  BUN 44* 45*  CREATININE 1.56* 1.45*  CALCIUM 8.3* 8.4*   Liver Function Tests: No results for input(s): "AST", "ALT", "ALKPHOS", "BILITOT", "PROT", "ALBUMIN" in the last 72 hours. No results for input(s): "LIPASE", "AMYLASE" in the last 72 hours. CBC: Recent Labs    12/04/22 0603  WBC 6.0  HGB 11.6*  HCT 34.8*  MCV 101.2*  PLT 79*   Cardiac Enzymes: No results for input(s): "CKTOTAL", "CKMB", "CKMBINDEX", "TROPONINIHS" in the last 72 hours.  BNP: No results for input(s): "BNP" in the last 72 hours.  D-Dimer: No results for input(s): "DDIMER" in the last 72 hours. Hemoglobin A1C: No results for input(s): "HGBA1C" in the last 72 hours. Fasting Lipid Panel: No results for input(s): "CHOL", "HDL", "LDLCALC", "TRIG", "CHOLHDL", "LDLDIRECT" in the last 72 hours. Thyroid Function Tests: No results for input(s): "TSH", "T4TOTAL", "T3FREE", "THYROIDAB" in the last 72 hours.  Invalid input(s): "FREET3" Anemia Panel: No results for input(s):  "VITAMINB12", "FOLATE", "FERRITIN", "TIBC", "IRON", "RETICCTPCT" in the last 72 hours.   Radiology: US RENAL  Result Date: 11/30/2022 CLINICAL DATA:  Acute kidney injury EXAM: RENAL / URINARY TRACT ULTRASOUND COMPLETE COMPARISON:  Renal ultrasound 09/09/2022.  CT abdomen 12/24/2021 FINDINGS: Right Kidney: Renal measurements: 10.1 x 4.8 x 4.5 cm = volume: 115 mL. No hydronephrosis. Increased echogenicity is seen throughout. There is a 1.7 x 1.4 x 1.8 cm cyst in the upper pole. Left Kidney: Renal measurements: 10.3 x 4.9 x 4.1 cm = volume: 107 mL. No hydronephrosis. Normal echogenicity. Are 2 cysts identified in the upper pole measuring 2.1 x 1.0 x 2.0 cm and 1.1 x 1.0 x 1.2 cm. Bladder: Appears normal for degree of bladder distention. Other: There is trace free fluid around the liver and spleen. IMPRESSION: 1. No hydronephrosis. 2. Increased echogenicity in the right kidney is nonspecific but can be seen in the setting of medical renal disease. 3. Bilateral renal cysts. 4. Trace free fluid around the liver and spleen. Electronically Signed   By: Darliss Cheney M.D.   On: 11/30/2022 17:16   DG Chest 2 View  Result Date: 11/30/2022 CLINICAL DATA:  Shortness of breath. EXAM: CHEST - 2 VIEW COMPARISON:  October 31, 2022. FINDINGS: Stable cardiomegaly. Minimal bibasilar subsegmental atelectasis or edema is noted with minimal pleural effusions. Bony thorax is unremarkable. IMPRESSION: Minimal bibasilar subsegmental atelectasis or edema is noted with minimal bilateral pleural effusions. Aortic Atherosclerosis (ICD10-I70.0). Electronically Signed   By: Lupita Raider M.D.   On: 11/30/2022 11:46    ECHO 08/2022 1. Left ventricular ejection fraction, by estimation, is 55 to 60%. The  left ventricle has normal function. The left ventricle has no regional  wall motion abnormalities. There is moderate left ventricular hypertrophy.  Left ventricular diastolic  parameters are indeterminate. There is the interventricular  septum is  flattened in systole and diastole, consistent with right ventricular  pressure and volume overload.   2. Right ventricular systolic function is mildly reduced. The right  ventricular size is moderately enlarged. There is severely elevated  pulmonary artery systolic pressure. The estimated right ventricular  systolic pressure is 70.4 mmHg.   3. Left atrial size was moderately dilated.   4. Right atrial size was mildly dilated.   5. The mitral valve is normal in structure. Moderate to severe mitral  valve regurgitation. No evidence of mitral stenosis.   6. Tricuspid valve regurgitation is moderate to severe.   7. The aortic valve has an indeterminant number of cusps. Aortic valve  regurgitation is not visualized. Aortic valve sclerosis/calcification is  present, without any evidence of aortic stenosis.   8. There is borderline dilatation of the ascending aorta, measuring 36  mm.   9. The inferior vena cava is dilated in size with <50% respiratory  variability, suggesting right atrial pressure of 15 mmHg.   TELEMETRY reviewed by me [facility]) 12/04/2022 : Sinus rhythm rate 70s with PVCs  EKG reviewed by me: Sinus rhythm first-degree AV block nonspecific T wave abnormality  Data reviewed by me [facility]) 12/04/2022: Hospitalist progress note, nursing notes last 24h vitals tele labs imaging I/O   Principal Problem:   Acute on chronic heart failure with preserved ejection fraction (HFpEF) (HCC) Active Problems:   Longstanding persistent atrial fibrillation (HCC)   Rheumatic mitral regurgitation   Macrocytic anemia   Pulmonary embolism (HCC)   Thrombocytopenia (HCC)   Hematuria   AKI (acute kidney injury) (HCC)   Hypotension   Hypokalemia    ASSESSMENT AND PLAN:  Marcus Kemp. Marcus Kemp "Marcus Kemp" is a 89yoM with a PMH of HFpEF (>55%), severe rheumatic MR, paroxysmal AF s/p unsuccessful DCCV 04/2016 (dose reduced eliquis), pulmonary HTN, hx PE (08/2022) who presented to [facility] ED 11/30/2022 with  significant weight gain and peripheral edema despite increasing of the dose and frequency of his torsemide.  Cardiology is consulted for further assistance.  # Acute on chronic HFpEF # Severe rheumatic MR Presents with significant peripheral edema extending from his feet up into his groin and abdomen despite adjustment of his torsemide on an outpatient basis, limiting his ability to ambulate due to the weight of his legs. Dietary indiscretions with potato chips likely contributory, but family does their best to ensure that the patient appears to his salt and fluid restricted diet.  On admission BNP elevated to 1800 and chest x-ray with pleural effusions.  He is clinically hypervolemic on exam today, although with clinical improvement after initial diuresis. Weight down 3.5 kg since 5/8.  -S/p IV Lasix 40 mg x 1 in ED -Continue Lasix drip at 4 mg/h as BP is low normal.  -Wean O2 as tolerated.  -Hold metoprolol for now with borderline blood pressures -Will give K this morning and check Mag. Monitor and replenish electrolytes for a goal K >4, mag >2 -Additional GDMT limited by hypotension and renal insufficiency -Strict salt and fluid restriction, strict I/O's -Defer additional cardiac diagnostics at this time. -The patient would benefit from palliative care evaluation/goals of care discussions with the patient's daughter present (as per her request) with repeat hospitalizations and advanced age.  # Paroxysmal atrial fibrillation In sinus rhythm on telemetry.  Continue Eliquis 5 mg twice daily for stroke prevention, and with history of recent pulmonary embolus in February 2024.  Note for signs/symptoms of bleeding, as he has a history of complications with full dose Eliquis  #  AKI Renal function fluctuating with diuresis from creatinine 1.7-1.4 -1.7--1.56 --1.45 today, current GFR 44. Monitor closely.  # Demand ischemia Borderline elevated at 59, which in the absence of chest pain or EKG changes  in the setting of acute on chronic HFpEF as above, this is most consistent with demand/supply mismatch and not ACS   Anticipate we are approaching euvolemia.   This patient's plan of care was discussed and created with Dr. Darrold Junker and he is in agreement.  Signed: Gale Journey , PA-C 12/04/2022, 9:04 AM [facility] Cardiology

## 2022-12-04 NOTE — Progress Notes (Signed)
Mobility Specialist - Progress Note   Pre-mobility: SpO2(98) During mobility:  SpO2(93)      12/04/22 1141  Therapy Vitals  Pulse Rate 74  Resp 18  BP (!) 101/47  Patient Position (if appropriate) Lying  Oxygen Therapy  SpO2 93 %  O2 Device Nasal Cannula  O2 Flow Rate (L/min) 2 L/min  Mobility  Activity Ambulated with assistance in hallway;Stood at bedside;Dangled on edge of bed  Level of Assistance Standby assist, set-up cues, supervision of patient - no hands on  Assistive Device Front wheel walker  Distance Ambulated (ft) 80 ft  Mobility Referral Yes  $Mobility charge 1 Mobility  Mobility Specialist Start Time (ACUTE ONLY) 1130  Mobility Specialist Stop Time (ACUTE ONLY) 1215  Mobility Specialist Time Calculation (min) (ACUTE ONLY) 45 min   Pt resting EOB on 2L upon entry. Pt STS and ambulates to hallway with RW (+2 advised for line management) SBA. Pt returned to bed and left with needs in reach and bed alarm activated.   Johnathan Hausen Mobility Specialist 12/04/22, 2:15 PM

## 2022-12-04 NOTE — Progress Notes (Signed)
Daily Progress Note   Patient Name: Marcus Kemp       Date: 12/04/2022 DOB: [birth year]  Age: 89 y.o. MRN#: [id number] Attending Physician: Arnetha Courser, MD Primary Care Physician: Marguarite Arbour, MD Admit Date: 11/30/2022  Reason for Consultation/Follow-up: Establishing goals of care  HPI/Brief Hospital Review: 89 y.o. 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   Familiar to PMT service as he was followed during his admission 08/2022.    Palliative medicine was consulted for assisting with goals of care conversations.  Subjective: Extensive chart review has been completed prior to meeting patient including labs, vital signs, imaging, progress notes, orders, and available advanced directive documents from current and previous encounters.    Visited with Marcus Kemp at his bedside. ROS and exam limited due to [facility], initially wife not at bedside during AM rounds. Returned later in the afternoon, Betty-wife present at bedside. Kathie Rhodes shares she feels Marcus Kemp has had a good day without complaints. He is sitting up in chair, eating lunch, denies acute pain or discomfort. Continues to require supplemental oxygen.  Kathie Rhodes shares their daughter-Marcus Kemp plans to visit late morning/early afternoon, shared I will be available at that time to visit with family for ongoing GOC discussions.  Objective:  Physical Exam Constitutional:      General: He is not in acute distress.    Appearance: He is ill-appearing.  Pulmonary:     Effort: Pulmonary effort is  normal. No respiratory distress.  Skin:    General: Skin is warm and dry.     Findings: Bruising present.  Neurological:     Mental Status: He is alert.     Motor: Weakness present.            Vital Signs: BP (!) 101/47 (BP Location: Right Arm)   Pulse 74   Temp 97.6 F (36.4 C) (Oral)   Resp 18   Wt 57.7 kg   SpO2 93%   BMI 20.53 kg/m  SpO2: SpO2: 93 % O2 Device: O2 Device: Nasal Cannula O2 Flow Rate: O2 Flow Rate (L/min): 2 L/min   Palliative Care Assessment & Plan  Assessment/Recommendation/Plan  DNR Meeting planned 5/12 with daughter present PMT to continue to follow for ongoing needs and support   Thank you for allowing the Palliative Medicine Team to assist in the care of this patient.  Total time:  25 minutes  Time spent includes: Detailed review of medical records (labs, imaging, vital signs), medically appropriate exam (mental status, respiratory, cardiac, skin), discussed with treatment team, counseling and educating patient, family and staff, documenting clinical information, medication management and coordination of care.  Leeanne Deed, DNP, AGNP-C Palliative Medicine   Please contact Palliative Medicine Team phone at [phone] for questions and concerns.

## 2022-12-04 NOTE — Progress Notes (Signed)
PROGRESS NOTE  IASON DANH  Kemp:[id number] DOB: February 01, 1925 DOA: 11/30/2022 PCP: Marguarite Arbour, MD   Brief Narrative: Patient is a 89 year old male with history of heart failure with preserved ejection fraction, recent PE on Eliquis, pulmonary hypertension, persistent A-fib, renal cell carcinoma, rheumatic heart disease who presented to the emergency department from home with complaint of bilateral lower extremity swelling, generalized weakness, dyspnea on exertion.  On presentation, she was found to be hypotensive.  Lab work showed creatinine of 1.7, BNP of 1813.  Chest x-ray showed features of pulmonary edema, minimal bilateral pleural effusion.  Cardiology consulted.  Currently on IV Lasix.  5/11:Hemodynamically stable, no urinary output recorded.  Cardiology would like to continue Lasix infusion for another day and then might switch to p.o.  Renal functions continue to improve  Assessment & Plan:  Principal Problem:   Acute on chronic heart failure with preserved ejection fraction (HFpEF) (HCC) Active Problems:   AKI (acute kidney injury) (HCC)   Hypotension   Rheumatic mitral regurgitation   Longstanding persistent atrial fibrillation (HCC)   Macrocytic anemia   Pulmonary embolism (HCC)   Thrombocytopenia (HCC)   Hematuria   Hypokalemia  Acute on chronic diastolic congestive heart failure: Presented with gradual worsening of lower extremity edema, weight gain.  Takes torsemide 40 mg twice a day at home.  Recent echo in February 2024 showed EF of 55 to 60%, moderate LVH, mildly reduced RV systolic function, severely elevated pulmonary systolic pressure.  Currently on Lasix drip.  Cardiology following  AKI on CKD stage IIIa: Likely from cardiorenal syndrome from acute on chronic CHF.  Kidney function improving with Lasix monitor input/output, daily weight.  Creatinine on 10/27/2022 was 1.38  Acute hypoxic respiratory failure: This is most likely secondary to volume  overload/pulmonary edema on admission.  Lungs were almost clear on auscultation today.  Does not use oxygen at home.  Will try to monitor him on room air  Chronic hypotension: Currently on midodrine  Rheumatic heart disease: History of rheumatic fever .  Has some moderate to severe MR as per echo.  She is not a surgical candidate  Persistent A-fib: Currently rate is controlled.  On Eliquis and metoprolol.  Monitor on telemetry.  Currently in normal sinus rhythm  Microscopic hematuria: Noted on urinalysis.  We recommend follow-up with urology as an outpatient if needed  Thrombocytopenia: Mild.  Stable, chronic.  Continue to monitor  History of PE: On Eliquis  Microcytic anemia: Hemoglobin stable  Hypokalemia: Continue to monitor and supplement as needed  Goals of care: Elderly patient with diastolic heart failure,on iv drip lasix.DNR. Goals of care discussion will be helpful. Ambulates with walker ,lives with family Plan is to go go home with palliative care following as outpatient.      DVT prophylaxis: apixaban (ELIQUIS) tablet 5 mg     Code Status: DNR  Family Communication: Called and discussed with daughter on phone on 5/7  Patient status:Inpatient  Patient is from :Home  Anticipated discharge NF:AOZH  Estimated DC date:2-3 days,needs cardiology clearance   Consultants: Cardiology  Procedures:None  Antimicrobials:  Anti-infectives (From admission, onward)    None       Subjective: Patient was sitting in chair when seen today.  Nursing concern of some coughing with drinking so swallow evaluation was ordered.  Denies any chest pain or worsening shortness of breath  Objective: Vitals:   12/04/22 0434 12/04/22 0713 12/04/22 0748 12/04/22 1141  BP: (!) 93/51  (!) 88/57 (!) 101/47  Pulse: 72  69 74  Resp: 16  18 18   Temp: (!) 97.4 F (36.3 C)  (!) 97.3 F (36.3 C) 97.6 F (36.4 C)  TempSrc: Oral   Oral  SpO2: 92%   93%  Weight:  57.7 kg       Intake/Output Summary (Last 24 hours) at 12/04/2022 1440 Last data filed at 12/04/2022 1042 Gross per 24 hour  Intake 340 ml  Output --  Net 340 ml    Filed Weights   12/02/22 0500 12/03/22 0855 12/04/22 0713  Weight: 64.3 kg 60.8 kg 57.7 kg    Examination:  General.  Frail elderly man, in no acute distress. Pulmonary.  Few basal crackles bilaterally, normal respiratory effort. CV.  Regular rate and rhythm, no JVD, rub or murmur. Abdomen.  Soft, nontender, nondistended, BS positive. CNS.  Alert and oriented .  No focal neurologic deficit. Extremities.  1+ LE edema, signs of chronic venous congestion, pulses intact and symmetrical. Psychiatry.  Appears to have some cognitive impairment  Data Reviewed: I have personally reviewed following labs and imaging studies  CBC: Recent Labs  Lab 11/30/22 1051 12/01/22 0522 12/04/22 0603  WBC 5.0 4.9 6.0  NEUTROABS  --  2.9  --   HGB 12.0* 11.8* 11.6*  HCT 37.0* 35.8* 34.8*  MCV 104.8* 103.5* 101.2*  PLT 105* 86* 79*    Basic Metabolic Panel: Recent Labs  Lab 11/30/22 1201 12/01/22 0522 12/02/22 0311 12/03/22 0514 12/04/22 0603  NA 138 140 135 135 135  K 3.4* 3.3* 4.3 3.7 3.8  CL 96* 102 95* 97* 98  CO2 30 30 34* 28 30  GLUCOSE 104* 74 85 80 79  BUN 47* 42* 47* 44* 45*  CREATININE 1.74* 1.41* 1.70* 1.56* 1.45*  CALCIUM 8.9 7.7* 8.7* 8.3* 8.4*  MG  --  2.3  --   --  2.3      No results found for this or any previous visit (from the past 240 hour(s)).   Radiology Studies: No results found.  Scheduled Meds:  apixaban  5 mg Oral BID   midodrine  5 mg Oral TID WC   rOPINIRole  1 mg Oral QHS   traZODone  50 mg Oral QHS   Continuous Infusions:  furosemide (LASIX) 200 mg in dextrose 5 % 100 mL (2 mg/mL) infusion 4 mg/hr (12/04/22 1305)     LOS: 4 days   Arnetha Courser, MD [facility] P5/05/2023, 2:40 PM

## 2022-12-05 ENCOUNTER — Inpatient Hospital Stay: Payer: Medicare Other

## 2022-12-05 DIAGNOSIS — I5033 Acute on chronic diastolic (congestive) heart failure: Secondary | ICD-10-CM | POA: Diagnosis not present

## 2022-12-05 DIAGNOSIS — I4811 Longstanding persistent atrial fibrillation: Secondary | ICD-10-CM | POA: Diagnosis not present

## 2022-12-05 DIAGNOSIS — N179 Acute kidney failure, unspecified: Secondary | ICD-10-CM | POA: Diagnosis not present

## 2022-12-05 DIAGNOSIS — I959 Hypotension, unspecified: Secondary | ICD-10-CM | POA: Diagnosis not present

## 2022-12-05 LAB — BASIC METABOLIC PANEL
Anion gap: 8 (ref 5–15)
BUN: 36 mg/dL — ABNORMAL HIGH (ref 8–23)
CO2: 32 mmol/L (ref 22–32)
Calcium: 8.2 mg/dL — ABNORMAL LOW (ref 8.9–10.3)
Chloride: 96 mmol/L — ABNORMAL LOW (ref 98–111)
Creatinine, Ser: 1.26 mg/dL — ABNORMAL HIGH (ref 0.61–1.24)
GFR, Estimated: 53 mL/min — ABNORMAL LOW (ref >60)
Glucose, Bld: 95 mg/dL (ref 70–99)
Potassium: 3.6 mmol/L (ref 3.5–5.1)
Sodium: 136 mmol/L (ref 135–145)

## 2022-12-05 MED ORDER — POTASSIUM CHLORIDE 20 MEQ PO PACK
40.0000 meq | PACK | Freq: Once | ORAL | Status: AC
  Administered 2022-12-05: 40 meq via ORAL
  Filled 2022-12-05: qty 2

## 2022-12-05 NOTE — Progress Notes (Signed)
Daily Progress Note   Patient Name: Marcus Kemp       Date: 12/05/2022 DOB: [birth year]  Age: 89 y.o. MRN#: [id number] Attending Physician: Arnetha Courser, MD Primary Care Physician: Marguarite Arbour, MD Admit Date: 11/30/2022  Reason for Consultation/Follow-up: Establishing goals of care  HPI/Brief Hospital Review: 89 y.o. 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   Familiar to PMT service as he was followed during his admission 08/2022.    Palliative medicine was consulted for assisting with goals of care conversations.   Subjective: Extensive chart review has been completed prior to meeting patient including labs, vital signs, imaging, progress notes, orders, and available advanced directive documents from current and previous encounters.     Visited with Marcus Kemp at his bedside. ROS and exam limited due to [facility]. Wife-Marcus Kemp and daughter-Marcus Kemp at bedside during time of visit. Family voiced concerns of congested cough and Marcus Kemp recalls a choking episode yesterday while he was eating lunch-shared these concerns with primary team-CXR and ST eval ordered  Family requested I return later in the day as they were going to lunch to celebrate Mother's Day. Unable to return to bedside prior to [place] leaving, called and spoke with her on phone. Marcus Kemp shares her concerns of her dad visible continued decline since last admission (08/2022). She fears and anticipates an ongoing decline. She is struggling with having conversations with her mother-Marcus Kemp and feels  she doesn't fully understand the severity of Marcus Kemp's illnesses. We discussed chronic disease trajectory and anticipated ongoing decline. We discussed overall philosophy of hospice, Marcus Kemp shares her mother-Marcus Kemp is not in a place to accept Hospice but understands this may be appropriate with ongoing decline.  Answered and addressed all questions and concerns. Shared PMT to follow for ongoing needs and support.  Objective:  Vital Signs: BP 104/62 (BP Location: Left Arm)   Pulse 77   Temp 98.4 F (36.9 C)   Resp 16   Wt 54 kg   SpO2 98%   BMI 19.21 kg/m  SpO2: SpO2: 98 % O2 Device: O2 Device: Room Air O2 Flow Rate: O2 Flow Rate (L/min): 2 L/min  Palliative Care Assessment & Plan   Assessment/Recommendation/Plan  DNR Ongoing GOC conversations needed PMT to continue to follow for ongoing needs and support  Thank you for allowing the Palliative Medicine Team to assist in the care of this patient.  Total time:  35 minutes  Time spent includes: Detailed review of medical records (labs, imaging, vital signs), medically appropriate exam (mental status, respiratory, cardiac, skin), discussed with treatment team, counseling and educating patient, family and staff, documenting clinical information, medication management and coordination of care.  Leeanne Deed, DNP, AGNP-C Palliative Medicine   Please contact Palliative Medicine Team phone at [phone] for questions and concerns.

## 2022-12-05 NOTE — Progress Notes (Signed)
[facility] CLINIC CARDIOLOGY CONSULT NOTE       Patient ID: Marcus Kemp MRN: [id number] DOB/AGE: [birth year] 89 y.o.  Admit date: 11/30/2022 Referring Physician Dr. Joylene Igo Primary Physician Dr. Judithann Sheen  Primary Cardiologist Dr. Darrold Junker Reason for Consultation acute on chronic HF   HPI: Marcus Kemp. Kemp "Marcus Kemp" is a 89yoM with a PMH of HFpEF (>55%), severe rheumatic MR, paroxysmal AF s/p unsuccessful DCCV 04/2016 (dose reduced eliquis), pulmonary HTN, hx PE (08/2022) who presented to [facility] ED 11/30/2022 with significant weight gain and peripheral edema despite increasing of the dose and frequency of his torsemide.  Cardiology is consulted for further assistance.  Interval history: -Patient resting comfortably in bed this morning, continues to deny chest pain, SOB.  -Net negative 5L since admission, Cr continues to improve. 1.26 this AM, baseline around 1.0. Remains on 2L [place].  -AF RVR noted on tele overnight, now in SR with HR in the 90s -BP stable in the low 100s systolic over last 24 hours.    Review of systems complete and found to be negative unless listed above     Past Medical History:  Diagnosis Date   A-fib (HCC)    a.) CHA2DS2-VASc = 4 (age x 2, CHF, aortic plaque). b.) s/p unsuccessful TEE cardioversion 04/28/2016 --> rec'd 200 J x1 and 360 J x 2. c.) rate/rhythm maintained on oral metoprolol succinante; chronically anticoagulated using dose reduced apixaban.   Anemia    Aortic atherosclerosis (HCC)    Arthritis    CHF (congestive heart failure) (HCC)    a.) TTE 04/26/2016: EF >55%; mod LVH; MVP; mild-mod LAE, mild MR. b.) TTE 05/14/209: EF >55%; mod RVE; mild BAE; mod MR.TR; triv AR/PR. c.) TTE 03/28/2019: EF >55%; mild LVH; mod BAE; mild PR, mod MR/TR.   GERD (gastroesophageal reflux disease)    Heart murmur    History of kidney stones    HOH (hard of hearing)    a.) hearing aid to LEFT ear.   Hyperlipidemia    Hypertension    Infrarenal abdominal aortic aneurysm  (AAA) without rupture (HCC) 05/05/2021   a.) CT 05/05/2021 --> measured 3.0 cm.   MVP (mitral valve prolapse)    Peripheral edema    Pneumonia    Pulmonary embolus (HCC)    Pulmonary HTN (HCC) 04/26/2016   a.) TTE 04/26/2016: EF >55%; PASP 45. b.) TTE 12/06/2017: EF >55%; RVSP 76.6 mmHg. c.) TTE 03/28/2019: EF >55%; RVSP 77.8 mmHg.   Renal cell carcinoma (HCC)    Rheumatic fever    Sepsis (HCC)    Valvular heart disease     Past Surgical History:  Procedure Laterality Date   INGUINAL HERNIA REPAIR Left 01/13/2022   Procedure: HERNIA REPAIR INGUINAL ADULT;  Surgeon: Earline Mayotte, MD;  Location: [facility] ORS;  Service: General;  Laterality: Left;   INSERTION OF MESH  01/13/2022   Procedure: INSERTION OF MESH;  Surgeon: Earline Mayotte, MD;  Location: [facility] ORS;  Service: General;;   LITHOTRIPSY     TEE WITH CARDIOVERSION N/A    P   TONSILLECTOMY      Medications Prior to Admission  Medication Sig Dispense Refill Last Dose   apixaban (ELIQUIS) 5 MG TABS tablet Take 5 mg by mouth 2 (two) times daily.   11/30/2022 at 0930   Azelastine HCl 137 MCG/SPRAY SOLN Place 1 spray into the nose 2 (two) times daily.   11/30/2022   cetirizine (ZYRTEC) 10 MG tablet SMARTSIG:1 pill By Mouth Daily PRN  unknown   empagliflozin (JARDIANCE) 10 MG TABS tablet Take 1 tablet (10 mg total) by mouth daily. 30 tablet 5 11/30/2022   fluorouracil (EFUDEX) 5 % cream Apply 1 Application topically 2 (two) times daily.   11/29/2022   metoprolol succinate (TOPROL-XL) 25 MG 24 hr tablet Take 1 tablet (25 mg total) by mouth every morning. 30 tablet 5 11/30/2022   midodrine (PROAMATINE) 5 MG tablet Take 5 mg by mouth as needed. For SBP >100   unknown   rOPINIRole (REQUIP) 1 MG tablet Take 1 mg by mouth at bedtime.   11/29/2022   torsemide (DEMADEX) 20 MG tablet 40mg  in the morning and 20mg  in the evening 90 tablet 3 11/30/2022   traZODone (DESYREL) 50 MG tablet Take 50 mg by mouth at bedtime.   11/29/2022   vitamin B-12  (CYANOCOBALAMIN) 500 MCG tablet Take 1 tablet (500 mcg total) by mouth daily. 90 tablet 3 11/30/2022    Social History   Socioeconomic History   Marital status: Married    Spouse name: Not on file   Number of children: Not on file   Years of education: Not on file   Highest education level: Not on file  Occupational History   Not on file  Tobacco Use   Smoking status: Former    Packs/day: 0.25    Years: 8.00    Additional pack years: 0.00    Total pack years: 2.00    Types: Cigarettes    Quit date: 37    Years since quitting: 84.4   Smokeless tobacco: Never  Vaping Use   Vaping Use: Never used  Substance and Sexual Activity   Alcohol use: No   Drug use: Never   Sexual activity: Not on file  Other Topics Concern   Not on file  Social History Narrative   Not on file   Social Determinants of Health   Financial Resource Strain: Not on file  Food Insecurity: No Food Insecurity (12/01/2022)   Hunger Vital Sign    Worried About Running Out of Food in the Last Year: Never true    Ran Out of Food in the Last Year: Never true  Transportation Needs: No Transportation Needs (12/01/2022)   PRAPARE - Administrator, Civil Service (Medical): No    Lack of Transportation (Non-Medical): No  Physical Activity: Not on file  Stress: Not on file  Social Connections: Not on file  Intimate Partner Violence: Not At Risk (12/01/2022)   Humiliation, Afraid, Rape, and Kick questionnaire    Fear of Current or Ex-Partner: No    Emotionally Abused: No    Physically Abused: No    Sexually Abused: No    Family History  Problem Relation Age of Onset   Heart disease Sister    Hypertension Other       Intake/Output Summary (Last 24 hours) at 12/05/2022 1137 Last data filed at 12/05/2022 0844 Gross per 24 hour  Intake 700 ml  Output 4300 ml  Net -3600 ml    Vitals:   12/04/22 2200 12/05/22 0415 12/05/22 0729 12/05/22 0956  BP: 100/75 105/72  100/61  Pulse: 99 70  89  Resp: 18  20    Temp: 98.1 F (36.7 C) 98 F (36.7 C)  98.4 F (36.9 C)  TempSrc: Oral     SpO2: 93% 97%  95%  Weight:   54 kg     PHYSICAL EXAM General: Elderly and chronically ill-appearing thin Caucasian male. Laying in bed  comfortably. HEENT:  Normocephalic and atraumatic. Hard of hearing. Neck:  No JVD.  Lungs: Normal respiratory effort on room air.  Decreased breath sounds with trace right basilar crackles. Heart: HRRR . Normal S1 and S2, 3/6 blowing holosystolic murmur best heard at the apex. Abdomen: Nontender to palpation, distended. Msk: Generalized weakness. Extremities: Chronic hyperpigmentation and woody edema, multiple excoriations and wounds with 2+ pitting bilaterally with overall improvement. Neuro: Fatigued, awakens easily to voice Psych:  Answers questions appropriately.   Labs: Basic Metabolic Panel: Recent Labs    12/04/22 0603 12/05/22 0529  NA 135 136  K 3.8 3.6  CL 98 96*  CO2 30 32  GLUCOSE 79 95  BUN 45* 36*  CREATININE 1.45* 1.26*  CALCIUM 8.4* 8.2*  MG 2.3  --    Liver Function Tests: No results for input(s): "AST", "ALT", "ALKPHOS", "BILITOT", "PROT", "ALBUMIN" in the last 72 hours. No results for input(s): "LIPASE", "AMYLASE" in the last 72 hours. CBC: Recent Labs    12/04/22 0603  WBC 6.0  HGB 11.6*  HCT 34.8*  MCV 101.2*  PLT 79*   Cardiac Enzymes: No results for input(s): "CKTOTAL", "CKMB", "CKMBINDEX", "TROPONINIHS" in the last 72 hours.  BNP: No results for input(s): "BNP" in the last 72 hours.  D-Dimer: No results for input(s): "DDIMER" in the last 72 hours. Hemoglobin A1C: No results for input(s): "HGBA1C" in the last 72 hours. Fasting Lipid Panel: No results for input(s): "CHOL", "HDL", "LDLCALC", "TRIG", "CHOLHDL", "LDLDIRECT" in the last 72 hours. Thyroid Function Tests: No results for input(s): "TSH", "T4TOTAL", "T3FREE", "THYROIDAB" in the last 72 hours.  Invalid input(s): "FREET3" Anemia Panel: No results for  input(s): "VITAMINB12", "FOLATE", "FERRITIN", "TIBC", "IRON", "RETICCTPCT" in the last 72 hours.   Radiology: US RENAL  Result Date: 11/30/2022 CLINICAL DATA:  Acute kidney injury EXAM: RENAL / URINARY TRACT ULTRASOUND COMPLETE COMPARISON:  Renal ultrasound 09/09/2022.  CT abdomen 12/24/2021 FINDINGS: Right Kidney: Renal measurements: 10.1 x 4.8 x 4.5 cm = volume: 115 mL. No hydronephrosis. Increased echogenicity is seen throughout. There is a 1.7 x 1.4 x 1.8 cm cyst in the upper pole. Left Kidney: Renal measurements: 10.3 x 4.9 x 4.1 cm = volume: 107 mL. No hydronephrosis. Normal echogenicity. Are 2 cysts identified in the upper pole measuring 2.1 x 1.0 x 2.0 cm and 1.1 x 1.0 x 1.2 cm. Bladder: Appears normal for degree of bladder distention. Other: There is trace free fluid around the liver and spleen. IMPRESSION: 1. No hydronephrosis. 2. Increased echogenicity in the right kidney is nonspecific but can be seen in the setting of medical renal disease. 3. Bilateral renal cysts. 4. Trace free fluid around the liver and spleen. Electronically Signed   By: Darliss Cheney M.D.   On: 11/30/2022 17:16   DG Chest 2 View  Result Date: 11/30/2022 CLINICAL DATA:  Shortness of breath. EXAM: CHEST - 2 VIEW COMPARISON:  October 31, 2022. FINDINGS: Stable cardiomegaly. Minimal bibasilar subsegmental atelectasis or edema is noted with minimal pleural effusions. Bony thorax is unremarkable. IMPRESSION: Minimal bibasilar subsegmental atelectasis or edema is noted with minimal bilateral pleural effusions. Aortic Atherosclerosis (ICD10-I70.0). Electronically Signed   By: Lupita Raider M.D.   On: 11/30/2022 11:46    ECHO 08/2022 1. Left ventricular ejection fraction, by estimation, is 55 to 60%. The  left ventricle has normal function. The left ventricle has no regional  wall motion abnormalities. There is moderate left ventricular hypertrophy.  Left ventricular diastolic  parameters are indeterminate. There  is the  interventricular septum is  flattened in systole and diastole, consistent with right ventricular  pressure and volume overload.   2. Right ventricular systolic function is mildly reduced. The right  ventricular size is moderately enlarged. There is severely elevated  pulmonary artery systolic pressure. The estimated right ventricular  systolic pressure is 70.4 mmHg.   3. Left atrial size was moderately dilated.   4. Right atrial size was mildly dilated.   5. The mitral valve is normal in structure. Moderate to severe mitral  valve regurgitation. No evidence of mitral stenosis.   6. Tricuspid valve regurgitation is moderate to severe.   7. The aortic valve has an indeterminant number of cusps. Aortic valve  regurgitation is not visualized. Aortic valve sclerosis/calcification is  present, without any evidence of aortic stenosis.   8. There is borderline dilatation of the ascending aorta, measuring 36  mm.   9. The inferior vena cava is dilated in size with <50% respiratory  variability, suggesting right atrial pressure of 15 mmHg.   TELEMETRY reviewed by me [facility]) 12/05/2022 : Converted to atrial fibrillation with RVR overnight, currently in SR with rates in the 90s  EKG reviewed by me: Sinus rhythm first-degree AV block nonspecific T wave abnormality  Data reviewed by me [facility]) 12/05/2022: Hospitalist progress note, nursing notes last 24h vitals tele labs imaging I/O   Principal Problem:   Acute on chronic heart failure with preserved ejection fraction (HFpEF) (HCC) Active Problems:   Longstanding persistent atrial fibrillation (HCC)   Rheumatic mitral regurgitation   Macrocytic anemia   Pulmonary embolism (HCC)   Thrombocytopenia (HCC)   Hematuria   AKI (acute kidney injury) (HCC)   Hypotension   Hypokalemia    ASSESSMENT AND PLAN:  Marcus Karvonen. Kemp "Marcus Kemp" is a 89yoM with a PMH of HFpEF (>55%), severe rheumatic MR, paroxysmal AF s/p unsuccessful DCCV 04/2016 (dose reduced  eliquis), pulmonary HTN, hx PE (08/2022) who presented to [facility] ED 11/30/2022 with significant weight gain and peripheral edema despite increasing of the dose and frequency of his torsemide.  Cardiology is consulted for further assistance.  # Acute on chronic HFpEF # Severe rheumatic MR Presents with significant peripheral edema extending from his feet up into his groin and abdomen despite adjustment of his torsemide on an outpatient basis, limiting his ability to ambulate due to the weight of his legs. Dietary indiscretions with potato chips likely contributory, but family does their best to ensure that the patient appears to his salt and fluid restricted diet.  On admission BNP elevated to 1800 and chest x-ray with pleural effusions.  He is clinically hypervolemic on exam today, although with clinical improvement after initial diuresis. Weight down 10 kg since 5/8.  -S/p IV Lasix 40 mg x 1 in ED -Continue Lasix drip at 4 mg/h as BP is low normal. Reassess tomorrow. -Wean O2 as tolerated.  -Hold metoprolol for now with borderline blood pressures -Will give K this morning. Monitor and replenish electrolytes for a goal K >4, mag >2 -Additional GDMT limited by hypotension and renal insufficiency -Strict salt and fluid restriction, strict I/O's -Defer additional cardiac diagnostics at this time. -Discussion with palliative care and family is planned for later today to discuss GOC.  # Paroxysmal atrial fibrillation In sinus rhythm on telemetry this AM, did convert to AF RVR overnight. Continue Eliquis 5 mg twice daily for stroke prevention, and with history of recent pulmonary embolus in February 2024.  Note for signs/symptoms of bleeding, as he  has a history of complications with full dose Eliquis  # AKI Renal function fluctuating with diuresis from creatinine 1.74 on admission to 1.26 today, current GFR 53. Monitor closely.  # Demand ischemia Borderline elevated at 59, which in the absence of chest  pain or EKG changes in the setting of acute on chronic HFpEF as above, this is most consistent with demand/supply mismatch and not ACS   This patient's plan of care was discussed and created with Dr. Darrold Junker and he is in agreement.  Signed: Gale Journey , PA-C 12/05/2022, 11:37 AM [facility] Cardiology

## 2022-12-05 NOTE — TOC Progression Note (Signed)
Transition of Care [facility]) - Progression Note    Patient Details  Name: Marcus Kemp MRN: [id number] Date of Birth: 09/26/1924  Transition of Care [facility]) CM/SW Contact  Kemper Durie, RN Phone Number: 12/05/2022, 3:23 PM  Clinical Narrative:     Spoke with wife at bedside (patient receiving chest x-ray) to discuss recommendations for HHPT/OT.  She is reluctant, stating that patient does well using walker at home.  Advised of benefits to have additional support to build strength, she remains reluctant but agrees to have someone reach out.  State "I might tell them don't come when they call."  [facility] (spoke to [place]), they will follow up for [facility] needs when patient medically ready for discharge.   Expected Discharge Plan: Home w Home Health Services Barriers to Discharge: Continued Medical Work up  Expected Discharge Plan and Services     Post Acute Care Choice:  (TBD) Living arrangements for the past 2 months: Single Family Home                                       Social Determinants of Health (SDOH) Interventions SDOH Screenings   Food Insecurity: No Food Insecurity (12/01/2022)  Housing: Low Risk  (12/01/2022)  Transportation Needs: No Transportation Needs (12/01/2022)  Utilities: Not At Risk (12/01/2022)  Tobacco Use: Medium Risk (12/01/2022)    Readmission Risk Interventions     No data to display

## 2022-12-05 NOTE — Progress Notes (Signed)
Occupational Therapy Treatment Patient Details Name: Marcus Kemp MRN: [id number] DOB: Feb 03, 1925 Today's Date: 12/05/2022   History of present illness Pt admitted for acute/chronic heart failure with preserved EF and complaints of B LE swelling/weakness. HIstory includes heart failure, pulm hyptension, HTN, Afib, renal cell carcinoma, and reheumatic fever.   OT comments  Patient received supine in bed and asleep upon arrival. Wife reported pt going for chest CT earlier today due to coughing/choking episode (RN cleared pt for participation in therapy). Pt required multimodal cues for alertness initially (also likely due to very [facility]). He reported being "sleepy", but agreeable to bed level activity including grooming tasks and BUE AROM exercises while sitting up in bed. Pt endorsed mild pain in buttocks from lying in bed. Pt completed rolling with Min A and OT provided pillows for optimal positioning at end of session. Pt left with all needs in reach. Pt is making progress toward goal completion. D/C recommendation remains appropriate. OT will continue to follow acutely.    Recommendations for follow up therapy are one component of a multi-disciplinary discharge planning process, led by the attending physician.  Recommendations may be updated based on patient status, additional functional criteria and insurance authorization.    Assistance Recommended at Discharge Intermittent Supervision/Assistance  Patient can return home with the following  A little help with walking and/or transfers;A little help with bathing/dressing/bathroom;Assistance with cooking/housework;Direct supervision/assist for medications management;Direct supervision/assist for financial management;Assist for transportation;Help with stairs or ramp for entrance   Equipment Recommendations  None recommended by OT    Recommendations for Other Services      Precautions / Restrictions Precautions Precautions:  Fall Restrictions Weight Bearing Restrictions: No       Mobility Bed Mobility Overal bed mobility: Needs Assistance Bed Mobility: Rolling Rolling: Min assist         General bed mobility comments: pt completed rolling to the R side in order for therapist to position pillows for offloading bottom    Transfers Overall transfer level: Needs assistance                 General transfer comment: deferred this date due to fatigue/lethargy     Balance         ADL either performed or assessed with clinical judgement   ADL Overall ADL's : Needs assistance/impaired     Grooming: Set up;Bed level;Wash/dry face              Extremity/Trunk Assessment Upper Extremity Assessment Upper Extremity Assessment: Generalized weakness   Lower Extremity Assessment Lower Extremity Assessment: Generalized weakness        Vision Baseline Vision/History: 1 Wears glasses (readers only) Ability to See in Adequate Light: 0 Adequate Patient Visual Report: No change from baseline     Perception     Praxis      Cognition Arousal/Alertness: Lethargic Behavior During Therapy: WFL for tasks assessed/performed, Flat affect Overall Cognitive Status: Within Functional Limits for tasks assessed           General Comments: very [facility]        Exercises General Exercises - Upper Extremity Shoulder Flexion: AROM, Strengthening, Both, 10 reps, Seated (to 90 deg) Elbow Flexion: AROM, Strengthening, Both, 10 reps, Seated Elbow Extension: AROM, Strengthening, Both, 10 reps, Seated    Shoulder Instructions       General Comments      Pertinent Vitals/ Pain       Pain Assessment Pain Assessment: Faces Faces Pain Scale: Hurts a little  bit Pain Location: buttocks Pain Descriptors / Indicators: Aching, Sore Pain Intervention(s): Monitored during session, Limited activity within patient's tolerance, Repositioned  Home Living                   Prior  Functioning/Environment              Frequency  Min 2X/week        Progress Toward Goals  OT Goals(current goals can now be found in the care plan section)  Progress towards OT goals: Progressing toward goals  Acute Rehab OT Goals Patient Stated Goal: Get better; go home OT Goal Formulation: With patient Time For Goal Achievement: 12/15/22 Potential to Achieve Goals: Good  Plan Discharge plan remains appropriate;Frequency remains appropriate    Co-evaluation                 AM-PAC OT "6 Clicks" Daily Activity     Outcome Measure   Help from another person eating meals?: None Help from another person taking care of personal grooming?: A Little Help from another person toileting, which includes using toliet, bedpan, or urinal?: A Little Help from another person bathing (including washing, rinsing, drying)?: A Little Help from another person to put on and taking off regular upper body clothing?: A Little Help from another person to put on and taking off regular lower body clothing?: A Little 6 Click Score: 19    End of Session    OT Visit Diagnosis: Unsteadiness on feet (R26.81);Muscle weakness (generalized) (M62.81)   Activity Tolerance Patient limited by fatigue;Patient limited by lethargy   Patient Left in bed;with call bell/phone within reach;with bed alarm set;with family/visitor present   Nurse Communication Mobility status        Time: 1610-9604 OT Time Calculation (min): 14 min  Charges: OT General Charges $OT Visit: 1 Visit OT Treatments $Self Care/Home Management : 8-22 mins  [facility] MS, OTR/L ascom [phone]  12/05/22, 5:27 PM

## 2022-12-05 NOTE — Progress Notes (Signed)
PROGRESS NOTE  Marcus Kemp  JWJ:[id number] DOB: 1925/05/15 DOA: 11/30/2022 PCP: Marguarite Arbour, MD   Brief Narrative: Patient is a 89 year old male with history of heart failure with preserved ejection fraction, recent PE on Eliquis, pulmonary hypertension, persistent A-fib, renal cell carcinoma, rheumatic heart disease who presented to the emergency department from home with complaint of bilateral lower extremity swelling, generalized weakness, dyspnea on exertion.  On presentation, she was found to be hypotensive.  Lab work showed creatinine of 1.7, BNP of 1813.  Chest x-ray showed features of pulmonary edema, minimal bilateral pleural effusion.  Cardiology consulted.  Currently on IV Lasix.  5/11:Hemodynamically stable, no urinary output recorded.  Cardiology would like to continue Lasix infusion for another day and then might switch to p.o.  Renal functions continue to improve.  5/12: Hemodynamically stable.  Wife concern about a choking spell yesterday with increased coughing today.  Improving renal function so cardiology would like to continue Lasix infusion for another day.  Good UOP and improvement in weight.  Chest x-ray and swallow evaluation ordered  Assessment & Plan:  Principal Problem:   Acute on chronic heart failure with preserved ejection fraction (HFpEF) (HCC) Active Problems:   AKI (acute kidney injury) (HCC)   Hypotension   Rheumatic mitral regurgitation   Longstanding persistent atrial fibrillation (HCC)   Macrocytic anemia   Pulmonary embolism (HCC)   Thrombocytopenia (HCC)   Hematuria   Hypokalemia  Acute on chronic diastolic congestive heart failure: Presented with gradual worsening of lower extremity edema, weight gain.  Takes torsemide 40 mg twice a day at home.  Recent echo in February 2024 showed EF of 55 to 60%, moderate LVH, mildly reduced RV systolic function, severely elevated pulmonary systolic pressure.  Currently on Lasix drip.  Cardiology  following  AKI on CKD stage IIIa: Likely from cardiorenal syndrome from acute on chronic CHF.  Kidney function improving with Lasix monitor input/output, daily weight.  Creatinine on 10/27/2022 was 1.26  Acute hypoxic respiratory failure: This is most likely secondary to volume overload/pulmonary edema on admission.  Lungs were almost clear on auscultation today.  Does not use oxygen at home.   Able to wean back to room air  Chronic hypotension: Currently on midodrine  Rheumatic heart disease: History of rheumatic fever .  Has some moderate to severe MR as per echo.  She is not a surgical candidate  Persistent A-fib: Currently rate is controlled.  On Eliquis and metoprolol.  Monitor on telemetry.  Currently in normal sinus rhythm  Microscopic hematuria: Noted on urinalysis.  We recommend follow-up with urology as an outpatient if needed  Thrombocytopenia: Mild.  Stable, chronic.  Continue to monitor  History of PE: On Eliquis  Microcytic anemia: Hemoglobin stable  Hypokalemia: Continue to monitor and supplement as needed  Goals of care: Elderly patient with diastolic heart failure,on iv drip lasix.DNR. Goals of care discussion will be helpful. Ambulates with walker ,lives with family Plan is to go go home with palliative care following as outpatient.      DVT prophylaxis: apixaban (ELIQUIS) tablet 5 mg     Code Status: DNR  Family Communication: Discussed with wife at bedside  Patient status:Inpatient  Patient is from :Home  Anticipated discharge AO:ZHYQ  Estimated DC date:2-3 days,needs cardiology clearance   Consultants: Cardiology  Procedures:None  Antimicrobials:  Anti-infectives (From admission, onward)    None       Subjective: Patient was seen and examined today.  Wife is concerned that he is  coughing more than his normal, apparently had a choking incidents yesterday.  Objective: Vitals:   12/05/22 0415 12/05/22 0729 12/05/22 0956 12/05/22 1239  BP:  105/72  100/61 96/60  Pulse: 70  89 89  Resp: 20   18  Temp: 98 F (36.7 C)  98.4 F (36.9 C) 98.1 F (36.7 C)  TempSrc:      SpO2: 97%  95% 96%  Weight:  54 kg      Intake/Output Summary (Last 24 hours) at 12/05/2022 1426 Last data filed at 12/05/2022 1254 Gross per 24 hour  Intake 700 ml  Output 4900 ml  Net -4200 ml    Filed Weights   12/03/22 0855 12/04/22 0713 12/05/22 0729  Weight: 60.8 kg 57.7 kg 54 kg    Examination:  General.  Frail elderly man, in no acute distress. Pulmonary.  Lungs clear bilaterally, normal respiratory effort. CV.  Regular rate and rhythm, no JVD, rub or murmur. Abdomen.  Soft, nontender, nondistended, BS positive. CNS.  Alert and oriented .  No focal neurologic deficit. Extremities.  Trace LE edema, no cyanosis, pulses intact and symmetrical. Psychiatry.  Judgment and insight appears normal.   Data Reviewed: I have personally reviewed following labs and imaging studies  CBC: Recent Labs  Lab 11/30/22 1051 12/01/22 0522 12/04/22 0603  WBC 5.0 4.9 6.0  NEUTROABS  --  2.9  --   HGB 12.0* 11.8* 11.6*  HCT 37.0* 35.8* 34.8*  MCV 104.8* 103.5* 101.2*  PLT 105* 86* 79*    Basic Metabolic Panel: Recent Labs  Lab 12/01/22 0522 12/02/22 0311 12/03/22 0514 12/04/22 0603 12/05/22 0529  NA 140 135 135 135 136  K 3.3* 4.3 3.7 3.8 3.6  CL 102 95* 97* 98 96*  CO2 30 34* 28 30 32  GLUCOSE 74 85 80 79 95  BUN 42* 47* 44* 45* 36*  CREATININE 1.41* 1.70* 1.56* 1.45* 1.26*  CALCIUM 7.7* 8.7* 8.3* 8.4* 8.2*  MG 2.3  --   --  2.3  --       No results found for this or any previous visit (from the past 240 hour(s)).   Radiology Studies: No results found.  Scheduled Meds:  apixaban  5 mg Oral BID   midodrine  5 mg Oral TID WC   rOPINIRole  1 mg Oral QHS   traZODone  50 mg Oral QHS   Continuous Infusions:  furosemide (LASIX) 200 mg in dextrose 5 % 100 mL (2 mg/mL) infusion 4 mg/hr (12/04/22 1305)     LOS: 5 days   Arnetha Courser, MD [facility] P5/06/2023, 2:26 PM

## 2022-12-06 DIAGNOSIS — Z7189 Other specified counseling: Secondary | ICD-10-CM

## 2022-12-06 DIAGNOSIS — I5033 Acute on chronic diastolic (congestive) heart failure: Secondary | ICD-10-CM | POA: Diagnosis not present

## 2022-12-06 DIAGNOSIS — Z515 Encounter for palliative care: Secondary | ICD-10-CM

## 2022-12-06 DIAGNOSIS — I4811 Longstanding persistent atrial fibrillation: Secondary | ICD-10-CM | POA: Diagnosis not present

## 2022-12-06 DIAGNOSIS — N179 Acute kidney failure, unspecified: Secondary | ICD-10-CM | POA: Diagnosis not present

## 2022-12-06 LAB — PROCALCITONIN: Procalcitonin: <0.1 ng/mL

## 2022-12-06 LAB — BASIC METABOLIC PANEL
Anion gap: 12 (ref 5–15)
BUN: 32 mg/dL — ABNORMAL HIGH (ref 8–23)
CO2: 31 mmol/L (ref 22–32)
Calcium: 8.5 mg/dL — ABNORMAL LOW (ref 8.9–10.3)
Chloride: 93 mmol/L — ABNORMAL LOW (ref 98–111)
Creatinine, Ser: 1.15 mg/dL (ref 0.61–1.24)
GFR, Estimated: 59 mL/min — ABNORMAL LOW (ref >60)
Glucose, Bld: 79 mg/dL (ref 70–99)
Potassium: 3.8 mmol/L (ref 3.5–5.1)
Sodium: 136 mmol/L (ref 135–145)

## 2022-12-06 MED ORDER — POTASSIUM CHLORIDE 20 MEQ PO PACK
40.0000 meq | PACK | Freq: Once | ORAL | Status: AC
  Administered 2022-12-06: 40 meq via ORAL
  Filled 2022-12-06: qty 2

## 2022-12-06 MED ORDER — LEVOFLOXACIN 500 MG PO TABS
750.0000 mg | ORAL_TABLET | ORAL | Status: DC
  Administered 2022-12-06: 750 mg via ORAL

## 2022-12-06 MED ORDER — SALINE SPRAY 0.65 % NA SOLN: 1.0000 | NASAL | Status: DC | PRN

## 2022-12-06 MED ORDER — TORSEMIDE 20 MG PO TABS
40.0000 mg | ORAL_TABLET | Freq: Every morning | ORAL | Status: DC
  Administered 2022-12-07: 40 mg via ORAL
  Filled 2022-12-06: qty 2

## 2022-12-06 MED ORDER — TORSEMIDE 20 MG PO TABS: 20.0000 mg | ORAL_TABLET | Freq: Every evening | ORAL | Status: DC

## 2022-12-06 MED ORDER — METRONIDAZOLE 500 MG PO TABS
500.0000 mg | ORAL_TABLET | Freq: Two times a day (BID) | ORAL | Status: DC
  Administered 2022-12-06 (×2): 500 mg via ORAL
  Filled 2022-12-06 (×3): qty 1

## 2022-12-06 NOTE — Progress Notes (Signed)
Physical Therapy Treatment Patient Details Name: Marcus Kemp MRN: [id number] DOB: [birth year] Today's Date: 12/06/2022   History of Present Illness Pt admitted for acute/chronic heart failure with preserved EF and complaints of B LE swelling/weakness. HIstory includes heart failure, pulm hyptension, HTN, Afib, renal cell carcinoma, and reheumatic fever.    PT Comments    Improved tolerance for activity compared to 12/03/22 when pt required 2L O2 to maintain sats above 90%. Today pt demonstrated increased gait distance with RW and less fatigue, O2 sats 98% at rest on RA, 92% upon completion of gait training. Pt continues with coughing, however now productive with yellow sputum. Will continue to progress per POC.    Recommendations for follow up therapy are one component of a multi-disciplinary discharge planning process, led by the attending physician.  Recommendations may be updated based on patient status, additional functional criteria and insurance authorization.  Follow Up Recommendations       Assistance Recommended at Discharge Set up Supervision/Assistance  Patient can return home with the following A little help with walking and/or transfers   Equipment Recommendations  None recommended by PT    Recommendations for Other Services       Precautions / Restrictions Precautions Precautions: Fall Precaution Comments:  (Very HOH) Restrictions Weight Bearing Restrictions: No     Mobility  Bed Mobility Overal bed mobility: Needs Assistance Bed Mobility: Supine to Sit     Supine to sit: Min guard, HOB elevated     General bed mobility comments:  (No c/o SOB at EOB)    Transfers Overall transfer level: Needs assistance Equipment used: Rolling walker (2 wheels) Transfers: Sit to/from Stand Sit to Stand: Min guard           General transfer comment:  (~30 seconds to gaine balance while standing at [employer])    Ambulation/Gait Ambulation/Gait assistance: Min  guard Gait Distance (Feet):  (85) Assistive device: Rolling walker (2 wheels) Gait Pattern/deviations: Step-through pattern Gait velocity: decreased     General Gait Details: ambulated using RW on RA with SpO2 at 92% upon completion, less fatigue this session   Stairs             Wheelchair Mobility    Modified Rankin (Stroke Patients Only)       Balance Overall balance assessment: Needs assistance Sitting-balance support: Feet supported Sitting balance-Leahy Scale: Good     Standing balance support: Bilateral upper extremity supported, Reliant on assistive device for balance Standing balance-Leahy Scale: Fair                              Cognition Arousal/Alertness: Awake/alert Behavior During Therapy: WFL for tasks assessed/performed, Flat affect Overall Cognitive Status: Within Functional Limits for tasks assessed                                 General Comments: very HOH        Exercises      General Comments        Pertinent Vitals/Pain Pain Assessment Pain Assessment: No/denies pain    Home Living                          Prior Function            PT Goals (current goals can now be found in the care plan section)  Acute Rehab PT Goals Patient Stated Goal: to have less swelling Progress towards PT goals: Progressing toward goals    Frequency    Min 2X/week      PT Plan Current plan remains appropriate    Co-evaluation              AM-PAC PT "6 Clicks" Mobility   Outcome Measure  Help needed turning from your back to your side while in a flat bed without using bedrails?: None Help needed moving from lying on your back to sitting on the side of a flat bed without using bedrails?: A Little Help needed moving to and from a bed to a chair (including a wheelchair)?: A Little Help needed standing up from a chair using your arms (e.g., wheelchair or bedside chair)?: A Little Help needed to  walk in hospital room?: A Little Help needed climbing 3-5 steps with a railing? : A Little 6 Click Score: 19    End of Session Equipment Utilized During Treatment: Gait belt Activity Tolerance: Patient tolerated treatment well Patient left: in chair;with call bell/phone within reach;with family/visitor present Nurse Communication: Mobility status PT Visit Diagnosis: Muscle weakness (generalized) (M62.81);Difficulty in walking, not elsewhere classified (R26.2)     Time: 1300-1320 PT Time Calculation (min) (ACUTE ONLY): 20 min  Charges:  $Therapeutic Activity: 8-22 mins                    Zadie Cleverly, PTA  [name] 12/06/2022, 1:25 PM

## 2022-12-06 NOTE — Progress Notes (Signed)
Daily Progress Note   Patient Name: Marcus Kemp       Date: 12/06/2022 DOB: 1924/11/19  Age: 89 y.o. MRN#: [id number] Attending Physician: Arnetha Courser, MD Primary Care Physician: Marguarite Arbour, MD Admit Date: 11/30/2022  Reason for Consultation/Follow-up: Establishing goals of care  Subjective: Notes and labs reviewed.  In to see patient.  He is sitting in bedside chair asleep, and intermittently opens his eyes to cough and then closes them again.  Wife is at bedside.  Wife is able to articulate his current status and plans moving forward.  She states she is hopeful that he will be able to tolerate oral diuretic therapy.  She states she is hopeful "that he learned his lesson" and will be more careful with his oral intake.  She discusses that he has always liked salt ever since they were married 64 years ago.  She states he is always like salty foods.  Discussed quality of life and dietary changes, and wife states that she is already removed all salty things from the home, and that he will just have to make some lifestyle modifications, and accept them.  Attempted to broach concerns for prognosis, and wife voices being hopeful for his discharge home and continued improvement.  Length of Stay: 6  Current Medications: Scheduled Meds:   apixaban  5 mg Oral BID   levofloxacin  750 mg Oral Q48H   metroNIDAZOLE  500 mg Oral Q12H   midodrine  5 mg Oral TID WC   rOPINIRole  1 mg Oral QHS   [START ON 12/07/2022] torsemide  20 mg Oral QPM   [START ON 12/07/2022] torsemide  40 mg Oral q AM   traZODone  50 mg Oral QHS    Continuous Infusions:   PRN Meds: acetaminophen, ondansetron (ZOFRAN) IV, mouth rinse, sodium chloride  Physical Exam Constitutional:      Comments: Opens eyes  intermittently  Pulmonary:     Effort: Pulmonary effort is normal.             Vital Signs: BP (!) 109/53 (BP Location: Left Arm)   Pulse 72   Temp 97.7 F (36.5 C) (Oral)   Resp 20   Wt 54.9 kg   SpO2 96%   BMI 19.53 kg/m  SpO2: SpO2: 96 % O2 Device: O2 Device:  Room Air O2 Flow Rate: O2 Flow Rate (L/min): 2 L/min  Intake/output summary:  Intake/Output Summary (Last 24 hours) at 12/06/2022 1549 Last data filed at 12/06/2022 1107 Gross per 24 hour  Intake 540 ml  Output 2200 ml  Net -1660 ml   LBM: Last BM Date : 12/04/22 Baseline Weight: Weight: 64.3 kg Most recent weight: Weight: 54.9 kg         Patient Active Problem List   Diagnosis Date Noted   Hypokalemia 12/01/2022   Acute on chronic heart failure with preserved ejection fraction (HFpEF) (HCC) 11/30/2022   Thrombocytopenia (HCC) 11/30/2022   Hematuria 11/30/2022   AKI (acute kidney injury) (HCC) 11/30/2022   Hypotension 11/30/2022   Pulmonary embolism (HCC) 09/10/2022   CHF (congestive heart failure) (HCC) 09/09/2022   Acute respiratory failure with hypoxia (HCC) 09/09/2022   Anemia 01/27/2022   Hyperlipidemia 01/27/2022   MVP (mitral valve prolapse) 01/27/2022   Nephrolithiasis 01/27/2022   Rheumatic fever 01/27/2022   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   Anasarca 01/27/2022   Macrocytic anemia 01/27/2022   Rheumatic tricuspid valve regurgitation 09/19/2017   Status post catheter ablation of atrial flutter 07/13/2017   Jugular venous distension 07/12/2017   Peripheral edema 01/31/2017   Acute on chronic diastolic CHF (congestive heart failure) (HCC) 09/24/2016   Longstanding persistent atrial fibrillation (HCC) 05/11/2016   Epidural abscess 04/25/2016   Muscle abscess 04/25/2016   Osteomyelitis of cervical spine (HCC) 04/25/2016   Staphylococcus aureus bacteremia with sepsis (HCC) 04/25/2016   Sepsis (HCC) 04/22/2016   Community acquired pneumonia 04/22/2016    Palliative Care Assessment  & Plan     Recommendations/Plan: Continue current care  Code Status:    Code Status Orders  (From admission, onward)           Start     Ordered   11/30/22 1418  Do not attempt resuscitation (DNR)  Continuous       Question Answer Comment  If patient has no pulse and is not breathing Do Not Attempt Resuscitation   If patient has a pulse and/or is breathing: Medical Treatment Goals LIMITED ADDITIONAL INTERVENTIONS: Use medication/IV fluids and cardiac monitoring as indicated; Do not use intubation or mechanical ventilation (DNI), also provide comfort medications.  Transfer to Progressive/Stepdown as indicated, avoid Intensive Care.   Consent: Discussion documented in EHR or advanced directives reviewed      11/30/22 1418           Code Status History     Date Active Date Inactive Code Status Order ID Comments User Context   09/13/2022 1008 09/15/2022 1755 DNR [id number]  Theotis Burrow, NP Inpatient   09/09/2022 1742 09/13/2022 1008 Full Code [id number]  Gillis Santa, MD ED   01/27/2022 2320 01/28/2022 1823 Full Code [id number]  Gertha Calkin, MD ED   04/22/2016 1512 04/23/2016 1240 Full Code [id number]  Hower, Cletis [place], MD ED      Advance Directive Documentation    Flowsheet Row Most Recent Value  Type of Advance Directive Healthcare Power of Attorney  Pre-existing out of facility DNR order (yellow form or pink MOST form) --  "MOST" Form in Place? --       Thank you for allowing the Palliative Medicine Team to assist in the care of this patient.   Morton Stall, NP  Please contact Palliative Medicine Team phone at [phone] for questions and concerns.

## 2022-12-06 NOTE — Progress Notes (Signed)
[facility] CLINIC CARDIOLOGY CONSULT NOTE       Patient ID: Marcus Kemp MRN: [id number] DOB/AGE: 1925-06-23 89 y.o.  Admit date: 11/30/2022 Referring Physician Dr. Joylene Igo Primary Physician Dr. Judithann Sheen  Primary Cardiologist Dr. 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  Cardiology is consulted for further assistance.  Interval history: -Sitting upright in recliner after ambulating with physical therapy, per PT he did well, walked further than late last week and was stable on room air -Peripheral edema greatly improved back to baseline, renal function also normalized -Main issue is congested cough no chest pain or shortness of breath. -Repeat chest x-ray today with new left mid and lower consolidation C/F aspiration pneumonia -Seen by speech therapy today who recommended strict aspiration precautions, meds administered in pure as able -Net IO Since Admission: -7,728.89 mL [12/06/22 1522]    Review of systems complete and found to be negative unless listed above     Past Medical History:  Diagnosis Date   A-fib (HCC)    a.) CHA2DS2-VASc = 4 (age x 2, CHF, aortic plaque). b.) s/p unsuccessful TEE cardioversion 04/28/2016 --> rec'd 200 J x1 and 360 J x 2. c.) rate/rhythm maintained on oral metoprolol succinante; chronically anticoagulated using dose reduced apixaban.   Anemia    Aortic atherosclerosis (HCC)    Arthritis    CHF (congestive heart failure) (HCC)    a.) TTE 04/26/2016: EF >55%; mod LVH; MVP; mild-mod LAE, mild MR. b.) TTE 05/14/209: EF >55%; mod RVE; mild BAE; mod MR.TR; triv AR/PR. c.) TTE 03/28/2019: EF >55%; mild LVH; mod  BAE; mild PR, mod MR/TR.   GERD (gastroesophageal reflux disease)    Heart murmur    History of kidney stones    HOH (hard of hearing)    a.) hearing aid to LEFT ear.   Hyperlipidemia    Hypertension    Infrarenal abdominal aortic aneurysm (AAA) without rupture (HCC) 05/05/2021   a.) CT 05/05/2021 --> measured 3.0 cm.   MVP (mitral valve prolapse)    Peripheral edema    Pneumonia    Pulmonary embolus (HCC)    Pulmonary HTN (HCC) 04/26/2016   a.) TTE 04/26/2016: EF >55%; PASP 45. b.) TTE 12/06/2017: EF >55%; RVSP 76.6 mmHg. c.) TTE 03/28/2019: EF >55%; RVSP 77.8 mmHg.   Renal cell carcinoma (HCC)    Rheumatic fever    Sepsis (HCC)    Valvular heart disease     Past Surgical History:  Procedure Laterality Date   INGUINAL HERNIA REPAIR Left 01/13/2022   Procedure: HERNIA REPAIR INGUINAL ADULT;  Surgeon: Earline Mayotte, MD;  Location: [facility] ORS;  Service: General;  Laterality: Left;   INSERTION OF MESH  01/13/2022   Procedure: INSERTION OF MESH;  Surgeon: Earline Mayotte, MD;  Location: [facility] ORS;  Service: General;;   LITHOTRIPSY     TEE WITH CARDIOVERSION N/A    P   TONSILLECTOMY      Medications Prior to Admission  Medication Sig Dispense Refill Last Dose   apixaban (ELIQUIS) 5 MG TABS tablet Take 5 mg by mouth 2 (two) times daily.   11/30/2022 at 0930   Azelastine HCl 137 MCG/SPRAY SOLN  Place 1 spray into the nose 2 (two) times daily.   11/30/2022   cetirizine (ZYRTEC) 10 MG tablet SMARTSIG:1 pill By Mouth Daily PRN   unknown   empagliflozin (JARDIANCE) 10 MG TABS tablet Take 1 tablet (10 mg total) by mouth daily. 30 tablet 5 11/30/2022   fluorouracil (EFUDEX) 5 % cream Apply 1 Application topically 2 (two) times daily.   11/29/2022   metoprolol succinate (TOPROL-XL) 25 MG 24 hr tablet Take 1 tablet (25 mg total) by mouth every morning. 30 tablet 5 11/30/2022   midodrine (PROAMATINE) 5 MG tablet Take 5 mg by mouth as needed. For SBP >100   unknown   rOPINIRole (REQUIP) 1 MG tablet  Take 1 mg by mouth at bedtime.   11/29/2022   torsemide (DEMADEX) 20 MG tablet 40mg  in the morning and 20mg  in the evening 90 tablet 3 11/30/2022   traZODone (DESYREL) 50 MG tablet Take 50 mg by mouth at bedtime.   11/29/2022   vitamin B-12 (CYANOCOBALAMIN) 500 MCG tablet Take 1 tablet (500 mcg total) by mouth daily. 90 tablet 3 11/30/2022    Social History   Socioeconomic History   Marital status: Married    Spouse name: Not on file   Number of children: Not on file   Years of education: Not on file   Highest education level: Not on file  Occupational History   Not on file  Tobacco Use   Smoking status: Former    Packs/day: 0.25    Years: 8.00    Additional pack years: 0.00    Total pack years: 2.00    Types: Cigarettes    Quit date: 33    Years since quitting: 84.4   Smokeless tobacco: Never  Vaping Use   Vaping Use: Never used  Substance and Sexual Activity   Alcohol use: No   Drug use: Never   Sexual activity: Not on file  Other Topics Concern   Not on file  Social History Narrative   Not on file   Social Determinants of Health   Financial Resource Strain: Not on file  Food Insecurity: No Food Insecurity (12/01/2022)   Hunger Vital Sign    Worried About Running Out of Food in the Last Year: Never true    Ran Out of Food in the Last Year: Never true  Transportation Needs: No Transportation Needs (12/01/2022)   PRAPARE - Administrator, Civil Service (Medical): No    Lack of Transportation (Non-Medical): No  Physical Activity: Not on file  Stress: Not on file  Social Connections: Not on file  Intimate Partner Violence: Not At Risk (12/01/2022)   Humiliation, Afraid, Rape, and Kick questionnaire    Fear of Current or Ex-Partner: No    Emotionally Abused: No    Physically Abused: No    Sexually Abused: No    Family History  Problem Relation Age of Onset   Heart disease Sister    Hypertension Other       Intake/Output Summary (Last 24 hours) at 12/06/2022  1519 Last data filed at 12/06/2022 1107 Gross per 24 hour  Intake 540 ml  Output 2200 ml  Net -1660 ml     Vitals:   12/06/22 0350 12/06/22 0829 12/06/22 1105 12/06/22 1512  BP: 102/60 102/60 (!) 90/59 (!) 109/53  Pulse: 80 83 92 72  Resp: 15 18 20 20   Temp: 97.8 F (36.6 C) 98.2 F (36.8 C) 97.6 F (36.4 C) 97.7 F (36.5 C)  TempSrc:   Oral Oral  SpO2: 92% 96% 97% 96%  Weight:  54.9 kg      PHYSICAL EXAM General: Elderly and chronically ill-appearing thin Caucasian male.  Sitting upright in recliner with wife at bedside. HEENT:  Normocephalic and atraumatic. Hard of hearing. Neck:  No JVD.  Lungs: Normal respiratory effort on room air.  Decreased breath sounds with trace right basilar crackles. Heart: HRRR . Normal S1 and S2, 3/6 blowing holosystolic murmur best heard at the apex. Abdomen: Nontender to palpation, distended. Msk: Generalized weakness. Extremities: Chronic hyperpigmentation and woody edema, multiple excoriations and wounds with trace edema bilaterally, significant overall improvement. Neuro: Awake and alert Psych:  Answers questions appropriately.   Labs: Basic Metabolic Panel: Recent Labs    12/04/22 0603 12/05/22 0529 12/06/22 0527  NA 135 136 136  K 3.8 3.6 3.8  CL 98 96* 93*  CO2 30 32 31  GLUCOSE 79 95 79  BUN 45* 36* 32*  CREATININE 1.45* 1.26* 1.15  CALCIUM 8.4* 8.2* 8.5*  MG 2.3  --   --     Liver Function Tests: No results for input(s): "AST", "ALT", "ALKPHOS", "BILITOT", "PROT", "ALBUMIN" in the last 72 hours. No results for input(s): "LIPASE", "AMYLASE" in the last 72 hours. CBC: Recent Labs    12/04/22 0603  WBC 6.0  HGB 11.6*  HCT 34.8*  MCV 101.2*  PLT 79*    Cardiac Enzymes: No results for input(s): "CKTOTAL", "CKMB", "CKMBINDEX", "TROPONINIHS" in the last 72 hours.  BNP: No results for input(s): "BNP" in the last 72 hours.  D-Dimer: No results for input(s): "DDIMER" in the last 72 hours. Hemoglobin A1C: No  results for input(s): "HGBA1C" in the last 72 hours. Fasting Lipid Panel: No results for input(s): "CHOL", "HDL", "LDLCALC", "TRIG", "CHOLHDL", "LDLDIRECT" in the last 72 hours. Thyroid Function Tests: No results for input(s): "TSH", "T4TOTAL", "T3FREE", "THYROIDAB" in the last 72 hours.  Invalid input(s): "FREET3" Anemia Panel: No results for input(s): "VITAMINB12", "FOLATE", "FERRITIN", "TIBC", "IRON", "RETICCTPCT" in the last 72 hours.   Radiology: [facility] Chest Port 1 View  Result Date: 12/05/2022 CLINICAL DATA:  Cough. Technologist notes state recent choking. EXAM: PORTABLE CHEST 1 VIEW COMPARISON:  Chest radiograph 11/30/2022 FINDINGS: Stable cardiomegaly. Unchanged mediastinal contours. Small pleural effusions are similar or slightly improved. Increasing patchy opacity in the left mid lower lung zone. No pneumothorax. Stable osseous structures. IMPRESSION: 1. Increasing patchy opacity in the left mid and lower lung zone, may be atelectasis, aspiration, or pneumonia. 2. Stable cardiomegaly and small pleural effusions. Electronically Signed   By: Narda Rutherford M.D.   On: 12/05/2022 17:28   US RENAL  Result Date: 11/30/2022 CLINICAL DATA:  Acute kidney injury EXAM: RENAL / URINARY TRACT ULTRASOUND COMPLETE COMPARISON:  Renal ultrasound 09/09/2022.  CT abdomen 12/24/2021 FINDINGS: Right Kidney: Renal measurements: 10.1 x 4.8 x 4.5 cm = volume: 115 mL. No hydronephrosis. Increased echogenicity is seen throughout. There is a 1.7 x 1.4 x 1.8 cm cyst in the upper pole. Left Kidney: Renal measurements: 10.3 x 4.9 x 4.1 cm = volume: 107 mL. No hydronephrosis. Normal echogenicity. Are 2 cysts identified in the upper pole measuring 2.1 x 1.0 x 2.0 cm and 1.1 x 1.0 x 1.2 cm. Bladder: Appears normal for degree of bladder distention. Other: There is trace free fluid around the liver and spleen. IMPRESSION: 1. No hydronephrosis. 2. Increased echogenicity in the right kidney is nonspecific but can be seen in the  setting of medical renal disease.  3. Bilateral renal cysts. 4. Trace free fluid around the liver and spleen. Electronically Signed   By: Darliss Cheney M.D.   On: 11/30/2022 17:16   DG Chest 2 View  Result Date: 11/30/2022 CLINICAL DATA:  Shortness of breath. EXAM: CHEST - 2 VIEW COMPARISON:  October 31, 2022. FINDINGS: Stable cardiomegaly. Minimal bibasilar subsegmental atelectasis or edema is noted with minimal pleural effusions. Bony thorax is unremarkable. IMPRESSION: Minimal bibasilar subsegmental atelectasis or edema is noted with minimal bilateral pleural effusions. Aortic Atherosclerosis (ICD10-I70.0). Electronically Signed   By: Lupita Raider M.D.   On: 11/30/2022 11:46    ECHO 08/2022 1. Left ventricular ejection fraction, by estimation, is 55 to 60%. The  left ventricle has normal function. The left ventricle has no regional  wall motion abnormalities. There is moderate left ventricular hypertrophy.  Left ventricular diastolic  parameters are indeterminate. There is the interventricular septum is  flattened in systole and diastole, consistent with right ventricular  pressure and volume overload.   2. Right ventricular systolic function is mildly reduced. The right  ventricular size is moderately enlarged. There is severely elevated  pulmonary artery systolic pressure. The estimated right ventricular  systolic pressure is 70.4 mmHg.   3. Left atrial size was moderately dilated.   4. Right atrial size was mildly dilated.   5. The mitral valve is normal in structure. Moderate to severe mitral  valve regurgitation. No evidence of mitral stenosis.   6. Tricuspid valve regurgitation is moderate to severe.   7. The aortic valve has an indeterminant number of cusps. Aortic valve  regurgitation is not visualized. Aortic valve sclerosis/calcification is  present, without any evidence of aortic stenosis.   8. There is borderline dilatation of the ascending aorta, measuring 36  mm.   9. The  inferior vena cava is dilated in size with <50% respiratory  variability, suggesting right atrial pressure of 15 mmHg.   TELEMETRY reviewed by me (LT) 12/06/2022 : NSR first-degree AV block occasional PVCs  EKG reviewed by me: Sinus rhythm first-degree AV block nonspecific T wave abnormality  Data reviewed by me (LT) 12/06/2022: Hospitalist progress note, PT note, palliative care note nursing notes last 24h vitals tele labs imaging I/O   Principal Problem:   Acute on chronic heart failure with preserved ejection fraction (HFpEF) (HCC) Active Problems:   Longstanding persistent atrial fibrillation (HCC)   Rheumatic mitral regurgitation   Macrocytic anemia   Pulmonary embolism (HCC)   Thrombocytopenia (HCC)   Hematuria   AKI (acute kidney injury) (HCC)   Hypotension   Hypokalemia    ASSESSMENT AND PLAN:  Marcus Kemp "Marcus Kemp" is a 89yoM with a PMH of HFpEF (>55%), 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  Cardiology is consulted for further assistance.  # Acute on chronic HFpEF # Severe rheumatic MR Presents with significant peripheral edema extending from his feet up into his groin and abdomen despite adjustment of his torsemide on an outpatient basis, limiting his ability to ambulate due to the weight of his legs. Dietary indiscretions with potato chips likely contributory, but family does their best to ensure that the patient appears to his salt and fluid restricted diet.  On admission BNP elevated to 1800 and chest x-ray with pleural effusions.  He is clinically hypervolemic on exam  today, although with clinical improvement after initial diuresis. Weight down 10 kg since 5/8.  -S/p IV Lasix 40 mg x 1 in ED -Stop Lasix drip at 4 mg/h this afternoon after the  current bag runs out -Restart torsemide 40 mg in the morning and 20 mg at night tomorrow -Continue to hold metoprolol with borderline blood pressures - Monitor and replenish electrolytes for a goal K >4, mag >2 -Additional GDMT limited by hypotension and renal insufficiency -Strict salt and fluid restriction, strict I/O's -Defer additional cardiac diagnostics at this time. -Appreciate palliative care assistance, ongoing GOC discussions.  Remains DNR with dispo planning ongoing.  # Paroxysmal atrial fibrillation In sinus rhythm on telemetry today with controlled rate, did convert to AF RVR overnight on 5/11. Continue Eliquis 5 mg twice daily for stroke prevention, and with history of recent pulmonary embolus in February 2024.  Note for signs/symptoms of bleeding, as he has a history of complications with full dose Eliquis  # AKI Renal function fluctuating with diuresis from creatinine 1.74 on admission to 1.26 today, current GFR 53. Monitor closely.  # Demand ischemia Borderline elevated at 59, which in the absence of chest pain or EKG changes in the setting of acute on chronic HFpEF as above, this is most consistent with demand/supply mismatch and not ACS   Anticipate discharge readiness from a cardiac perspective tomorrow morning, 5/14.  This patient's plan of care was discussed and created with Dr. Darrold Junker and he is in agreement.  Signed: Rebeca Allegra , PA-C 12/06/2022, 3:19 PM [facility] Cardiology

## 2022-12-06 NOTE — Evaluation (Signed)
Clinical/Bedside Swallow Evaluation Patient Details  Name: Marcus Kemp MRN: [id number] Date of Birth: 1925/06/06  Today's Date: 12/06/2022 Time: SLP Start Time (ACUTE ONLY): 0840 SLP Stop Time (ACUTE ONLY): 0905 SLP Time Calculation (min) (ACUTE ONLY): 25 min  Past Medical History:  Past Medical History:  Diagnosis Date   A-fib (HCC)    a.) CHA2DS2-VASc = 4 (age x 2, CHF, aortic plaque). b.) s/p unsuccessful TEE cardioversion 04/28/2016 --> rec'd 200 J x1 and 360 J x 2. c.) rate/rhythm maintained on oral metoprolol succinante; chronically anticoagulated using dose reduced apixaban.   Anemia    Aortic atherosclerosis (HCC)    Arthritis    CHF (congestive heart failure) (HCC)    a.) TTE 04/26/2016: EF >55%; mod LVH; MVP; mild-mod LAE, mild MR. b.) TTE 05/14/209: EF >55%; mod RVE; mild BAE; mod MR.TR; triv AR/PR. c.) TTE 03/28/2019: EF >55%; mild LVH; mod BAE; mild PR, mod MR/TR.   GERD (gastroesophageal reflux disease)    Heart murmur    History of kidney stones    HOH (hard of hearing)    a.) hearing aid to LEFT ear.   Hyperlipidemia    Hypertension    Infrarenal abdominal aortic aneurysm (AAA) without rupture (HCC) 05/05/2021   a.) CT 05/05/2021 --> measured 3.0 cm.   MVP (mitral valve prolapse)    Peripheral edema    Pneumonia    Pulmonary embolus (HCC)    Pulmonary HTN (HCC) 04/26/2016   a.) TTE 04/26/2016: EF >55%; PASP 45. b.) TTE 12/06/2017: EF >55%; RVSP 76.6 mmHg. c.) TTE 03/28/2019: EF >55%; RVSP 77.8 mmHg.   Renal cell carcinoma (HCC)    Rheumatic fever    Sepsis (HCC)    Valvular heart disease    Past Surgical History:  Past Surgical History:  Procedure Laterality Date   INGUINAL HERNIA REPAIR Left 01/13/2022   Procedure: HERNIA REPAIR INGUINAL ADULT;  Surgeon: Earline Mayotte, MD;  Location: [facility] ORS;  Service: General;  Laterality: Left;   INSERTION OF MESH  01/13/2022   Procedure: INSERTION OF MESH;  Surgeon: Earline Mayotte, MD;  Location: [facility] ORS;   Service: General;;   LITHOTRIPSY     TEE WITH CARDIOVERSION N/A    P   TONSILLECTOMY     HPI:  Patient is a 89 year old male with history of heart failure with preserved ejection fraction, recent PE on Eliquis, pulmonary hypertension, persistent A-fib, renal cell carcinoma, rheumatic heart disease who presented to the emergency department from home with complaint of bilateral lower extremity swelling, generalized weakness, dyspnea on exertion. On presentation, he was found to be hypotensive. Lab work showed creatinine of 1.7, BNP of 1813. Chest x-ray showed features of pulmonary edema, minimal bilateral pleural effusion. Cardiology consulted. Currently on IV Lasix. On 5/12: Wife concern about a choking spell yesterday with increased coughing today.  Improving renal function so cardiology would like to continue Lasix infusion for another day.  CXR 12/05/22: Increasing patchy opacity in the left mid and lower lung zone may be atelectasis, aspiration, or pneumonia. Stable cardiomegaly and small pleural effusions. Pt currently on regular solids and thin liquids diet. Upon therapist approach, pt resting in bed and on room air. Pt benefiting from increased volume. given HOH status    Assessment / Plan / Recommendation  Clinical Impression  Pt seen for bedside swallow assessment in the setting of concern for choking and aspiration. CXR on 5/12 revealing concern for atelectasis, aspiration, or PNA. Pt with congested, weak cough at baseline,  witnessed prior to PO trial and persisting t/o trials. Despite effort, pt was not productive, with report of feeling frustrated of inability to "cough up phlegm." O2 saturations maintained at greater than 94 for duration of session, despite coughing. Immediate wet vocal quality and cough noted x1 with large sip of thin liquids for medication administration. Immediate cough not duplicated with additional trials of thin liquid. Oral phase was grossly intact- with pt  demonstrating efficient mastication, transfer, and clearance.   Based on persistent nature of congested cough, cough cannot be utilized to signify aspiration and therapist cannot rule out the presence of aspiration at the bedside. Further, based on current pulmonary status, constant sensation for need to cough, and general debility/medical picture, pt is at increased risk for aspiration. Recommend continuation of regular solids and thin liquids with strict aspiration precautions (slow rate, small bites, elevated HOB, cessation of intake for recovery from coughing, and alert for PO intake). Medications administered in puree when able. Plan to continue to monitor for indication for need for instrumental assessment. Medical team made aware and in agreement. SLP Visit Diagnosis: Dysphagia, pharyngeal phase (R13.13);Dysphagia, unspecified (R13.10)    Aspiration Risk  Moderate aspiration risk    Diet Recommendation   Regular Solids and Thin Liquids   Medication Administration: Whole meds with puree    Other  Recommendations Oral Care Recommendations: Oral care BID    Recommendations for follow up therapy are one component of a multi-disciplinary discharge planning process, led by the attending physician.  Recommendations may be updated based on patient status, additional functional criteria and insurance authorization.  Follow up Recommendations Home health SLP      Assistance Recommended at Discharge  Supervision/Assistance   Functional Status Assessment Patient has had a recent decline in their functional status and demonstrates the ability to make significant improvements in function in a reasonable and predictable amount of time.  Frequency and Duration min 2x/week  1 week       Prognosis Prognosis for improved oropharyngeal function: Fair Barriers to Reach Goals: Other (Comment) (medical status, deconditioning)      Swallow Study   General Date of Onset: 12/06/22 HPI: Patient is a  89 year old male with history of heart failure with preserved ejection fraction, recent PE on Eliquis, pulmonary hypertension, persistent A-fib, renal cell carcinoma, rheumatic heart disease who presented to the emergency department from home with complaint of bilateral lower extremity swelling, generalized weakness, dyspnea on exertion. On presentation, he was found to be hypotensive. Lab work showed creatinine of 1.7, BNP of 1813. Chest x-ray showed features of pulmonary edema, minimal bilateral pleural effusion. Cardiology consulted. Currently on IV Lasix. On 5/12: Wife concern about a choking spell yesterday with increased coughing today.  Improving renal function so cardiology would like to continue Lasix infusion for another day.  CXR 12/05/22: Increasing patchy opacity in the left mid and lower lung zone may be atelectasis, aspiration, or pneumonia. Stable cardiomegaly and small pleural effusions. Pt currently on regular solids and thin liquids diet. Upon therapist approach, pt resting in bed and on room air. Pt benefiting from increased volume. given HOH status Type of Study: Bedside Swallow Evaluation Previous Swallow Assessment: none in  chart Diet Prior to this Study: Regular;Thin liquids (Level 0) Temperature Spikes Noted: No Respiratory Status: Room air History of Recent Intubation: No Behavior/Cognition: Alert;Cooperative [facility]) Oral Cavity Assessment: Within Functional Limits Oral Care Completed by SLP: Recent completion by staff Oral Cavity - Dentition: Dentures, top;Dentures, bottom Vision: Functional for self-feeding Self-Feeding  Abilities: Able to feed self Patient Positioning: Upright in bed Baseline Vocal Quality: Wet (Congested) Volitional Cough: Weak;Congested Volitional Swallow: Able to elicit    Oral/Motor/Sensory Function Overall Oral Motor/Sensory Function: Within functional limits   Ice Chips Ice chips: Not tested   Thin Liquid Thin Liquid: Impaired Presentation:  Straw Oral Phase Impairments:  (WFL) Oral Phase Functional Implications:  (WFL) Pharyngeal  Phase Impairments: Wet Vocal Quality;Cough - Delayed;Cough - Immediate    Nectar Thick Nectar Thick Liquid: Not tested   Honey Thick Honey Thick Liquid: Not tested   Puree Puree: Impaired Presentation: Spoon;Self Fed Oral Phase Impairments:  (WFL) Oral Phase Functional Implications:  (WFL) Pharyngeal Phase Impairments: Cough - Delayed   Solid     Solid: Impaired Presentation: Self Fed Oral Phase Impairments:  (WFL) Oral Phase Functional Implications:  (WFL) Pharyngeal Phase Impairments: Cough - Delayed;Cough - Immediate     [place] J Clapp MS CCC-SLP  [place] J Clapp 12/06/2022,10:02 AM

## 2022-12-06 NOTE — Progress Notes (Signed)
PROGRESS NOTE  Marcus Kemp  ZOX:[id number] DOB: [birth year] DOA: 11/30/2022 PCP: Marcus Arbour, MD   Brief Narrative: Patient is a 89 year old male with history of heart failure with preserved ejection fraction, recent PE on Eliquis, pulmonary hypertension, persistent A-fib, renal cell carcinoma, rheumatic heart disease who presented to the emergency department from home with complaint of bilateral lower extremity swelling, generalized weakness, dyspnea on exertion.  On presentation, she was found to be hypotensive.  Lab work showed creatinine of 1.7, BNP of 1813.  Chest x-ray showed features of pulmonary edema, minimal bilateral pleural effusion.  Cardiology consulted.  Currently on IV Lasix.  5/11:Hemodynamically stable, no urinary output recorded.  Cardiology would like to continue Lasix infusion for another day and then might switch to p.o.  Renal functions continue to improve.  5/12: Hemodynamically stable.  Wife concern about a choking spell yesterday with increased coughing today.  Improving renal function so cardiology would like to continue Lasix infusion for another day.  Good UOP and improvement in weight.  Chest x-ray and swallow evaluation ordered.  5/13: Continue to have cough.  Chest x-ray with some new infiltrate concerning for aspiration, started on Levaquin and Flagyl as penicillin allergies was listed as anaphylaxis.  Also ordered chest PT. swallow evaluation with increased risk of aspiration and they placed aspiration precautions and outpatient home health SLP. Palliative care to follow as outpatient. Cardiology will continue with current bag of IV Lasix and transition to p.o. torsemide from tomorrow. Can be discharged home tomorrow morning if remains stable.  Assessment & Plan:  Principal Problem:   Acute on chronic heart failure with preserved ejection fraction (HFpEF) (HCC) Active Problems:   AKI (acute kidney injury) (HCC)   Hypotension   Rheumatic mitral  regurgitation   Longstanding persistent atrial fibrillation (HCC)   Macrocytic anemia   Pulmonary embolism (HCC)   Thrombocytopenia (HCC)   Hematuria   Hypokalemia  Acute on chronic diastolic congestive heart failure: Presented with gradual worsening of lower extremity edema, weight gain.  Takes torsemide 40 mg twice a day at home.  Recent echo in February 2024 showed EF of 55 to 60%, moderate LVH, mildly reduced RV systolic function, severely elevated pulmonary systolic pressure.  Currently on Lasix drip.  Cardiology following-will stop Lasix infusion after current bag which will be finishing in the evening.  Followed by p.o. torsemide starting from tomorrow.  Renal functions continue to improve  AKI on CKD stage IIIa: Likely from cardiorenal syndrome from acute on chronic CHF.  Kidney function improving with Lasix monitor input/output, daily weight.  Creatinine on 10/27/2022 was 1.26  Concern of aspiration.  Chest x-ray with new infiltrate concerning for aspiration pneumonia.  Procalcitonin negative.  Worsening cough so he was started on Levaquin and Flagyl. Chest PT was ordered Swallow evaluation with increased risk of aspiration so aspiration precautions and out patient home health SLP was recommended.  Acute hypoxic respiratory failure: This is most likely secondary to volume overload/pulmonary edema on admission.  Lungs were almost clear on auscultation today.  Does not use oxygen at home.   Able to wean back to room air  Chronic hypotension: Currently on midodrine  Rheumatic heart disease: History of rheumatic fever .  Has some moderate to severe MR as per echo.  She is not a surgical candidate  Persistent A-fib: Currently rate is controlled.  On Eliquis and metoprolol.  Monitor on telemetry.  Currently in normal sinus rhythm  Microscopic hematuria: Noted on urinalysis.  We recommend follow-up  with urology as an outpatient if needed  Thrombocytopenia: Mild.  Stable, chronic.  Continue  to monitor  History of PE: On Eliquis  Microcytic anemia: Hemoglobin stable  Hypokalemia: Continue to monitor and supplement as needed  Goals of care: Elderly patient with diastolic heart failure,on iv drip lasix.DNR. Goals of care discussion will be helpful. Ambulates with walker ,lives with family Plan is to go go home with palliative care following as outpatient.      DVT prophylaxis: apixaban (ELIQUIS) tablet 5 mg     Code Status: DNR  Family Communication: Discussed with wife at bedside  Patient status:Inpatient  Patient is from :Home  Anticipated discharge GM:[facility]  Estimated DC date:2-3 days,needs cardiology clearance   Consultants: Cardiology  Procedures:None  Antimicrobials: Levaquin and Flagyl Anti-infectives (From admission, onward)    Start     Dose/Rate Route Frequency Ordered Stop   12/06/22 1100  levofloxacin (LEVAQUIN) tablet 750 mg        750 mg Oral Every 48 hours 12/06/22 1012 12/12/22 1059   12/06/22 1100  metroNIDAZOLE (FLAGYL) tablet 500 mg        500 mg Oral Every 12 hours 12/06/22 1012 12/11/22 0959       Subjective: Patient continued to have cough and having some nasal congestion, stating that he cannot bring his secretions up.  Objective: Vitals:   12/05/22 2334 12/06/22 0350 12/06/22 0829 12/06/22 1105  BP: 96/61 102/60 102/60 (!) 90/59  Pulse: 82 80 83 92  Resp: 14 15 18 20   Temp: (!) 97.5 F (36.4 C) 97.8 F (36.6 C) 98.2 F (36.8 C) 97.6 F (36.4 C)  TempSrc:    Oral  SpO2: 94% 92% 96% 97%  Weight:   54.9 kg     Intake/Output Summary (Last 24 hours) at 12/06/2022 1350 Last data filed at 12/06/2022 1107 Gross per 24 hour  Intake 540 ml  Output 2550 ml  Net -2010 ml    Filed Weights   12/04/22 0713 12/05/22 0729 12/06/22 0829  Weight: 57.7 kg 54 kg 54.9 kg    Examination:  General.  Frail elderly man, in no acute distress. Pulmonary.  Lungs clear bilaterally, normal respiratory effort. CV.  Regular rate and  rhythm, no JVD, rub or murmur. Abdomen.  Soft, nontender, nondistended, BS positive. CNS.  Alert and oriented .  No focal neurologic deficit. Extremities.  1+ LE edema, no cyanosis, pulses intact and symmetrical. Psychiatry.  Appears to have some cognitive impairment  Data Reviewed: I have personally reviewed following labs and imaging studies  CBC: Recent Labs  Lab 11/30/22 1051 12/01/22 0522 12/04/22 0603  WBC 5.0 4.9 6.0  NEUTROABS  --  2.9  --   HGB 12.0* 11.8* 11.6*  HCT 37.0* 35.8* 34.8*  MCV 104.8* 103.5* 101.2*  PLT 105* 86* 79*    Basic Metabolic Panel: Recent Labs  Lab 12/01/22 0522 12/02/22 0311 12/03/22 0514 12/04/22 0603 12/05/22 0529 12/06/22 0527  NA 140 135 135 135 136 136  K 3.3* 4.3 3.7 3.8 3.6 3.8  CL 102 95* 97* 98 96* 93*  CO2 30 34* 28 30 32 31  GLUCOSE 74 85 80 79 95 79  BUN 42* 47* 44* 45* 36* 32*  CREATININE 1.41* 1.70* 1.56* 1.45* 1.26* 1.15  CALCIUM 7.7* 8.7* 8.3* 8.4* 8.2* 8.5*  MG 2.3  --   --  2.3  --   --       No results found for this or any previous visit (from the  past 240 hour(s)).   Radiology Studies: DG Chest Port 1 View  Result Date: 12/05/2022 CLINICAL DATA:  Cough. Technologist notes state recent choking. EXAM: PORTABLE CHEST 1 VIEW COMPARISON:  Chest radiograph 11/30/2022 FINDINGS: Stable cardiomegaly. Unchanged mediastinal contours. Small pleural effusions are similar or slightly improved. Increasing patchy opacity in the left mid lower lung zone. No pneumothorax. Stable osseous structures. IMPRESSION: 1. Increasing patchy opacity in the left mid and lower lung zone, may be atelectasis, aspiration, or pneumonia. 2. Stable cardiomegaly and small pleural effusions. Electronically Signed   By: Narda Rutherford M.D.   On: 12/05/2022 17:28    Scheduled Meds:  apixaban  5 mg Oral BID   levofloxacin  750 mg Oral Q48H   metroNIDAZOLE  500 mg Oral Q12H   midodrine  5 mg Oral TID WC   potassium chloride  40 mEq Oral Once    rOPINIRole  1 mg Oral QHS   [START ON 12/07/2022] torsemide  20 mg Oral QPM   [START ON 12/07/2022] torsemide  40 mg Oral q AM   traZODone  50 mg Oral QHS   Continuous Infusions:     LOS: 6 days   Arnetha Courser, MD [facility] P5/13/2024, 1:50 PM

## 2022-12-07 LAB — BASIC METABOLIC PANEL
Anion gap: 7 (ref 5–15)
BUN: 31 mg/dL — ABNORMAL HIGH (ref 8–23)
CO2: 32 mmol/L (ref 22–32)
Calcium: 8.6 mg/dL — ABNORMAL LOW (ref 8.9–10.3)
Chloride: 96 mmol/L — ABNORMAL LOW (ref 98–111)
Creatinine, Ser: 1.2 mg/dL (ref 0.61–1.24)
GFR, Estimated: 56 mL/min — ABNORMAL LOW (ref >60)
Glucose, Bld: 96 mg/dL (ref 70–99)
Potassium: 3.6 mmol/L (ref 3.5–5.1)
Sodium: 135 mmol/L (ref 135–145)

## 2022-12-07 LAB — CBC
HCT: 37 % — ABNORMAL LOW (ref 39.0–52.0)
Hemoglobin: 12.5 g/dL — ABNORMAL LOW (ref 13.0–17.0)
MCH: 34.2 pg — ABNORMAL HIGH (ref 26.0–34.0)
MCHC: 33.8 g/dL (ref 30.0–36.0)
MCV: 101.4 fL — ABNORMAL HIGH (ref 80.0–100.0)
Platelets: 81 10*3/uL — ABNORMAL LOW (ref 150–400)
RBC: 3.65 MIL/uL — ABNORMAL LOW (ref 4.22–5.81)
RDW: 18.8 % — ABNORMAL HIGH (ref 11.5–15.5)
WBC: 6.6 10*3/uL (ref 4.0–10.5)
nRBC: 0 % (ref 0.0–0.2)

## 2022-12-07 NOTE — Plan of Care (Signed)

## 2022-12-07 NOTE — Discharge Summary (Signed)
Physician Discharge Summary  ADRIEN GREENAWAY WUJ:[id number] DOB: Dec 19, 1924 DOA: 11/30/2022  PCP: Marguarite Arbour, MD  Admit date: 11/30/2022 Discharge date: 12/07/2022  Admitted From: Home Disposition: Home  Recommendations for Outpatient Follow-up:  Follow up with PCP in 1-2 weeks Follow-up with cardiology as scheduled  Home Health: PT Equipment/Devices: None  Discharge Condition: Stable CODE STATUS: DNR Diet recommendation: Low-salt low-fat fluid restricted diet per cardiology  Brief/Interim Summary: Patient is a 89 year old male with history of heart failure with preserved ejection fraction, recent PE on Eliquis, pulmonary hypertension, persistent A-fib, renal cell carcinoma, rheumatic heart disease who presented to the emergency department from home with complaint of bilateral lower extremity swelling, generalized weakness, dyspnea on exertion. On presentation, she was found to be hypotensive. Lab work showed creatinine of 1.7, BNP of 1813. Chest x-ray showed features of pulmonary edema, minimal bilateral pleural effusion.  Patient admitted as above with acute heart failure exacerbation, cardiology consulted.  Patient had good results with IV Lasix, now appearing euvolemic transition back to p.o. diuretics, medication changes as below but otherwise back to baseline.  There was a reported cough but unremarkable imaging, without fever or leukocytosis and negative procalcitonin antibiotics were discontinued.  Question of increased risk for aspiration although no overt issues or episodes while here.  Recommend outpatient evaluation if dysphagia becomes a concern.  Patient otherwise stable and wife agrees he looks back to baseline and agreeable for discharge home.    Discharge Diagnoses:  Principal Problem:   Acute on chronic heart failure with preserved ejection fraction (HFpEF) (HCC) Active Problems:   AKI (acute kidney injury) (HCC)   Hypotension   Rheumatic mitral  regurgitation   Longstanding persistent atrial fibrillation (HCC)   Macrocytic anemia   Pulmonary embolism (HCC)   Thrombocytopenia (HCC)   Hematuria   Hypokalemia  Acute on chronic diastolic congestive heart failure:  February 2024 showed EF of 55 to 60%, moderate LVH, mildly reduced RV systolic function, severely elevated pulmonary systolic pressure.   Outpatient follow-up with cardiology   AKI on CKD stage IIIa, improving Cardiorenal given acute on chronic CHF.     Concern of aspiration.   -Chest x-ray personally evaluated, no overt changes consistent with pneumonia noted, patient is afebrile negative procalcitonin without leukocytosis.   -Cough appears to have resolved overnight, no indication for ongoing antibiotics -Continue aspiration precautions, outpatient speech follow-up  Acute hypoxic respiratory failure:  -Secondary to above, resolved   Chronic hypotension: Currently on midodrine   Rheumatic heart disease: History of rheumatic fever .  Has some moderate to severe MR as per echo.  She is not a surgical candidate   Persistent A-fib: Currently rate is controlled.  On Eliquis and metoprolol.     Microscopic hematuria: Noted on urinalysis.  Follow-up with urology as an outpatient if needed   Thrombocytopenia: Mild.  Stable, chronic.  Continue to monitor   History of PE: On Eliquis   Microcytic anemia: Hemoglobin stable   Hypokalemia: Continue to monitor and supplement as needed     Discharge Instructions  Discharge Instructions     Discharge patient   Complete by: As directed    Discharge disposition: 06-Home-Health Care Svc   Discharge patient date: 12/07/2022      Allergies as of 12/07/2022       Reactions   Penicillins Anaphylaxis   Sulfa Antibiotics Nausea Only        Medication List     STOP taking these medications    metoprolol succinate 25  MG 24 hr tablet Commonly known as: TOPROL-XL       TAKE these medications    apixaban 5 MG  Tabs tablet Commonly known as: ELIQUIS Take 5 mg by mouth 2 (two) times daily.   Azelastine HCl 137 MCG/SPRAY Soln Place 1 spray into the nose 2 (two) times daily.   cetirizine 10 MG tablet Commonly known as: ZYRTEC SMARTSIG:1 pill By Mouth Daily PRN   cyanocobalamin 500 MCG tablet Commonly known as: VITAMIN B12 Take 1 tablet (500 mcg total) by mouth daily.   empagliflozin 10 MG Tabs tablet Commonly known as: Jardiance Take 1 tablet (10 mg total) by mouth daily.   fluorouracil 5 % cream Commonly known as: EFUDEX Apply 1 Application topically 2 (two) times daily.   midodrine 5 MG tablet Commonly known as: PROAMATINE Take 5 mg by mouth as needed. For SBP >100   rOPINIRole 1 MG tablet Commonly known as: REQUIP Take 1 mg by mouth at bedtime.   torsemide 20 MG tablet Commonly known as: DEMADEX 40mg  in the morning and 20mg  in the evening   traZODone 50 MG tablet Commonly known as: DESYREL Take 50 mg by mouth at bedtime.        Follow-up Information     Leanora Ivanoff, [place]. Go in 1 week(s).   Contact information: [address] [facility] [place] [address] [phone]                Allergies  Allergen Reactions   Penicillins Anaphylaxis   Sulfa Antibiotics Nausea Only    Consultations: Cardiology  Procedures/Studies: DG Chest Port 1 View  Result Date: 12/05/2022 CLINICAL DATA:  Cough. Technologist notes state recent choking. EXAM: PORTABLE CHEST 1 VIEW COMPARISON:  Chest radiograph 11/30/2022 FINDINGS: Stable cardiomegaly. Unchanged mediastinal contours. Small pleural effusions are similar or slightly improved. Increasing patchy opacity in the left mid lower lung zone. No pneumothorax. Stable osseous structures. IMPRESSION: 1. Increasing patchy opacity in the left mid and lower lung zone, may be atelectasis, aspiration, or pneumonia. 2. Stable cardiomegaly and small pleural effusions. Electronically Signed   By: Narda Rutherford M.D.   On:  12/05/2022 17:28   US RENAL  Result Date: 11/30/2022 CLINICAL DATA:  Acute kidney injury EXAM: RENAL / URINARY TRACT ULTRASOUND COMPLETE COMPARISON:  Renal ultrasound 09/09/2022.  CT abdomen 12/24/2021 FINDINGS: Right Kidney: Renal measurements: 10.1 x 4.8 x 4.5 cm = volume: 115 mL. No hydronephrosis. Increased echogenicity is seen throughout. There is a 1.7 x 1.4 x 1.8 cm cyst in the upper pole. Left Kidney: Renal measurements: 10.3 x 4.9 x 4.1 cm = volume: 107 mL. No hydronephrosis. Normal echogenicity. Are 2 cysts identified in the upper pole measuring 2.1 x 1.0 x 2.0 cm and 1.1 x 1.0 x 1.2 cm. Bladder: Appears normal for degree of bladder distention. Other: There is trace free fluid around the liver and spleen. IMPRESSION: 1. No hydronephrosis. 2. Increased echogenicity in the right kidney is nonspecific but can be seen in the setting of medical renal disease. 3. Bilateral renal cysts. 4. Trace free fluid around the liver and spleen. Electronically Signed   By: Darliss Cheney M.D.   On: 11/30/2022 17:16   DG Chest 2 View  Result Date: 11/30/2022 CLINICAL DATA:  Shortness of breath. EXAM: CHEST - 2 VIEW COMPARISON:  October 31, 2022. FINDINGS: Stable cardiomegaly. Minimal bibasilar subsegmental atelectasis or edema is noted with minimal pleural effusions. Bony thorax is unremarkable. IMPRESSION: Minimal bibasilar subsegmental atelectasis or edema is  noted with minimal bilateral pleural effusions. Aortic Atherosclerosis (ICD10-I70.0). Electronically Signed   By: Lupita Raider M.D.   On: 11/30/2022 11:46     Subjective: No acute issues or events overnight   Discharge Exam: Vitals:   12/07/22 0800 12/07/22 1127  BP: 111/66   Pulse:    Resp: 18   Temp: 97.8 F (36.6 C)   SpO2:  90%   Vitals:   12/06/22 2330 12/07/22 0407 12/07/22 0800 12/07/22 1127  BP: 96/60 109/77 111/66   Pulse: 79 87    Resp: 18 18 18    Temp: 97.8 F (36.6 C) 97.8 F (36.6 C) 97.8 F (36.6 C)   TempSrc:   Oral    SpO2: 95% 94%  90%  Weight:        General: Pt is alert, awake, not in acute distress Cardiovascular: RRR, S1/S2 +, no rubs, no gallops Respiratory: CTA bilaterally, no wheezing, no rhonchi Abdominal: Soft, NT, ND, bowel sounds + Extremities: no edema, no cyanosis    The results of significant diagnostics from this hospitalization (including imaging, microbiology, ancillary and laboratory) are listed below for reference.      Labs: BNP (last 3 results) Recent Labs    10/31/22 1720 11/22/22 1115 11/30/22 1051  BNP 1,945.2* 1,545.3* 1,813.4*   Basic Metabolic Panel: Recent Labs  Lab 12/01/22 0522 12/02/22 0311 12/03/22 0514 12/04/22 0603 12/05/22 0529 12/06/22 0527 12/07/22 0446  NA 140   < > 135 135 136 136 135  K 3.3*   < > 3.7 3.8 3.6 3.8 3.6  CL 102   < > 97* 98 96* 93* 96*  CO2 30   < > 28 30 32 31 32  GLUCOSE 74   < > 80 79 95 79 96  BUN 42*   < > 44* 45* 36* 32* 31*  CREATININE 1.41*   < > 1.56* 1.45* 1.26* 1.15 1.20  CALCIUM 7.7*   < > 8.3* 8.4* 8.2* 8.5* 8.6*  MG 2.3  --   --  2.3  --   --   --    < > = values in this interval not displayed.    CBC: Recent Labs  Lab 12/01/22 0522 12/04/22 0603 12/07/22 0446  WBC 4.9 6.0 6.6  NEUTROABS 2.9  --   --   HGB 11.8* 11.6* 12.5*  HCT 35.8* 34.8* 37.0*  MCV 103.5* 101.2* 101.4*  PLT 86* 79* 81*    Urinalysis    Component Value Date/Time   COLORURINE YELLOW (A) 11/30/2022 1154   APPEARANCEUR CLEAR (A) 11/30/2022 1154   LABSPEC 1.010 11/30/2022 1154   PHURINE 6.0 11/30/2022 1154   GLUCOSEU 150 (A) 11/30/2022 1154   HGBUR LARGE (A) 11/30/2022 1154   BILIRUBINUR NEGATIVE 11/30/2022 1154   KETONESUR NEGATIVE 11/30/2022 1154   PROTEINUR 30 (A) 11/30/2022 1154   NITRITE NEGATIVE 11/30/2022 1154   LEUKOCYTESUR NEGATIVE 11/30/2022 1154   Sepsis Labs Recent Labs  Lab 12/01/22 0522 12/04/22 0603 12/07/22 0446  WBC 4.9 6.0 6.6   Microbiology No results found for this or any previous visit (from  the past 240 hour(s)).   Time coordinating discharge: Over 30 minutes  SIGNED:   Azucena Fallen, DO [facility] 12/07/2022, 11:47 AM Pager   If 7PM-7AM, please contact night-coverage [email or web address]

## 2022-12-07 NOTE — TOC Transition Note (Signed)
Transition of Care [facility]) - CM/SW Discharge Note   Patient Details  Name: Marcus Kemp MRN: [id number] Date of Birth: 1925-05-02  Transition of Care [facility] CM/SW Contact:  Truddie Hidden, RN Phone Number: 12/07/2022, 2:22 PM   Clinical Narrative:    Patient discharged. Attempt to reach patient's spouse. No answer. Left a message.  Patient's HH previously arranged via  [facility].  Becky Sax of [facility] notified. F TOC signing off      Barriers to Discharge: Continued Medical Work up   Patient Goals and CMS Choice      Discharge Placement                         Discharge Plan and Services Additional resources added to the After Visit Summary for       Post Acute Care Choice:  (TBD)                               Social Determinants of Health (SDOH) Interventions SDOH Screenings   Food Insecurity: No Food Insecurity (12/01/2022)  Housing: Low Risk  (12/01/2022)  Transportation Needs: No Transportation Needs (12/01/2022)  Utilities: Not At Risk (12/01/2022)  Tobacco Use: Medium Risk (12/01/2022)     Readmission Risk Interventions     No data to display

## 2022-12-07 NOTE — Care Management Important Message (Signed)
Important Message  Patient Details  Name: Marcus Kemp MRN: [id number] Date of Birth: May 04, 1925   Medicare Important Message Given:  Yes     [name] 12/07/2022, 11:37 AM

## 2022-12-07 NOTE — Progress Notes (Signed)
AVS instructions were reviewed with patient and wife at bedside. All questions answered, all personal belongings returned.

## 2022-12-07 NOTE — Progress Notes (Signed)
Occupational Therapy Treatment Patient Details Name: Marcus Kemp MRN: [id number] DOB: 05-22-1925 Today's Date: 12/07/2022   History of present illness Pt admitted for acute/chronic heart failure with preserved EF and complaints of B LE swelling/weakness. HIstory includes heart failure, pulm hyptension, HTN, Afib, renal cell carcinoma, and reheumatic fever.   OT comments  Pt seen for OT tx. Pt in recliner, agreeable to session. Spouse present and supportive. Pt completed ADL transfers with CGA + RW and ambulated ~60' in room into hallway with fair balance and denies SOB throughout. On room air, SpO2 89-90% on RA at rest, 88-89% with exertional activity, HR in 80's-90's (telemonitor indicating Afib). RN notified of vitals with mobility. Pt returned to recliner. Denies complaints. Pt continues to benefit from skilled OT services. Continue with POC.    Recommendations for follow up therapy are one component of a multi-disciplinary discharge planning process, led by the attending physician.  Recommendations may be updated based on patient status, additional functional criteria and insurance authorization.    Assistance Recommended at Discharge Intermittent Supervision/Assistance  Patient can return home with the following  A little help with walking and/or transfers;A little help with bathing/dressing/bathroom;Assistance with cooking/housework;Direct supervision/assist for medications management;Direct supervision/assist for financial management;Assist for transportation;Help with stairs or ramp for entrance   Equipment Recommendations  Other (comment) (pulse oximeter)    Recommendations for Other Services      Precautions / Restrictions Precautions Precautions: Fall Precaution Comments: very HOH, L ear better; monitor O2 Restrictions Weight Bearing Restrictions: No       Mobility Bed Mobility               General bed mobility comments: NT, in recliner     Transfers Overall transfer level: Needs assistance Equipment used: Rolling walker (2 wheels) Transfers: Sit to/from Stand Sit to Stand: Min guard                 Balance Overall balance assessment: Needs assistance Sitting-balance support: Feet supported Sitting balance-Leahy Scale: Good     Standing balance support: Bilateral upper extremity supported, Reliant on assistive device for balance Standing balance-Leahy Scale: Fair                             ADL either performed or assessed with clinical judgement   ADL Overall ADL's : Needs assistance/impaired                                     Functional mobility during ADLs: Min guard;Rolling walker (2 wheels)      Extremity/Trunk Assessment              Vision       Perception     Praxis      Cognition Arousal/Alertness: Awake/alert Behavior During Therapy: WFL for tasks assessed/performed, Flat affect Overall Cognitive Status: Within Functional Limits for tasks assessed                                 General Comments: very HOH        Exercises Other Exercises Other Exercises: pt/spouse educated in use of pulse oximeter to support self monitoring during exertional activity    Shoulder Instructions       General Comments      Pertinent Vitals/ Pain  Pain Assessment Pain Assessment: No/denies pain  Home Living                                          Prior Functioning/Environment              Frequency  Min 2X/week        Progress Toward Goals  OT Goals(current goals can now be found in the care plan section)  Progress towards OT goals: Progressing toward goals  Acute Rehab OT Goals Patient Stated Goal: get better; go home OT Goal Formulation: With patient Time For Goal Achievement: 12/15/22 Potential to Achieve Goals: Good  Plan Discharge plan remains appropriate;Frequency remains appropriate     Co-evaluation                 AM-PAC OT "6 Clicks" Daily Activity     Outcome Measure   Help from another person eating meals?: None Help from another person taking care of personal grooming?: A Little Help from another person toileting, which includes using toliet, bedpan, or urinal?: A Little Help from another person bathing (including washing, rinsing, drying)?: A Little Help from another person to put on and taking off regular upper body clothing?: A Little Help from another person to put on and taking off regular lower body clothing?: A Little 6 Click Score: 19    End of Session Equipment Utilized During Treatment: Gait belt;Rolling walker (2 wheels)  OT Visit Diagnosis: Unsteadiness on feet (R26.81);Muscle weakness (generalized) (M62.81)   Activity Tolerance Patient tolerated treatment well   Patient Left in chair;with family/visitor present   Nurse Communication Other (comment) (SpO2 with mobility)        Time: 1047-1101 OT Time Calculation (min): 14 min  Charges: OT General Charges $OT Visit: 1 Visit OT Treatments $Therapeutic Activity: 8-22 mins  Arman Filter., MPH, MS, OTR/L ascom [phone] 12/07/22, 11:32 AM

## 2022-12-07 NOTE — Progress Notes (Signed)
[facility] CLINIC CARDIOLOGY CONSULT NOTE       Patient ID: Marcus Kemp MRN: [id number] DOB/AGE: June 17, 1925 89 y.o.  Admit date: 11/30/2022 Referring Physician Dr. Joylene Igo Primary Physician Dr. Judithann Sheen  Primary Cardiologist Dr. Darrold Junker Reason for Consultation acute on chronic HF   HPI: Marcus Kemp. 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  Cardiology is consulted for further assistance.  Interval history: -No acute events -Eager to go home -No chest pain or shortness of breath, peripheral edema at baseline -Net IO Since Admission: -7,998.89 mL [12/07/22 1505]    Review of systems complete and found to be negative unless listed above     Past Medical History:  Diagnosis Date   A-fib (HCC)    a.) CHA2DS2-VASc = 4 (age x 2, CHF, aortic plaque). b.) s/p unsuccessful TEE cardioversion 04/28/2016 --> rec'd 200 J x1 and 360 J x 2. c.) rate/rhythm maintained on oral metoprolol succinante; chronically anticoagulated using dose reduced apixaban.   Anemia    Aortic atherosclerosis (HCC)    Arthritis    CHF (congestive heart failure) (HCC)    a.) TTE 04/26/2016: EF >55%; mod LVH; MVP; mild-mod LAE, mild MR. b.) TTE 05/14/209: EF >55%; mod RVE; mild BAE; mod MR.TR; triv AR/PR. c.) TTE 03/28/2019: EF >55%; mild LVH; mod BAE; mild PR, mod MR/TR.   GERD (gastroesophageal reflux disease)    Heart murmur    History of kidney stones    HOH (hard of hearing)    a.) hearing aid to LEFT ear.   Hyperlipidemia    Hypertension    Infrarenal abdominal aortic aneurysm (AAA) without rupture (HCC) 05/05/2021   a.) CT 05/05/2021 --> measured 3.0 cm.   MVP (mitral valve prolapse)    Peripheral edema    Pneumonia    Pulmonary  embolus (HCC)    Pulmonary HTN (HCC) 04/26/2016   a.) TTE 04/26/2016: EF >55%; PASP 45. b.) TTE 12/06/2017: EF >55%; RVSP 76.6 mmHg. c.) TTE 03/28/2019: EF >55%; RVSP 77.8 mmHg.   Renal cell carcinoma (HCC)    Rheumatic fever    Sepsis (HCC)    Valvular heart disease     Past Surgical History:  Procedure Laterality Date   INGUINAL HERNIA REPAIR Left 01/13/2022   Procedure: HERNIA REPAIR INGUINAL ADULT;  Surgeon: Earline Mayotte, MD;  Location: [facility] ORS;  Service: General;  Laterality: Left;   INSERTION OF MESH  01/13/2022   Procedure: INSERTION OF MESH;  Surgeon: Earline Mayotte, MD;  Location: [facility] ORS;  Service: General;;   LITHOTRIPSY     TEE WITH CARDIOVERSION N/A    P   TONSILLECTOMY      No medications prior to admission.    Social History   Socioeconomic History   Marital status: Married    Spouse name: Not on file   Number of children: Not on file   Years of education: Not on file   Highest education level: Not on file  Occupational History   Not on file  Tobacco Use   Smoking status: Former    Packs/day: 0.25    Years: 8.00    Additional pack years: 0.00    Total pack years: 2.00    Types: Cigarettes    Quit date:  1940    Years since quitting: 84.4   Smokeless tobacco: Never  Vaping Use   Vaping Use: Never used  Substance and Sexual Activity   Alcohol use: No   Drug use: Never   Sexual activity: Not on file  Other Topics Concern   Not on file  Social History Narrative   Not on file   Social Determinants of Health   Financial Resource Strain: Not on file  Food Insecurity: No Food Insecurity (12/01/2022)   Hunger Vital Sign    Worried About Running Out of Food in the Last Year: Never true    Ran Out of Food in the Last Year: Never true  Transportation Needs: No Transportation Needs (12/01/2022)   PRAPARE - Administrator, Civil Service (Medical): No    Lack of Transportation (Non-Medical): No  Physical Activity: Not on file  Stress:  Not on file  Social Connections: Not on file  Intimate Partner Violence: Not At Risk (12/01/2022)   Humiliation, Afraid, Rape, and Kick questionnaire    Fear of Current or Ex-Partner: No    Emotionally Abused: No    Physically Abused: No    Sexually Abused: No    Family History  Problem Relation Age of Onset   Heart disease Sister    Hypertension Other       Intake/Output Summary (Last 24 hours) at 12/07/2022 1505 Last data filed at 12/07/2022 0040 Gross per 24 hour  Intake 480 ml  Output 750 ml  Net -270 ml     Vitals:   12/07/22 0407 12/07/22 0800 12/07/22 1127 12/07/22 1242  BP: 109/77 111/66  117/71  Pulse: 87   (!) 41  Resp: 18 18  14   Temp: 97.8 F (36.6 C) 97.8 F (36.6 C)  (!) 97.5 F (36.4 C)  TempSrc:  Oral    SpO2: 94%  90% 95%  Weight:        PHYSICAL EXAM General: Elderly and chronically ill-appearing thin Caucasian male.  Sitting upright in recliner with wife at bedside. HEENT:  Normocephalic and atraumatic. Hard of hearing. Neck:  No JVD.  Lungs: Normal respiratory effort on room air.  Decreased breath sounds with trace right basilar crackles. Heart: HRRR . Normal S1 and S2, 3/6 blowing holosystolic murmur best heard at the apex. Abdomen: Nontender to palpation, distended. Msk: Generalized weakness. Extremities: Chronic hyperpigmentation and woody edema, multiple excoriations and wounds with trace edema bilaterally, significant overall improvement. Neuro: Awake and alert Psych:  Answers questions appropriately.   Labs: Basic Metabolic Panel: Recent Labs    12/06/22 0527 12/07/22 0446  NA 136 135  K 3.8 3.6  CL 93* 96*  CO2 31 32  GLUCOSE 79 96  BUN 32* 31*  CREATININE 1.15 1.20  CALCIUM 8.5* 8.6*    Liver Function Tests: No results for input(s): "AST", "ALT", "ALKPHOS", "BILITOT", "PROT", "ALBUMIN" in the last 72 hours. No results for input(s): "LIPASE", "AMYLASE" in the last 72 hours. CBC: Recent Labs    12/07/22 0446  WBC 6.6   HGB 12.5*  HCT 37.0*  MCV 101.4*  PLT 81*    Cardiac Enzymes: No results for input(s): "CKTOTAL", "CKMB", "CKMBINDEX", "TROPONINIHS" in the last 72 hours.  BNP: No results for input(s): "BNP" in the last 72 hours.  D-Dimer: No results for input(s): "DDIMER" in the last 72 hours. Hemoglobin A1C: No results for input(s): "HGBA1C" in the last 72 hours. Fasting Lipid Panel: No results for input(s): "CHOL", "HDL", "LDLCALC", "TRIG", "CHOLHDL", "  LDLDIRECT" in the last 72 hours. Thyroid Function Tests: No results for input(s): "TSH", "T4TOTAL", "T3FREE", "THYROIDAB" in the last 72 hours.  Invalid input(s): "FREET3" Anemia Panel: No results for input(s): "VITAMINB12", "FOLATE", "FERRITIN", "TIBC", "IRON", "RETICCTPCT" in the last 72 hours.   Radiology: [facility] Chest Port 1 View  Result Date: 12/05/2022 CLINICAL DATA:  Cough. Technologist notes state recent choking. EXAM: PORTABLE CHEST 1 VIEW COMPARISON:  Chest radiograph 11/30/2022 FINDINGS: Stable cardiomegaly. Unchanged mediastinal contours. Small pleural effusions are similar or slightly improved. Increasing patchy opacity in the left mid lower lung zone. No pneumothorax. Stable osseous structures. IMPRESSION: 1. Increasing patchy opacity in the left mid and lower lung zone, may be atelectasis, aspiration, or pneumonia. 2. Stable cardiomegaly and small pleural effusions. Electronically Signed   By: Narda Rutherford M.D.   On: 12/05/2022 17:28   US RENAL  Result Date: 11/30/2022 CLINICAL DATA:  Acute kidney injury EXAM: RENAL / URINARY TRACT ULTRASOUND COMPLETE COMPARISON:  Renal ultrasound 09/09/2022.  CT abdomen 12/24/2021 FINDINGS: Right Kidney: Renal measurements: 10.1 x 4.8 x 4.5 cm = volume: 115 mL. No hydronephrosis. Increased echogenicity is seen throughout. There is a 1.7 x 1.4 x 1.8 cm cyst in the upper pole. Left Kidney: Renal measurements: 10.3 x 4.9 x 4.1 cm = volume: 107 mL. No hydronephrosis. Normal echogenicity. Are 2 cysts  identified in the upper pole measuring 2.1 x 1.0 x 2.0 cm and 1.1 x 1.0 x 1.2 cm. Bladder: Appears normal for degree of bladder distention. Other: There is trace free fluid around the liver and spleen. IMPRESSION: 1. No hydronephrosis. 2. Increased echogenicity in the right kidney is nonspecific but can be seen in the setting of medical renal disease. 3. Bilateral renal cysts. 4. Trace free fluid around the liver and spleen. Electronically Signed   By: Darliss Cheney M.D.   On: 11/30/2022 17:16   DG Chest 2 View  Result Date: 11/30/2022 CLINICAL DATA:  Shortness of breath. EXAM: CHEST - 2 VIEW COMPARISON:  October 31, 2022. FINDINGS: Stable cardiomegaly. Minimal bibasilar subsegmental atelectasis or edema is noted with minimal pleural effusions. Bony thorax is unremarkable. IMPRESSION: Minimal bibasilar subsegmental atelectasis or edema is noted with minimal bilateral pleural effusions. Aortic Atherosclerosis (ICD10-I70.0). Electronically Signed   By: Lupita Raider M.D.   On: 11/30/2022 11:46    ECHO 08/2022 1. Left ventricular ejection fraction, by estimation, is 55 to 60%. The  left ventricle has normal function. The left ventricle has no regional  wall motion abnormalities. There is moderate left ventricular hypertrophy.  Left ventricular diastolic  parameters are indeterminate. There is the interventricular septum is  flattened in systole and diastole, consistent with right ventricular  pressure and volume overload.   2. Right ventricular systolic function is mildly reduced. The right  ventricular size is moderately enlarged. There is severely elevated  pulmonary artery systolic pressure. The estimated right ventricular  systolic pressure is 70.4 mmHg.   3. Left atrial size was moderately dilated.   4. Right atrial size was mildly dilated.   5. The mitral valve is normal in structure. Moderate to severe mitral  valve regurgitation. No evidence of mitral stenosis.   6. Tricuspid valve  regurgitation is moderate to severe.   7. The aortic valve has an indeterminant number of cusps. Aortic valve  regurgitation is not visualized. Aortic valve sclerosis/calcification is  present, without any evidence of aortic stenosis.   8. There is borderline dilatation of the ascending aorta, measuring 36  mm.  9. The inferior vena cava is dilated in size with <50% respiratory  variability, suggesting right atrial pressure of 15 mmHg.   TELEMETRY reviewed by me (LT) 12/07/2022 : Atrial fibrillation rate 80s-90s with occasional PVCs EKG reviewed by me: Sinus rhythm first-degree AV block nonspecific T wave abnormality  Data reviewed by me (LT) 12/07/2022: Hospitalist progress note, PT note, palliative care note nursing notes last 24h vitals tele labs imaging I/O   Principal Problem:   Acute on chronic heart failure with preserved ejection fraction (HFpEF) (HCC) Active Problems:   Longstanding persistent atrial fibrillation (HCC)   Rheumatic mitral regurgitation   Macrocytic anemia   Pulmonary embolism (HCC)   Thrombocytopenia (HCC)   Hematuria   AKI (acute kidney injury) (HCC)   Hypotension   Hypokalemia    ASSESSMENT AND PLAN:  Marcus Kemp "Marcus Kemp" is a 89yoM with a PMH of HFpEF (>55%), 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  Cardiology is consulted for further assistance.  # Acute on chronic HFpEF # Severe rheumatic MR Presents with significant peripheral edema extending from his feet up into his groin and abdomen despite adjustment of his torsemide on an outpatient basis, limiting his ability to ambulate due to the weight of his legs. Dietary indiscretions with potato chips likely contributory, but family does their best to ensure that the patient appears  to his salt and fluid restricted diet.  On admission BNP elevated to 1800 and chest x-ray with pleural effusions.  He is clinically hypervolemic on exam today, although with clinical improvement after initial diuresis. Weight down 10 kg since 5/8.  -S/p IV Lasix 40 mg x 1 in ED and s/p Lasix drip for multiple days  -continue torsemide 40 mg in the morning and 20 mg at night  -Continue to hold metoprolol with borderline blood pressures - Monitor and replenish electrolytes for a goal K >4, mag >2 -Additional GDMT limited by hypotension and renal insufficiency -Strict salt and fluid restriction, strict I/O's -Defer additional cardiac diagnostics at this time. -Appreciate palliative care assistance.  # Paroxysmal atrial fibrillation In sinus rhythm on telemetry today with controlled rate, did convert to AF RVR overnight on 5/11. Continue Eliquis 5 mg twice daily for stroke prevention, and with history of recent pulmonary embolus in February 2024.  Note for signs/symptoms of bleeding, as he has a history of complications with full dose Eliquis  # AKI Renal function improving after diuresis.  # Demand ischemia Borderline elevated at 59, which in the absence of chest pain or EKG changes in the setting of acute on chronic HFpEF as above, this is most consistent with demand/supply mismatch and not ACS   [facility] for discharge today from a cardiac perspective. Will arrange for follow up in clinic with Leanora Ivanoff, PA-C in 1-2 weeks.    This patient's plan of care was discussed and created with Dr. Darrold Junker and he is in agreement.  Signed: Rebeca Allegra , PA-C 12/07/2022, 3:05 PM [facility]

## 2022-12-09 IMAGING — CT CT MAXILLOFACIAL W/O CM
3 series · 16 of 47 positions shown, 19 images · non-contrast
Comparison: CT head 07/09/2020.

CLINICAL DATA: Multiple falls recently. Injury to left
maxillofacial area. Patient chipped tooth.

EXAM:
CT MAXILLOFACIAL WITHOUT CONTRAST
TECHNIQUE: Multidetector CT imaging of the maxillofacial structures was
performed. Multiplanar CT image reconstructions were also generated.

[Series 2: max soft (person_name) · axial · 0.33mm/px · z∈[-210,-62]mm · 10 of 86 slices shown, 13 images]
[im 6/86  brain]
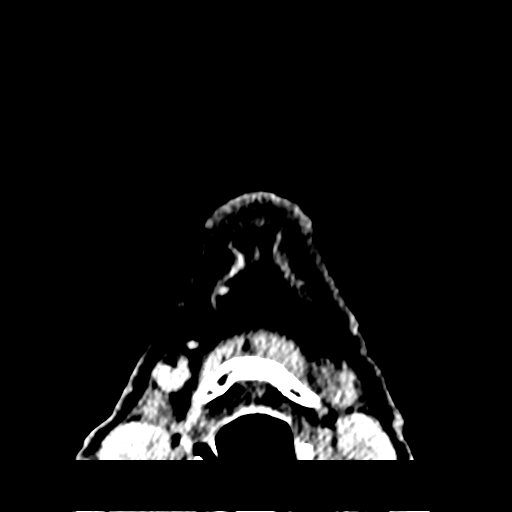
[im 6/86  bone]
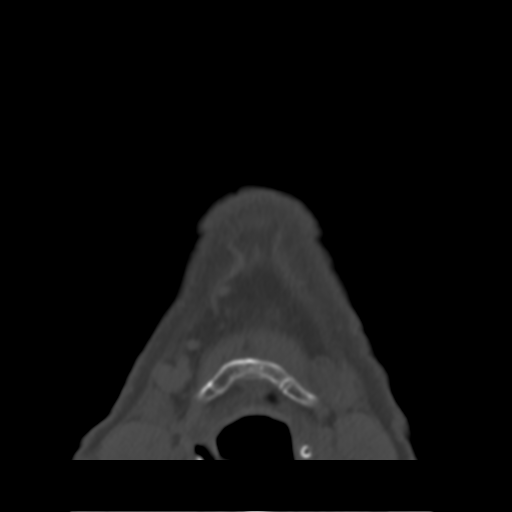
[im 15/86  bone]
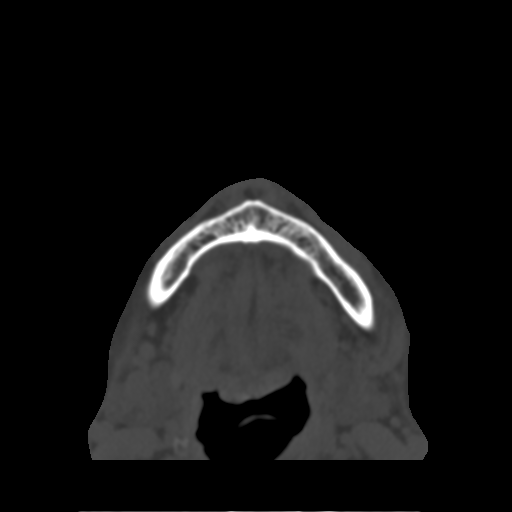
[im 24/86  bone]
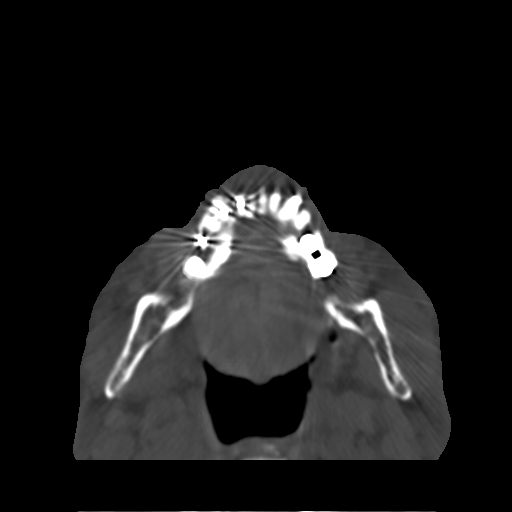
[im 30/86  bone]
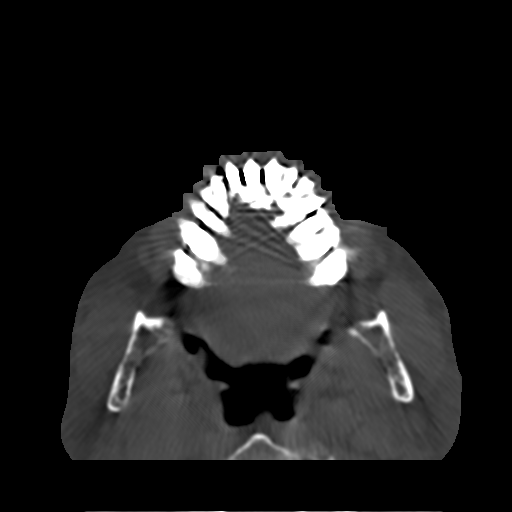
[im 39/86  brain]
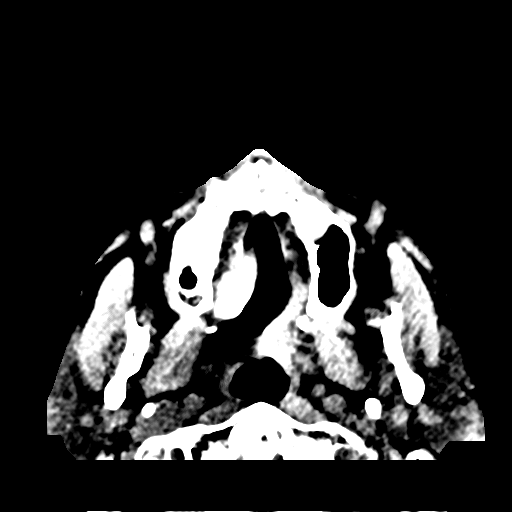
[im 39/86  bone]
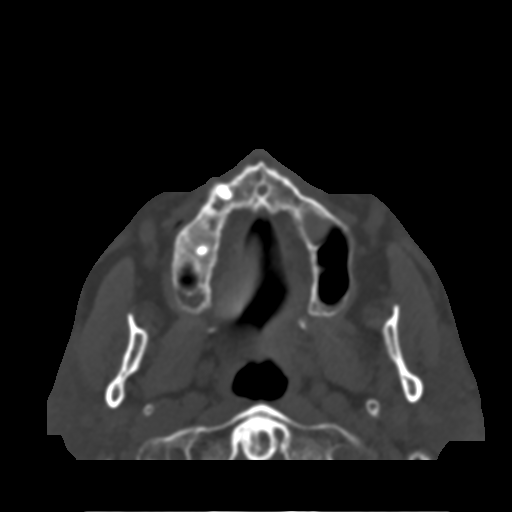
[im 47/86  bone]
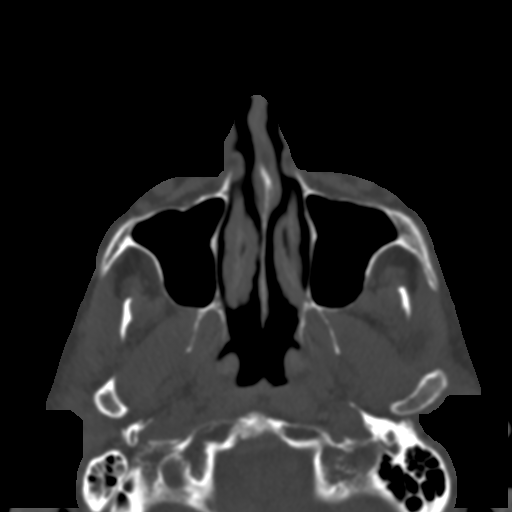
[im 56/86  bone]
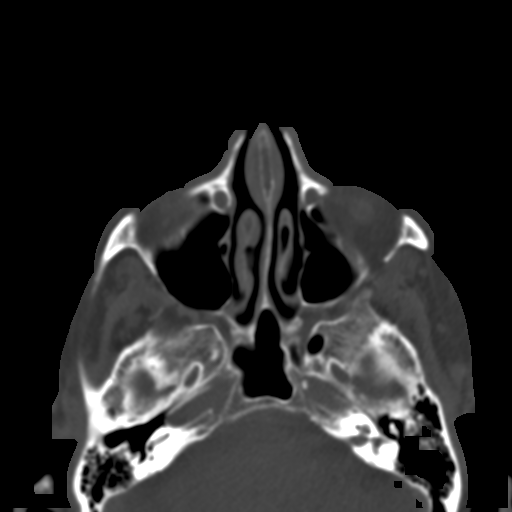
[im 65/86  bone]
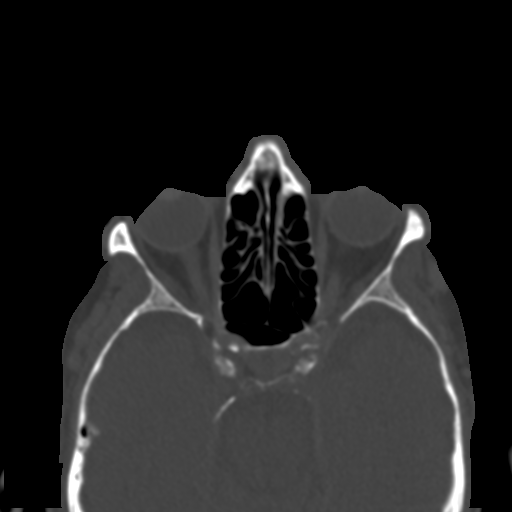
[im 71/86  brain]
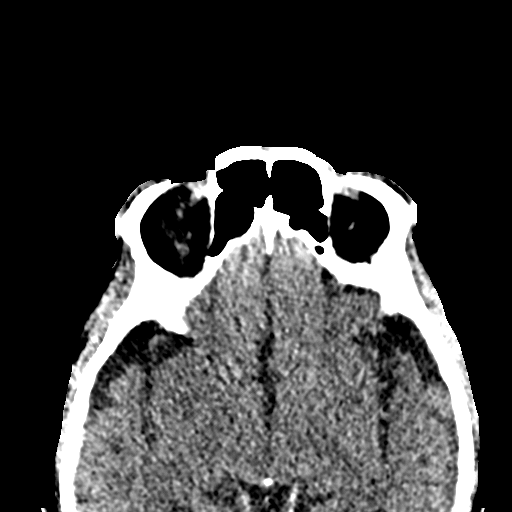
[im 71/86  bone]
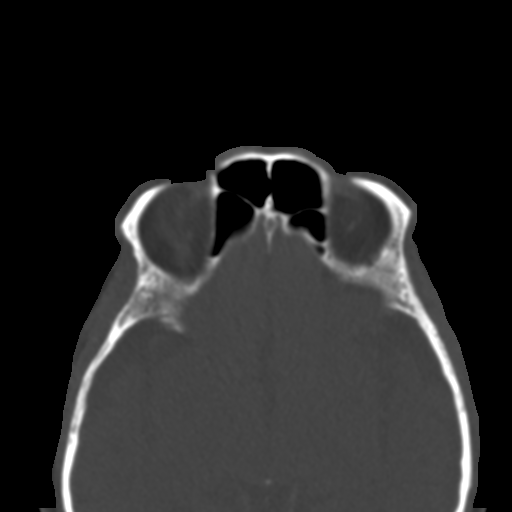
[im 80/86  bone]
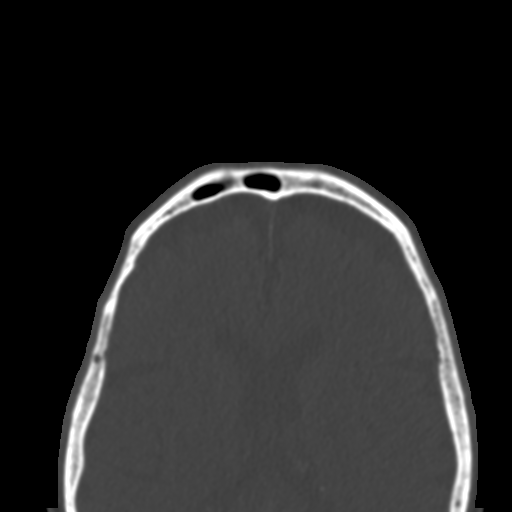

[Series 4: coronal soft · coronal · 0.36mm/px · 3 of 79 slices shown]
[im 27/79  bone]
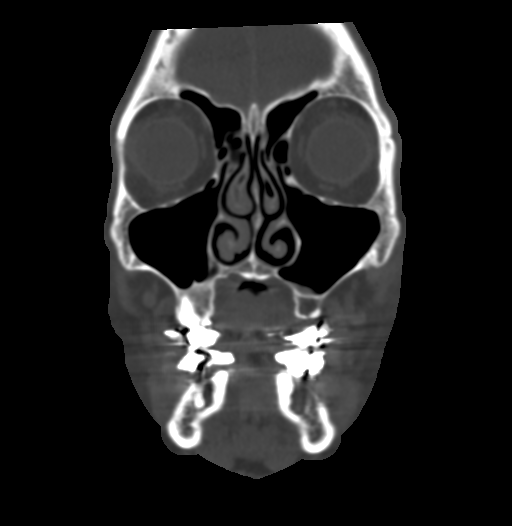
[im 35/79  bone]
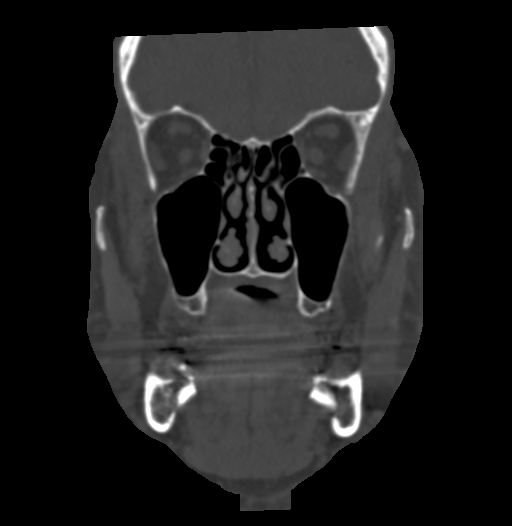
[im 44/79  bone]
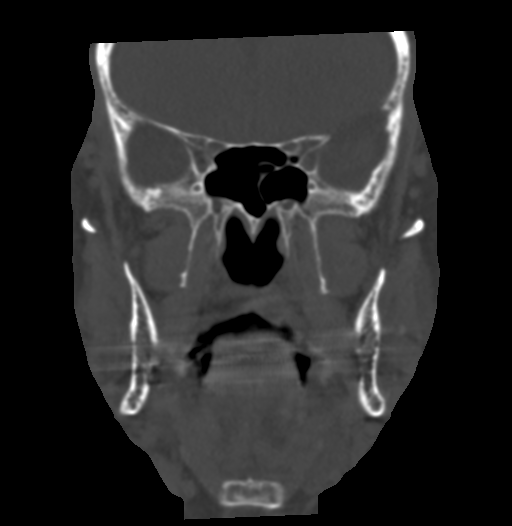

[Series 5: sagittal soft · sagittal · 0.29mm/px · 3 of 81 slices shown]
[im 27/81  bone]
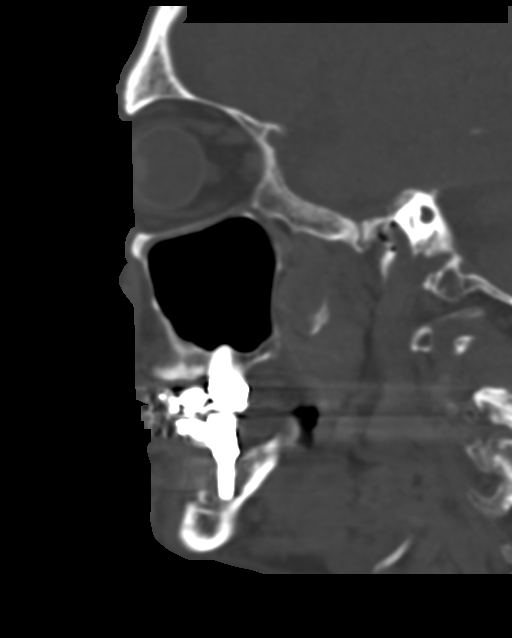
[im 41/81  bone]
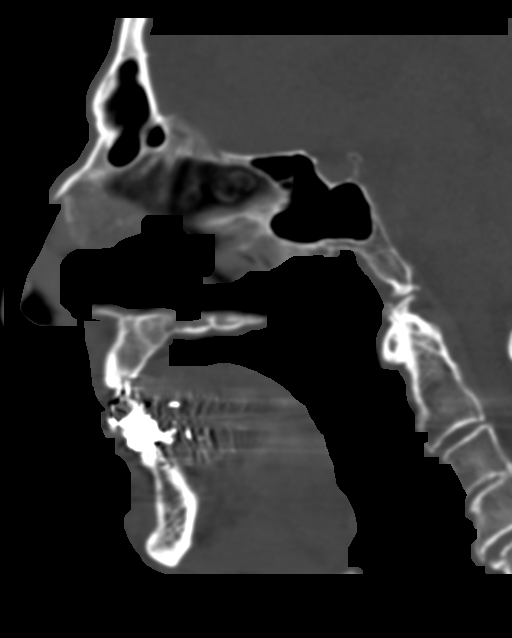
[im 54/81  bone]
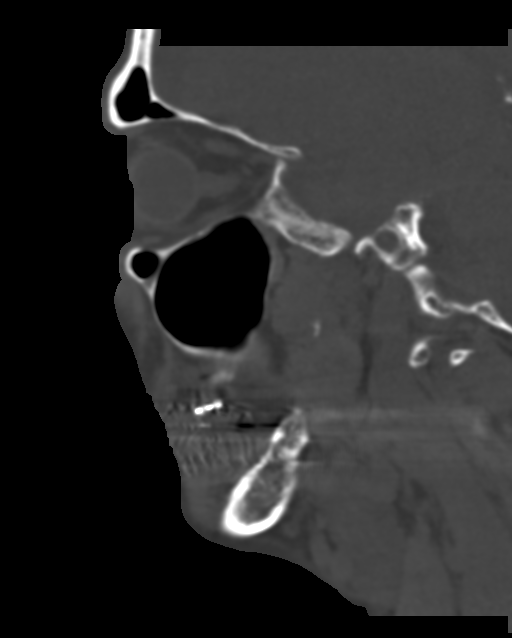

[16 of 47 positions shown; findings below may reference images not displayed]

FINDINGS: Osseous: Limited evaluation of the maxillary and mandibular areas
due to streak artifact originating from dental hardware. Slightly
leftward deviated nasal septum. No fracture or mandibular
dislocation. No destructive process.

Orbits: Negative. No traumatic or inflammatory finding.

Sinuses: Mild mucosal thickening of the left maxillary sinus.
Otherwise unremarkable paranasal sinuses. Partial opacification of
the right mastoid air cells again noted. The left mastoid air cells
are clear.

Soft tissues: No significant subcutaneus soft tissue hematoma. Mild
soft tissue edema of the left periorbital and maxillofacial
subcutaneus soft tissues.

Limited intracranial: Atherosclerotic calcifications are present
within the cavernous internal carotid arteries.
IMPRESSION: No acute displaced facial fracture.

## 2022-12-14 ENCOUNTER — Ambulatory Visit: Payer: Medicare Other | Admitting: Family

## 2022-12-15 ENCOUNTER — Ambulatory Visit: Admit: 2022-12-15 | Payer: Medicare Other | Admitting: Ophthalmology

## 2022-12-15 SURGERY — PHACOEMULSIFICATION, CATARACT, WITH IOL INSERTION
Anesthesia: Topical | Laterality: Right

## 2022-12-22 ENCOUNTER — Ambulatory Visit: Payer: Medicare Other | Attending: Family | Admitting: Family

## 2022-12-22 ENCOUNTER — Encounter: Payer: Self-pay | Admitting: Family

## 2022-12-22 VITALS — BP 94/55 | HR 59 | Wt 120.4 lb

## 2022-12-22 DIAGNOSIS — I4891 Unspecified atrial fibrillation: Secondary | ICD-10-CM | POA: Diagnosis not present

## 2022-12-22 DIAGNOSIS — Z85528 Personal history of other malignant neoplasm of kidney: Secondary | ICD-10-CM | POA: Diagnosis not present

## 2022-12-22 DIAGNOSIS — Z7984 Long term (current) use of oral hypoglycemic drugs: Secondary | ICD-10-CM | POA: Insufficient documentation

## 2022-12-22 DIAGNOSIS — I5032 Chronic diastolic (congestive) heart failure: Secondary | ICD-10-CM | POA: Insufficient documentation

## 2022-12-22 DIAGNOSIS — Z79899 Other long term (current) drug therapy: Secondary | ICD-10-CM | POA: Diagnosis not present

## 2022-12-22 DIAGNOSIS — E785 Hyperlipidemia, unspecified: Secondary | ICD-10-CM | POA: Insufficient documentation

## 2022-12-22 DIAGNOSIS — I10 Essential (primary) hypertension: Secondary | ICD-10-CM

## 2022-12-22 DIAGNOSIS — Z862 Personal history of diseases of the blood and blood-forming organs and certain disorders involving the immune mechanism: Secondary | ICD-10-CM | POA: Insufficient documentation

## 2022-12-22 DIAGNOSIS — I272 Pulmonary hypertension, unspecified: Secondary | ICD-10-CM | POA: Diagnosis not present

## 2022-12-22 DIAGNOSIS — K219 Gastro-esophageal reflux disease without esophagitis: Secondary | ICD-10-CM | POA: Diagnosis not present

## 2022-12-22 DIAGNOSIS — I4811 Longstanding persistent atrial fibrillation: Secondary | ICD-10-CM | POA: Diagnosis not present

## 2022-12-22 DIAGNOSIS — I428 Other cardiomyopathies: Secondary | ICD-10-CM | POA: Insufficient documentation

## 2022-12-22 DIAGNOSIS — N189 Chronic kidney disease, unspecified: Secondary | ICD-10-CM | POA: Insufficient documentation

## 2022-12-22 DIAGNOSIS — Z86711 Personal history of pulmonary embolism: Secondary | ICD-10-CM | POA: Diagnosis not present

## 2022-12-22 DIAGNOSIS — Z7901 Long term (current) use of anticoagulants: Secondary | ICD-10-CM | POA: Insufficient documentation

## 2022-12-22 DIAGNOSIS — I341 Nonrheumatic mitral (valve) prolapse: Secondary | ICD-10-CM | POA: Insufficient documentation

## 2022-12-22 DIAGNOSIS — I130 Hypertensive heart and chronic kidney disease with heart failure and stage 1 through stage 4 chronic kidney disease, or unspecified chronic kidney disease: Secondary | ICD-10-CM | POA: Insufficient documentation

## 2022-12-22 DIAGNOSIS — Z87891 Personal history of nicotine dependence: Secondary | ICD-10-CM | POA: Insufficient documentation

## 2022-12-22 DIAGNOSIS — I5030 Unspecified diastolic (congestive) heart failure: Secondary | ICD-10-CM | POA: Diagnosis present

## 2022-12-22 DIAGNOSIS — Z8679 Personal history of other diseases of the circulatory system: Secondary | ICD-10-CM | POA: Diagnosis not present

## 2022-12-22 NOTE — Patient Instructions (Signed)
Stop taking the metoprolol

## 2022-12-22 NOTE — Progress Notes (Signed)
PCP: Aram Beecham, MD (last seen 04/24) Primary Cardiologist: Marcina Millard, MD (last seen 05/24)  HPI:  Marcus Kemp is a 89 y/o male with a history of AAA, hyperlipidemia, mitral valve prolapse, HTN, CKD, atrial fibrillation, anemia, GERD, PE, renal cell carcinoma, rheumatic heart disease, pulmonary HTN and chronic heart failure. Had unsuccessful cardioversion 04/29/16 @ [facility].    Echo 09/10/22: EF of 55-60% along with moderate LVH along with severely elevated PA pressure of 70.4 mmHg, moderate LAE and moderate/ severe Marcus/TR.   Admitted 11/30/22 due to a/c heart failure exacerbation. Chest x-ray showed features of pulmonary edema, minimal bilateral pleural effusion. IV diuresed transitioned to oral diuretics. Was in the ED 10/31/22 due to pedal edema. IV lasix given. Admitted 09/09/22 due to pedal edema due to HF exacerbation. Diuresed. D-dimer elevated and CTA + for PE with right heart strain RV:LV 1.8, cardiac enlargement with bilateral pleural effusions and probable interstitial edema. Heparin gtt and then placed on eliquis. Midodrine started.   He presents today for a HF f/u visit with a chief complaint of moderate fatigue with minimal exertion. Chronic in nature. Has associated shortness of breath and dizziness along with this. Denies any chest pain, palpitations, pedal edema, abdominal distention, weight gain or difficulty sleeping. Does report a decreased appetite since his admission earlier this month. Using NoSalt for seasoning.   Was supposed to have stopped metoprolol at discharge but it has been continued. Wife did give patient a dose of midodrine this morning due to low blood pressure.   ROS: All systems negative except as listed in HPI, PMH and Problem List.  SH:  Social History   Socioeconomic History   Marital status: Married    Spouse name: Not on file   Number of children: Not on file   Years of education: Not on file   Highest education level: Not on file  Occupational  History   Not on file  Tobacco Use   Smoking status: Former    Packs/day: 0.25    Years: 8.00    Additional pack years: 0.00    Total pack years: 2.00    Types: Cigarettes    Quit date: 48    Years since quitting: 84.4   Smokeless tobacco: Never  Vaping Use   Vaping Use: Never used  Substance and Sexual Activity   Alcohol use: No   Drug use: Never   Sexual activity: Not on file  Other Topics Concern   Not on file  Social History Narrative   Not on file   Social Determinants of Health   Financial Resource Strain: Not on file  Food Insecurity: No Food Insecurity (12/01/2022)   Hunger Vital Sign    Worried About Running Out of Food in the Last Year: Never true    Ran Out of Food in the Last Year: Never true  Transportation Needs: No Transportation Needs (12/01/2022)   PRAPARE - Administrator, Civil Service (Medical): No    Lack of Transportation (Non-Medical): No  Physical Activity: Not on file  Stress: Not on file  Social Connections: Not on file  Intimate Partner Violence: Not At Risk (12/01/2022)   Humiliation, Afraid, Rape, and Kick questionnaire    Fear of Current or Ex-Partner: No    Emotionally Abused: No    Physically Abused: No    Sexually Abused: No    FH:  Family History  Problem Relation Age of Onset   Heart disease Sister    Hypertension Other  Past Medical History:  Diagnosis Date   A-fib [facility])    a.) CHA2DS2-VASc = 4 (age x 2, CHF, aortic plaque). b.) s/p unsuccessful TEE cardioversion 04/28/2016 --> rec'd 200 J x1 and 360 J x 2. c.) rate/rhythm maintained on oral metoprolol succinante; chronically anticoagulated using dose reduced apixaban.   Anemia    Aortic atherosclerosis (HCC)    Arthritis    CHF (congestive heart failure) (HCC)    a.) TTE 04/26/2016: EF >55%; mod LVH; MVP; mild-mod LAE, mild Marcus. b.) TTE 05/14/209: EF >55%; mod RVE; mild BAE; mod Marcus.TR; triv AR/PR. c.) TTE 03/28/2019: EF >55%; mild LVH; mod BAE; mild PR, mod  Marcus/TR.   GERD (gastroesophageal reflux disease)    Heart murmur    History of kidney stones    HOH (hard of hearing)    a.) hearing aid to LEFT ear.   Hyperlipidemia    Hypertension    Infrarenal abdominal aortic aneurysm (AAA) without rupture (HCC) 05/05/2021   a.) CT 05/05/2021 --> measured 3.0 cm.   MVP (mitral valve prolapse)    Peripheral edema    Pneumonia    Pulmonary embolus (HCC)    Pulmonary HTN (HCC) 04/26/2016   a.) TTE 04/26/2016: EF >55%; PASP 45. b.) TTE 12/06/2017: EF >55%; RVSP 76.6 mmHg. c.) TTE 03/28/2019: EF >55%; RVSP 77.8 mmHg.   Renal cell carcinoma (HCC)    Rheumatic fever    Sepsis (HCC)    Valvular heart disease     Current Outpatient Medications  Medication Sig Dispense Refill   apixaban (ELIQUIS) 5 MG TABS tablet Take 5 mg by mouth 2 (two) times daily.     Azelastine HCl 137 MCG/SPRAY SOLN Place 1 spray into the nose 2 (two) times daily.     cetirizine (ZYRTEC) 10 MG tablet SMARTSIG:1 pill By Mouth Daily PRN     empagliflozin (JARDIANCE) 10 MG TABS tablet Take 1 tablet (10 mg total) by mouth daily. 30 tablet 5   fluorouracil (EFUDEX) 5 % cream Apply 1 Application topically 2 (two) times daily.     midodrine (PROAMATINE) 5 MG tablet Take 5 mg by mouth as needed. For SBP >100     rOPINIRole (REQUIP) 1 MG tablet Take 1 mg by mouth at bedtime.     torsemide (DEMADEX) 20 MG tablet 40mg  in the morning and 20mg  in the evening 90 tablet 3   traZODone (DESYREL) 50 MG tablet Take 50 mg by mouth at bedtime.     vitamin B-12 (CYANOCOBALAMIN) 500 MCG tablet Take 1 tablet (500 mcg total) by mouth daily. 90 tablet 3   No current facility-administered medications for this visit.   Vitals:   12/22/22 1034  BP: (!) 94/55  Pulse: (!) 59  SpO2: 99%  Weight: 120 lb 6.4 oz (54.6 kg)   Wt Readings from Last 3 Encounters:  12/22/22 120 lb 6.4 oz (54.6 kg)  12/06/22 121 lb (54.9 kg)  11/17/22 146 lb (66.2 kg)   Lab Results  Component Value Date   CREATININE 1.20  12/07/2022   CREATININE 1.15 12/06/2022   CREATININE 1.26 (H) 12/05/2022   PHYSICAL EXAM:  General:  Well appearing. No resp difficulty HEENT: normal Neck: supple. JVP flat.  No lymphadenopathy or thryomegaly appreciated. Cor: PMI normal. Regular rhythm, bradycardic. No rubs, gallops or murmurs. Lungs: clear Abdomen: soft, nontender, nondistended. No hepatosplenomegaly. No bruits or masses.  Extremities: no cyanosis, clubbing, rash, trace pitting edema bilateral lower legs Neuro: alert & orientedx3, cranial nerves grossly intact. Moves  all 4 extremities w/o difficulty. Affect pleasant.   ECG: not done   ASSESSMENT & PLAN:  1: NICM with preserved ejection fraction- - likely due to a fib - NYHA class III - euvolemic today - weighing daily; reminded to call for an overnight weight gain of > 2 pounds or a weekly weight gain of > 5 pounds - weight down 26 pounds from last visit here 1 month ago; HF admission earlier this month and diuresed - echo 09/10/22: EF of 55-60% along with moderate LV along with severely elevated PA pressure of 70.4 mmHg, moderate LAE and moderate/ severe Marcus/TR.  - not adding salt to his food & is using NoSalt for seasoning - stop metoprolol succinate 25mg  daily; will resume if HR rises - continue jardiance 10mg  daily - continue torsemide 40mg  AM/ 20mg  PM - BNP 11/30/22 was 1813.4 - PharmD reconciled meds with the patient/ wife  2: HTN- - BP 94/55; stopping metoprolol per above - midodrine 5mg  TID PRN for SBP <100; took a dose this morning - saw PCP ([facility]) 04/24 - BMP 12/07/22 showed sodium 135, potassium 3.6, creatinine 1.2 & GFR 56  3: Atrial fibrillation-  - saw cardiology ([facility]) 05/24 - continue eliquis 5mg  BID - unsuccessful cardioversion done 04/2016   Return in 1 month, sooner if needed.

## 2022-12-22 NOTE — Progress Notes (Signed)
[facility] HEART FAILURE CLINIC - Pharmacist Note  Marcus Kemp is a 89 y.o. male with HFpEF (EF >50%) presenting to the Heart Failure Clinic for follow up. He is here with his wife today. She reports no issues on current regimen. She reports that he weighs himself daily. Patient's weight has down trended recently since his appetite has decreased about a month ago. They report no other signs or symptoms of volume overload.  Recent ED Visit (past 6 months):  Date: 11/30/2022, CC: leg swelling Date: 10/31/2022, CC: groin swelling, leg swelling Date: 09/09/2022, CC: leg swelling  Guideline-Directed Medical Therapy/Evidence Based Medicine ACE/ARB/ARNI: none Beta Blocker: Metoprolol succinate 25 mg daily Aldosterone Antagonist: none Diuretic: Torsemide 40 mg every morning and 20 mg every evening SGLT2i: Empagliflozin 10 mg daily  Adherence Assessment Do you ever forget to take your medication? [] Yes [x] No  Do you ever skip doses due to side effects? [] Yes [x] No  Do you have trouble affording your medicines? [] Yes [x] No  Are you ever unable to pick up your medication due to transportation difficulties? [] Yes [x] No  Do you ever stop taking your medications because you don't believe they are helping? [] Yes [x] No  Do you check your weight daily? [x] Yes [] No  Adherence strategy: wife manages medications, pill box Barriers to obtaining medications: none reported  Diagnostics ECHO: Date 09/10/2022, EF 55-60%, no RWMA, moderate LVH  Vitals    12/07/2022   12:42 PM 12/07/2022    8:00 AM 12/07/2022    4:07 AM  Vitals with BMI  Systolic 117 111 956  Diastolic 71 66 77  Pulse 41  87     Recent Labs    Latest Ref Rng & Units 12/07/2022    4:46 AM 12/06/2022    5:27 AM 12/05/2022    5:29 AM  BMP  Glucose 70 - 99 mg/dL 96  79  95   BUN 8 - 23 mg/dL 31  32  36   Creatinine 0.61 - 1.24 mg/dL 2.13  0.86  5.78   Sodium 135 - 145 mmol/L 135  136  136   Potassium 3.5 - 5.1 mmol/L 3.6  3.8  3.6    Chloride 98 - 111 mmol/L 96  93  96   CO2 22 - 32 mmol/L 32  31  32   Calcium 8.9 - 10.3 mg/dL 8.6  8.5  8.2     Past Medical History Past Medical History:  Diagnosis Date   A-fib (HCC)    a.) CHA2DS2-VASc = 4 (age x 2, CHF, aortic plaque). b.) s/p unsuccessful TEE cardioversion 04/28/2016 --> rec'd 200 J x1 and 360 J x 2. c.) rate/rhythm maintained on oral metoprolol succinante; chronically anticoagulated using dose reduced apixaban.   Anemia    Aortic atherosclerosis (HCC)    Arthritis    CHF (congestive heart failure) (HCC)    a.) TTE 04/26/2016: EF >55%; mod LVH; MVP; mild-mod LAE, mild MR. b.) TTE 05/14/209: EF >55%; mod RVE; mild BAE; mod MR.TR; triv AR/PR. c.) TTE 03/28/2019: EF >55%; mild LVH; mod BAE; mild PR, mod MR/TR.   GERD (gastroesophageal reflux disease)    Heart murmur    History of kidney stones    HOH (hard of hearing)    a.) hearing aid to LEFT ear.   Hyperlipidemia    Hypertension    Infrarenal abdominal aortic aneurysm (AAA) without rupture (HCC) 05/05/2021   a.) CT 05/05/2021 --> measured 3.0 cm.   MVP (mitral valve prolapse)    Peripheral  edema    Pneumonia    Pulmonary embolus (HCC)    Pulmonary HTN (HCC) 04/26/2016   a.) TTE 04/26/2016: EF >55%; PASP 45. b.) TTE 12/06/2017: EF >55%; RVSP 76.6 mmHg. c.) TTE 03/28/2019: EF >55%; RVSP 77.8 mmHg.   Renal cell carcinoma (HCC)    Rheumatic fever    Sepsis (HCC)    Valvular heart disease     Plan Continue regimen as directed by NP Discontinue metoprolol in the setting of hypotension and bradycardia GDMT limited by hypotension and bradycardia Annual echo due 08/2023  Time spent: 15 minutes  Celene Squibb, PharmD PGY1 Pharmacy Resident 12/22/2022 10:33 AM

## 2022-12-30 ENCOUNTER — Ambulatory Visit
Admission: RE | Admit: 2022-12-30 | Discharge: 2022-12-30 | Disposition: A | Discharge status: Home / Self Care | Payer: Medicare Other | Source: Ambulatory Visit | Attending: Oncology | Admitting: Oncology

## 2022-12-30 DIAGNOSIS — N289 Disorder of kidney and ureter, unspecified: Secondary | ICD-10-CM | POA: Diagnosis present

## 2022-12-30 MED ORDER — IOHEXOL 300 MG/ML  SOLN
80.0000 mL | Freq: Once | INTRAMUSCULAR | Status: AC | PRN
  Administered 2022-12-30: 80 mL via INTRAVENOUS

## 2022-12-31 ENCOUNTER — Other Ambulatory Visit: Payer: Self-pay

## 2022-12-31 DIAGNOSIS — N289 Disorder of kidney and ureter, unspecified: Secondary | ICD-10-CM

## 2023-01-03 ENCOUNTER — Encounter: Payer: Self-pay | Admitting: Oncology

## 2023-01-03 ENCOUNTER — Inpatient Hospital Stay: Payer: Medicare Other | Attending: Oncology

## 2023-01-03 ENCOUNTER — Inpatient Hospital Stay (HOSPITAL_BASED_OUTPATIENT_CLINIC_OR_DEPARTMENT_OTHER): Payer: Medicare Other | Admitting: Oncology

## 2023-01-03 VITALS — BP 86/53 | HR 81 | Temp 97.3°F | Resp 18 | Wt 112.3 lb

## 2023-01-03 DIAGNOSIS — Z79899 Other long term (current) drug therapy: Secondary | ICD-10-CM | POA: Insufficient documentation

## 2023-01-03 DIAGNOSIS — Z86711 Personal history of pulmonary embolism: Secondary | ICD-10-CM | POA: Insufficient documentation

## 2023-01-03 DIAGNOSIS — Z7901 Long term (current) use of anticoagulants: Secondary | ICD-10-CM | POA: Diagnosis not present

## 2023-01-03 DIAGNOSIS — N401 Enlarged prostate with lower urinary tract symptoms: Secondary | ICD-10-CM | POA: Diagnosis not present

## 2023-01-03 DIAGNOSIS — N289 Disorder of kidney and ureter, unspecified: Secondary | ICD-10-CM

## 2023-01-03 DIAGNOSIS — D539 Nutritional anemia, unspecified: Secondary | ICD-10-CM | POA: Insufficient documentation

## 2023-01-03 DIAGNOSIS — Z87891 Personal history of nicotine dependence: Secondary | ICD-10-CM | POA: Insufficient documentation

## 2023-01-03 LAB — CBC WITH DIFFERENTIAL (CANCER CENTER ONLY)
Abs Immature Granulocytes: 0.02 10*3/uL (ref 0.00–0.07)
Basophils Absolute: 0.1 10*3/uL (ref 0.0–0.1)
Basophils Relative: 1 %
Eosinophils Absolute: 0.2 10*3/uL (ref 0.0–0.5)
Eosinophils Relative: 4 %
HCT: 40.5 % (ref 39.0–52.0)
Hemoglobin: 13.3 g/dL (ref 13.0–17.0)
Immature Granulocytes: 0 %
Lymphocytes Relative: 25 %
Lymphs Abs: 1.3 10*3/uL (ref 0.7–4.0)
MCH: 34 pg (ref 26.0–34.0)
MCHC: 32.8 g/dL (ref 30.0–36.0)
MCV: 103.6 fL — ABNORMAL HIGH (ref 80.0–100.0)
Monocytes Absolute: 0.6 10*3/uL (ref 0.1–1.0)
Monocytes Relative: 11 %
Neutro Abs: 3.1 10*3/uL (ref 1.7–7.7)
Neutrophils Relative %: 59 %
Platelet Count: 132 10*3/uL — ABNORMAL LOW (ref 150–400)
RBC: 3.91 MIL/uL — ABNORMAL LOW (ref 4.22–5.81)
RDW: 17.3 % — ABNORMAL HIGH (ref 11.5–15.5)
WBC Count: 5.3 10*3/uL (ref 4.0–10.5)
nRBC: 0 % (ref 0.0–0.2)

## 2023-01-03 LAB — CMP (CANCER CENTER ONLY)
ALT: 16 U/L (ref 0–44)
AST: 29 U/L (ref 15–41)
Albumin: 3.6 g/dL (ref 3.5–5.0)
Alkaline Phosphatase: 109 U/L (ref 38–126)
Anion gap: 10 (ref 5–15)
BUN: 31 mg/dL — ABNORMAL HIGH (ref 8–23)
CO2: 35 mmol/L — ABNORMAL HIGH (ref 22–32)
Calcium: 9 mg/dL (ref 8.9–10.3)
Chloride: 92 mmol/L — ABNORMAL LOW (ref 98–111)
Creatinine: 1.22 mg/dL (ref 0.61–1.24)
GFR, Estimated: 55 mL/min — ABNORMAL LOW
Glucose, Bld: 84 mg/dL (ref 70–99)
Potassium: 4.1 mmol/L (ref 3.5–5.1)
Sodium: 137 mmol/L (ref 135–145)
Total Bilirubin: 1.6 mg/dL — ABNORMAL HIGH (ref 0.3–1.2)
Total Protein: 7.7 g/dL (ref 6.5–8.1)

## 2023-01-03 LAB — FOLATE: Folate: 13.6 ng/mL (ref >5.9)

## 2023-01-03 LAB — VITAMIN B12: Vitamin B-12: 700 pg/mL (ref 180–914)

## 2023-01-03 NOTE — Progress Notes (Addendum)
Hematology/Oncology progress note [phone] [phone]  CHIEF COMPLAINTS/REASON FOR VISIT:  Follow-up for renal mass.  ASSESSMENT & PLAN:   Kidney lesion Left kidney lesion, slowly increasing size.  1.9 x 1.7 cm. With patient's advanced age, other comorbidities, he is not interested in surgery or biopsy. 12/30/2022, CT scan results pending at time of dictation. I recommend to continue surveillance if patient size is stable or only slightly increased.  Patient and wife agree with the plan.  01/31/2023, addendum CT results reviewed and discussed with patient.  Slightly increased kidney lesion size at 2 cm x 2 cm.  Necrotic appearing aortocaval lymph node, for metastasis.  CT imaging results were reviewed with patient and wife over the phone.  Patient and his wife are not interested in any surgery or biopsy.  They declined urology referral.  I also discussed about a sooner CT in 6 months for follow-up.  Patient and wife prefer to keep neck CT at 1 year.  Macrocytic anemia Macrocytosis, stable hemoglobin.  Normal vitamin B12 and folate level. Increased LDH likely due to increased RBC turnover secondary to destruction from heart valve disease or presumed kidney cancer.  Given patient's advanced age, other comorbidities, I recommend no further workup. Continue observation.  Orders Placed This Encounter  Procedures   CMP (Cancer Center only)    Standing Status:   Future    Standing Expiration Date:   01/03/2024   CBC with Differential (Cancer Center Only)    Standing Status:   Future    Standing Expiration Date:   01/03/2024   Vitamin B12    Standing Status:   Future    Standing Expiration Date:   01/03/2024   Folate    Standing Status:   Future    Standing Expiration Date:   01/03/2024   Follow-up in 1 year. All questions were answered. The patient knows to call the clinic with any problems, questions or concerns.  Rickard Patience, MD, PhD [facility] Hematology  Oncology 01/03/2023    HISTORY OF PRESENTING ILLNESS:   MARTINIANO HUE is a  89 y.o.  male with PMH listed below was seen in consultation at the request of  Marguarite Arbour, MD  for evaluation of renal mass  Patient follows up with [place] urology Dr. Sheppard Penton for left inguinal hernia, left renal mass, BPH. 07/04/2020, patient had a CT hematuria work-up which showed bilateral nephrolithiasis without obstructive uropathy. Apically enhancing posterior interpolar left renal lesion consistent with renal cell carcinoma.  No metastatic disease or left renal vein involvement.  Tiny bilateral pleural effusion, elevated right heart pressure evidenced by reflux of interest into the IVC and hepatic veins.  Hiatal hernia. Patient is on surveillance . 05/05/2021, CT abdomen pelvis showed interval increase in the size of solid enhancing lesion arising off the posterior cortex of the interpolar left kidney concerning for renal cell carcinoma.  Multiple bilateral kidney cysts.  Morphologic features of liver suggestive of early cirrhosis.  Cardiac enlargement with evidence of right heart failure small bilateral pleural effusion.  3 cm infrarenal aortic aorta aneurysm  Patient was referred to establish care with oncology for evaluation.  He reports frequent urination and urgency likely secondary to BPH.  Has a history of rheumatic heart disease with a heart murmur.  He remains quite active for his age.  He does yard work and helps house chores.  Denies any cough, unintentional weight loss, fever, night sweats.  Patient's case was reviewed on tumor board.  Consensus recommendation  was active surveillance.  Patient had staging CT scan done of the chest.  06/26/2021 CT chest without contrast was reviewed by me and discussed with patient.  There was no evidence of metastatic disease within the chest.  Other findings including CHF, subpleural interstitial thickening, bilateral nephrolithiasis and pulmonary artery  enlargement were discussed with patient.    INTERVAL HISTORY CAITLIN SYMES is a 89 y.o. male who has above history reviewed by me today presents for follow up visit for management of renal mass  He was accompanied by his wife.   Patient reports night sweats, fever, shortness of breath, cough, abdominal pain. He has lost weight, recently admitted due to acute on chronic CHF    Review of Systems  Constitutional:  Negative for appetite change, chills, fatigue, fever and unexpected weight change.  HENT:   Negative for hearing loss and voice change.   Eyes:  Negative for eye problems and icterus.  Respiratory:  Negative for chest tightness, cough and shortness of breath.   Cardiovascular:  Negative for chest pain and leg swelling.  Gastrointestinal:  Negative for abdominal distention and abdominal pain.  Endocrine: Negative for hot flashes.  Genitourinary:  Positive for frequency and nocturia. Negative for difficulty urinating and dysuria.   Musculoskeletal:  Positive for arthralgias.  Skin:  Negative for itching and rash.  Neurological:  Negative for light-headedness and numbness.  Hematological:  Negative for adenopathy. Does not bruise/bleed easily.  Psychiatric/Behavioral:  Negative for confusion.     MEDICAL HISTORY:  Past Medical History:  Diagnosis Date   A-fib [facility])    a.) CHA2DS2-VASc = 4 (age x 2, CHF, aortic plaque). b.) s/p unsuccessful TEE cardioversion 04/28/2016 --> rec'd 200 J x1 and 360 J x 2. c.) rate/rhythm maintained on oral metoprolol succinante; chronically anticoagulated using dose reduced apixaban.   Anemia    Aortic atherosclerosis (HCC)    Arthritis    CHF (congestive heart failure) (HCC)    a.) TTE 04/26/2016: EF >55%; mod LVH; MVP; mild-mod LAE, mild MR. b.) TTE 05/14/209: EF >55%; mod RVE; mild BAE; mod MR.TR; triv AR/PR. c.) TTE 03/28/2019: EF >55%; mild LVH; mod BAE; mild PR, mod MR/TR.   GERD (gastroesophageal reflux disease)    Heart murmur     History of kidney stones    HOH (hard of hearing)    a.) hearing aid to LEFT ear.   Hyperlipidemia    Hypertension    Infrarenal abdominal aortic aneurysm (AAA) without rupture (HCC) 05/05/2021   a.) CT 05/05/2021 --> measured 3.0 cm.   MVP (mitral valve prolapse)    Peripheral edema    Pneumonia    Pulmonary embolus (HCC)    Pulmonary HTN (HCC) 04/26/2016   a.) TTE 04/26/2016: EF >55%; PASP 45. b.) TTE 12/06/2017: EF >55%; RVSP 76.6 mmHg. c.) TTE 03/28/2019: EF >55%; RVSP 77.8 mmHg.   Renal cell carcinoma (HCC)    Rheumatic fever    Sepsis (HCC)    Valvular heart disease     SURGICAL HISTORY: Past Surgical History:  Procedure Laterality Date   INGUINAL HERNIA REPAIR Left 01/13/2022   Procedure: HERNIA REPAIR INGUINAL ADULT;  Surgeon: Earline Mayotte, MD;  Location: [facility] ORS;  Service: General;  Laterality: Left;   INSERTION OF MESH  01/13/2022   Procedure: INSERTION OF MESH;  Surgeon: Earline Mayotte, MD;  Location: [facility] ORS;  Service: General;;   LITHOTRIPSY     TEE WITH CARDIOVERSION N/A    P   TONSILLECTOMY  SOCIAL HISTORY: Social History   Socioeconomic History   Marital status: Married    Spouse name: Not on file   Number of children: Not on file   Years of education: Not on file   Highest education level: Not on file  Occupational History   Not on file  Tobacco Use   Smoking status: Former    Packs/day: 0.25    Years: 8.00    Additional pack years: 0.00    Total pack years: 2.00    Types: Cigarettes    Quit date: 106    Years since quitting: 84.4   Smokeless tobacco: Never  Vaping Use   Vaping Use: Never used  Substance and Sexual Activity   Alcohol use: No   Drug use: Never   Sexual activity: Not on file  Other Topics Concern   Not on file  Social History Narrative   Not on file   Social Determinants of Health   Financial Resource Strain: Not on file  Food Insecurity: No Food Insecurity (12/01/2022)   Hunger Vital Sign    Worried  About Running Out of Food in the Last Year: Never true    Ran Out of Food in the Last Year: Never true  Transportation Needs: No Transportation Needs (12/01/2022)   PRAPARE - Administrator, Civil Service (Medical): No    Lack of Transportation (Non-Medical): No  Physical Activity: Not on file  Stress: Not on file  Social Connections: Not on file  Intimate Partner Violence: Not At Risk (12/01/2022)   Humiliation, Afraid, Rape, and Kick questionnaire    Fear of Current or Ex-Partner: No    Emotionally Abused: No    Physically Abused: No    Sexually Abused: No    FAMILY HISTORY: Family History  Problem Relation Age of Onset   Heart disease Sister    Hypertension Other     ALLERGIES:  is allergic to penicillins and sulfa antibiotics.  MEDICATIONS:  Current Outpatient Medications  Medication Sig Dispense Refill   apixaban (ELIQUIS) 5 MG TABS tablet Take 5 mg by mouth 2 (two) times daily.     Azelastine HCl 137 MCG/SPRAY SOLN Place 1 spray into the nose 2 (two) times daily as needed.     cetirizine (ZYRTEC) 10 MG tablet SMARTSIG:1 pill By Mouth Daily PRN     empagliflozin (JARDIANCE) 10 MG TABS tablet Take 1 tablet (10 mg total) by mouth daily. 30 tablet 5   fluorouracil (EFUDEX) 5 % cream Apply 1 Application topically 2 (two) times daily.     midodrine (PROAMATINE) 5 MG tablet Take 5 mg by mouth as needed. For SBP <100     rOPINIRole (REQUIP) 1 MG tablet Take 1 mg by mouth at bedtime.     torsemide (DEMADEX) 20 MG tablet 40mg  in the morning and 20mg  in the evening 90 tablet 3   traZODone (DESYREL) 50 MG tablet Take 50 mg by mouth at bedtime.     vitamin B-12 (CYANOCOBALAMIN) 500 MCG tablet Take 1 tablet (500 mcg total) by mouth daily. 90 tablet 3   No current facility-administered medications for this visit.     PHYSICAL EXAMINATION: ECOG PERFORMANCE STATUS: 1 - Symptomatic but completely ambulatory Vitals:   01/03/23 1045  BP: (!) 86/53  Pulse: 81  Resp: 18   Temp: (!) 97.3 F (36.3 C)   Filed Weights   01/03/23 1045  Weight: 112 lb 4.8 oz (50.9 kg)    Physical Exam Constitutional:  General: He is not in acute distress.    Comments: Patient walks with a walker.  HENT:     Head: Normocephalic and atraumatic.  Eyes:     General: No scleral icterus. Cardiovascular:     Rate and Rhythm: Normal rate.     Heart sounds: Murmur heard.  Pulmonary:     Effort: Pulmonary effort is normal. No respiratory distress.  Abdominal:     General: Bowel sounds are normal. There is no distension.     Palpations: Abdomen is soft.  Musculoskeletal:        General: No deformity. Normal range of motion.     Cervical back: Normal range of motion and neck supple.  Skin:    General: Skin is warm and dry.     Findings: No erythema or rash.  Neurological:     Mental Status: He is alert and oriented to person, place, and time. Mental status is at baseline.     Cranial Nerves: No cranial nerve deficit.  Psychiatric:        Mood and Affect: Mood normal.     LABORATORY DATA:  I have reviewed the data as listed    Latest Ref Rng & Units 01/03/2023   10:08 AM 12/07/2022    4:46 AM 12/04/2022    6:03 AM  CBC  WBC 4.0 - 10.5 K/uL 5.3  6.6  6.0   Hemoglobin 13.0 - 17.0 g/dL 16.1  09.6  04.5   Hematocrit 39.0 - 52.0 % 40.5  37.0  34.8   Platelets 150 - 400 K/uL 132  81  79       Latest Ref Rng & Units 01/03/2023   10:08 AM 12/07/2022    4:46 AM 12/06/2022    5:27 AM  CMP  Glucose 70 - 99 mg/dL 84  96  79   BUN 8 - 23 mg/dL 31  31  32   Creatinine 0.61 - 1.24 mg/dL 4.09  8.11  9.14   Sodium 135 - 145 mmol/L 137  135  136   Potassium 3.5 - 5.1 mmol/L 4.1  3.6  3.8   Chloride 98 - 111 mmol/L 92  96  93   CO2 22 - 32 mmol/L 35  32  31   Calcium 8.9 - 10.3 mg/dL 9.0  8.6  8.5   Total Protein 6.5 - 8.1 g/dL 7.7     Total Bilirubin 0.3 - 1.2 mg/dL 1.6     Alkaline Phos 38 - 126 U/L 109     AST 15 - 41 U/L 29     ALT 0 - 44 U/L 16         Iron/TIBC/Ferritin/ %Sat    Component Value Date/Time   IRON 81 09/09/2022 1554   TIBC 356 09/09/2022 1554   IRONPCTSAT 23 09/09/2022 1554      RADIOGRAPHIC STUDIES: I have personally reviewed the radiological images as listed and agreed with the findings in the report. DG Chest Port 1 View  Result Date: 12/05/2022 CLINICAL DATA:  Cough. Technologist notes state recent choking. EXAM: PORTABLE CHEST 1 VIEW COMPARISON:  Chest radiograph 11/30/2022 FINDINGS: Stable cardiomegaly. Unchanged mediastinal contours. Small pleural effusions are similar or slightly improved. Increasing patchy opacity in the left mid lower lung zone. No pneumothorax. Stable osseous structures. IMPRESSION: 1. Increasing patchy opacity in the left mid and lower lung zone, may be atelectasis, aspiration, or pneumonia. 2. Stable cardiomegaly and small pleural effusions. Electronically Signed   By: Narda Rutherford  M.D.   On: 12/05/2022 17:28

## 2023-01-03 NOTE — Assessment & Plan Note (Signed)
Macrocytosis, stable hemoglobin.  Normal vitamin B12 and folate level. Increased LDH likely due to increased RBC turnover secondary to destruction from heart valve disease or presumed kidney cancer.  Given patient's advanced age, other comorbidities, I recommend no further workup. Continue observation.

## 2023-01-03 NOTE — Assessment & Plan Note (Addendum)
Left kidney lesion, slowly increasing size.  1.9 x 1.7 cm. With patient's advanced age, other comorbidities, he is not interested in surgery or biopsy. 12/30/2022, CT scan results pending at time of dictation. I recommend to continue surveillance if patient size is stable or only slightly increased.  Patient and wife agree with the plan.  01/31/2023, addendum CT results reviewed and discussed with patient.  Slightly increased kidney lesion size at 2 cm x 2 cm.  Necrotic appearing aortocaval lymph node, for metastasis.  CT imaging results were reviewed with patient and wife over the phone.  Patient and his wife are not interested in any surgery or biopsy.  They declined urology referral.  I also discussed about a sooner CT in 6 months for follow-up.  Patient and wife prefer to keep neck CT at 1 year.

## 2023-01-05 ENCOUNTER — Telehealth: Payer: Self-pay | Admitting: *Deleted

## 2023-01-05 NOTE — Telephone Encounter (Signed)
Called report  IMPRESSION: 1. Markedly hyperdense heterogeneous exophytic lesion off the left kidney, highly worrisome for renal cell carcinoma. Enlarged necrotic appearing aortocaval lymph node, possibly metastatic. These results will be called to the ordering clinician or representative by the Radiologist Assistant, and communication documented in the PACS or [employer]. 2. Bilateral renal stones. 3. Trace bilateral pleural effusions. 4. 3.0 cm infrarenal aortic aneurysm. Recommend follow-up every 3 years. This recommendation follows ACR consensus guidelines: White Paper of the ACR Incidental Findings Committee II on Vascular Findings. J Am Coll Radiol 2013; 10:789-794. 5. Aortic atherosclerosis (ICD10-I70.0). Coronary artery calcification.     Electronically Signed   By: Leanna Battles M.D.   On: 01/05/2023 11:43

## 2023-01-26 ENCOUNTER — Ambulatory Visit: Payer: Medicare Other | Attending: Family | Admitting: Family

## 2023-01-26 ENCOUNTER — Encounter: Payer: Self-pay | Admitting: Family

## 2023-01-26 VITALS — BP 99/59 | HR 75 | Ht 65.0 in | Wt 120.0 lb

## 2023-01-26 DIAGNOSIS — I272 Pulmonary hypertension, unspecified: Secondary | ICD-10-CM | POA: Insufficient documentation

## 2023-01-26 DIAGNOSIS — Z8249 Family history of ischemic heart disease and other diseases of the circulatory system: Secondary | ICD-10-CM | POA: Insufficient documentation

## 2023-01-26 DIAGNOSIS — J90 Pleural effusion, not elsewhere classified: Secondary | ICD-10-CM | POA: Insufficient documentation

## 2023-01-26 DIAGNOSIS — I5032 Chronic diastolic (congestive) heart failure: Secondary | ICD-10-CM | POA: Diagnosis not present

## 2023-01-26 DIAGNOSIS — R0602 Shortness of breath: Secondary | ICD-10-CM | POA: Insufficient documentation

## 2023-01-26 DIAGNOSIS — I509 Heart failure, unspecified: Secondary | ICD-10-CM | POA: Insufficient documentation

## 2023-01-26 DIAGNOSIS — I4891 Unspecified atrial fibrillation: Secondary | ICD-10-CM | POA: Diagnosis not present

## 2023-01-26 DIAGNOSIS — Z86711 Personal history of pulmonary embolism: Secondary | ICD-10-CM | POA: Insufficient documentation

## 2023-01-26 DIAGNOSIS — I081 Rheumatic disorders of both mitral and tricuspid valves: Secondary | ICD-10-CM | POA: Insufficient documentation

## 2023-01-26 DIAGNOSIS — N189 Chronic kidney disease, unspecified: Secondary | ICD-10-CM | POA: Insufficient documentation

## 2023-01-26 DIAGNOSIS — E785 Hyperlipidemia, unspecified: Secondary | ICD-10-CM | POA: Diagnosis not present

## 2023-01-26 DIAGNOSIS — Z7901 Long term (current) use of anticoagulants: Secondary | ICD-10-CM | POA: Insufficient documentation

## 2023-01-26 DIAGNOSIS — R609 Edema, unspecified: Secondary | ICD-10-CM | POA: Diagnosis not present

## 2023-01-26 DIAGNOSIS — Z79899 Other long term (current) drug therapy: Secondary | ICD-10-CM | POA: Diagnosis not present

## 2023-01-26 DIAGNOSIS — R5383 Other fatigue: Secondary | ICD-10-CM | POA: Diagnosis not present

## 2023-01-26 DIAGNOSIS — I10 Essential (primary) hypertension: Secondary | ICD-10-CM | POA: Diagnosis not present

## 2023-01-26 DIAGNOSIS — I110 Hypertensive heart disease with heart failure: Secondary | ICD-10-CM | POA: Diagnosis present

## 2023-01-26 DIAGNOSIS — Z87891 Personal history of nicotine dependence: Secondary | ICD-10-CM | POA: Diagnosis not present

## 2023-01-26 DIAGNOSIS — Z85528 Personal history of other malignant neoplasm of kidney: Secondary | ICD-10-CM | POA: Diagnosis not present

## 2023-01-26 DIAGNOSIS — R42 Dizziness and giddiness: Secondary | ICD-10-CM | POA: Diagnosis not present

## 2023-01-26 DIAGNOSIS — K219 Gastro-esophageal reflux disease without esophagitis: Secondary | ICD-10-CM | POA: Insufficient documentation

## 2023-01-26 DIAGNOSIS — I4811 Longstanding persistent atrial fibrillation: Secondary | ICD-10-CM

## 2023-01-26 DIAGNOSIS — Z8679 Personal history of other diseases of the circulatory system: Secondary | ICD-10-CM | POA: Diagnosis not present

## 2023-01-26 DIAGNOSIS — I130 Hypertensive heart and chronic kidney disease with heart failure and stage 1 through stage 4 chronic kidney disease, or unspecified chronic kidney disease: Secondary | ICD-10-CM | POA: Diagnosis not present

## 2023-01-26 DIAGNOSIS — I428 Other cardiomyopathies: Secondary | ICD-10-CM | POA: Diagnosis not present

## 2023-01-26 MED ORDER — MIDODRINE HCL 5 MG PO TABS: 5.0000 mg | ORAL_TABLET | Freq: Two times a day (BID) | ORAL | 5 refills | Status: AC

## 2023-01-26 NOTE — Progress Notes (Signed)
PCP: Aram Beecham, MD (last seen 06/24) Primary Cardiologist: Marcina Millard, MD (last seen 05/24)  HPI:  Marcus Kemp is a 89 y/o male with a history of AAA, hyperlipidemia, mitral valve prolapse, HTN, CKD, atrial fibrillation, anemia, GERD, PE, renal cell carcinoma, rheumatic heart disease, pulmonary HTN and chronic heart failure. Had unsuccessful cardioversion 04/29/16 @ [facility].    Echo 09/10/22: EF of 55-60% along with moderate LVH along with severely elevated PA pressure of 70.4 mmHg, moderate LAE and moderate/ severe Marcus/TR.   Admitted 11/30/22 due to a/c heart failure exacerbation. Chest x-ray showed features of pulmonary edema, minimal bilateral pleural effusion. IV diuresed transitioned to oral diuretics. Was in the ED 10/31/22 due to pedal edema. IV lasix given. Admitted 09/09/22 due to pedal edema due to HF exacerbation. Diuresed. D-dimer elevated and CTA + for PE with right heart strain RV:LV 1.8, cardiac enlargement with bilateral pleural effusions and probable interstitial edema. Heparin gtt and then placed on eliquis. Midodrine started.   He presents today for a HF f/u visit with a chief complaint of moderate fatigue with minimal exertion. Chronic in nature. Has associated pedal edema, shortness of breath and dizziness with sudden position changes along with this. Denies cough, chest pain, palpitations, abdominal distention, weight gain or difficulty sleeping.    At last visit, metoprolol was stopped. Take midodrine any time his SBP is < 100 and his wife says that this is usually twice a day.   ROS: All systems negative except as listed in HPI, PMH and Problem List.  SH:  Social History   Socioeconomic History   Marital status: Married    Spouse name: Not on file   Number of children: Not on file   Years of education: Not on file   Highest education level: Not on file  Occupational History   Not on file  Tobacco Use   Smoking status: Former    Packs/day: 0.25    Years: 8.00     Additional pack years: 0.00    Total pack years: 2.00    Types: Cigarettes    Quit date: 5    Years since quitting: 84.5   Smokeless tobacco: Never  Vaping Use   Vaping Use: Never used  Substance and Sexual Activity   Alcohol use: No   Drug use: Never   Sexual activity: Not on file  Other Topics Concern   Not on file  Social History Narrative   Not on file   Social Determinants of Health   Financial Resource Strain: Not on file  Food Insecurity: No Food Insecurity (12/01/2022)   Hunger Vital Sign    Worried About Running Out of Food in the Last Year: Never true    Ran Out of Food in the Last Year: Never true  Transportation Needs: No Transportation Needs (12/01/2022)   PRAPARE - Administrator, Civil Service (Medical): No    Lack of Transportation (Non-Medical): No  Physical Activity: Not on file  Stress: Not on file  Social Connections: Not on file  Intimate Partner Violence: Not At Risk (12/01/2022)   Humiliation, Afraid, Rape, and Kick questionnaire    Fear of Current or Ex-Partner: No    Emotionally Abused: No    Physically Abused: No    Sexually Abused: No    FH:  Family History  Problem Relation Age of Onset   Heart disease Sister    Hypertension Other     Past Medical History:  Diagnosis Date   A-fib (  HCC)    a.) CHA2DS2-VASc = 4 (age x 2, CHF, aortic plaque). b.) s/p unsuccessful TEE cardioversion 04/28/2016 --> rec'd 200 J x1 and 360 J x 2. c.) rate/rhythm maintained on oral metoprolol succinante; chronically anticoagulated using dose reduced apixaban.   Anemia    Aortic atherosclerosis (HCC)    Arthritis    CHF (congestive heart failure) (HCC)    a.) TTE 04/26/2016: EF >55%; mod LVH; MVP; mild-mod LAE, mild Marcus. b.) TTE 05/14/209: EF >55%; mod RVE; mild BAE; mod Marcus.TR; triv AR/PR. c.) TTE 03/28/2019: EF >55%; mild LVH; mod BAE; mild PR, mod Marcus/TR.   GERD (gastroesophageal reflux disease)    Heart murmur    History of kidney stones    HOH  (hard of hearing)    a.) hearing aid to LEFT ear.   Hyperlipidemia    Hypertension    Infrarenal abdominal aortic aneurysm (AAA) without rupture (HCC) 05/05/2021   a.) CT 05/05/2021 --> measured 3.0 cm.   MVP (mitral valve prolapse)    Peripheral edema    Pneumonia    Pulmonary embolus (HCC)    Pulmonary HTN (HCC) 04/26/2016   a.) TTE 04/26/2016: EF >55%; PASP 45. b.) TTE 12/06/2017: EF >55%; RVSP 76.6 mmHg. c.) TTE 03/28/2019: EF >55%; RVSP 77.8 mmHg.   Renal cell carcinoma (HCC)    Rheumatic fever    Sepsis (HCC)    Valvular heart disease     Current Outpatient Medications  Medication Sig Dispense Refill   apixaban (ELIQUIS) 5 MG TABS tablet Take 5 mg by mouth 2 (two) times daily.     Azelastine HCl 137 MCG/SPRAY SOLN Place 1 spray into the nose 2 (two) times daily as needed.     cetirizine (ZYRTEC) 10 MG tablet SMARTSIG:1 pill By Mouth Daily PRN     empagliflozin (JARDIANCE) 10 MG TABS tablet Take 1 tablet (10 mg total) by mouth daily. 30 tablet 5   fluorouracil (EFUDEX) 5 % cream Apply 1 Application topically 2 (two) times daily.     midodrine (PROAMATINE) 5 MG tablet Take 5 mg by mouth as needed. For SBP <100     rOPINIRole (REQUIP) 1 MG tablet Take 1 mg by mouth at bedtime.     torsemide (DEMADEX) 20 MG tablet 40mg  in the morning and 20mg  in the evening 90 tablet 3   traZODone (DESYREL) 50 MG tablet Take 50 mg by mouth at bedtime.     vitamin B-12 (CYANOCOBALAMIN) 500 MCG tablet Take 1 tablet (500 mcg total) by mouth daily. 90 tablet 3   No current facility-administered medications for this visit.   Vitals:   01/26/23 0944  BP: (!) 99/59  Pulse: 75  SpO2: 98%  Weight: 120 lb (54.4 kg)  Height: 5\' 5"  (1.651 m)   Wt Readings from Last 3 Encounters:  01/26/23 120 lb (54.4 kg)  01/03/23 112 lb 4.8 oz (50.9 kg)  12/22/22 120 lb 6.4 oz (54.6 kg)   Lab Results  Component Value Date   CREATININE 1.22 01/03/2023   CREATININE 1.20 12/07/2022   CREATININE 1.15 12/06/2022    PHYSICAL EXAM:  General:  Well appearing. No resp difficulty HEENT: normal, HOH Neck: supple. JVP flat.  No lymphadenopathy or thryomegaly appreciated. Cor: PMI normal. Regular rhythm, rate. No rubs, gallops or murmurs. Lungs: clear Abdomen: soft, nontender, nondistended. No hepatosplenomegaly. No bruits or masses.  Extremities: no cyanosis, clubbing, rash, trace pitting edema left lower leg Neuro: alert & oriented x3, cranial nerves grossly intact. Moves all  4 extremities w/o difficulty. Affect pleasant.   ECG: not done   ASSESSMENT & PLAN:  1: NICM with preserved ejection fraction- - likely due to a fib - NYHA class III - euvolemic today - weighing daily; reminded to call for an overnight weight gain of > 2 pounds or a weekly weight gain of > 5 pounds - weight stable from last visit here 1 month ago - echo 09/10/22: EF of 55-60% along with moderate LV along with severely elevated PA pressure of 70.4 mmHg, moderate LAE and moderate/ severe Marcus/TR.  - not adding salt to his food & is using NoSalt for seasoning - continue jardiance 10mg  daily - continue torsemide 20mg  BID - BNP 11/30/22 was 1813.4  2: HTN- - BP 99/59 - midodrine 5mg  TID PRN for SBP <100; wife says that it's usually twice daily that he has to take it - saw PCP ([facility]) 06/24 - BMP 01/03/23 showed sodium 137, potassium 4.1, creatinine 1.22 & GFR 55  3: Atrial fibrillation-  - saw cardiology ([facility]) 05/24 - continue eliquis 5mg  BID - unsuccessful cardioversion done 04/2016   Due to HF stability, will not make a return appointment at this time. Advised patient and his wife that they could call back at anytime to schedule another appointment and they were comfortable with this plan.

## 2023-01-26 NOTE — Patient Instructions (Signed)
It was good to see you today!   Call us in the future if you need [place] for anything.

## 2023-01-30 ENCOUNTER — Encounter: Payer: Self-pay | Admitting: Family

## 2023-01-31 ENCOUNTER — Other Ambulatory Visit (HOSPITAL_COMMUNITY): Payer: Self-pay

## 2023-01-31 ENCOUNTER — Telehealth (HOSPITAL_COMMUNITY): Payer: Self-pay

## 2023-01-31 NOTE — Telephone Encounter (Signed)
Advanced Heart Failure Patient Advocate Encounter  The patient was approved for a [employer] grant that will help cover the cost of Jardiance.  Total amount awarded, $10,000.  Effective: 01/01/2023 - 12/31/2023.  BIN [id number] PCN [id number] Group [id number] ID [id number]  Pharmacy provided with approval and processing information. [facility] confirmed $0 copay on Jardiance. Patient wife informed via phone.  Burnell Blanks, CPhT Rx Patient Advocate Phone: [phone]

## 2023-01-31 NOTE — Telephone Encounter (Signed)
Stephaine, could you check on the cost of this medication for this patient? Thanks so much.

## 2023-02-12 ENCOUNTER — Other Ambulatory Visit: Payer: Self-pay | Admitting: Family

## 2023-04-22 ENCOUNTER — Other Ambulatory Visit: Payer: Self-pay | Admitting: Family

## 2023-08-03 NOTE — Progress Notes (Signed)
 Marcus Kemp is a  89 y.o. male who presents for  CHIEF COMPLAINT Chief Complaint  Patient presents with  . Follow-up  . Atrial Fibrillation  . Congestive Heart Failure  . Hyperlipidemia    Subjective: History of Present Illness  Pt in NAD. BP stable on Midodrine . Weight stable. Has CHF on Demadex  and A-fib on Eliquis . Also with HLD not on statin due to age.  Feels OK. Sleeping is hit or miss. Eating OK. Denies CP or SOB. No change in bowels or bladder.   Past Medical History:  Diagnosis Date  . Anemia   . Atrial fibrillation (CMS/HHS-HCC)   . Cardiac murmur   . CHF (congestive heart failure) (CMS/HHS-HCC)   . Hyperlipidemia   . Mitral regurgitation    mild to moderate  . MVP (mitral valve prolapse)   . Nephrolithiasis   . Rheumatic fever    as a child   Patient Active Problem List  Diagnosis  . Hyperlipidemia  . Rheumatic mitral regurgitation  . Anemia  . MVP (mitral valve prolapse)  . Rheumatic fever  . Nephrolithiasis  . Longstanding persistent atrial fibrillation (CMS/HHS-HCC)  . Chronic diastolic CHF (congestive heart failure) (CMS/HHS-HCC)  . Peripheral edema  . Jugular venous distension  . Status post catheter ablation of atrial flutter  . Rheumatic tricuspid valve regurgitation  . Hospital discharge follow-up    Past Surgical History:  Procedure Laterality Date  . catheter ablation  04/2016  . INGUINAL HERNIA REPAIR Left 01/13/2022   Bard Perfix Plug and patch (medium)  . LITHOTRIPSY    . TONSILLECTOMY       Current Outpatient Medications:  .  apixaban  (ELIQUIS ) 2.5 mg tablet, Take 1 tablet (2.5 mg total) by mouth every 12 (twelve) hours, Disp: 180 tablet, Rfl: 3 .  azelastine (ASTELIN) 137 mcg nasal spray, Place 2 sprays into both nostrils 2 (two) times daily, Disp: , Rfl:  .  cetirizine (ZYRTEC) 10 MG tablet, TAKE 1 PILL ONCE DAILY AS NEEDED, Disp: , Rfl:  .  cyanocobalamin  (VITAMIN B12) 1000 MCG tablet, Take 1,000 mcg by mouth once daily,  Disp: , Rfl:  .  empagliflozin  (JARDIANCE ) 10 mg tablet, Take 1 tablet (10 mg total) by mouth once daily, Disp: , Rfl:  .  levocetirizine (XYZAL) 5 MG tablet, Take 5 mg by mouth every evening, Disp: , Rfl:  .  midodrine  (PROAMATINE ) 5 MG tablet, Take by mouth, Disp: , Rfl:  .  midodrine  (PROAMATINE ) 5 MG tablet, Take 1 tablet (5 mg total) by mouth once daily as needed FOR LOW BP SYSTOLIC <100, Disp: , Rfl:  .  mirtazapine (REMERON) 7.5 MG tablet, TAKE 1 TABLET BY MOUTH AT BEDTIME., Disp: 90 tablet, Rfl: 2 .  NON FORMULARY, Relaxium, Disp: , Rfl:  .  rOPINIRole  (REQUIP ) 1 MG tablet, take 1 tablet by mouth at bedtime., Disp: 90 tablet, Rfl: 1 .  TORsemide  (DEMADEX ) 20 MG tablet, Take 1 tablet (20 mg total) by mouth 2 (two) times daily, Disp: , Rfl:  .  traZODone  (DESYREL ) 50 MG tablet, TAKE 1 TABLET BY MOUTH EVERYDAY AT BEDTIME, Disp: 90 tablet, Rfl: 4  Penicillin and Sulfa (sulfonamide antibiotics)  Social History   Socioeconomic History  . Marital status: Married  Tobacco Use  . Smoking status: Former    Current packs/day: 0.00    Average packs/day: 0.3 packs/day for 8.0 years (2.0 ttl pk-yrs)    Types: Cigarettes    Start date: 07/26/1930    Quit date: 07/26/1938  Years since quitting: 85.0  . Smokeless tobacco: Never  Vaping Use  . Vaping status: Never Used  Substance and Sexual Activity  . Alcohol use: No   Social Drivers of Health   Food Insecurity: No Food Insecurity (12/01/2022)   Received from [facility]   Hunger Vital Sign   . Worried About Programme Researcher, Broadcasting/film/video in the Last Year: Never true   . Ran Out of Food in the Last Year: Never true  Transportation Needs: No Transportation Needs (12/01/2022)   Received from [facility] - Transportation   . Lack of Transportation (Medical): No   . Lack of Transportation (Non-Medical): No  Housing Stability: Unknown (08/03/2023)   Housing Stability Vital Sign   . Homeless in the Last Year: No    Family History  Problem  Relation Name Age of Onset  . Heart disease Sister      A comprehensive ROS was negative except for HPI  PE: BP 110/68   Pulse 80   Ht 162.6 cm (5' 4)   Wt 64 kg (141 lb)   SpO2 94%   BMI 24.20 kg/m  The patient looks well today. He is in no distress. Neck is supple without adenopathy.  Carotids are 2+ bilaterally without bruits. Lungs are clear. Heart is regular rate and rhythm without murmurs, rubs, or gallops. Abdomen is soft, non tender, no masses or organomegaly. Lower extremities without obvious edema.   Office Visit on 04/27/2023  Component Date Value Ref Range Status  . WBC (White Blood Cell Count) 04/27/2023 7.1  4.1 - 10.2 10^3/uL Final  . RBC (Red Blood Cell Count) 04/27/2023 3.38 (L)  4.69 - 6.13 10^6/uL Final  . Hemoglobin 04/27/2023 11.3 (L)  14.1 - 18.1 gm/dL Final  . Hematocrit 89/97/7975 35.3 (L)  40.0 - 52.0 % Final  . MCV (Mean Corpuscular Volume) 04/27/2023 104.4 (H)  80.0 - 100.0 fl Final  . MCH (Mean Corpuscular Hemoglobin) 04/27/2023 33.4 (H)  27.0 - 31.2 pg Final  . MCHC (Mean Corpuscular Hemoglobin * 04/27/2023 32.0  32.0 - 36.0 gm/dL Final  . Platelet Count 04/27/2023 162  150 - 450 10^3/uL Final  . RDW-CV (Red Cell Distribution Widt* 04/27/2023 14.9 (H)  11.6 - 14.8 % Final  . MPV (Mean Platelet Volume) 04/27/2023 10.5  9.4 - 12.4 fl Final  . Neutrophils 04/27/2023 4.39  1.50 - 7.80 10^3/uL Final  . Lymphocytes 04/27/2023 1.73  1.00 - 3.60 10^3/uL Final  . Monocytes 04/27/2023 0.71  0.00 - 1.50 10^3/uL Final  . Eosinophils 04/27/2023 0.15  0.00 - 0.55 10^3/uL Final  . Basophils 04/27/2023 0.04  0.00 - 0.09 10^3/uL Final  . Neutrophil % 04/27/2023 62.1  32.0 - 70.0 % Final  . Lymphocyte % 04/27/2023 24.5  10.0 - 50.0 % Final  . Monocyte % 04/27/2023 10.1  4.0 - 13.0 % Final  . Eosinophil % 04/27/2023 2.1  1.0 - 5.0 % Final  . Basophil% 04/27/2023 0.6  0.0 - 2.0 % Final  . Immature Granulocyte % 04/27/2023 0.6  <=0.7 % Final  . Immature Granulocyte  Count 04/27/2023 0.04  <=0.06 10^3/L Final  . Glucose 04/27/2023 128 (H)  70 - 110 mg/dL Final  . Sodium 89/97/7975 142  136 - 145 mmol/L Final  . Potassium 04/27/2023 4.1  3.6 - 5.1 mmol/L Final  . Chloride 04/27/2023 99  97 - 109 mmol/L Final  . Carbon Dioxide (CO2) 04/27/2023 35.5 (H)  22.0 - 32.0 mmol/L Final  .  Urea Nitrogen (BUN) 04/27/2023 34 (H)  7 - 25 mg/dL Final  . Creatinine 89/97/7975 1.1  0.7 - 1.3 mg/dL Final  . Glomerular Filtration Rate (eGFR) 04/27/2023 62  >60 mL/min/1.73sq m Final  . Calcium 04/27/2023 9.6  8.7 - 10.3 mg/dL Final  . AST  89/97/7975 21  8 - 39 U/L Final  . ALT  04/27/2023 14  6 - 57 U/L Final  . Alk Phos (alkaline Phosphatase) 04/27/2023 118 (H)  34 - 104 U/L Final  . Albumin 04/27/2023 3.9  3.5 - 4.8 g/dL Final  . Bilirubin, Total 04/27/2023 1.2  0.3 - 1.2 mg/dL Final  . Protein, Total 04/27/2023 6.9  6.1 - 7.9 g/dL Final  . A/G Ratio 89/97/7975 1.3  1.0 - 5.0 gm/dL Final  . Color 89/97/7975 Light Yellow  Colorless, Straw, Light Yellow, Yellow, Dark Yellow Final  . Clarity 04/27/2023 Clear  Clear Final  . Specific Gravity 04/27/2023 1.009  1.005 - 1.030 Final  . pH, Urine 04/27/2023 6.0  5.0 - 8.0 Final  . Protein, Urinalysis 04/27/2023 Negative  Negative mg/dL Final  . Glucose, Urinalysis 04/27/2023 4+ (!)  Negative mg/dL Final  . Ketones, Urinalysis 04/27/2023 Negative  Negative mg/dL Final  . Blood, Urinalysis 04/27/2023 Negative  Negative Final  . Nitrite, Urinalysis 04/27/2023 Negative  Negative Final  . Leukocyte Esterase, Urinalysis 04/27/2023 Negative  Negative Final  . Bilirubin, Urinalysis 04/27/2023 Negative  Negative Final  . Urobilinogen, Urinalysis 04/27/2023 0.2  0.2 - 1.0 mg/dL Final  . WBC, UA 89/97/7975 1  <=5 /hpf Final  . Red Blood Cells, Urinalysis 04/27/2023 1  <=3 /hpf Final  . Bacteria, Urinalysis 04/27/2023 0-5  0 - 5 /hpf Final  . Squamous Epithelial Cells, Urinaly* 04/27/2023 0  /hpf Final  Office Visit on 11/22/2022   Component Date Value Ref Range Status  . Glucose 11/22/2022 79  70 - 110 mg/dL Final  . Sodium 95/70/7975 138  136 - 145 mmol/L Final  . Potassium 11/22/2022 3.9  3.6 - 5.1 mmol/L Final  . Chloride 11/22/2022 97  97 - 109 mmol/L Final  . Carbon Dioxide (CO2) 11/22/2022 33.5 (H)  22.0 - 32.0 mmol/L Final  . Calcium 11/22/2022 8.9  8.7 - 10.3 mg/dL Final  . Urea Nitrogen (BUN) 11/22/2022 37 (H)  7 - 25 mg/dL Final  . Creatinine 95/70/7975 1.6 (H)  0.7 - 1.3 mg/dL Final  . Glomerular Filtration Rate (eGFR) 11/22/2022 39 (L)  >60 mL/min/1.73sq m Final  . BUN/Crea Ratio 11/22/2022 23.1 (H)  6.0 - 20.0 Final  . Anion Gap w/K 11/22/2022 11.4  6.0 - 16.0 Final  Office Visit on 10/13/2022  Component Date Value Ref Range Status  . WBC (White Blood Cell Count) 10/13/2022 6.0  4.1 - 10.2 10^3/uL Final  . RBC (Red Blood Cell Count) 10/13/2022 3.76 (L)  4.69 - 6.13 10^6/uL Final  . Hemoglobin 10/13/2022 12.6 (L)  14.1 - 18.1 gm/dL Final  . Hematocrit 96/79/7975 38.9 (L)  40.0 - 52.0 % Final  . MCV (Mean Corpuscular Volume) 10/13/2022 103.5 (H)  80.0 - 100.0 fl Final  . MCH (Mean Corpuscular Hemoglobin) 10/13/2022 33.5 (H)  27.0 - 31.2 pg Final  . MCHC (Mean Corpuscular Hemoglobin * 10/13/2022 32.4  32.0 - 36.0 gm/dL Final  . Platelet Count 10/13/2022 107 (L)  150 - 450 10^3/uL Final  . RDW-CV (Red Cell Distribution Widt* 10/13/2022 16.4 (H)  11.6 - 14.8 % Final  . MPV (Mean Platelet Volume) 10/13/2022 12.1  9.4 -  12.4 fl Final  . Neutrophils 10/13/2022 3.23  1.50 - 7.80 10^3/uL Final  . Lymphocytes 10/13/2022 1.93  1.00 - 3.60 10^3/uL Final  . Monocytes 10/13/2022 0.68  0.00 - 1.50 10^3/uL Final  . Eosinophils 10/13/2022 0.10  0.00 - 0.55 10^3/uL Final  . Basophils 10/13/2022 0.06  0.00 - 0.09 10^3/uL Final  . Neutrophil % 10/13/2022 53.7  32.0 - 70.0 % Final  . Lymphocyte % 10/13/2022 32.1  10.0 - 50.0 % Final  . Monocyte % 10/13/2022 11.3  4.0 - 13.0 % Final  . Eosinophil % 10/13/2022 1.7  1.0  - 5.0 % Final  . Basophil% 10/13/2022 1.0  0.0 - 2.0 % Final  . Immature Granulocyte % 10/13/2022 0.2  <=0.7 % Final  . Immature Granulocyte Count 10/13/2022 0.01  <=0.06 10^3/L Final  . Glucose 10/13/2022 58 (L)  70 - 110 mg/dL Final  . Sodium 96/79/7975 137  136 - 145 mmol/L Final  . Potassium 10/13/2022 5.2 (H)  3.6 - 5.1 mmol/L Final  . Chloride 10/13/2022 101  97 - 109 mmol/L Final  . Carbon Dioxide (CO2) 10/13/2022 27.1  22.0 - 32.0 mmol/L Final  . Urea Nitrogen (BUN) 10/13/2022 44 (H)  7 - 25 mg/dL Final  . Creatinine 96/79/7975 1.7 (H)  0.7 - 1.3 mg/dL Final  . Glomerular Filtration Rate (eGFR) 10/13/2022 37 (L)  >60 mL/min/1.73sq m Final  . Calcium 10/13/2022 9.3  8.7 - 10.3 mg/dL Final  . AST  96/79/7975 26  8 - 39 U/L Final  . ALT  10/13/2022 14  6 - 57 U/L Final  . Alk Phos (alkaline Phosphatase) 10/13/2022 130 (H)  34 - 104 U/L Final  . Albumin 10/13/2022 3.6  3.5 - 4.8 g/dL Final  . Bilirubin, Total 10/13/2022 1.9 (H)  0.3 - 1.2 mg/dL Final  . Protein, Total 10/13/2022 6.9  6.1 - 7.9 g/dL Final  . A/G Ratio 96/79/7975 1.1  1.0 - 5.0 gm/dL Final  . Color 96/79/7975 Light Yellow  Colorless, Straw, Light Yellow, Yellow, Dark Yellow Final  . Clarity 10/13/2022 Clear  Clear Final  . Specific Gravity 10/13/2022 1.011  1.005 - 1.030 Final  . pH, Urine 10/13/2022 6.0  5.0 - 8.0 Final  . Protein, Urinalysis 10/13/2022 Trace (!)  Negative mg/dL Final  . Glucose, Urinalysis 10/13/2022 Negative  Negative mg/dL Final  . Ketones, Urinalysis 10/13/2022 Negative  Negative mg/dL Final  . Blood, Urinalysis 10/13/2022 Negative  Negative Final  . Nitrite, Urinalysis 10/13/2022 Negative  Negative Final  . Leukocyte Esterase, Urinalysis 10/13/2022 Negative  Negative Final  . Hyaline Casts, Urinalysis 10/13/2022 3  /lpf Final  . Bilirubin, Urinalysis 10/13/2022 Negative  Negative Final  . Urobilinogen, Urinalysis 10/13/2022 0.2  0.2 - 1.0 mg/dL Final  . WBC, UA 96/79/7975 1  <=5 /hpf Final   . Red Blood Cells, Urinalysis 10/13/2022 1  <=3 /hpf Final  . Bacteria, Urinalysis 10/13/2022 0-5  0 - 5 /hpf Final  . Squamous Epithelial Cells, Urinaly* 10/13/2022 0  /hpf Final   DIAGNOSIS: Longstanding persistent atrial fibrillation (CMS/HHS-HCC)  (primary encounter diagnosis)  Chronic diastolic CHF (congestive heart failure) (CMS/HHS-HCC)  Pure hypercholesterolemia   PLAN: No cahnge in care. Labs today. F/u with Cardiology. RTC 3 mo, sooner if needed     Attestation Statement:   I personally performed the service. (TP)  Reyes [name] Costa, MD, MD

## 2023-08-17 ENCOUNTER — Other Ambulatory Visit: Payer: Self-pay | Admitting: Family

## 2023-10-21 ENCOUNTER — Other Ambulatory Visit: Payer: Self-pay | Admitting: Family

## 2023-11-15 ENCOUNTER — Other Ambulatory Visit: Payer: Self-pay | Admitting: Family

## 2023-11-18 IMAGING — CT CT CHEST W/O CM
2 of 4 series · 15 of 36 positions shown, 18 images · non-contrast
Comparison: 12/18/2020 chest radiograph, report only.

CLINICAL DATA: Renal cell carcinoma diagnosed in [REDACTED].  Staging.

EXAM:
CT CHEST WITHOUT CONTRAST
TECHNIQUE: Multidetector CT imaging of the chest was performed following the
standard protocol without IV contrast.

[Series 2: chest 2.00 · axial · 0.64mm/px · z∈[-1193,-917]mm · 12 of 164 slices shown, 15 images]
[im 13/164  mediastinal]
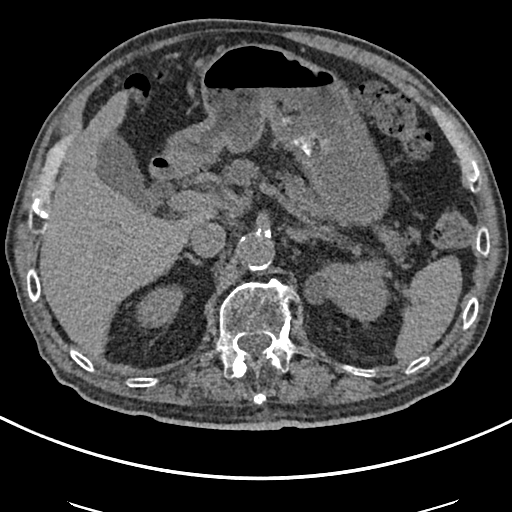
[im 13/164  lung]
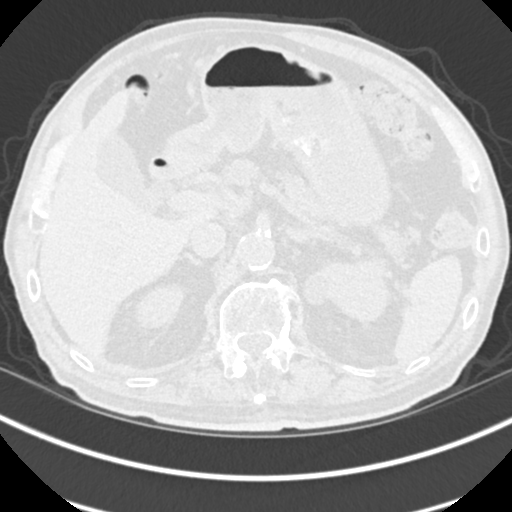
[im 26/164  lung]
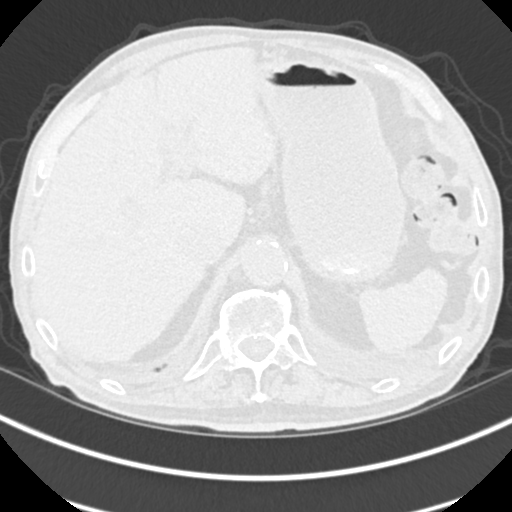
[im 38/164  lung]
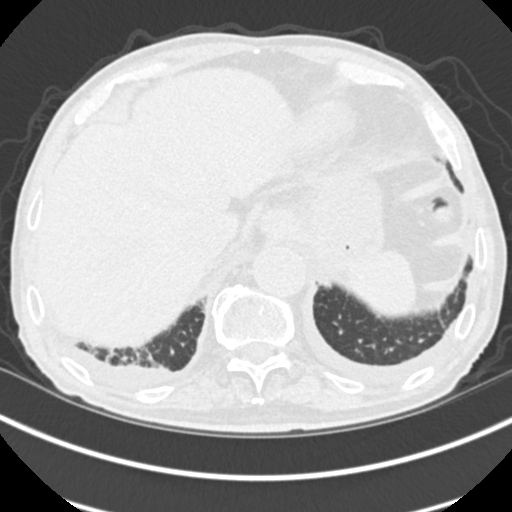
[im 51/164  lung]
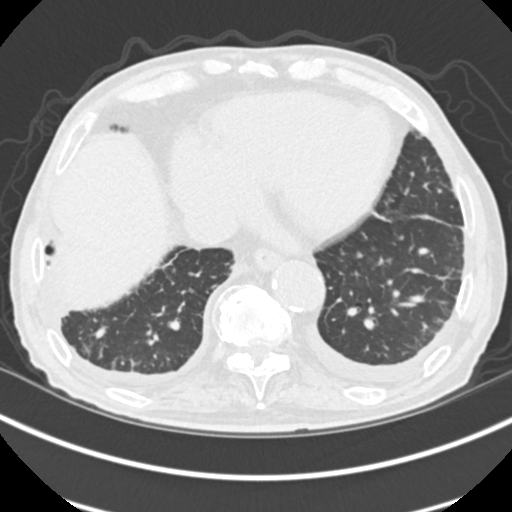
[im 63/164  mediastinal]
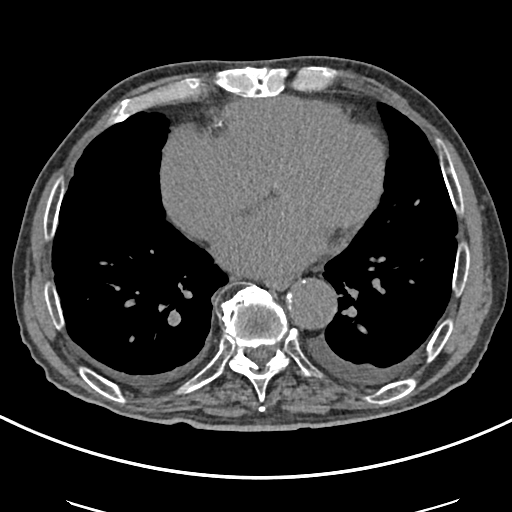
[im 63/164  lung]
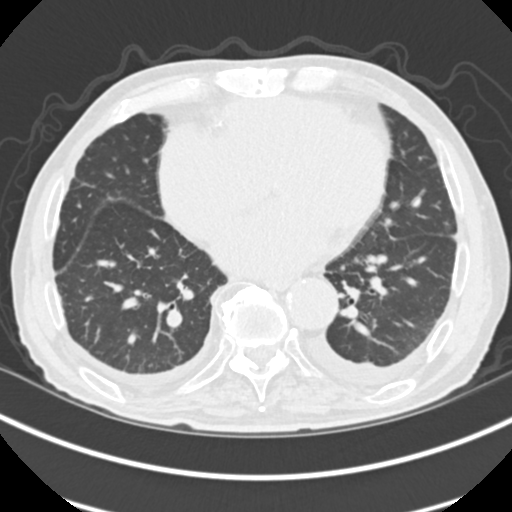
[im 76/164  lung]
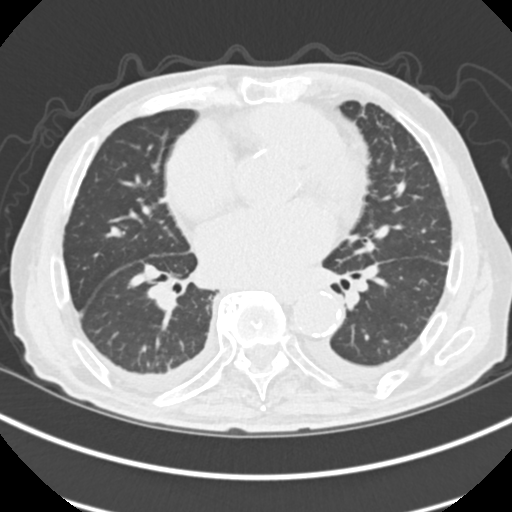
[im 88/164  lung]
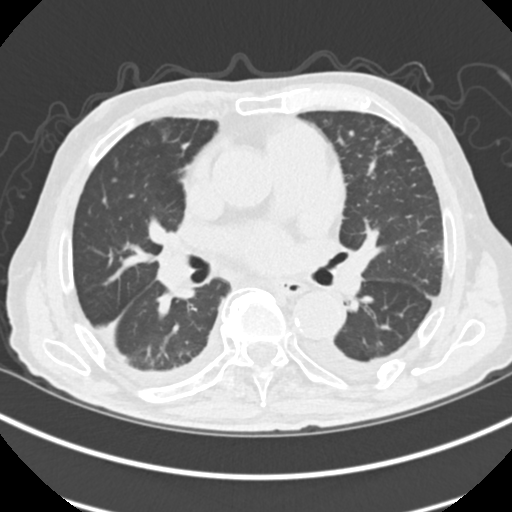
[im 101/164  lung]
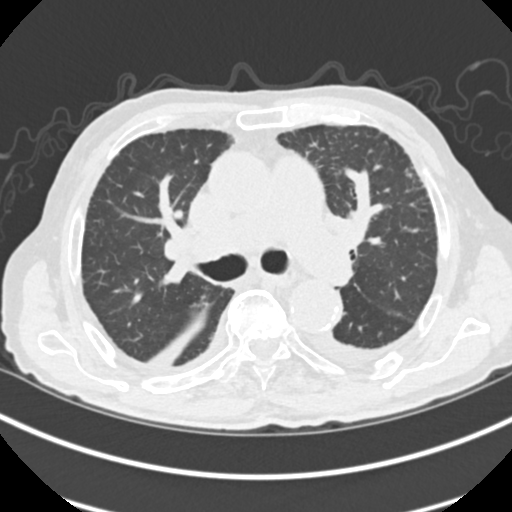
[im 113/164  mediastinal]
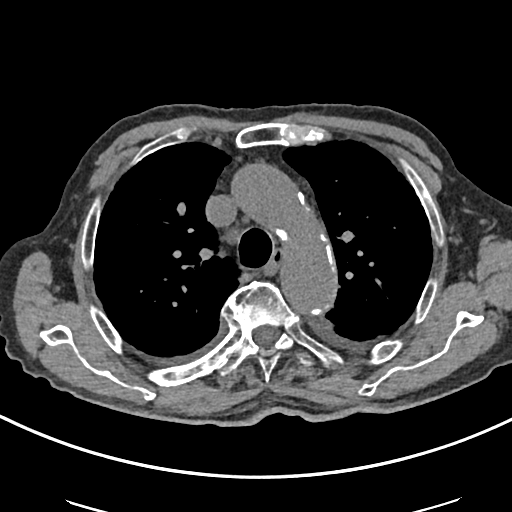
[im 113/164  lung]
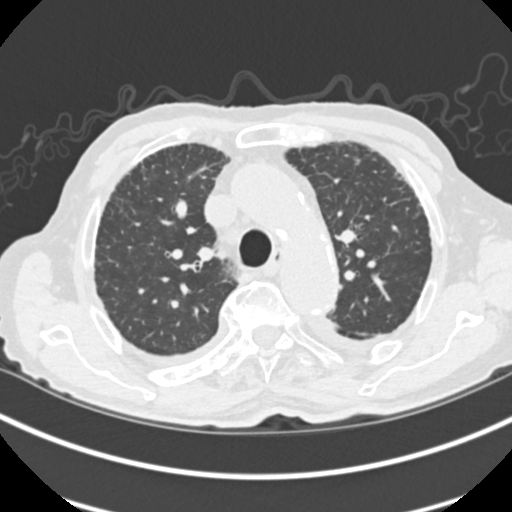
[im 126/164  lung]
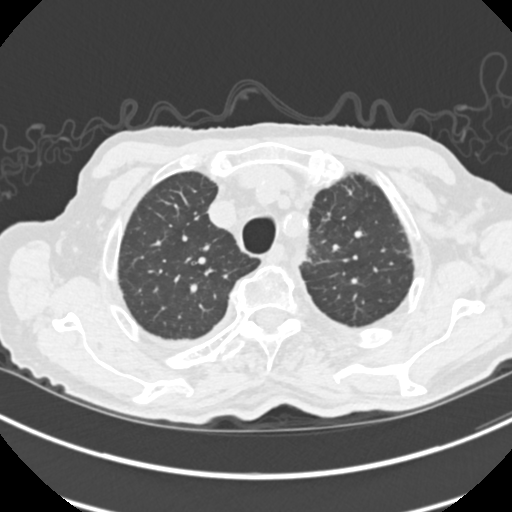
[im 138/164  lung]
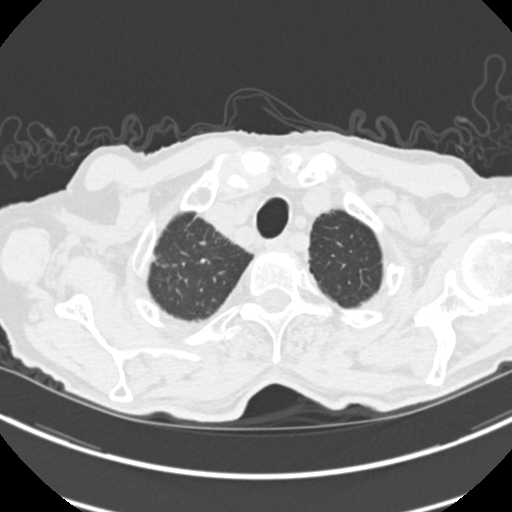
[im 151/164  lung]
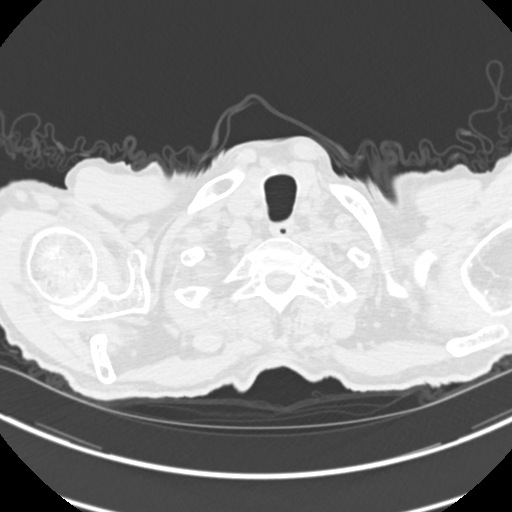

[Series 5: coronals chest 2.00 cor · coronal · 0.64mm/px · 3 of 133 slices shown]
[im 27/133  lung]
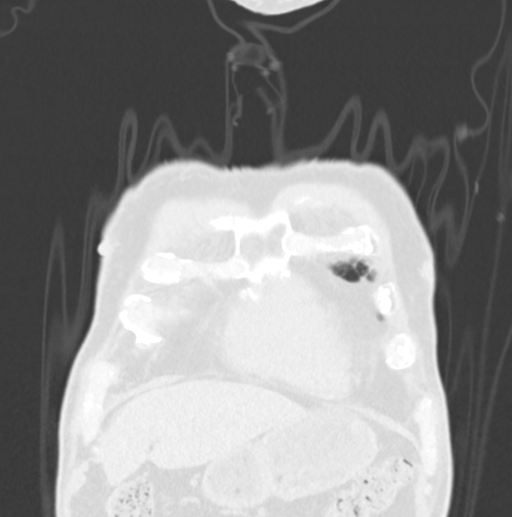
[im 53/133  lung]
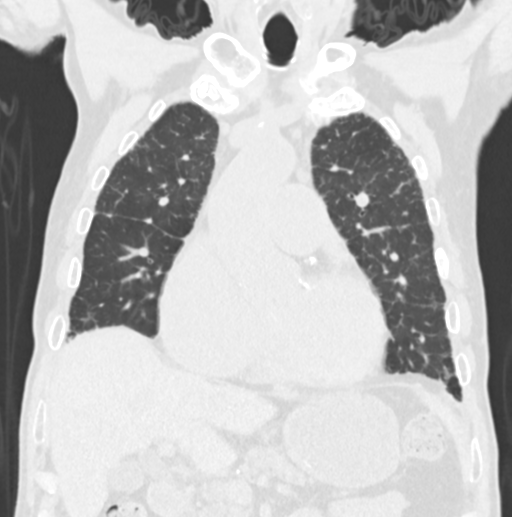
[im 80/133  lung]
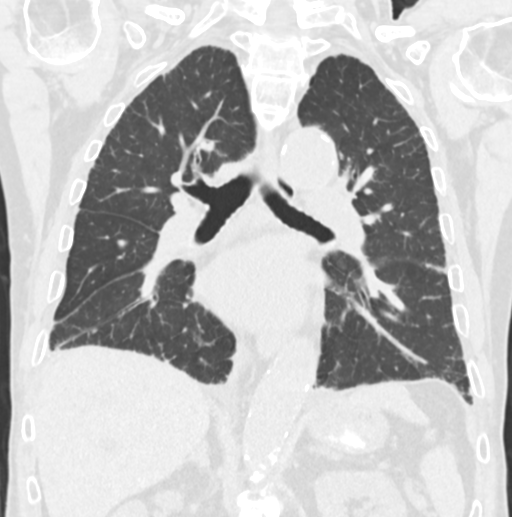

[15 of 36 positions shown; findings below may reference images not displayed]

FINDINGS: Cardiovascular: Aortic atherosclerosis. Tortuous thoracic aorta.
Moderate cardiomegaly with biatrial enlargement.

Pulmonary artery enlargement, outflow tract 3.8 cm. Left main, LAD,
right coronary artery calcification.

Mediastinum/Nodes: Small left jugular/low cervical nodes are not
pathologic by size criteria at up to 7 mm. No mediastinal or
definite hilar adenopathy, given limitations of unenhanced CT.

Lungs/Pleura: Small bilateral pleural effusions.

Bibasilar mild septal thickening. Concurrent mild subpleural
interstitial thickening without craniocaudal gradient.

No suspicious pulmonary nodule or mass.

Upper Abdomen: Subcentimeter right hepatic lobe low-density lesion
is likely a cyst. Caudate lobe enlargement again identified. Normal
imaged portions of the spleen, stomach, pancreas, gallbladder,
adrenal glands. Bilateral renal collecting system calculi, including
at up to 4 mm in the interpolar right kidney.

Upper pole left renal 1.9 cm cyst. Abdominal aortic atherosclerosis.

Musculoskeletal: Remote left rib fractures.
IMPRESSION: 1. No evidence of metastatic disease within the chest.
2. Congestive heart failure, with bilateral pleural effusions and
basilar predominant septal thickening.
3. Concurrent subpleural interstitial thickening is suspicious for
mild interstitial lung disease, nonspecific and of doubtful clinical
significance given patient age.
4. Bilateral nephrolithiasis.
5. Pulmonary artery enlargement suggests pulmonary arterial
hypertension.

## 2024-01-03 ENCOUNTER — Other Ambulatory Visit: Payer: Medicare Other

## 2024-01-03 ENCOUNTER — Ambulatory Visit: Payer: Medicare Other | Admitting: Oncology

## 2024-04-29 ENCOUNTER — Emergency Department

## 2024-04-29 ENCOUNTER — Other Ambulatory Visit: Payer: Self-pay

## 2024-04-29 ENCOUNTER — Inpatient Hospital Stay
Admission: EM | Admit: 2024-04-29 | Discharge: 2024-05-03 | DRG: 853 | Disposition: A | Discharge status: Home / Self Care | Attending: Obstetrics and Gynecology | Admitting: Obstetrics and Gynecology

## 2024-04-29 DIAGNOSIS — I051 Rheumatic mitral insufficiency: Secondary | ICD-10-CM | POA: Diagnosis present

## 2024-04-29 DIAGNOSIS — I272 Pulmonary hypertension, unspecified: Secondary | ICD-10-CM | POA: Diagnosis present

## 2024-04-29 DIAGNOSIS — Z66 Do not resuscitate: Secondary | ICD-10-CM | POA: Diagnosis present

## 2024-04-29 DIAGNOSIS — Z79899 Other long term (current) drug therapy: Secondary | ICD-10-CM

## 2024-04-29 DIAGNOSIS — C642 Malignant neoplasm of left kidney, except renal pelvis: Secondary | ICD-10-CM | POA: Diagnosis present

## 2024-04-29 DIAGNOSIS — I058 Other rheumatic mitral valve diseases: Secondary | ICD-10-CM | POA: Diagnosis present

## 2024-04-29 DIAGNOSIS — K219 Gastro-esophageal reflux disease without esophagitis: Secondary | ICD-10-CM | POA: Diagnosis present

## 2024-04-29 DIAGNOSIS — I130 Hypertensive heart and chronic kidney disease with heart failure and stage 1 through stage 4 chronic kidney disease, or unspecified chronic kidney disease: Secondary | ICD-10-CM | POA: Diagnosis present

## 2024-04-29 DIAGNOSIS — E8770 Fluid overload, unspecified: Secondary | ICD-10-CM | POA: Diagnosis present

## 2024-04-29 DIAGNOSIS — H9192 Unspecified hearing loss, left ear: Secondary | ICD-10-CM | POA: Diagnosis present

## 2024-04-29 DIAGNOSIS — I4811 Longstanding persistent atrial fibrillation: Secondary | ICD-10-CM | POA: Diagnosis present

## 2024-04-29 DIAGNOSIS — I5032 Chronic diastolic (congestive) heart failure: Secondary | ICD-10-CM | POA: Diagnosis present

## 2024-04-29 DIAGNOSIS — I959 Hypotension, unspecified: Secondary | ICD-10-CM | POA: Diagnosis present

## 2024-04-29 DIAGNOSIS — D539 Nutritional anemia, unspecified: Secondary | ICD-10-CM | POA: Diagnosis present

## 2024-04-29 DIAGNOSIS — W19XXXA Unspecified fall, initial encounter: Secondary | ICD-10-CM | POA: Diagnosis present

## 2024-04-29 DIAGNOSIS — Z7901 Long term (current) use of anticoagulants: Secondary | ICD-10-CM

## 2024-04-29 DIAGNOSIS — A419 Sepsis, unspecified organism: Principal | ICD-10-CM | POA: Diagnosis present

## 2024-04-29 DIAGNOSIS — I7143 Infrarenal abdominal aortic aneurysm, without rupture: Secondary | ICD-10-CM | POA: Diagnosis present

## 2024-04-29 DIAGNOSIS — G9341 Metabolic encephalopathy: Secondary | ICD-10-CM | POA: Diagnosis present

## 2024-04-29 DIAGNOSIS — E785 Hyperlipidemia, unspecified: Secondary | ICD-10-CM | POA: Diagnosis present

## 2024-04-29 DIAGNOSIS — R0902 Hypoxemia: Secondary | ICD-10-CM | POA: Diagnosis present

## 2024-04-29 DIAGNOSIS — Z23 Encounter for immunization: Secondary | ICD-10-CM | POA: Diagnosis not present

## 2024-04-29 DIAGNOSIS — L03113 Cellulitis of right upper limb: Secondary | ICD-10-CM | POA: Diagnosis present

## 2024-04-29 DIAGNOSIS — R6520 Severe sepsis without septic shock: Secondary | ICD-10-CM | POA: Diagnosis present

## 2024-04-29 DIAGNOSIS — D696 Thrombocytopenia, unspecified: Secondary | ICD-10-CM | POA: Diagnosis present

## 2024-04-29 DIAGNOSIS — Z85528 Personal history of other malignant neoplasm of kidney: Secondary | ICD-10-CM

## 2024-04-29 DIAGNOSIS — N1831 Chronic kidney disease, stage 3a: Secondary | ICD-10-CM | POA: Diagnosis present

## 2024-04-29 DIAGNOSIS — D649 Anemia, unspecified: Secondary | ICD-10-CM | POA: Diagnosis present

## 2024-04-29 DIAGNOSIS — R296 Repeated falls: Secondary | ICD-10-CM | POA: Diagnosis present

## 2024-04-29 DIAGNOSIS — Z8249 Family history of ischemic heart disease and other diseases of the circulatory system: Secondary | ICD-10-CM

## 2024-04-29 DIAGNOSIS — Z86711 Personal history of pulmonary embolism: Secondary | ICD-10-CM

## 2024-04-29 DIAGNOSIS — E8721 Acute metabolic acidosis: Secondary | ICD-10-CM | POA: Diagnosis present

## 2024-04-29 DIAGNOSIS — Z880 Allergy status to penicillin: Secondary | ICD-10-CM

## 2024-04-29 DIAGNOSIS — Z1152 Encounter for screening for COVID-19: Secondary | ICD-10-CM | POA: Diagnosis not present

## 2024-04-29 DIAGNOSIS — Z7984 Long term (current) use of oral hypoglycemic drugs: Secondary | ICD-10-CM

## 2024-04-29 DIAGNOSIS — S51821A Laceration with foreign body of right forearm, initial encounter: Secondary | ICD-10-CM | POA: Diagnosis present

## 2024-04-29 DIAGNOSIS — L089 Local infection of the skin and subcutaneous tissue, unspecified: Secondary | ICD-10-CM

## 2024-04-29 DIAGNOSIS — I2510 Atherosclerotic heart disease of native coronary artery without angina pectoris: Secondary | ICD-10-CM | POA: Diagnosis present

## 2024-04-29 DIAGNOSIS — I5030 Unspecified diastolic (congestive) heart failure: Secondary | ICD-10-CM

## 2024-04-29 DIAGNOSIS — C649 Malignant neoplasm of unspecified kidney, except renal pelvis: Secondary | ICD-10-CM

## 2024-04-29 DIAGNOSIS — Z87891 Personal history of nicotine dependence: Secondary | ICD-10-CM

## 2024-04-29 DIAGNOSIS — D509 Iron deficiency anemia, unspecified: Secondary | ICD-10-CM | POA: Diagnosis present

## 2024-04-29 DIAGNOSIS — Z882 Allergy status to sulfonamides status: Secondary | ICD-10-CM

## 2024-04-29 LAB — COMPREHENSIVE METABOLIC PANEL WITH GFR
ALT: 16 U/L (ref 0–44)
AST: 40 U/L (ref 15–41)
Albumin: 2.8 g/dL — ABNORMAL LOW (ref 3.5–5.0)
Alkaline Phosphatase: 80 U/L (ref 38–126)
Anion gap: 12 (ref 5–15)
BUN: 29 mg/dL — ABNORMAL HIGH (ref 8–23)
CO2: 24 mmol/L (ref 22–32)
Calcium: 8.4 mg/dL — ABNORMAL LOW (ref 8.9–10.3)
Chloride: 99 mmol/L (ref 98–111)
Creatinine, Ser: 1.33 mg/dL — ABNORMAL HIGH (ref 0.61–1.24)
GFR, Estimated: 49 mL/min — ABNORMAL LOW (ref >60)
Glucose, Bld: 103 mg/dL — ABNORMAL HIGH (ref 70–99)
Potassium: 4.9 mmol/L (ref 3.5–5.1)
Sodium: 135 mmol/L (ref 135–145)
Total Bilirubin: 3.2 mg/dL — ABNORMAL HIGH (ref 0.0–1.2)
Total Protein: 6.2 g/dL — ABNORMAL LOW (ref 6.5–8.1)

## 2024-04-29 LAB — CBC WITH DIFFERENTIAL/PLATELET
Abs Immature Granulocytes: 0.14 K/uL — ABNORMAL HIGH (ref 0.00–0.07)
Basophils Absolute: 0.1 K/uL (ref 0.0–0.1)
Basophils Relative: 0 %
Eosinophils Absolute: 0 K/uL (ref 0.0–0.5)
Eosinophils Relative: 0 %
HCT: 32.9 % — ABNORMAL LOW (ref 39.0–52.0)
Hemoglobin: 11 g/dL — ABNORMAL LOW (ref 13.0–17.0)
Immature Granulocytes: 1 %
Lymphocytes Relative: 7 %
Lymphs Abs: 1.1 K/uL (ref 0.7–4.0)
MCH: 36.1 pg — ABNORMAL HIGH (ref 26.0–34.0)
MCHC: 33.4 g/dL (ref 30.0–36.0)
MCV: 107.9 fL — ABNORMAL HIGH (ref 80.0–100.0)
Monocytes Absolute: 1.1 K/uL — ABNORMAL HIGH (ref 0.1–1.0)
Monocytes Relative: 7 %
Neutro Abs: 13.1 K/uL — ABNORMAL HIGH (ref 1.7–7.7)
Neutrophils Relative %: 85 %
Platelets: 104 K/uL — ABNORMAL LOW (ref 150–400)
RBC: 3.05 MIL/uL — ABNORMAL LOW (ref 4.22–5.81)
RDW: 16.1 % — ABNORMAL HIGH (ref 11.5–15.5)
WBC: 15.5 K/uL — ABNORMAL HIGH (ref 4.0–10.5)
nRBC: 0.1 % (ref 0.0–0.2)

## 2024-04-29 LAB — URINALYSIS, W/ REFLEX TO CULTURE (INFECTION SUSPECTED)
Bacteria, UA: NONE SEEN
Bilirubin Urine: NEGATIVE
Glucose, UA: >=500 mg/dL — AB
Ketones, ur: NEGATIVE mg/dL
Leukocytes,Ua: NEGATIVE
Nitrite: NEGATIVE
Protein, ur: NEGATIVE mg/dL
Specific Gravity, Urine: 1.008 (ref 1.005–1.030)
Squamous Epithelial / HPF: 0 /HPF (ref 0–5)
pH: 5 (ref 5.0–8.0)

## 2024-04-29 LAB — RESP PANEL BY RT-PCR (RSV, FLU A&B, COVID)  RVPGX2
Influenza A by PCR: NEGATIVE
Influenza B by PCR: NEGATIVE
Resp Syncytial Virus by PCR: NEGATIVE
SARS Coronavirus 2 by RT PCR: NEGATIVE

## 2024-04-29 LAB — RETICULOCYTES
Immature Retic Fract: 29 % — ABNORMAL HIGH (ref 2.3–15.9)
RBC.: 2.96 MIL/uL — ABNORMAL LOW (ref 4.22–5.81)
Retic Count, Absolute: 104.2 K/uL (ref 19.0–186.0)
Retic Ct Pct: 3.5 % — ABNORMAL HIGH (ref 0.4–3.1)

## 2024-04-29 LAB — PROTIME-INR
INR: 1.7 — ABNORMAL HIGH (ref 0.8–1.2)
Prothrombin Time: 21 s — ABNORMAL HIGH (ref 11.4–15.2)

## 2024-04-29 LAB — LACTIC ACID, PLASMA
Lactic Acid, Venous: 3 mmol/L (ref 0.5–1.9)
Lactic Acid, Venous: 1.8 mmol/L (ref 0.5–1.9)

## 2024-04-29 MED ORDER — ACETAMINOPHEN 325 MG PO TABS: 650.0000 mg | ORAL_TABLET | Freq: Four times a day (QID) | ORAL | Status: DC | PRN

## 2024-04-29 MED ORDER — MIDODRINE HCL 5 MG PO TABS
5.0000 mg | ORAL_TABLET | Freq: Two times a day (BID) | ORAL | Status: DC
  Administered 2024-04-30 – 2024-05-03 (×7): 5 mg via ORAL
  Filled 2024-04-29 (×7): qty 1

## 2024-04-29 MED ORDER — OXYCODONE HCL 5 MG PO TABS
5.0000 mg | ORAL_TABLET | ORAL | Status: DC | PRN
  Administered 2024-05-01 (×2): 5 mg via ORAL
  Filled 2024-04-29 (×2): qty 1

## 2024-04-29 MED ORDER — EMPAGLIFLOZIN 10 MG PO TABS
10.0000 mg | ORAL_TABLET | Freq: Every day | ORAL | Status: DC
  Administered 2024-04-30 – 2024-05-03 (×4): 10 mg via ORAL
  Filled 2024-04-29 (×4): qty 1

## 2024-04-29 MED ORDER — APIXABAN 5 MG PO TABS
5.0000 mg | ORAL_TABLET | Freq: Two times a day (BID) | ORAL | Status: DC
Start: 2024-04-29 — End: 2024-04-30
  Administered 2024-04-30 (×2): 5 mg via ORAL
  Filled 2024-04-29 (×2): qty 1

## 2024-04-29 MED ORDER — TRAZODONE HCL 50 MG PO TABS
100.0000 mg | ORAL_TABLET | Freq: Every day | ORAL | Status: DC
Start: 2024-04-29 — End: 2024-05-03
  Administered 2024-04-30 – 2024-05-02 (×4): 100 mg via ORAL
  Filled 2024-04-29 (×4): qty 2

## 2024-04-29 MED ORDER — LACTATED RINGERS IV BOLUS
1000.0000 mL | Freq: Once | INTRAVENOUS | Status: AC
  Administered 2024-04-29: 1000 mL via INTRAVENOUS

## 2024-04-29 MED ORDER — ONDANSETRON HCL 4 MG PO TABS: 4.0000 mg | ORAL_TABLET | Freq: Four times a day (QID) | ORAL | Status: DC | PRN

## 2024-04-29 MED ORDER — IOHEXOL 300 MG/ML  SOLN
100.0000 mL | Freq: Once | INTRAMUSCULAR | Status: AC | PRN
Start: 2024-04-29 — End: 2024-04-29
  Administered 2024-04-29: 100 mL via INTRAVENOUS

## 2024-04-29 MED ORDER — ACETAMINOPHEN 325 MG RE SUPP: 650.0000 mg | Freq: Four times a day (QID) | RECTAL | Status: DC | PRN

## 2024-04-29 MED ORDER — SODIUM CHLORIDE 0.9 % IV SOLN
1.0000 g | Freq: Once | INTRAVENOUS | Status: AC
  Administered 2024-04-29: 1 g via INTRAVENOUS
  Filled 2024-04-29: qty 20

## 2024-04-29 MED ORDER — SODIUM CHLORIDE 0.9 % IV SOLN
1.0000 g | Freq: Two times a day (BID) | INTRAVENOUS | Status: DC
  Administered 2024-04-30: 1 g via INTRAVENOUS
  Filled 2024-04-29 (×2): qty 20

## 2024-04-29 MED ORDER — TETANUS-DIPHTH-ACELL PERTUSSIS 5-2-15.5 LF-MCG/0.5 IM SUSP
0.5000 mL | Freq: Once | INTRAMUSCULAR | Status: AC
  Administered 2024-04-29: 0.5 mL via INTRAMUSCULAR
  Filled 2024-04-29: qty 0.5

## 2024-04-29 MED ORDER — SODIUM CHLORIDE 0.9 % IV BOLUS (SEPSIS)
1500.0000 mL | Freq: Once | INTRAVENOUS | Status: AC
  Administered 2024-04-29: 1500 mL via INTRAVENOUS

## 2024-04-29 MED ORDER — VANCOMYCIN HCL IN DEXTROSE 1-5 GM/200ML-% IV SOLN
1000.0000 mg | Freq: Once | INTRAVENOUS | Status: AC
  Administered 2024-04-29: 1000 mg via INTRAVENOUS
  Filled 2024-04-29: qty 200

## 2024-04-29 MED ORDER — LACTATED RINGERS IV SOLN
150.0000 mL/h | INTRAVENOUS | Status: AC
Start: 2024-04-29 — End: 2024-04-30
  Administered 2024-04-30 (×3): 150 mL/h via INTRAVENOUS

## 2024-04-29 MED ORDER — LIDOCAINE-EPINEPHRINE 2 %-1:100000 IJ SOLN
20.0000 mL | Freq: Once | INTRAMUSCULAR | Status: AC
  Administered 2024-04-29: 20 mL
  Filled 2024-04-29: qty 1

## 2024-04-29 MED ORDER — VANCOMYCIN HCL 500 MG/100ML IV SOLN
500.0000 mg | INTRAVENOUS | Status: DC
  Administered 2024-04-30: 500 mg via INTRAVENOUS
  Filled 2024-04-29 (×2): qty 100

## 2024-04-29 MED ORDER — ONDANSETRON HCL 4 MG/2ML IJ SOLN: 4.0000 mg | Freq: Four times a day (QID) | INTRAMUSCULAR | Status: DC | PRN

## 2024-04-29 NOTE — Progress Notes (Signed)
 Pharmacy Antibiotic Note  Marcus Kemp is a 89 y.o. male admitted on 04/29/2024 with cellulitis/sepsis.  Pharmacy has been consulted for Meropenem and Vancomycin  dosing.  Plan: Meropenem 1 gm q12hr per indication & renal fxn.  Pt given Vancomycin  1000 mg once. Vancomycin  500 mg IV Q 18 hrs. Goal AUC 400-550. Expected AUC: 513.8 SCr used: 1.33  Follow up culture results to assess for antibiotic optimization. Monitor renal function to assess for any necessary antibiotic dosing changes. Pharmacy will continue to follow and will adjust abx dosing whenever warranted.  Temp (24hrs), Avg:98.3 F (36.8 C), Min:97.8 F (36.6 C), Max:99 F (37.2 C)   Recent Labs  Lab 04/29/24 1652 04/29/24 1902  WBC 15.5*  --   CREATININE 1.33*  --   LATICACIDVEN 3.0* 1.8    Estimated Creatinine Clearance: 28.3 mL/min (A) (by C-G formula based on SCr of 1.33 mg/dL (H)).    Allergies  Allergen Reactions   Penicillins Anaphylaxis   Sulfa Antibiotics Nausea Only    Antimicrobials this admission: 10/05 Meropenem >>  10/05 Vancomycin  >>   Microbiology results: 10/05 BCx: Pending  Thank you for allowing pharmacy to be a part of this patient's care.  Rankin CANDIE Dills, PharmD, [facility] 04/29/2024 11:37 PM

## 2024-04-29 NOTE — ED Notes (Signed)
 Pt continuously hitting the call light. Warm blankets changed. Pressure bandage in placed to right forearm; no visible bleeding through dressing. Coban removed from left arm; IVs both intact; fall risk sock placed over IVs to help with agitation

## 2024-04-29 NOTE — ED Notes (Signed)
 Tube station down delay in lab work getting sent to lab.

## 2024-04-29 NOTE — Assessment & Plan Note (Addendum)
 History of rheumatic tricuspid regurgitation Clinically euvolemic to dry CT chest showing stable cardiomegaly, small pleural effusions, possible pulmonary edema Continue cautious IV fluid resuscitation with close monitoring for fluid overload Daily weights with intake and output monitoring

## 2024-04-29 NOTE — ED Notes (Signed)
 Blood cultures and lactic drawn at this time.

## 2024-04-29 NOTE — Assessment & Plan Note (Addendum)
 Infected laceration right forearm History of severe penicillin allergy (anaphylaxis) Sepsis criteria include low-grade temp, tachycardia and tachypnea with hypotension, altered mental status, WBC 15,000, lactic acid 1.8 Continue meropenem and vancomycin  Sepsis fluids monitoring for fluid overload in view of CHF history/rheumatic tricuspid regurgitation Follow-up blood cultures Wound care Pain control Can consider surgical consult-wound borders were loosely approximated in the ED

## 2024-04-29 NOTE — Assessment & Plan Note (Addendum)
 Stable findings on CT abdomen and pelvis Per oncology note 01/2023: In view of advanced age .SABRASABRApatient and his wife are not interested in any surgery or biopsy

## 2024-04-29 NOTE — Assessment & Plan Note (Signed)
 Renal function at most recent baseline Continue to monitor

## 2024-04-29 NOTE — Assessment & Plan Note (Addendum)
 Thrombocytopenia Previously evaluated by oncology increased LDH likely due to increased RBC turnover secondary to destruction from heart valve disease or presumed kidney cancer--- no further workup in view of advanced age Hemoglobin 11, down from 12.2 a couple weeks prior Platelets 104, down from 125 a couple weeks prior CT chest abdomen pelvis with contrast unrevealing for etiology Will get anemia panel Can consider hematology consult inpatient versus outpatient

## 2024-04-29 NOTE — ED Provider Notes (Addendum)
 [facility] Provider Note    Event Date/Time   First MD Initiated Contact with Patient 04/29/24 1648     (approximate)   History   Chief Complaint: Altered Mental Status   HPI  Marcus Kemp is a 89 y.o. male with a history of atrial fibrillation on Eliquis , CHF, GERD, pulmonary hypertension who was brought to the ED due to confusion for the last 2 days associated generalized weakness.  Had a fall 2 days ago, injuring his right forearm which was cleaned and dressed at home.  Patient denies acute pain shortness of breath cough or fever.      Past Medical History:  Diagnosis Date   A-fib [facility])    a.) CHA2DS2-VASc = 4 (age x 2, CHF, aortic plaque). b.) s/p unsuccessful TEE cardioversion 04/28/2016 --> rec'd 200 J x1 and 360 J x 2. c.) rate/rhythm maintained on oral metoprolol  succinante; chronically anticoagulated using dose reduced apixaban .   Anemia    Aortic atherosclerosis    Arthritis    CHF (congestive heart failure) (HCC)    a.) TTE 04/26/2016: EF >55%; mod LVH; MVP; mild-mod LAE, mild MR. b.) TTE 05/14/209: EF >55%; mod RVE; mild BAE; mod MR.TR; triv AR/PR. c.) TTE 03/28/2019: EF >55%; mild LVH; mod BAE; mild PR, mod MR/TR.   GERD (gastroesophageal reflux disease)    Heart murmur    History of kidney stones    HOH (hard of hearing)    a.) hearing aid to LEFT ear.   Hyperlipidemia    Hypertension    Infrarenal abdominal aortic aneurysm (AAA) without rupture 05/05/2021   a.) CT 05/05/2021 --> measured 3.0 cm.   MVP (mitral valve prolapse)    Peripheral edema    Pneumonia    Pulmonary embolus (HCC)    Pulmonary HTN (HCC) 04/26/2016   a.) TTE 04/26/2016: EF >55%; PASP 45. b.) TTE 12/06/2017: EF >55%; RVSP 76.6 mmHg. c.) TTE 03/28/2019: EF >55%; RVSP 77.8 mmHg.   Renal cell carcinoma (HCC)    Rheumatic fever    Sepsis (HCC)    Valvular heart disease     Current Outpatient Rx   Order #: [id number] Class: Historical Med   Order #:  [id number] Class: Historical Med   Order #: [id number] Class: Historical Med   Order #: [id number] Class: Normal   Order #: [id number] Class: Historical Med   Order #: [id number] Class: Normal   Order #: [id number] Class: Historical Med   Order #: [id number] Class: Normal   Order #: [id number] Class: Historical Med   Order #: [id number] Class: Normal    Past Surgical History:  Procedure Laterality Date   INGUINAL HERNIA REPAIR Left 01/13/2022   Procedure: HERNIA REPAIR INGUINAL ADULT;  Surgeon: Dessa Reyes ORN, MD;  Location: [facility] ORS;  Service: General;  Laterality: Left;   INSERTION OF MESH  01/13/2022   Procedure: INSERTION OF MESH;  Surgeon: Dessa Reyes ORN, MD;  Location: [facility] ORS;  Service: General;;   LITHOTRIPSY     TEE WITH CARDIOVERSION N/A    P   TONSILLECTOMY      Physical Exam   Triage Vital Signs: ED Triage Vitals  Encounter Vitals Group     BP --      Girls Systolic BP Percentile --      Girls Diastolic BP Percentile --      Boys Systolic BP Percentile --      Boys Diastolic BP Percentile --      Pulse Rate 04/29/24 1648 100     Resp 04/29/24  1648 (!) 24     Temp 04/29/24 1649 99 F (37.2 C)     Temp Source 04/29/24 1648 Oral     SpO2 04/29/24 1648 96 %     Weight 04/29/24 1641 142 lb 11.2 oz (64.7 kg)     Height 04/29/24 1641 5' 5 (1.651 m)     Head Circumference --      Peak Flow --      Pain Score --      Pain Loc --      Pain Education --      Exclude from Growth Chart --     Most recent vital signs: Vitals:   04/29/24 2100 04/29/24 2113  BP: (!) 101/56   Pulse: 95   Resp: 19   Temp:  98.2 F (36.8 C)  SpO2: 99%     General: Awake, no distress.  CV:  Good peripheral perfusion.  Tachycardia heart rate 100 Resp:  Normal effort.  Tachypnea respiratory rate 25 Abd:  No distention.  Soft with suprapubic tenderness Other:  Dry oral mucosa.  7 cm irregular laceration through the dorsal right forearm with continuous venous bleeding.   ED Results /  Procedures / Treatments   Labs (all labs ordered are listed, but only abnormal results are displayed) Labs Reviewed  COMPREHENSIVE METABOLIC PANEL WITH GFR - Abnormal; Notable for the following components:      Result Value   Glucose, Bld 103 (*)    BUN 29 (*)    Creatinine, Ser 1.33 (*)    Calcium 8.4 (*)    Total Protein 6.2 (*)    Albumin 2.8 (*)    Total Bilirubin 3.2 (*)    GFR, Estimated 49 (*)    All other components within normal limits  LACTIC ACID, PLASMA - Abnormal; Notable for the following components:   Lactic Acid, Venous 3.0 (*)    All other components within normal limits  CBC WITH DIFFERENTIAL/PLATELET - Abnormal; Notable for the following components:   WBC 15.5 (*)    RBC 3.05 (*)    Hemoglobin 11.0 (*)    HCT 32.9 (*)    MCV 107.9 (*)    MCH 36.1 (*)    RDW 16.1 (*)    Platelets 104 (*)    Neutro Abs 13.1 (*)    Monocytes Absolute 1.1 (*)    Abs Immature Granulocytes 0.14 (*)    All other components within normal limits  PROTIME-INR - Abnormal; Notable for the following components:   Prothrombin Time 21.0 (*)    INR 1.7 (*)    All other components within normal limits  URINALYSIS, W/ REFLEX TO CULTURE (INFECTION SUSPECTED) - Abnormal; Notable for the following components:   Color, Urine YELLOW (*)    APPearance CLEAR (*)    Glucose, UA >=500 (*)    Hgb urine dipstick SMALL (*)    All other components within normal limits  CULTURE, BLOOD (ROUTINE X 2)  CULTURE, BLOOD (ROUTINE X 2)  RESP PANEL BY RT-PCR (RSV, FLU A&B, COVID)  [id number]  LACTIC ACID, PLASMA     EKG Interpreted by me Sinus rhythm rate of 96.  Normal axis and intervals.  No acute ischemic changes.  1 PVC on the strip.   RADIOLOGY X-ray right forearm interpreted by me, no fracture.  Radiology report reviewed CT chest abdomen pelvis negative for acute findings  PROCEDURES:  .Critical Care  Performed by: Viviann Pastor, MD Authorized by: Viviann Pastor, MD   Critical  care  provider statement:    Critical care time (minutes):  35   Critical care time was exclusive of:  Separately billable procedures and treating other patients   Critical care was necessary to treat or prevent imminent or life-threatening deterioration of the following conditions:  Sepsis and dehydration   Critical care was time spent personally by me on the following activities:  Development of treatment plan with patient or surrogate, discussions with consultants, evaluation of patient's response to treatment, examination of patient, obtaining history from patient or surrogate, ordering and performing treatments and interventions, ordering and review of laboratory studies, ordering and review of radiographic studies, pulse oximetry, re-evaluation of patient's condition and review of old charts .Laceration Repair  Date/Time: 04/29/2024 9:22 PM  Performed by: Viviann Pastor, MD Authorized by: Viviann Pastor, MD   Consent:    Consent obtained:  Verbal   Consent given by:  Patient and healthcare agent   Risks discussed:  Infection, pain, poor cosmetic result, poor wound healing and retained foreign body   Alternatives discussed:  No treatment Universal protocol:    Patient identity confirmed:  Verbally with patient and arm band Anesthesia:    Anesthesia method:  Local infiltration   Local anesthetic:  Lidocaine  1% WITH epi Laceration details:    Location:  Shoulder/arm   Shoulder/arm location:  R lower arm   Length (cm):  7 Pre-procedure details:    Preparation:  Patient was prepped and draped in usual sterile fashion and imaging obtained to evaluate for foreign bodies Exploration:    Hemostasis achieved with:  Epinephrine , tied off vessels and direct pressure   Imaging obtained: x-ray     Imaging outcome: foreign body not noted     Wound exploration: wound explored through full range of motion and entire depth of wound visualized     Wound extent: foreign bodies/material     Wound  extent: fascia not violated, no signs of injury, no nerve damage, no tendon damage, no underlying fracture and no vascular damage     Foreign bodies/material:  Mulch   Contaminated: yes   Treatment:    Area cleansed with:  Povidone-iodine and saline   Amount of cleaning:  Extensive   Irrigation solution:  Sterile saline   Irrigation volume:  1000   Irrigation method:  Pressure wash   Visualized foreign bodies/material removed: yes     Debridement:  Moderate   Undermining:  Extensive   Scar revision: no     Layers/structures repaired:  Deep dermal/superficial fascia Deep dermal/superficial fascia:    Suture size:  4-0   Suture material:  Monocryl   Suture technique:  Horizontal mattress   Number of sutures:  4 Skin repair:    Repair method:  Sutures   Suture size:  4-0   Suture material:  Fast-absorbing gut   Suture technique:  Running   Number of sutures:  8 Approximation:    Approximation:  Loose Repair type:    Repair type:  Complex Post-procedure details:    Dressing:  Sterile dressing   Procedure completion:  Tolerated well, no immediate complications    MEDICATIONS ORDERED IN ED: Medications  vancomycin  (VANCOCIN ) IVPB 1000 mg/200 mL premix (1,000 mg Intravenous New Bag/Given 04/29/24 2151)  sodium chloride  0.9 % bolus 1,500 mL (0 mLs Intravenous Stopped 04/29/24 1813)  meropenem (MERREM) 1 g in sodium chloride  0.9 % 100 mL IVPB (0 g Intravenous Stopped 04/29/24 1738)  lidocaine -EPINEPHrine  (XYLOCAINE  W/EPI) 2 %-1:100000 (with pres) injection 20 mL (20  mLs Infiltration Given by Other 04/29/24 1916)  lactated ringers  bolus 1,000 mL (1,000 mLs Intravenous New Bag/Given 04/29/24 2110)  iohexol  (OMNIPAQUE ) 300 MG/ML solution 100 mL (100 mLs Intravenous Contrast Given 04/29/24 2035)     IMPRESSION / MDM / ASSESSMENT AND PLAN / ED COURSE  I reviewed the triage vital signs and the nursing notes.  DDx: Pneumonia, UTI, COVID, influenza, cellulitis, septic shock, electrolyte  derangement, anemia, AKI, diverticulitis  Patient's presentation is most consistent with acute presentation with potential threat to life or bodily function.  Patient presents with hypotension, appears very dehydrated, with tachycardia tachypnea.  Labs show leukocytosis and initial lactate of 3.0.  Patient given 30 mL/kg fluid bolus for initial resuscitation.  Meropenem for empiric antibiotics for suspected septic shock related to urinary source.   Clinical Course as of 04/29/24 2208  [place] Apr 29, 2024  2014 R forearm wound extensively debrided, irrigated, repaired for control of persistent venous bleeding. [PS]  2015 BP normalized, 100/60 after 2 L IV fluid bolus.  Vasopressors not indicated. [PS]  2207 Case d/w hospitalist [PS]    Clinical Course User Index [PS] Viviann Pastor, MD    ----------------------------------------- 9:22 PM on 04/29/2024 ----------------------------------------- CT reassuring, vital signs stabilized.  Will need to admit for further management.   FINAL CLINICAL IMPRESSION(S) / ED DIAGNOSES   Final diagnoses:  Sepsis, due to unspecified organism, unspecified whether acute organ dysfunction present [facility])     Rx / DC Orders   ED Discharge Orders     None        Note:  This document was prepared using Dragon voice recognition software and may include unintentional dictation errors.   Viviann Pastor, MD 04/29/24 2124    Viviann Pastor, MD 04/29/24 [id number]

## 2024-04-29 NOTE — ED Notes (Addendum)
 MD made aware of BP. Fluids hung.

## 2024-04-29 NOTE — H&P (Signed)
 History and Physical    Patient: Marcus Kemp FMW:[id number] DOB: 1925/03/11 DOA: 04/29/2024 DOS: the patient was seen and examined on 04/29/2024 PCP: Auston Reyes [name], MD  Patient coming from: Home  Chief Complaint:  Chief Complaint  Patient presents with   Altered Mental Status    HPI: Marcus Kemp is a 89 y.o. male with medical history significant for HFpEF, prior PE, persistent A-fib on Eliquis , pulmonary hypertension, renal cell carcinoma, rheumatic heart disease, CKD stage IIIa, hypotension with prior need for midodrine , who is being admitted with sepsis secondary to an infected laceration of the right forearm, which he sustained when he fell outdoors a couple days prior. Reportedly, patient has been having recent falls over the past few days resulting in a few falls.  When he cut his right forearm family washed it out with a garden hose.  Over the course of the last couple days however he has had a change in mentation.  EMS was initially called out for trouble shortness of breath. In the ED, Tmax 99 tachycardic from 93-109, tachypneic to the 20s with initial BP 79/49, improving to 101/56 with IV fluids.  O2 sat in the high 90s on room air. Labs notable for WBC 15,000 and lactic acid 1.8 Hemoglobin 11 (12.2, 04/11/24) Platelets 104 (125, 04/11/2024)) Creatinine 1.33 (baseline 1.1, 11/2023) INR 1.7 UA unremarkable Respiratory viral panel pending EKG not done CT chest abdomen and pelvis with contrast showing unchanged left kidney mass consistent with RCC.  For the most part nonacute showing small bilateral effusions and interlobular septal thickening consistent with pulmonary edema, cardiomegaly and CAD, pulmonary hypertension with similar findings to CT from a year ago with gallbladder thickening and nonobstructive right nephrolithiasis--please see full report X-ray of the right humerus and forearm without evidence of fracture.  Patient was started on meropenem and vancomycin   and sepsis fluids Patient received a tetanus booster His right forearm wound was irrigated and loosely approximated by ED provider   Admission requested.     Past Medical History:  Diagnosis Date   A-fib [facility])    a.) CHA2DS2-VASc = 4 (age x 2, CHF, aortic plaque). b.) s/p unsuccessful TEE cardioversion 04/28/2016 --> rec'd 200 J x1 and 360 J x 2. c.) rate/rhythm maintained on oral metoprolol  succinante; chronically anticoagulated using dose reduced apixaban .   Anemia    Aortic atherosclerosis    Arthritis    CHF (congestive heart failure) (HCC)    a.) TTE 04/26/2016: EF >55%; mod LVH; MVP; mild-mod LAE, mild MR. b.) TTE 05/14/209: EF >55%; mod RVE; mild BAE; mod MR.TR; triv AR/PR. c.) TTE 03/28/2019: EF >55%; mild LVH; mod BAE; mild PR, mod MR/TR.   GERD (gastroesophageal reflux disease)    Heart murmur    History of kidney stones    HOH (hard of hearing)    a.) hearing aid to LEFT ear.   Hyperlipidemia    Hypertension    Infrarenal abdominal aortic aneurysm (AAA) without rupture 05/05/2021   a.) CT 05/05/2021 --> measured 3.0 cm.   MVP (mitral valve prolapse)    Peripheral edema    Pneumonia    Pulmonary embolus (HCC)    Pulmonary HTN (HCC) 04/26/2016   a.) TTE 04/26/2016: EF >55%; PASP 45. b.) TTE 12/06/2017: EF >55%; RVSP 76.6 mmHg. c.) TTE 03/28/2019: EF >55%; RVSP 77.8 mmHg.   Renal cell carcinoma (HCC)    Rheumatic fever    Sepsis (HCC)    Valvular heart disease    Past Surgical  History:  Procedure Laterality Date   INGUINAL HERNIA REPAIR Left 01/13/2022   Procedure: HERNIA REPAIR INGUINAL ADULT;  Surgeon: Dessa Reyes ORN, MD;  Location: [facility] ORS;  Service: General;  Laterality: Left;   INSERTION OF MESH  01/13/2022   Procedure: INSERTION OF MESH;  Surgeon: Dessa Reyes ORN, MD;  Location: [facility] ORS;  Service: General;;   LITHOTRIPSY     TEE WITH CARDIOVERSION N/A    P   TONSILLECTOMY     Social History:  reports that he quit smoking about 85 years ago. His  smoking use included cigarettes. He started smoking about 93 years ago. He has a 2 pack-year smoking history. He has never used smokeless tobacco. He reports that he does not drink alcohol and does not use drugs.  Allergies  Allergen Reactions   Penicillins Anaphylaxis   Sulfa Antibiotics Nausea Only    Family History  Problem Relation Age of Onset   Heart disease Sister    Hypertension Other     Prior to Admission medications   Medication Sig Start Date End Date Taking? Authorizing Provider  ELIQUIS  2.5 MG TABS tablet Take 2.5 mg by mouth every 12 (twelve) hours. 01/16/22  Yes [provider]  traZODone  (DESYREL ) 100 MG tablet Take 100 mg by mouth at bedtime. 04/11/24 05/11/24 Yes [provider]  apixaban  (ELIQUIS ) 5 MG TABS tablet Take 5 mg by mouth 2 (two) times daily. 10/18/22   [provider]  Azelastine HCl 137 MCG/SPRAY SOLN Place 1 spray into the nose 2 (two) times daily as needed. 12/08/21   [provider]  cetirizine (ZYRTEC) 10 MG tablet SMARTSIG:1 pill By Mouth Daily PRN 12/13/21   [provider]  empagliflozin  (JARDIANCE ) 10 MG TABS tablet TAKE 1 TABLET BY MOUTH EVERY DAY 10/24/23   [place] A, FNP  fluorouracil (EFUDEX) 5 % cream Apply 1 Application topically 2 (two) times daily. 11/24/22   [provider]  midodrine  (PROAMATINE ) 5 MG tablet Take 1 tablet (5 mg total) by mouth 2 (two) times daily with a meal. For SBP <100 01/26/23   [place] A, FNP  rOPINIRole  (REQUIP ) 1 MG tablet Take 1 mg by mouth at bedtime. 10/13/22 10/13/23  [provider]  torsemide  (DEMADEX ) 20 MG tablet TAKE 1 TABLET (20 MG TOTAL) BY MOUTH 2 (TWO) TIMES DAILY. 20MG  IN THE MORNING AND 20MG  IN THE EVENING 11/15/23   [place] LABOR, FNP  vitamin B-12 (CYANOCOBALAMIN ) 500 MCG tablet Take 1 tablet (500 mcg total) by mouth daily. 12/29/21   Babara Call, MD    Physical Exam: Vitals:   04/29/24 2030 04/29/24 2100 04/29/24 2113 04/29/24 2235   BP: 92/76 (!) 101/56  (!) 108/59  Pulse: (!) 104 95    Resp: (!) 23 19    Temp:   98.2 F (36.8 C)   TempSrc:   Oral   SpO2: 98% 99%    Weight:      Height:       Physical Exam Vitals and nursing note reviewed.  Constitutional:      General: He is not in acute distress.    Comments: Elderly male in no distress, hard of hearing  HENT:     Head: Normocephalic and atraumatic.  Cardiovascular:     Rate and Rhythm: Regular rhythm. Tachycardia present.     Heart sounds: Normal heart sounds.  Pulmonary:     Effort: Tachypnea present.     Breath sounds: Normal breath sounds.  Abdominal:  Palpations: Abdomen is soft.     Tenderness: There is no abdominal tenderness.  Musculoskeletal:     Comments: Right forearm and elbow and bandage Multiple bruising on upper and lower extremities  Neurological:     Mental Status: Mental status is at baseline.     Labs on Admission: I have personally reviewed following labs and imaging studies  CBC: Recent Labs  Lab 04/29/24 1652  WBC 15.5*  NEUTROABS 13.1*  HGB 11.0*  HCT 32.9*  MCV 107.9*  PLT 104*   Basic Metabolic Panel: Recent Labs  Lab 04/29/24 1652  NA 135  K 4.9  CL 99  CO2 24  GLUCOSE 103*  BUN 29*  CREATININE 1.33*  CALCIUM 8.4*   GFR: Estimated Creatinine Clearance: 28.3 mL/min (A) (by C-G formula based on SCr of 1.33 mg/dL (H)). Liver Function Tests: Recent Labs  Lab 04/29/24 1652  AST 40  ALT 16  ALKPHOS 80  BILITOT 3.2*  PROT 6.2*  ALBUMIN 2.8*   No results for input(s): LIPASE, AMYLASE in the last 168 hours. No results for input(s): AMMONIA in the last 168 hours. Coagulation Profile: Recent Labs  Lab 04/29/24 1652  INR 1.7*   Cardiac Enzymes: No results for input(s): CKTOTAL, CKMB, CKMBINDEX, TROPONINI in the last 168 hours. BNP (last 3 results) No results for input(s): PROBNP in the last 8760 hours. HbA1C: No results for input(s): HGBA1C in the last 72 hours. CBG: No  results for input(s): GLUCAP in the last 168 hours. Lipid Profile: No results for input(s): CHOL, HDL, LDLCALC, TRIG, CHOLHDL, LDLDIRECT in the last 72 hours. Thyroid Function Tests: No results for input(s): TSH, T4TOTAL, FREET4, T3FREE, THYROIDAB in the last 72 hours. Anemia Panel: No results for input(s): VITAMINB12, FOLATE, FERRITIN, TIBC, IRON, RETICCTPCT in the last 72 hours. Urine analysis:    Component Value Date/Time   COLORURINE YELLOW (A) 04/29/2024 1902   APPEARANCEUR CLEAR (A) 04/29/2024 1902   LABSPEC 1.008 04/29/2024 1902   PHURINE 5.0 04/29/2024 1902   GLUCOSEU >=500 (A) 04/29/2024 1902   HGBUR SMALL (A) 04/29/2024 1902   BILIRUBINUR NEGATIVE 04/29/2024 1902   KETONESUR NEGATIVE 04/29/2024 1902   PROTEINUR NEGATIVE 04/29/2024 1902   NITRITE NEGATIVE 04/29/2024 1902   LEUKOCYTESUR NEGATIVE 04/29/2024 1902    Radiological Exams on Admission: CT CHEST ABDOMEN PELVIS W CONTRAST Result Date: 04/29/2024 CLINICAL DATA:  Sepsis, left renal cell carcinoma * Tracking Code: BO * EXAM: CT CHEST, ABDOMEN, AND PELVIS WITH CONTRAST TECHNIQUE: Multidetector CT imaging of the chest, abdomen and pelvis was performed following the standard protocol during bolus administration of intravenous contrast. RADIATION DOSE REDUCTION: This exam was performed according to the departmental dose-optimization program which includes automated exposure control, adjustment of the mA and/or kV according to patient size and/or use of iterative reconstruction technique. CONTRAST:  OMNIPAQUE  IOHEXOL  300 MG/ML  SOLN COMPARISON:  CT abdomen pelvis, 12/30/2022 FINDINGS: CT CHEST FINDINGS Cardiovascular: Aortic atherosclerosis. Cardiomegaly. Three-vessel coronary artery calcifications. Enlargement of the main pulmonary artery measuring up to 3.7 cm in caliber. No pericardial effusion. Mediastinum/Nodes: No enlarged mediastinal, hilar, or axillary lymph nodes. Thyroid gland,  trachea, and esophagus demonstrate no significant findings. Lungs/Pleura: Small bilateral pleural effusions and associated atelectasis or consolidation. Interlobular septal thickening throughout consistent with pulmonary edema. Musculoskeletal: No chest wall abnormality. No acute osseous findings. CT ABDOMEN PELVIS FINDINGS Hepatobiliary: No solid liver abnormality is seen. Mild gallbladder wall thickening. No visible gallstones. Intra and extrahepatic biliary ductal dilatation, common bile duct measuring up to 0.9  cm in caliber and appearing to taper to the ampulla without calculus or other obstruction visible. Appearance is similar to prior examination dated 12/30/2022. Pancreas: Unremarkable. No pancreatic ductal dilatation or surrounding inflammatory changes. Spleen: Normal in size without significant abnormality. Adrenals/Urinary Tract: Adrenal glands are unremarkable. Unchanged heterogeneously enhancing mass arising from the posterior midportion of the left kidney measuring 2.1 x 1.8 cm (series 2, image 62). Nonobstructive right renal calculi. No ureteral calculi or hydronephrosis. Bladder is unremarkable. Stomach/Bowel: Stomach is within normal limits. Appendix appears normal. No evidence of bowel wall thickening, distention, or inflammatory changes. Sigmoid diverticulosis. Vascular/Lymphatic: Severe aortic atherosclerosis. No enlarged abdominal or pelvic lymph nodes. Reproductive: No mass or other abnormality. Other: No abdominal wall hernia or abnormality. No ascites. Musculoskeletal: No acute osseous findings. Disc degenerative disease and bridging osteophytosis throughout the thoracic and lumbar spine, in keeping with DISH. IMPRESSION: 1. Small bilateral pleural effusions and associated atelectasis or consolidation. Interlobular septal thickening throughout consistent with pulmonary edema. 2. Cardiomegaly and coronary artery disease. Enlargement of the main pulmonary artery, as can be seen in pulmonary  hypertension. 3. Mild gallbladder wall thickening. No visible gallstones. Intra and extrahepatic biliary ductal dilatation, common bile duct measuring up to 0.9 cm in caliber and appearing to taper to the ampulla without calculus or other obstruction visible. Appearance is similar to prior examination dated 12/30/2022. Correlate with biochemical findings and consider ultrasound or MRCP to further evaluate if indicated by clinical concern for cholecystitis. 4. Unchanged heterogeneously enhancing mass arising from the posterior midportion of the left kidney measuring 2.1 x 1.8 cm, consistent with renal cell carcinoma. 5. No evidence of lymphadenopathy or metastatic disease in the chest, abdomen, or pelvis. Particularly, a previously seen enlarged, hypodense aortocaval lymph node is resolved on today's examination. 6. Nonobstructive right nephrolithiasis.  No hydronephrosis. Aortic Atherosclerosis (ICD10-I70.0). Electronically Signed   By: Marolyn [name] Jaksch M.D.   On: 04/29/2024 20:44   DG Forearm Right Result Date: 04/29/2024 CLINICAL DATA:  right upper arm pain and right forearm pain after fall EXAM: RIGHT FOREARM - 2 VIEW COMPARISON:  None Available. FINDINGS: Diffuse osteopenia.No acute fracture or dislocation. There is no evidence of arthropathy or other focal bone abnormality. Gauze material overlying the proximal aspect of the dorsal forearm. IMPRESSION: Diffuse osteopenia. No acute fracture or malalignment of the radius and ulna. Electronically Signed   By: Rogelia Myers M.D.   On: 04/29/2024 17:25   DG Humerus Right Result Date: 04/29/2024 CLINICAL DATA:  right upper arm pain and right forearm pain after fall EXAM: RIGHT HUMERUS - 2+ VIEW COMPARISON:  None Available. FINDINGS: Osteopenia.No acute fracture or dislocation. There is no evidence of arthropathy or other focal bone abnormality. Soft tissues are unremarkable. IMPRESSION: Diffuse osteopenia.  No acute fracture or dislocation. Electronically Signed    By: Rogelia Myers M.D.   On: 04/29/2024 17:24   Data Reviewed for HPI: Relevant notes from primary care and specialist visits, past discharge summaries as available in EHR, including Care Everywhere. Prior diagnostic testing as pertinent to current admission diagnoses Updated medications and problem lists for reconciliation ED course, including vitals, labs, imaging, treatment and response to treatment Triage notes, nursing and pharmacy notes and ED provider's notes Notable results as noted above in HPI      Assessment and Plan: * Severe sepsis (HCC) Infected laceration right forearm History of severe penicillin allergy (anaphylaxis) Sepsis criteria include low-grade temp, tachycardia and tachypnea with hypotension, altered mental status, WBC 15,000, lactic acid 1.8 Continue meropenem  and vancomycin  Sepsis fluids monitoring for fluid overload in view of CHF history/rheumatic tricuspid regurgitation Follow-up blood cultures Wound care Pain control Can consider surgical consult-wound borders were loosely approximated in the ED   Hypotension History of hypotension requiring midodrine  BP 79/49 on arrival, improving to 101/56 with fluids Suspect secondary to sepsis, however has used midodrine  in the past Continue to monitor Continue sepsis fluids Midodrine  if needed to maintain maps over 65  Acute metabolic encephalopathy Suspect secondary to sepsis Neurologic checks with fall and aspiration precautions Delirium precautions Expecting improvement with IV fluids  (HFpEF) heart failure with preserved ejection fraction (HCC) History of rheumatic tricuspid regurgitation Clinically euvolemic to dry CT chest showing stable cardiomegaly, small pleural effusions, possible pulmonary edema Continue cautious IV fluid resuscitation with close monitoring for fluid overload Daily weights with intake and output monitoring  Longstanding persistent atrial fibrillation (HCC) History of  pulmonary embolism Chronic anticoagulation Continue apixaban  Not currently on rate control agents  Stage 3a chronic kidney disease (HCC) Renal function at most recent baseline Continue to monitor  Anemia, macrocytic Thrombocytopenia Previously evaluated by oncology increased LDH likely due to increased RBC turnover secondary to destruction from heart valve disease or presumed kidney cancer--- no further workup in view of advanced age Hemoglobin 11, down from 12.2 a couple weeks prior Platelets 104, down from 125 a couple weeks prior CT chest abdomen pelvis with contrast unrevealing for etiology Will get anemia panel Can consider hematology consult inpatient versus outpatient  Rheumatic mitral regurgitation No acute issues suspected  Renal cell carcinoma on annual CT surveillance [facility]) Stable findings on CT abdomen and pelvis Per oncology note 01/2023: In view of advanced age .SABRASABRApatient and his wife are not interested in any surgery or biopsy      DVT prophylaxis: Eliquis   Consults: none  Advance Care Planning:   Code Status: Prior   Family Communication: none  Disposition Plan: Back to previous home environment  Severity of Illness: The appropriate patient status for this patient is INPATIENT. Inpatient status is judged to be reasonable and necessary in order to provide the required intensity of service to ensure the patient's safety. The patient's presenting symptoms, physical exam findings, and initial radiographic and laboratory data in the context of their chronic comorbidities is felt to place them at high risk for further clinical deterioration. Furthermore, it is not anticipated that the patient will be medically stable for discharge from the hospital within 2 midnights of admission.   * I certify that at the point of admission it is my clinical judgment that the patient will require inpatient hospital care spanning beyond 2 midnights from the point of admission due  to high intensity of service, high risk for further deterioration and high frequency of surveillance required.*  Author: Delayne LULLA Solian, MD 04/29/2024 10:59 PM  For on call review [email or web address].

## 2024-04-29 NOTE — Assessment & Plan Note (Signed)
 Suspect secondary to sepsis Neurologic checks with fall and aspiration precautions Delirium precautions Expecting improvement with IV fluids

## 2024-04-29 NOTE — Assessment & Plan Note (Signed)
 History of pulmonary embolism Chronic anticoagulation Continue apixaban  Not currently on rate control agents

## 2024-04-29 NOTE — Assessment & Plan Note (Signed)
 No acute issues suspected

## 2024-04-29 NOTE — ED Triage Notes (Signed)
 Pt in via [facility] from home. Pt is altered at baseline but menatla status has declined over the past few days. EMS originally called out for brething issues. Pt stated he had fallen but per family he did not fall today but ahs had a few falls over the past few days. Denies hitting head. Pt is on Eliquis . Pt also has an infection to the right arm.

## 2024-04-29 NOTE — Assessment & Plan Note (Signed)
 History of hypotension requiring midodrine  BP 79/49 on arrival, improving to 101/56 with fluids Suspect secondary to sepsis, however has used midodrine  in the past Continue to monitor Continue sepsis fluids Midodrine  if needed to maintain maps over 65

## 2024-04-30 DIAGNOSIS — A419 Sepsis, unspecified organism: Secondary | ICD-10-CM | POA: Diagnosis not present

## 2024-04-30 DIAGNOSIS — R6520 Severe sepsis without septic shock: Secondary | ICD-10-CM | POA: Diagnosis not present

## 2024-04-30 LAB — COMPREHENSIVE METABOLIC PANEL WITH GFR
ALT: 14 U/L (ref 0–44)
AST: 28 U/L (ref 15–41)
Albumin: 2.7 g/dL — ABNORMAL LOW (ref 3.5–5.0)
Alkaline Phosphatase: 66 U/L (ref 38–126)
Anion gap: 10 (ref 5–15)
BUN: 26 mg/dL — ABNORMAL HIGH (ref 8–23)
CO2: 23 mmol/L (ref 22–32)
Calcium: 8.3 mg/dL — ABNORMAL LOW (ref 8.9–10.3)
Chloride: 102 mmol/L (ref 98–111)
Creatinine, Ser: 1.1 mg/dL (ref 0.61–1.24)
GFR, Estimated: >60 mL/min (ref >60)
Glucose, Bld: 90 mg/dL (ref 70–99)
Potassium: 3.6 mmol/L (ref 3.5–5.1)
Sodium: 135 mmol/L (ref 135–145)
Total Bilirubin: 3.7 mg/dL — ABNORMAL HIGH (ref 0.0–1.2)
Total Protein: 5.7 g/dL — ABNORMAL LOW (ref 6.5–8.1)

## 2024-04-30 LAB — CBC
HCT: 31.8 % — ABNORMAL LOW (ref 39.0–52.0)
Hemoglobin: 10.4 g/dL — ABNORMAL LOW (ref 13.0–17.0)
MCH: 36.1 pg — ABNORMAL HIGH (ref 26.0–34.0)
MCHC: 32.7 g/dL (ref 30.0–36.0)
MCV: 110.4 fL — ABNORMAL HIGH (ref 80.0–100.0)
Platelets: 93 K/uL — ABNORMAL LOW (ref 150–400)
RBC: 2.88 MIL/uL — ABNORMAL LOW (ref 4.22–5.81)
RDW: 16.4 % — ABNORMAL HIGH (ref 11.5–15.5)
WBC: 11.4 K/uL — ABNORMAL HIGH (ref 4.0–10.5)
nRBC: 0 % (ref 0.0–0.2)

## 2024-04-30 LAB — IRON AND TIBC
Iron: 32 ug/dL — ABNORMAL LOW (ref 45–182)
Saturation Ratios: 11 % — ABNORMAL LOW (ref 17.9–39.5)
TIBC: 291 ug/dL (ref 250–450)
UIBC: 259 ug/dL

## 2024-04-30 LAB — FOLATE: Folate: 6 ng/mL (ref >5.9)

## 2024-04-30 LAB — FERRITIN: Ferritin: 75 ng/mL (ref 24–336)

## 2024-04-30 LAB — VITAMIN B12: Vitamin B-12: 728 pg/mL (ref 180–914)

## 2024-04-30 MED ORDER — FERROUS SULFATE 325 (65 FE) MG PO TABS
325.0000 mg | ORAL_TABLET | Freq: Every day | ORAL | Status: DC
  Administered 2024-05-01 – 2024-05-03 (×3): 325 mg via ORAL
  Filled 2024-04-30 (×3): qty 1

## 2024-04-30 MED ORDER — ENSURE PLUS HIGH PROTEIN PO LIQD
237.0000 mL | Freq: Two times a day (BID) | ORAL | Status: DC
  Administered 2024-04-30 – 2024-05-02 (×6): 237 mL via ORAL

## 2024-04-30 MED ORDER — APIXABAN 2.5 MG PO TABS
2.5000 mg | ORAL_TABLET | Freq: Two times a day (BID) | ORAL | Status: DC
  Administered 2024-04-30 – 2024-05-03 (×6): 2.5 mg via ORAL
  Filled 2024-04-30 (×6): qty 1

## 2024-04-30 MED ORDER — SODIUM CHLORIDE 0.9 % IV SOLN
2.0000 g | INTRAVENOUS | Status: DC
  Administered 2024-04-30 – 2024-05-02 (×3): 2 g via INTRAVENOUS
  Filled 2024-04-30 (×4): qty 20

## 2024-04-30 MED ORDER — VANCOMYCIN HCL 750 MG/150ML IV SOLN
750.0000 mg | INTRAVENOUS | Status: DC
Start: 2024-04-30 — End: 2024-05-01
  Administered 2024-04-30: 750 mg via INTRAVENOUS
  Filled 2024-04-30 (×2): qty 150

## 2024-04-30 NOTE — Progress Notes (Signed)
 PROGRESS NOTE    Marcus Kemp  FMW:[id number] DOB: 1925-01-04 DOA: 04/29/2024 PCP: Auston Reyes [name], MD    Brief Narrative:  89 y.o. male with medical history significant for HFpEF, prior PE, persistent A-fib on Eliquis , pulmonary hypertension, renal cell carcinoma, rheumatic heart disease, CKD stage IIIa, hypotension with prior need for midodrine , who is being admitted with sepsis secondary to an infected laceration of the right forearm, which he sustained when he fell outdoors a couple days prior. Reportedly, patient has been having recent falls over the past few days resulting in a few falls.  When he cut his right forearm family washed it out with a garden hose.  Over the course of the last couple days however he has had a change in mentation.  EMS was initially called out for trouble shortness of breath. In the ED, Tmax 99 tachycardic from 93-109, tachypneic to the 20s with initial BP 79/49, improving to 101/56 with IV fluids.  O2 sat in the high 90s on room air.   Assessment & Plan:   Principal Problem:   Severe sepsis (HCC) Active Problems:   Infected laceration of skin, right forearm   Hypotension   (HFpEF) heart failure with preserved ejection fraction (HCC)   Acute metabolic encephalopathy   Longstanding persistent atrial fibrillation (HCC)   Chronic anticoagulation   History of pulmonary embolism   Stage 3a chronic kidney disease (HCC)   Anemia, macrocytic   Thrombocytopenia   Rheumatic mitral regurgitation   Renal cell carcinoma on annual CT surveillance (HCC)  Severe sepsis (HCC) Infected laceration right forearm History of severe penicillin allergy (anaphylaxis) Sepsis criteria include low-grade temp, tachycardia and tachypnea with hypotension, altered mental status, WBC 15,000, lactic acid 1.8 Wound was irrigated and sutured in emergency room by EDP Plan: Continue vancomycin  DC meropenem Initiate Rocephin  for gram-negative coverage Follow blood cultures,  no growth to date Wound care May control Defer surgical consultation for now   Hypotension History of hypotension requiring midodrine  BP 79/49 on arrival, improving to 101/56 with fluids Suspect secondary to sepsis, however has used midodrine  in the past Continue to monitor Continue sepsis fluids Midodrine  if needed to maintain maps over 65   Acute metabolic encephalopathy Suspect secondary to sepsis Patient with level of mentation unclear Neurologic checks with fall and aspiration precautions Delirium precautions   (HFpEF) heart failure with preserved ejection fraction (HCC) History of rheumatic tricuspid regurgitation Clinically euvolemic to dry CT chest showing stable cardiomegaly, small pleural effusions, possible pulmonary edema Continue cautious IV fluid resuscitation with close monitoring for fluid overload Daily weights with intake and output monitoring   Longstanding persistent atrial fibrillation (HCC) History of pulmonary embolism Chronic anticoagulation Continue apixaban  Not currently on rate control agents   Stage 3a chronic kidney disease (HCC) Renal function at most recent baseline Continue to monitor   Anemia, macrocytic Thrombocytopenia Iron deficiency anemia Previously evaluated by oncology increased LDH likely due to increased RBC turnover secondary to destruction from heart valve disease or presumed kidney cancer--- no further workup in view of advanced age Hemoglobin 11, down from 12.2 a couple weeks prior Platelets 104, down from 125 a couple weeks prior CT chest abdomen pelvis with contrast unrevealing for etiology Plan: P.o. iron replacement   Rheumatic mitral regurgitation No acute issues suspected   Renal cell carcinoma on annual CT surveillance (HCC) Stable findings on CT abdomen and pelvis Per oncology note 01/2023: In view of advanced age .SABRASABRApatient and his wife are not interested in any surgery  or biopsy     DVT prophylaxis:  eliquis  Code Status: DNR Family Communication: Disposition Plan: Status is: Inpatient Remains inpatient appropriate because: Sepsis secondary to right forearm wound.  Remains on IV antibiotics   Level of care: Telemetry Medical  Consultants:  None  Procedures:  None  Antimicrobials: Vancomycin  Ceftriaxone     Subjective: Seen and examined.  History limited by underlying hearing loss.  Patient appears frail but visibly no distress  Objective: Vitals:   04/29/24 2320 04/30/24 0355 04/30/24 0806 04/30/24 1106  BP: 108/62 114/73 92/61 104/61  Pulse: 91 95 81 96  Resp:   18 18  Temp: 97.8 F (36.6 C) 97.6 F (36.4 C) 98.2 F (36.8 C) 98.3 F (36.8 C)  TempSrc: Oral     SpO2: 94% 91% 93% 100%  Weight:      Height:        Intake/Output Summary (Last 24 hours) at 04/30/2024 1352 Last data filed at 04/30/2024 1300 Gross per 24 hour  Intake --  Output 200 ml  Net -200 ml   Filed Weights   04/29/24 1641  Weight: 64.7 kg    Examination:  General exam: Frail-appearing Respiratory system: Clear.  Normal work of breathing.  Room air Cardiovascular system: S1-S2, RRR, no murmurs, no pedal edema Gastrointestinal system: Soft/ND, normal bowel sounds Central nervous system: Alert.  Oriented x 2.  No focal deficits Extremities: Symmetric 5 x 5 power. Skin: Right arm wound status post laceration repair Psychiatry: Judgement and insight appear normal. Mood & affect appropriate.     Data Reviewed: I have personally reviewed following labs and imaging studies  CBC: Recent Labs  Lab 04/29/24 1652 04/30/24 0553  WBC 15.5* 11.4*  NEUTROABS 13.1*  --   HGB 11.0* 10.4*  HCT 32.9* 31.8*  MCV 107.9* 110.4*  PLT 104* 93*   Basic Metabolic Panel: Recent Labs  Lab 04/29/24 1652 04/30/24 0553  NA 135 135  K 4.9 3.6  CL 99 102  CO2 24 23  GLUCOSE 103* 90  BUN 29* 26*  CREATININE 1.33* 1.10  CALCIUM 8.4* 8.3*   GFR: Estimated Creatinine Clearance: 34.2 mL/min (by  C-G formula based on SCr of 1.1 mg/dL). Liver Function Tests: Recent Labs  Lab 04/29/24 1652 04/30/24 0553  AST 40 28  ALT 16 14  ALKPHOS 80 66  BILITOT 3.2* 3.7*  PROT 6.2* 5.7*  ALBUMIN 2.8* 2.7*   No results for input(s): LIPASE, AMYLASE in the last 168 hours. No results for input(s): AMMONIA in the last 168 hours. Coagulation Profile: Recent Labs  Lab 04/29/24 1652  INR 1.7*   Cardiac Enzymes: No results for input(s): CKTOTAL, CKMB, CKMBINDEX, TROPONINI in the last 168 hours. BNP (last 3 results) No results for input(s): PROBNP in the last 8760 hours. HbA1C: No results for input(s): HGBA1C in the last 72 hours. CBG: No results for input(s): GLUCAP in the last 168 hours. Lipid Profile: No results for input(s): CHOL, HDL, LDLCALC, TRIG, CHOLHDL, LDLDIRECT in the last 72 hours. Thyroid Function Tests: No results for input(s): TSH, T4TOTAL, FREET4, T3FREE, THYROIDAB in the last 72 hours. Anemia Panel: Recent Labs    04/29/24 1645 04/30/24 0034 04/30/24 0553  VITAMINB12  --   --  728  FOLATE  --  6.0  --   FERRITIN  --  75  --   TIBC  --  291  --   IRON  --  32*  --   RETICCTPCT 3.5*  --   --  Sepsis Labs: Recent Labs  Lab 04/29/24 1652 04/29/24 1902  LATICACIDVEN 3.0* 1.8    Recent Results (from the past 240 hours)  Culture, blood (Routine x 2)     Status: None (Preliminary result)   Collection Time: 04/29/24  4:52 PM   Specimen: BLOOD LEFT ARM  Result Value Ref Range Status   Specimen Description BLOOD LEFT ARM  Final   Special Requests   Final    BOTTLES DRAWN AEROBIC AND ANAEROBIC Blood Culture results may not be optimal due to an inadequate volume of blood received in culture bottles   Culture   Final    NO GROWTH < 12 HOURS Performed at [facility], [address] [place], [place] [address]    Report Status PENDING  Incomplete  Culture, blood (Routine x 2)     Status: None (Preliminary  result)   Collection Time: 04/29/24  4:52 PM   Specimen: BLOOD  Result Value Ref Range Status   Specimen Description BLOOD LEFT ANTECUBITAL  Final   Special Requests   Final    BOTTLES DRAWN AEROBIC AND ANAEROBIC Blood Culture results may not be optimal due to an inadequate volume of blood received in culture bottles   Culture   Final    NO GROWTH < 12 HOURS Performed at [facility], [address] [place], [place] [address]    Report Status PENDING  Incomplete  Resp panel by RT-PCR (RSV, Flu A&B, Covid) Anterior Nasal Swab     Status: None   Collection Time: 04/29/24  9:09 PM   Specimen: Anterior Nasal Swab  Result Value Ref Range Status   SARS Coronavirus 2 by RT PCR NEGATIVE NEGATIVE Final    Comment: (NOTE) SARS-CoV-2 target nucleic acids are NOT DETECTED.  The SARS-CoV-2 RNA is generally detectable in upper respiratory specimens during the acute phase of infection. The lowest concentration of SARS-CoV-2 viral copies this assay can detect is 138 copies/mL. A negative result does not preclude SARS-Cov-2 infection and should not be used as the sole basis for treatment or other patient management decisions. A negative result may occur with  improper specimen collection/handling, submission of specimen other than nasopharyngeal swab, presence of viral mutation(s) within the areas targeted by this assay, and inadequate number of viral copies(<138 copies/mL). A negative result must be combined with clinical observations, patient history, and epidemiological information. The expected result is Negative.  Fact Sheet for Patients:  BloggerCourse.com  Fact Sheet for Healthcare Providers:  SeriousBroker.it  This test is no t yet approved or cleared by the [place]  FDA and  has been authorized for detection and/or diagnosis of SARS-CoV-2 by FDA under an Emergency Use Authorization (EUA). This EUA will remain  in  effect (meaning this test can be used) for the duration of the COVID-19 declaration under Section 564(b)(1) of the Act, 21 U.S.C.section 360bbb-3(b)(1), unless the authorization is terminated  or revoked sooner.       Influenza A by PCR NEGATIVE NEGATIVE Final   Influenza B by PCR NEGATIVE NEGATIVE Final    Comment: (NOTE) The Xpert Xpress SARS-CoV-2/FLU/RSV plus assay is intended as an aid in the diagnosis of influenza from Nasopharyngeal swab specimens and should not be used as a sole basis for treatment. Nasal washings and aspirates are unacceptable for Xpert Xpress SARS-CoV-2/FLU/RSV testing.  Fact Sheet for Patients: BloggerCourse.com  Fact Sheet for Healthcare Providers: SeriousBroker.it  This test is not yet approved or cleared by the [place]  FDA and has been  authorized for detection and/or diagnosis of SARS-CoV-2 by FDA under an Emergency Use Authorization (EUA). This EUA will remain in effect (meaning this test can be used) for the duration of the COVID-19 declaration under Section 564(b)(1) of the Act, 21 U.S.C. section 360bbb-3(b)(1), unless the authorization is terminated or revoked.     Resp Syncytial Virus by PCR NEGATIVE NEGATIVE Final    Comment: (NOTE) Fact Sheet for Patients: BloggerCourse.com  Fact Sheet for Healthcare Providers: SeriousBroker.it  This test is not yet approved or cleared by the [place]  FDA and has been authorized for detection and/or diagnosis of SARS-CoV-2 by FDA under an Emergency Use Authorization (EUA). This EUA will remain in effect (meaning this test can be used) for the duration of the COVID-19 declaration under Section 564(b)(1) of the Act, 21 U.S.C. section 360bbb-3(b)(1), unless the authorization is terminated or revoked.  Performed at [facility], [address] [place], [place] [address]           Radiology Studies: CT CHEST ABDOMEN PELVIS W CONTRAST Result Date: 04/29/2024 CLINICAL DATA:  Sepsis, left renal cell carcinoma * Tracking Code: BO * EXAM: CT CHEST, ABDOMEN, AND PELVIS WITH CONTRAST TECHNIQUE: Multidetector CT imaging of the chest, abdomen and pelvis was performed following the standard protocol during bolus administration of intravenous contrast. RADIATION DOSE REDUCTION: This exam was performed according to the departmental dose-optimization program which includes automated exposure control, adjustment of the mA and/or kV according to patient size and/or use of iterative reconstruction technique. CONTRAST:  OMNIPAQUE  IOHEXOL  300 MG/ML  SOLN COMPARISON:  CT abdomen pelvis, 12/30/2022 FINDINGS: CT CHEST FINDINGS Cardiovascular: Aortic atherosclerosis. Cardiomegaly. Three-vessel coronary artery calcifications. Enlargement of the main pulmonary artery measuring up to 3.7 cm in caliber. No pericardial effusion. Mediastinum/Nodes: No enlarged mediastinal, hilar, or axillary lymph nodes. Thyroid gland, trachea, and esophagus demonstrate no significant findings. Lungs/Pleura: Small bilateral pleural effusions and associated atelectasis or consolidation. Interlobular septal thickening throughout consistent with pulmonary edema. Musculoskeletal: No chest wall abnormality. No acute osseous findings. CT ABDOMEN PELVIS FINDINGS Hepatobiliary: No solid liver abnormality is seen. Mild gallbladder wall thickening. No visible gallstones. Intra and extrahepatic biliary ductal dilatation, common bile duct measuring up to 0.9 cm in caliber and appearing to taper to the ampulla without calculus or other obstruction visible. Appearance is similar to prior examination dated 12/30/2022. Pancreas: Unremarkable. No pancreatic ductal dilatation or surrounding inflammatory changes. Spleen: Normal in size without significant abnormality. Adrenals/Urinary Tract: Adrenal glands are unremarkable. Unchanged  heterogeneously enhancing mass arising from the posterior midportion of the left kidney measuring 2.1 x 1.8 cm (series 2, image 62). Nonobstructive right renal calculi. No ureteral calculi or hydronephrosis. Bladder is unremarkable. Stomach/Bowel: Stomach is within normal limits. Appendix appears normal. No evidence of bowel wall thickening, distention, or inflammatory changes. Sigmoid diverticulosis. Vascular/Lymphatic: Severe aortic atherosclerosis. No enlarged abdominal or pelvic lymph nodes. Reproductive: No mass or other abnormality. Other: No abdominal wall hernia or abnormality. No ascites. Musculoskeletal: No acute osseous findings. Disc degenerative disease and bridging osteophytosis throughout the thoracic and lumbar spine, in keeping with DISH. IMPRESSION: 1. Small bilateral pleural effusions and associated atelectasis or consolidation. Interlobular septal thickening throughout consistent with pulmonary edema. 2. Cardiomegaly and coronary artery disease. Enlargement of the main pulmonary artery, as can be seen in pulmonary hypertension. 3. Mild gallbladder wall thickening. No visible gallstones. Intra and extrahepatic biliary ductal dilatation, common bile duct measuring up to 0.9 cm in caliber and appearing to taper to the ampulla without calculus or  other obstruction visible. Appearance is similar to prior examination dated 12/30/2022. Correlate with biochemical findings and consider ultrasound or MRCP to further evaluate if indicated by clinical concern for cholecystitis. 4. Unchanged heterogeneously enhancing mass arising from the posterior midportion of the left kidney measuring 2.1 x 1.8 cm, consistent with renal cell carcinoma. 5. No evidence of lymphadenopathy or metastatic disease in the chest, abdomen, or pelvis. Particularly, a previously seen enlarged, hypodense aortocaval lymph node is resolved on today's examination. 6. Nonobstructive right nephrolithiasis.  No hydronephrosis. Aortic  Atherosclerosis (ICD10-I70.0). Electronically Signed   By: Marolyn Marcus Jaksch M.D.   On: 04/29/2024 20:44   DG Forearm Right Result Date: 04/29/2024 CLINICAL DATA:  right upper arm pain and right forearm pain after fall EXAM: RIGHT FOREARM - 2 VIEW COMPARISON:  None Available. FINDINGS: Diffuse osteopenia.No acute fracture or dislocation. There is no evidence of arthropathy or other focal bone abnormality. Gauze material overlying the proximal aspect of the dorsal forearm. IMPRESSION: Diffuse osteopenia. No acute fracture or malalignment of the radius and ulna. Electronically Signed   By: Rogelia Myers M.D.   On: 04/29/2024 17:25   DG Humerus Right Result Date: 04/29/2024 CLINICAL DATA:  right upper arm pain and right forearm pain after fall EXAM: RIGHT HUMERUS - 2+ VIEW COMPARISON:  None Available. FINDINGS: Osteopenia.No acute fracture or dislocation. There is no evidence of arthropathy or other focal bone abnormality. Soft tissues are unremarkable. IMPRESSION: Diffuse osteopenia.  No acute fracture or dislocation. Electronically Signed   By: Rogelia Myers M.D.   On: 04/29/2024 17:24        Scheduled Meds:  apixaban   2.5 mg Oral BID   empagliflozin   10 mg Oral Daily   feeding supplement  237 mL Oral BID BM   midodrine   5 mg Oral BID WC   traZODone   100 mg Oral QHS   Continuous Infusions:  cefTRIAXone  (ROCEPHIN )  IV     lactated ringers  150 mL/hr (04/30/24 1350)   vancomycin        LOS: 1 day      Marcus KATHEE Robson, MD [facility]   If 7PM-7AM, please contact night-coverage  04/30/2024, 1:52 PM

## 2024-05-01 DIAGNOSIS — A419 Sepsis, unspecified organism: Secondary | ICD-10-CM | POA: Diagnosis not present

## 2024-05-01 DIAGNOSIS — R6520 Severe sepsis without septic shock: Secondary | ICD-10-CM | POA: Diagnosis not present

## 2024-05-01 LAB — CBC WITH DIFFERENTIAL/PLATELET
Abs Immature Granulocytes: 0.05 K/uL (ref 0.00–0.07)
Basophils Absolute: 0.1 K/uL (ref 0.0–0.1)
Basophils Relative: 1 %
Eosinophils Absolute: 0.1 K/uL (ref 0.0–0.5)
Eosinophils Relative: 1 %
HCT: 32.8 % — ABNORMAL LOW (ref 39.0–52.0)
Hemoglobin: 10.6 g/dL — ABNORMAL LOW (ref 13.0–17.0)
Immature Granulocytes: 1 %
Lymphocytes Relative: 19 %
Lymphs Abs: 1.7 K/uL (ref 0.7–4.0)
MCH: 35.5 pg — ABNORMAL HIGH (ref 26.0–34.0)
MCHC: 32.3 g/dL (ref 30.0–36.0)
MCV: 109.7 fL — ABNORMAL HIGH (ref 80.0–100.0)
Monocytes Absolute: 0.9 K/uL (ref 0.1–1.0)
Monocytes Relative: 10 %
Neutro Abs: 6.1 K/uL (ref 1.7–7.7)
Neutrophils Relative %: 68 %
Platelets: 98 K/uL — ABNORMAL LOW (ref 150–400)
RBC: 2.99 MIL/uL — ABNORMAL LOW (ref 4.22–5.81)
RDW: 16.4 % — ABNORMAL HIGH (ref 11.5–15.5)
WBC: 8.9 K/uL (ref 4.0–10.5)
nRBC: 0.3 % — ABNORMAL HIGH (ref 0.0–0.2)

## 2024-05-01 LAB — BASIC METABOLIC PANEL WITH GFR
Anion gap: 14 (ref 5–15)
BUN: 26 mg/dL — ABNORMAL HIGH (ref 8–23)
CO2: 22 mmol/L (ref 22–32)
Calcium: 8.7 mg/dL — ABNORMAL LOW (ref 8.9–10.3)
Chloride: 102 mmol/L (ref 98–111)
Creatinine, Ser: 1.04 mg/dL (ref 0.61–1.24)
GFR, Estimated: >60 mL/min (ref >60)
Glucose, Bld: 97 mg/dL (ref 70–99)
Potassium: 4.1 mmol/L (ref 3.5–5.1)
Sodium: 138 mmol/L (ref 135–145)

## 2024-05-01 LAB — CREATININE, SERUM
Creatinine, Ser: 1.05 mg/dL (ref 0.61–1.24)
GFR, Estimated: >60 mL/min (ref >60)

## 2024-05-01 NOTE — Plan of Care (Signed)
  Problem: Nutrition: Goal: Adequate nutrition will be maintained Outcome: Progressing   Problem: Coping: Goal: Level of anxiety will decrease Outcome: Progressing   Problem: Safety: Goal: Ability to remain free from injury will improve Outcome: Progressing   Problem: Skin Integrity: Goal: Risk for impaired skin integrity will decrease Outcome: Progressing   Problem: Respiratory: Goal: Ability to maintain adequate ventilation will improve Outcome: Progressing

## 2024-05-01 NOTE — Plan of Care (Signed)
  Problem: Education: Goal: Knowledge of General Education information will improve Description: Including pain rating scale, medication(s)/side effects and non-pharmacologic comfort measures Outcome: Not Progressing   Problem: Health Behavior/Discharge Planning: Goal: Ability to manage health-related needs will improve Outcome: Not Progressing   Problem: Clinical Measurements: Goal: Ability to maintain clinical measurements within normal limits will improve Outcome: Not Progressing Goal: Will remain free from infection Outcome: Not Progressing Goal: Diagnostic test results will improve Outcome: Not Progressing Goal: Respiratory complications will improve Outcome: Not Progressing Goal: Cardiovascular complication will be avoided Outcome: Not Progressing   Problem: Activity: Goal: Risk for activity intolerance will decrease Outcome: Not Progressing   Problem: Nutrition: Goal: Adequate nutrition will be maintained Outcome: Not Progressing   Problem: Coping: Goal: Level of anxiety will decrease Outcome: Not Progressing   Problem: Elimination: Goal: Will not experience complications related to bowel motility Outcome: Not Progressing Goal: Will not experience complications related to urinary retention Outcome: Not Progressing   Problem: Pain Managment: Goal: General experience of comfort will improve and/or be controlled Outcome: Not Progressing   Problem: Safety: Goal: Ability to remain free from injury will improve Outcome: Not Progressing   Problem: Skin Integrity: Goal: Risk for impaired skin integrity will decrease Outcome: Not Progressing   Problem: Fluid Volume: Goal: Hemodynamic stability will improve Outcome: Not Progressing   Problem: Clinical Measurements: Goal: Diagnostic test results will improve Outcome: Not Progressing Goal: Signs and symptoms of infection will decrease Outcome: Not Progressing   Problem: Respiratory: Goal: Ability to maintain  adequate ventilation will improve Outcome: Not Progressing

## 2024-05-01 NOTE — TOC Initial Note (Signed)
 Transition of Care [facility]) - Initial/Assessment Note    Patient Details  Name: Marcus Kemp MRN: [id number] Date of Birth: [birth year]  [facility]) CM/SW Contact:    Racheal LITTIE Schimke, RN Phone Number: 05/01/2024, 2:34 PM  Clinical Narrative: Lives with wife and Son in [facility]. Wife assist with ADL's and provide transport to medical visits. Use RW and Cane for ambulation. If SNF required [facility] is preference. Assessment done with wife at bedside, patient slept throughout assessment.                        Patient Goals and CMS Choice            Expected Discharge Plan and Services                                              Prior Living Arrangements/Services                       Activities of Daily Living      Permission Sought/Granted                  Emotional Assessment              Admission diagnosis:  Severe sepsis (HCC) [A41.9, R65.20] Sepsis, due to unspecified organism, unspecified whether acute organ dysfunction present [facility]) [A41.9] Patient Active Problem List   Diagnosis Date Noted   Severe sepsis (HCC) 04/29/2024   Infected laceration of skin, right forearm 04/29/2024   History of pulmonary embolism 04/29/2024   Chronic anticoagulation 04/29/2024   (HFpEF) heart failure with preserved ejection fraction (HCC) 04/29/2024   Stage 3a chronic kidney disease (HCC) 04/29/2024   Acute metabolic encephalopathy 04/29/2024   Renal cell carcinoma on annual CT surveillance (HCC)    Kidney lesion 01/03/2023   Hypokalemia 12/01/2022   Acute on chronic heart failure with preserved ejection fraction (HFpEF) (HCC) 11/30/2022   Thrombocytopenia 11/30/2022   Hematuria 11/30/2022   AKI (acute kidney injury) 11/30/2022   Hypotension 11/30/2022   Pulmonary embolism (HCC) 09/10/2022   CHF (congestive heart failure) (HCC) 09/09/2022   Acute respiratory failure with hypoxia (HCC) 09/09/2022   Anemia, macrocytic  01/27/2022   Hyperlipidemia 01/27/2022   MVP (mitral valve prolapse) 01/27/2022   Nephrolithiasis 01/27/2022   Rheumatic fever 01/27/2022   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   Anasarca 01/27/2022   Macrocytic anemia 01/27/2022   Rheumatic tricuspid valve regurgitation 09/19/2017   Status post catheter ablation of atrial flutter 07/13/2017   Jugular venous distension 07/12/2017   Peripheral edema 01/31/2017   Acute on chronic diastolic CHF (congestive heart failure) (HCC) 09/24/2016   Longstanding persistent atrial fibrillation (HCC) 05/11/2016   Epidural abscess 04/25/2016   Muscle abscess 04/25/2016   Osteomyelitis of cervical spine (HCC) 04/25/2016   Staphylococcus aureus bacteremia with sepsis (HCC) 04/25/2016   Sepsis (HCC) 04/22/2016   Community acquired pneumonia 04/22/2016   PCP:  Auston Reyes [name], MD Pharmacy:   [id number] - [place], [place] - [address] [place] [address] Phone: [phone] Fax: [phone]  [facility] [address] [place] [address] Phone: [phone] Fax: [phone]     Social Drivers of Health (SDOH) Social History: SDOH Screenings   Food Insecurity: Patient Unable To Answer (  04/30/2024)  Housing: Low Risk  (04/30/2024)  Transportation Needs: Patient Unable To Answer (04/30/2024)  Utilities: Not At Risk (04/29/2024)  Financial Resource Strain: Low Risk  (12/21/2023)   Received from [facility]  Social Connections: Unknown (04/30/2024)  Tobacco Use: Medium Risk (04/29/2024)   SDOH Interventions:     Readmission Risk Interventions     No data to display

## 2024-05-01 NOTE — Progress Notes (Signed)
 PROGRESS NOTE    DUGLAS Kemp  FMW:[id number] DOB: February 27, 1925 DOA: 04/29/2024 PCP: Marcus Kemp [name], MD    Brief Narrative:  89 y.o. male with medical history significant for HFpEF, prior PE, persistent A-fib on Eliquis , pulmonary hypertension, renal cell carcinoma, rheumatic heart disease, CKD stage IIIa, hypotension with prior need for midodrine , who is being admitted with sepsis secondary to an infected laceration of the right forearm, which he sustained when he fell outdoors a couple days prior. Reportedly, patient has been having recent falls over the past few days resulting in a few falls.  When he cut his right forearm family washed it out with a garden hose.  Over the course of the last couple days however he has had a change in mentation.  EMS was initially called out for trouble shortness of breath. In the ED, Tmax 99 tachycardic from 93-109, tachypneic to the 20s with initial BP 79/49, improving to 101/56 with IV fluids.  O2 sat in the high 90s on room air.   Assessment & Plan:   Principal Problem:   Severe sepsis (HCC) Active Problems:   Infected laceration of skin, right forearm   Hypotension   (HFpEF) heart failure with preserved ejection fraction (HCC)   Acute metabolic encephalopathy   Longstanding persistent atrial fibrillation (HCC)   Chronic anticoagulation   History of pulmonary embolism   Stage 3a chronic kidney disease (HCC)   Anemia, macrocytic   Thrombocytopenia   Rheumatic mitral regurgitation   Renal cell carcinoma on annual CT surveillance (HCC)  Severe sepsis (HCC) Infected laceration right forearm History of severe penicillin allergy (anaphylaxis) Sepsis criteria include low-grade temp, tachycardia and tachypnea with hypotension, altered mental status, WBC 15,000, lactic acid 1.8 Wound was irrigated and sutured in emergency room by EDP Plan: No evidence of MRSA.  Nonpurulent infection.  DC vancomycin .  Continue Rocephin  for appropriate  gram-negative and non-MRSA gram-positive coverage.  Follow blood cultures, no growth to date.  Continue local wound care.  Pain control as needed.  No indication for surgical consultation.  Consider de-escalation to oral antibiotics in the next 1 to 2 days.  Hypotension History of hypotension requiring midodrine  BP 79/49 on arrival, improving to 101/56 with fluids Suspect secondary to sepsis, however has used midodrine  in the past Continue to monitor Midodrine  if needed to maintain maps over 65 Blood pressure stabilized   Acute metabolic encephalopathy Suspect secondary to sepsis Patient with level of mentation unclear Neurologic checks with fall and aspiration precautions Delirium precautions Mental status improved   (HFpEF) heart failure with preserved ejection fraction (HCC) History of rheumatic tricuspid regurgitation Clinically euvolemic to dry CT chest showing stable cardiomegaly, small pleural effusions, possible pulmonary edema  no further IV fluids indicated   Longstanding persistent atrial fibrillation (HCC) History of pulmonary embolism Chronic anticoagulation Continue apixaban  Not currently on rate control agents   Stage 3a chronic kidney disease (HCC) Renal function at most recent baseline Continue to monitor   Anemia, macrocytic Thrombocytopenia Iron deficiency anemia Previously evaluated by oncology increased LDH likely due to increased RBC turnover secondary to destruction from heart valve disease or presumed kidney cancer--- no further workup in view of advanced age Hemoglobin 11, down from 12.2 a couple weeks prior Platelets 104, down from 125 a couple weeks prior CT chest abdomen pelvis with contrast unrevealing for etiology Plan: P.o. iron replacement   Rheumatic mitral regurgitation No acute issues suspected   Renal cell carcinoma on annual CT surveillance (HCC) Stable findings on CT  abdomen and pelvis Per oncology note 01/2023: In view of  advanced age .SABRASABRApatient and his wife are not interested in any surgery or biopsy     DVT prophylaxis: eliquis  Code Status: DNR Family Communication: Spouse at bedside 10/7.  Daughter via phone 10/7 Disposition Plan: Status is: Inpatient Remains inpatient appropriate because: Sepsis secondary to right forearm wound.  Remains on IV antibiotics   Level of care: Telemetry Medical  Consultants:  None  Procedures:  None  Antimicrobials: Ceftriaxone     Subjective: Seen and examined.  Wife at bedside.  Patient visibly in no distress.  Objective: Vitals:   04/30/24 2020 05/01/24 0621 05/01/24 0642 05/01/24 0802  BP: 110/63 118/66 105/69 97/75  Pulse: (!) 102 (!) 102 86 93  Resp: 20 18 18 16   Temp: 98.6 F (37 C) 98.5 F (36.9 C) 97.8 F (36.6 C) 97.7 F (36.5 C)  TempSrc:  Oral Oral   SpO2: 93% (!) 89% 93% 98%  Weight:      Height:        Intake/Output Summary (Last 24 hours) at 05/01/2024 1501 Last data filed at 05/01/2024 0900 Gross per 24 hour  Intake 2824.33 ml  Output 425 ml  Net 2399.33 ml   Filed Weights   04/29/24 1641  Weight: 64.7 kg    Examination:  General exam: Appears frail Respiratory system: Clear.  Normal work of breathing.  Room air Cardiovascular system: S1-S2, RRR, no murmurs, no pedal edema Gastrointestinal system: Soft/ND, normal bowel sounds Central nervous system: Alert.  Oriented x 2.  No focal deficits Extremities: Symmetric 5 x 5 power. Skin: Right arm wound status post laceration repair Psychiatry: Judgement and insight appear normal. Mood & affect appropriate.     Data Reviewed: I have personally reviewed following labs and imaging studies  CBC: Recent Labs  Lab 04/29/24 1652 04/30/24 0553 05/01/24 0858  WBC 15.5* 11.4* 8.9  NEUTROABS 13.1*  --  6.1  HGB 11.0* 10.4* 10.6*  HCT 32.9* 31.8* 32.8*  MCV 107.9* 110.4* 109.7*  PLT 104* 93* 98*   Basic Metabolic Panel: Recent Labs  Lab 04/29/24 1652 04/30/24 0553  05/01/24 0550 05/01/24 0858  NA 135 135  --  138  K 4.9 3.6  --  4.1  CL 99 102  --  102  CO2 24 23  --  22  GLUCOSE 103* 90  --  97  BUN 29* 26*  --  26*  CREATININE 1.33* 1.10 1.05 1.04  CALCIUM 8.4* 8.3*  --  8.7*   GFR: Estimated Creatinine Clearance: 36.1 mL/min (by C-G formula based on SCr of 1.04 mg/dL). Liver Function Tests: Recent Labs  Lab 04/29/24 1652 04/30/24 0553  AST 40 28  ALT 16 14  ALKPHOS 80 66  BILITOT 3.2* 3.7*  PROT 6.2* 5.7*  ALBUMIN 2.8* 2.7*   No results for input(s): LIPASE, AMYLASE in the last 168 hours. No results for input(s): AMMONIA in the last 168 hours. Coagulation Profile: Recent Labs  Lab 04/29/24 1652  INR 1.7*   Cardiac Enzymes: No results for input(s): CKTOTAL, CKMB, CKMBINDEX, TROPONINI in the last 168 hours. BNP (last 3 results) No results for input(s): PROBNP in the last 8760 hours. HbA1C: No results for input(s): HGBA1C in the last 72 hours. CBG: No results for input(s): GLUCAP in the last 168 hours. Lipid Profile: No results for input(s): CHOL, HDL, LDLCALC, TRIG, CHOLHDL, LDLDIRECT in the last 72 hours. Thyroid Function Tests: No results for input(s): TSH, T4TOTAL, FREET4, T3FREE, THYROIDAB  in the last 72 hours. Anemia Panel: Recent Labs    04/29/24 1645 04/30/24 0034 04/30/24 0553  VITAMINB12  --   --  728  FOLATE  --  6.0  --   FERRITIN  --  75  --   TIBC  --  291  --   IRON  --  32*  --   RETICCTPCT 3.5*  --   --    Sepsis Labs: Recent Labs  Lab 04/29/24 1652 04/29/24 1902  LATICACIDVEN 3.0* 1.8    Recent Results (from the past 240 hours)  Culture, blood (Routine x 2)     Status: None (Preliminary result)   Collection Time: 04/29/24  4:52 PM   Specimen: BLOOD LEFT ARM  Result Value Ref Range Status   Specimen Description BLOOD LEFT ARM  Final   Special Requests   Final    BOTTLES DRAWN AEROBIC AND ANAEROBIC Blood Culture results may not be optimal due to  an inadequate volume of blood received in culture bottles   Culture   Final    NO GROWTH 2 DAYS Performed at [facility], [address] [place], [place] [address]    Report Status PENDING  Incomplete  Culture, blood (Routine x 2)     Status: None (Preliminary result)   Collection Time: 04/29/24  4:52 PM   Specimen: BLOOD  Result Value Ref Range Status   Specimen Description BLOOD LEFT ANTECUBITAL  Final   Special Requests   Final    BOTTLES DRAWN AEROBIC AND ANAEROBIC Blood Culture results may not be optimal due to an inadequate volume of blood received in culture bottles   Culture   Final    NO GROWTH 2 DAYS Performed at [facility], [address], [place], [place] [address]    Report Status PENDING  Incomplete  Resp panel by RT-PCR (RSV, Flu A&B, Covid) Anterior Nasal Swab     Status: None   Collection Time: 04/29/24  9:09 PM   Specimen: Anterior Nasal Swab  Result Value Ref Range Status   SARS Coronavirus 2 by RT PCR NEGATIVE NEGATIVE Final    Comment: (NOTE) SARS-CoV-2 target nucleic acids are NOT DETECTED.  The SARS-CoV-2 RNA is generally detectable in upper respiratory specimens during the acute phase of infection. The lowest concentration of SARS-CoV-2 viral copies this assay can detect is 138 copies/mL. A negative result does not preclude SARS-Cov-2 infection and should not be used as the sole basis for treatment or other patient management decisions. A negative result may occur with  improper specimen collection/handling, submission of specimen other than nasopharyngeal swab, presence of viral mutation(s) within the areas targeted by this assay, and inadequate number of viral copies(<138 copies/mL). A negative result must be combined with clinical observations, patient history, and epidemiological information. The expected result is Negative.  Fact Sheet for Patients:  BloggerCourse.com  Fact Sheet for Healthcare  Providers:  SeriousBroker.it  This test is no t yet approved or cleared by the [place]  FDA and  has been authorized for detection and/or diagnosis of SARS-CoV-2 by FDA under an Emergency Use Authorization (EUA). This EUA will remain  in effect (meaning this test can be used) for the duration of the COVID-19 declaration under Section 564(b)(1) of the Act, 21 U.S.C.section 360bbb-3(b)(1), unless the authorization is terminated  or revoked sooner.       Influenza A by PCR NEGATIVE NEGATIVE Final   Influenza B by PCR NEGATIVE NEGATIVE Final    Comment: (NOTE)  The Xpert Xpress SARS-CoV-2/FLU/RSV plus assay is intended as an aid in the diagnosis of influenza from Nasopharyngeal swab specimens and should not be used as a sole basis for treatment. Nasal washings and aspirates are unacceptable for Xpert Xpress SARS-CoV-2/FLU/RSV testing.  Fact Sheet for Patients: BloggerCourse.com  Fact Sheet for Healthcare Providers: SeriousBroker.it  This test is not yet approved or cleared by the [place]  FDA and has been authorized for detection and/or diagnosis of SARS-CoV-2 by FDA under an Emergency Use Authorization (EUA). This EUA will remain in effect (meaning this test can be used) for the duration of the COVID-19 declaration under Section 564(b)(1) of the Act, 21 U.S.C. section 360bbb-3(b)(1), unless the authorization is terminated or revoked.     Resp Syncytial Virus by PCR NEGATIVE NEGATIVE Final    Comment: (NOTE) Fact Sheet for Patients: BloggerCourse.com  Fact Sheet for Healthcare Providers: SeriousBroker.it  This test is not yet approved or cleared by the [place]  FDA and has been authorized for detection and/or diagnosis of SARS-CoV-2 by FDA under an Emergency Use Authorization (EUA). This EUA will remain in effect (meaning this test can be  used) for the duration of the COVID-19 declaration under Section 564(b)(1) of the Act, 21 U.S.C. section 360bbb-3(b)(1), unless the authorization is terminated or revoked.  Performed at [facility], [address] [place], [place] [address]          Radiology Studies: CT CHEST ABDOMEN PELVIS W CONTRAST Result Date: 04/29/2024 CLINICAL DATA:  Sepsis, left renal cell carcinoma * Tracking Code: BO * EXAM: CT CHEST, ABDOMEN, AND PELVIS WITH CONTRAST TECHNIQUE: Multidetector CT imaging of the chest, abdomen and pelvis was performed following the standard protocol during bolus administration of intravenous contrast. RADIATION DOSE REDUCTION: This exam was performed according to the departmental dose-optimization program which includes automated exposure control, adjustment of the mA and/or kV according to patient size and/or use of iterative reconstruction technique. CONTRAST:  OMNIPAQUE  IOHEXOL  300 MG/ML  SOLN COMPARISON:  CT abdomen pelvis, 12/30/2022 FINDINGS: CT CHEST FINDINGS Cardiovascular: Aortic atherosclerosis. Cardiomegaly. Three-vessel coronary artery calcifications. Enlargement of the main pulmonary artery measuring up to 3.7 cm in caliber. No pericardial effusion. Mediastinum/Nodes: No enlarged mediastinal, hilar, or axillary lymph nodes. Thyroid gland, trachea, and esophagus demonstrate no significant findings. Lungs/Pleura: Small bilateral pleural effusions and associated atelectasis or consolidation. Interlobular septal thickening throughout consistent with pulmonary edema. Musculoskeletal: No chest wall abnormality. No acute osseous findings. CT ABDOMEN PELVIS FINDINGS Hepatobiliary: No solid liver abnormality is seen. Mild gallbladder wall thickening. No visible gallstones. Intra and extrahepatic biliary ductal dilatation, common bile duct measuring up to 0.9 cm in caliber and appearing to taper to the ampulla without calculus or other obstruction visible. Appearance is  similar to prior examination dated 12/30/2022. Pancreas: Unremarkable. No pancreatic ductal dilatation or surrounding inflammatory changes. Spleen: Normal in size without significant abnormality. Adrenals/Urinary Tract: Adrenal glands are unremarkable. Unchanged heterogeneously enhancing mass arising from the posterior midportion of the left kidney measuring 2.1 x 1.8 cm (series 2, image 62). Nonobstructive right renal calculi. No ureteral calculi or hydronephrosis. Bladder is unremarkable. Stomach/Bowel: Stomach is within normal limits. Appendix appears normal. No evidence of bowel wall thickening, distention, or inflammatory changes. Sigmoid diverticulosis. Vascular/Lymphatic: Severe aortic atherosclerosis. No enlarged abdominal or pelvic lymph nodes. Reproductive: No mass or other abnormality. Other: No abdominal wall hernia or abnormality. No ascites. Musculoskeletal: No acute osseous findings. Disc degenerative disease and bridging osteophytosis throughout the thoracic and lumbar spine, in keeping with DISH.  IMPRESSION: 1. Small bilateral pleural effusions and associated atelectasis or consolidation. Interlobular septal thickening throughout consistent with pulmonary edema. 2. Cardiomegaly and coronary artery disease. Enlargement of the main pulmonary artery, as can be seen in pulmonary hypertension. 3. Mild gallbladder wall thickening. No visible gallstones. Intra and extrahepatic biliary ductal dilatation, common bile duct measuring up to 0.9 cm in caliber and appearing to taper to the ampulla without calculus or other obstruction visible. Appearance is similar to prior examination dated 12/30/2022. Correlate with biochemical findings and consider ultrasound or MRCP to further evaluate if indicated by clinical concern for cholecystitis. 4. Unchanged heterogeneously enhancing mass arising from the posterior midportion of the left kidney measuring 2.1 x 1.8 cm, consistent with renal cell carcinoma. 5. No  evidence of lymphadenopathy or metastatic disease in the chest, abdomen, or pelvis. Particularly, a previously seen enlarged, hypodense aortocaval lymph node is resolved on today's examination. 6. Nonobstructive right nephrolithiasis.  No hydronephrosis. Aortic Atherosclerosis (ICD10-I70.0). Electronically Signed   By: Marolyn Marcus Kemp Jaksch M.D.   On: 04/29/2024 20:44   DG Forearm Right Result Date: 04/29/2024 CLINICAL DATA:  right upper arm pain and right forearm pain after fall EXAM: RIGHT FOREARM - 2 VIEW COMPARISON:  None Available. FINDINGS: Diffuse osteopenia.No acute fracture or dislocation. There is no evidence of arthropathy or other focal bone abnormality. Gauze material overlying the proximal aspect of the dorsal forearm. IMPRESSION: Diffuse osteopenia. No acute fracture or malalignment of the radius and ulna. Electronically Signed   By: Rogelia Myers M.D.   On: 04/29/2024 17:25   DG Humerus Right Result Date: 04/29/2024 CLINICAL DATA:  right upper arm pain and right forearm pain after fall EXAM: RIGHT HUMERUS - 2+ VIEW COMPARISON:  None Available. FINDINGS: Osteopenia.No acute fracture or dislocation. There is no evidence of arthropathy or other focal bone abnormality. Soft tissues are unremarkable. IMPRESSION: Diffuse osteopenia.  No acute fracture or dislocation. Electronically Signed   By: Rogelia Myers M.D.   On: 04/29/2024 17:24        Scheduled Meds:  apixaban   2.5 mg Oral BID   empagliflozin   10 mg Oral Daily   feeding supplement  237 mL Oral BID BM   ferrous sulfate  325 mg Oral Q breakfast   midodrine   5 mg Oral BID WC   traZODone   100 mg Oral QHS   Continuous Infusions:  cefTRIAXone  (ROCEPHIN )  IV 200 mL/hr at 04/30/24 1643     LOS: 2 days      Calvin KATHEE Robson, MD [facility]   If 7PM-7AM, please contact night-coverage  05/01/2024, 3:01 PM

## 2024-05-02 ENCOUNTER — Inpatient Hospital Stay

## 2024-05-02 DIAGNOSIS — A419 Sepsis, unspecified organism: Secondary | ICD-10-CM | POA: Diagnosis not present

## 2024-05-02 DIAGNOSIS — R6520 Severe sepsis without septic shock: Secondary | ICD-10-CM | POA: Diagnosis not present

## 2024-05-02 LAB — BRAIN NATRIURETIC PEPTIDE: B Natriuretic Peptide: 591.1 pg/mL — ABNORMAL HIGH (ref 0.0–100.0)

## 2024-05-02 MED ORDER — FUROSEMIDE 10 MG/ML IJ SOLN
60.0000 mg | Freq: Once | INTRAMUSCULAR | Status: AC
Start: 2024-05-02 — End: 2024-05-02
  Administered 2024-05-02: 60 mg via INTRAVENOUS
  Filled 2024-05-02: qty 6

## 2024-05-02 MED ORDER — DOXYCYCLINE HYCLATE 100 MG PO TABS
100.0000 mg | ORAL_TABLET | Freq: Two times a day (BID) | ORAL | Status: DC
  Administered 2024-05-02 – 2024-05-03 (×3): 100 mg via ORAL
  Filled 2024-05-02 (×3): qty 1

## 2024-05-02 NOTE — Progress Notes (Signed)
 PROGRESS NOTE    Marcus Kemp  FMW:[id number] DOB: 25-Jun-1925 DOA: 04/29/2024 PCP: Auston Reyes [name], MD    Brief Narrative:  89 y.o. male with medical history significant for HFpEF, prior PE, persistent A-fib on Eliquis , pulmonary hypertension, renal cell carcinoma, rheumatic heart disease, CKD stage IIIa, hypotension with prior need for midodrine , who is being admitted with sepsis secondary to an infected laceration of the right forearm, which he sustained when he fell outdoors a couple days prior. Reportedly, patient has been having recent falls over the past few days resulting in a few falls.  When he cut his right forearm family washed it out with a garden hose.  Over the course of the last couple days however he has had a change in mentation.  EMS was initially called out for trouble shortness of breath. In the ED, Tmax 99 tachycardic from 93-109, tachypneic to the 20s with initial BP 79/49, improving to 101/56 with IV fluids.  O2 sat in the high 90s on room air.   Assessment & Plan:   Principal Problem:   Severe sepsis (HCC) Active Problems:   Infected laceration of skin, right forearm   Hypotension   (HFpEF) heart failure with preserved ejection fraction (HCC)   Acute metabolic encephalopathy   Longstanding persistent atrial fibrillation (HCC)   Chronic anticoagulation   History of pulmonary embolism   Stage 3a chronic kidney disease (HCC)   Anemia, macrocytic   Thrombocytopenia   Rheumatic mitral regurgitation   Renal cell carcinoma on annual CT surveillance (HCC)  Severe sepsis (HCC) Infected laceration right forearm History of severe penicillin allergy (anaphylaxis) Sepsis criteria include low-grade temp, tachycardia and tachypnea with hypotension, altered mental status, WBC 15,000, lactic acid 1.8 Wound was irrigated and sutured in emergency room by EDP Plan:Nonpurulent infection. Continue Rocephin  will add doxy  Follow blood cultures, no growth to date.   Continue local wound care.  Pain control as needed.  No indication for surgical consultation.  Consider d/c tomorrow  Hypotension History of hypotension requiring midodrine  BP 79/49 on arrival, improving to 101/56 with fluids Suspect secondary to sepsis, however has used midodrine  in the past Continue to monitor Midodrine  if needed to maintain maps over 65 Blood pressure stabilized   Acute metabolic encephalopathy Suspect secondary to sepsis Patient with level of mentation unclear Neurologic checks with fall and aspiration precautions Delirium precautions Mental status improved  Hypoxia O2 upper 80s. Did get fluids. On apixaban  so PE unlikely. - cxr, bnp - lasix  60 IV   (HFpEF) heart failure with preserved ejection fraction (HCC) History of rheumatic tricuspid regurgitation Clinically euvolemic to dry CT chest from 5 days ago showing stable cardiomegaly, small pleural effusions, possible pulmonary edema  no further IV fluids indicated - hasn't been receiving home torsemide  20 bid, will give lasix  60 IV once as above   Longstanding persistent atrial fibrillation (HCC) History of pulmonary embolism Chronic anticoagulation Continue apixaban  Not currently on rate control agents   Stage 3a chronic kidney disease (HCC) Renal function at most recent baseline Continue to monitor   Anemia, macrocytic Thrombocytopenia Iron deficiency anemia Previously evaluated by oncology increased LDH likely due to increased RBC turnover secondary to destruction from heart valve disease or presumed kidney cancer--- no further workup in view of advanced age Hemoglobin 11, down from 12.2 a couple weeks prior Platelets 104, down from 125 a couple weeks prior CT chest abdomen pelvis with contrast unrevealing for etiology Plan: P.o. iron replacement   Rheumatic mitral regurgitation No  acute issues suspected   Renal cell carcinoma on annual CT surveillance [facility]) Stable findings on CT abdomen  and pelvis Per oncology note 01/2023: In view of advanced age .SABRASABRApatient and his wife are not interested in any surgery or biopsy     DVT prophylaxis: eliquis  Code Status: DNR Family Communication: Spouse and children at bedside 10/8 Disposition Plan: Status is: Inpatient Remains inpatient appropriate because: Sepsis secondary to right forearm wound.  Remains on IV antibiotics   Level of care: Telemetry Medical  Consultants:  None  Procedures:  None  Antimicrobials: Ceftriaxone   Doxycycline    Subjective: Seen and examined.  Wife at bedside.  No complaints  Objective: Vitals:   05/01/24 1616 05/01/24 2108 05/02/24 0524 05/02/24 0856  BP: 130/66 114/69 121/64 123/68  Pulse: 88 98  100  Resp: 16 16 16 17   Temp: 97.9 F (36.6 C) 98 F (36.7 C) 98.7 F (37.1 C) 100.3 F (37.9 C)  TempSrc: Oral Oral Oral   SpO2: 97% 96% 92% 99%  Weight:      Height:        Intake/Output Summary (Last 24 hours) at 05/02/2024 1341 Last data filed at 05/02/2024 0900 Gross per 24 hour  Intake 120 ml  Output 50 ml  Net 70 ml   Filed Weights   04/29/24 1641  Weight: 64.7 kg    Examination:  General exam: Appears frail Respiratory system: .  Normal work of breathing.  Faint exp wheeze Cardiovascular system: S1-S2, RRR, no murmurs, no pedal edema Gastrointestinal system: Soft/ND, normal bowel sounds Central nervous system: Alert.  Oriented x 2.  No focal deficits Extremities: Symmetric 5 x 5 power. Skin: Right arm wound status post laceration repair  Psychiatry: calm    Data Reviewed: I have personally reviewed following labs and imaging studies  CBC: Recent Labs  Lab 04/29/24 1652 04/30/24 0553 05/01/24 0858  WBC 15.5* 11.4* 8.9  NEUTROABS 13.1*  --  6.1  HGB 11.0* 10.4* 10.6*  HCT 32.9* 31.8* 32.8*  MCV 107.9* 110.4* 109.7*  PLT 104* 93* 98*   Basic Metabolic Panel: Recent Labs  Lab 04/29/24 1652 04/30/24 0553 05/01/24 0550 05/01/24 0858  NA 135 135  --   138  K 4.9 3.6  --  4.1  CL 99 102  --  102  CO2 24 23  --  22  GLUCOSE 103* 90  --  97  BUN 29* 26*  --  26*  CREATININE 1.33* 1.10 1.05 1.04  CALCIUM 8.4* 8.3*  --  8.7*   GFR: Estimated Creatinine Clearance: 36.1 mL/min (by C-G formula based on SCr of 1.04 mg/dL). Liver Function Tests: Recent Labs  Lab 04/29/24 1652 04/30/24 0553  AST 40 28  ALT 16 14  ALKPHOS 80 66  BILITOT 3.2* 3.7*  PROT 6.2* 5.7*  ALBUMIN 2.8* 2.7*   No results for input(s): LIPASE, AMYLASE in the last 168 hours. No results for input(s): AMMONIA in the last 168 hours. Coagulation Profile: Recent Labs  Lab 04/29/24 1652  INR 1.7*   Cardiac Enzymes: No results for input(s): CKTOTAL, CKMB, CKMBINDEX, TROPONINI in the last 168 hours. BNP (last 3 results) No results for input(s): PROBNP in the last 8760 hours. HbA1C: No results for input(s): HGBA1C in the last 72 hours. CBG: No results for input(s): GLUCAP in the last 168 hours. Lipid Profile: No results for input(s): CHOL, HDL, LDLCALC, TRIG, CHOLHDL, LDLDIRECT in the last 72 hours. Thyroid Function Tests: No results for input(s): TSH, T4TOTAL, FREET4,  T3FREE, THYROIDAB in the last 72 hours. Anemia Panel: Recent Labs    04/29/24 1645 04/30/24 0034 04/30/24 0553  VITAMINB12  --   --  728  FOLATE  --  6.0  --   FERRITIN  --  75  --   TIBC  --  291  --   IRON  --  32*  --   RETICCTPCT 3.5*  --   --    Sepsis Labs: Recent Labs  Lab 04/29/24 1652 04/29/24 1902  LATICACIDVEN 3.0* 1.8    Recent Results (from the past 240 hours)  Culture, blood (Routine x 2)     Status: None (Preliminary result)   Collection Time: 04/29/24  4:52 PM   Specimen: BLOOD LEFT ARM  Result Value Ref Range Status   Specimen Description BLOOD LEFT ARM  Final   Special Requests   Final    BOTTLES DRAWN AEROBIC AND ANAEROBIC Blood Culture results may not be optimal due to an inadequate volume of blood received in culture  bottles   Culture   Final    NO GROWTH 3 DAYS Performed at [facility], [address], [place], [place] [address]    Report Status PENDING  Incomplete  Culture, blood (Routine x 2)     Status: None (Preliminary result)   Collection Time: 04/29/24  4:52 PM   Specimen: BLOOD  Result Value Ref Range Status   Specimen Description BLOOD LEFT ANTECUBITAL  Final   Special Requests   Final    BOTTLES DRAWN AEROBIC AND ANAEROBIC Blood Culture results may not be optimal due to an inadequate volume of blood received in culture bottles   Culture   Final    NO GROWTH 3 DAYS Performed at [facility], [address] [place], [place] [address]    Report Status PENDING  Incomplete  Resp panel by RT-PCR (RSV, Flu A&B, Covid) Anterior Nasal Swab     Status: None   Collection Time: 04/29/24  9:09 PM   Specimen: Anterior Nasal Swab  Result Value Ref Range Status   SARS Coronavirus 2 by RT PCR NEGATIVE NEGATIVE Final    Comment: (NOTE) SARS-CoV-2 target nucleic acids are NOT DETECTED.  The SARS-CoV-2 RNA is generally detectable in upper respiratory specimens during the acute phase of infection. The lowest concentration of SARS-CoV-2 viral copies this assay can detect is 138 copies/mL. A negative result does not preclude SARS-Cov-2 infection and should not be used as the sole basis for treatment or other patient management decisions. A negative result may occur with  improper specimen collection/handling, submission of specimen other than nasopharyngeal swab, presence of viral mutation(s) within the areas targeted by this assay, and inadequate number of viral copies(<138 copies/mL). A negative result must be combined with clinical observations, patient history, and epidemiological information. The expected result is Negative.  Fact Sheet for Patients:  BloggerCourse.com  Fact Sheet for Healthcare Providers:   SeriousBroker.it  This test is no t yet approved or cleared by the [place]  FDA and  has been authorized for detection and/or diagnosis of SARS-CoV-2 by FDA under an Emergency Use Authorization (EUA). This EUA will remain  in effect (meaning this test can be used) for the duration of the COVID-19 declaration under Section 564(b)(1) of the Act, 21 U.S.C.section 360bbb-3(b)(1), unless the authorization is terminated  or revoked sooner.       Influenza A by PCR NEGATIVE NEGATIVE Final   Influenza B by PCR NEGATIVE NEGATIVE Final  Comment: (NOTE) The Xpert Xpress SARS-CoV-2/FLU/RSV plus assay is intended as an aid in the diagnosis of influenza from Nasopharyngeal swab specimens and should not be used as a sole basis for treatment. Nasal washings and aspirates are unacceptable for Xpert Xpress SARS-CoV-2/FLU/RSV testing.  Fact Sheet for Patients: BloggerCourse.com  Fact Sheet for Healthcare Providers: SeriousBroker.it  This test is not yet approved or cleared by the [place]  FDA and has been authorized for detection and/or diagnosis of SARS-CoV-2 by FDA under an Emergency Use Authorization (EUA). This EUA will remain in effect (meaning this test can be used) for the duration of the COVID-19 declaration under Section 564(b)(1) of the Act, 21 U.S.C. section 360bbb-3(b)(1), unless the authorization is terminated or revoked.     Resp Syncytial Virus by PCR NEGATIVE NEGATIVE Final    Comment: (NOTE) Fact Sheet for Patients: BloggerCourse.com  Fact Sheet for Healthcare Providers: SeriousBroker.it  This test is not yet approved or cleared by the [place]  FDA and has been authorized for detection and/or diagnosis of SARS-CoV-2 by FDA under an Emergency Use Authorization (EUA). This EUA will remain in effect (meaning this test can be used) for  the duration of the COVID-19 declaration under Section 564(b)(1) of the Act, 21 U.S.C. section 360bbb-3(b)(1), unless the authorization is terminated or revoked.  Performed at [facility], [address] [place], [place] [address]          Radiology Studies: No results found.       Scheduled Meds:  apixaban   2.5 mg Oral BID   doxycycline   100 mg Oral Q12H   empagliflozin   10 mg Oral Daily   feeding supplement  237 mL Oral BID BM   ferrous sulfate  325 mg Oral Q breakfast   midodrine   5 mg Oral BID WC   traZODone   100 mg Oral QHS   Continuous Infusions:  cefTRIAXone  (ROCEPHIN )  IV 2 g (05/01/24 1721)     LOS: 3 days      Devaughn KATHEE Ban, MD [facility]   If 7PM-7AM, please contact night-coverage  05/02/2024, 1:41 PM

## 2024-05-02 NOTE — Care Management Important Message (Signed)
 Important Message  Patient Details  Name: Marcus Kemp MRN: [id number] Date of Birth: 1924-08-26   Important Message Given:  Yes - Medicare IM     Dulcey Riederer W, CMA 05/02/2024, 11:22 AM

## 2024-05-02 NOTE — Plan of Care (Signed)

## 2024-05-02 NOTE — TOC Progression Note (Signed)
 Transition of Care [facility]) - Progression Note    Patient Details  Name: Marcus Kemp MRN: [id number] Date of Birth: [birth year]  [facility] of [facility] CM/SW Contact  Marinda Cooks, RN Phone Number: 05/02/2024, 2:41 PM  Clinical Narrative:    This CM updated by MD pt is  not medically cleared to dc today. PT recommended HH PT at dc . This CM spoke with pt's wife introduced role  and discussed DC plan for [facility] and she agreed with agreed recommendation . [name] provided choice and confirmed she did not have a preference. HH coordinated with [facility] at dc when pt is medically stable. TOC will cont to follow dc planning / care coordination and update as applicable .                      Expected Discharge Plan and Services                                               Social Drivers of Health (SDOH) Interventions SDOH Screenings   Food Insecurity: Patient Unable To Answer (04/30/2024)  Housing: Low Risk  (04/30/2024)  Transportation Needs: Patient Unable To Answer (04/30/2024)  Utilities: Not At Risk (04/29/2024)  Financial Resource Strain: Low Risk  (12/21/2023)   Received from [facility]  Social Connections: Unknown (04/30/2024)  Tobacco Use: Medium Risk (04/29/2024)    Readmission Risk Interventions     No data to display

## 2024-05-02 NOTE — Evaluation (Signed)
 Physical Therapy Evaluation Patient Details Name: Marcus Kemp MRN: [id number] DOB: 01-Jun-1925 Today's Date: 05/02/2024  History of Present Illness  Marcus Kemp is a 89 y.o. male with medical history significant for HFpEF, prior PE, persistent A-fib on Eliquis , pulmonary hypertension, renal cell carcinoma, rheumatic heart disease, CKD stage IIIa, hypotension with prior need for midodrine , who is being admitted with sepsis secondary to an infected laceration of the right forearm, which he sustained when he fell outdoors a couple days prior.  Clinical Impression  Pt very HOH, but pleasant and eager to work with PT.  He did not have shortness of breath t/o the session, but did have O2 drop from high 90s to mid/low 80s during ambulation on room air.  Pt moved quite well and though he has some confusion and generally needed a lot of cuing/encouragement pt's wife reports that she feels that they will be able to insure assist around the home.      If plan is discharge home, recommend the following: A little help with walking and/or transfers;A little help with bathing/dressing/bathroom;Assistance with feeding;Assist for transportation   Can travel by private vehicle        Equipment Recommendations None recommended by PT  Recommendations for Other Services       Functional Status Assessment Patient has had a recent decline in their functional status and demonstrates the ability to make significant improvements in function in a reasonable and predictable amount of time.     Precautions / Restrictions Precautions Precautions: Fall Recall of Precautions/Restrictions: Impaired Restrictions Weight Bearing Restrictions Per Provider Order: No      Mobility  Bed Mobility Overal bed mobility: Needs Assistance Bed Mobility: Supine to Sit     Supine to sit: Contact guard, Min assist     General bed mobility comments: light assist with getting LEs around to EOB, pt made good effort  and did not need a lot of phyiscal assist    Transfers Overall transfer level: Needs assistance Equipment used: Rolling walker (2 wheels) Transfers: Sit to/from Stand Sit to Stand: Contact guard assist           General transfer comment: Pt was able to rise to standing from 2 elevated bed height but did not need physical assist.  Heavy relinace on walker but safe and appropriate    Ambulation/Gait Ambulation/Gait assistance: Contact guard assist Gait Distance (Feet): 90 Feet Assistive device: Rolling walker (2 wheels)         General Gait Details: Pt with slow and hunched gait, but with heavy cuing/encouragement was able to show ability to do a nearly 162ft walk.  Pt on room air with the effort and SpO2 did drop to low 80s w/o a lot of DOE/SOB but had general fatigue.  Stairs            Wheelchair Mobility     Tilt Bed    Modified Rankin (Stroke Patients Only)       Balance Overall balance assessment: Needs assistance Sitting-balance support: No upper extremity supported, Feet supported Sitting balance-Leahy Scale: Good     Standing balance support: No upper extremity supported, During functional activity Standing balance-Leahy Scale: Fair Standing balance comment: expected forward lean on walker, but no LOBs during the effort.                             Pertinent Vitals/Pain Pain Assessment Pain Assessment: Faces Faces Pain Scale: Hurts a  little bit Pain Location: itchy back    Home Living Family/patient expects to be discharged to:: Private residence Living Arrangements: Spouse/significant other Available Help at Discharge: Family Type of Home: House Home Access: Level entry       Home Layout:  (2 steps to the den) Home Equipment: Agricultural consultant (2 wheels);Cane - single point;Grab bars - tub/shower      Prior Function Prior Level of Function : Needs assist             Mobility Comments: does not drive but will go to the  store with wife, out to porch/yard daily ADLs Comments: wife provides set up assist at home     Extremity/Trunk Assessment   Upper Extremity Assessment Upper Extremity Assessment: Defer to OT evaluation RUE Deficits / Details: limited mobility in RUE due to edema    Lower Extremity Assessment Lower Extremity Assessment: Generalized weakness;Overall WFL for tasks assessed (age appropriate defecits)       Communication   Communication Communication: Impaired Factors Affecting Communication: Hearing impaired    Cognition Arousal: Alert Behavior During Therapy: WFL for tasks assessed/performed                             Following commands: Impaired Following commands impaired: Follows one step commands inconsistently     Cueing Cueing Techniques: Tactile cues, Verbal cues     General Comments General comments (skin integrity, edema, etc.): on arrival SpO2 99% on 2L, removed to room air and maintains mid 90s with bed mobility and discussion EOB,  ambulation (~100 ft) on room air SpO2 dropped to mid/low 80s.    Exercises     Assessment/Plan    PT Assessment Patient needs continued PT services  PT Problem List Decreased strength;Decreased range of motion;Decreased activity tolerance;Decreased balance;Decreased safety awareness       PT Treatment Interventions DME instruction    PT Goals (Current goals can be found in the Care Plan section)  Acute Rehab PT Goals Patient Stated Goal: go home ASAP PT Goal Formulation: With patient/family (wife present) Time For Goal Achievement: 05/15/24 Potential to Achieve Goals: Fair    Frequency Min 2X/week     Co-evaluation               AM-PAC PT 6 Clicks Mobility  Outcome Measure Help needed turning from your back to your side while in a flat bed without using bedrails?: A Little Help needed moving from lying on your back to sitting on the side of a flat bed without using bedrails?: A Little Help  needed moving to and from a bed to a chair (including a wheelchair)?: A Little Help needed standing up from a chair using your arms (e.g., wheelchair or bedside chair)?: A Little Help needed to walk in hospital room?: A Little Help needed climbing 3-5 steps with a railing? : A Little 6 Click Score: 18    End of Session Equipment Utilized During Treatment: Gait belt Activity Tolerance: Patient tolerated treatment well Patient left: with chair alarm set;with call bell/phone within reach Nurse Communication: Mobility status (O2) PT Visit Diagnosis: Muscle weakness (generalized) (M62.81);Difficulty in walking, not elsewhere classified (R26.2)    Time: 9048-9079 PT Time Calculation (min) (ACUTE ONLY): 1409 min   Charges:   PT Evaluation $PT Eval Low Complexity: 1 Low PT Treatments $Gait Training: 8-22 mins PT General Charges $$ ACUTE PT VISIT: 1 Visit  Carmin JONELLE Deed, DPT 05/02/2024, 12:44 PM

## 2024-05-02 NOTE — Evaluation (Signed)
 Occupational Therapy Evaluation Patient Details Name: Marcus Kemp MRN: [id number] DOB: July 24, 1925 Today's Date: 05/02/2024   History of Present Illness   Marcus Kemp is a 89 y.o. male with medical history significant for HFpEF, prior PE, persistent A-fib on Eliquis , pulmonary hypertension, renal cell carcinoma, rheumatic heart disease, CKD stage IIIa, hypotension with prior need for midodrine , who is being admitted with sepsis secondary to an infected laceration of the right forearm, which he sustained when he fell outdoors a couple days prior.     Clinical Impressions Patient was seen for OT evaluation this date. Prior to hospital admission, patient was receiving assist from spouse for ADLs and ambulating with AD. Patient lives in single story home with spouse support. Patient has been hospitalized due to fall with injury to R arm resulting in sepsis.  Patient presents with deficits in activity tolerance, gross strength, RUE mobility and inconsistent command following, affecting safe and optimal ADL completion. Patient is currently requiring mod A for dressing/bathing due to RUE limitations.  During session, patient on 2L of O2 at rest, desatted to 87% after functional standing task; returned to 95% with cues for pursed lip breathing. Patient would benefit from skilled OT services to address noted impairments and functional limitations (see below for any additional details) in order to maximize safety and independence while minimizing future risk of falls, injury, and readmission. Anticipate the need for follow up OT services upon acute hospital DC.      If plan is discharge home, recommend the following:   A little help with walking and/or transfers;A little help with bathing/dressing/bathroom;A lot of help with bathing/dressing/bathroom;Assistance with cooking/housework;Direct supervision/assist for medications management;Help with stairs or ramp for entrance     Functional  Status Assessment   Patient has had a recent decline in their functional status and demonstrates the ability to make significant improvements in function in a reasonable and predictable amount of time.     Equipment Recommendations   BSC/3in1     Recommendations for Other Services         Precautions/Restrictions   Precautions Precautions: Fall Recall of Precautions/Restrictions: Impaired Restrictions Weight Bearing Restrictions Per Provider Order: No     Mobility Bed Mobility               General bed mobility comments: not tested    Transfers Overall transfer level: Needs assistance Equipment used: Rolling walker (2 wheels) Transfers: Sit to/from Stand Sit to Stand: Min assist                  Balance Overall balance assessment: Needs assistance Sitting-balance support: No upper extremity supported, Feet supported Sitting balance-Leahy Scale: Good     Standing balance support: No upper extremity supported, During functional activity Standing balance-Leahy Scale: Good Standing balance comment: standing at sink during grooming                           ADL either performed or assessed with clinical judgement   ADL Overall ADL's : Needs assistance/impaired                                       General ADL Comments: min A grooming (wash face), cues (tactile/verbal) for brushing teeth due to R arm impairment     Vision         Perception  Praxis         Pertinent Vitals/Pain Pain Assessment Pain Assessment: Faces Faces Pain Scale: Hurts a little bit Pain Location: RUE Pain Descriptors / Indicators: Sore Pain Intervention(s): Monitored during session     Extremity/Trunk Assessment Upper Extremity Assessment Upper Extremity Assessment: RUE deficits/detail RUE Deficits / Details: limited mobility in RUE due to edema   Lower Extremity Assessment Lower Extremity Assessment: Generalized  weakness       Communication Communication Communication: No apparent difficulties   Cognition Arousal: Alert Behavior During Therapy: WFL for tasks assessed/performed Cognition: Difficult to assess Difficult to assess due to: Hard of hearing/deaf                             Following commands: Impaired Following commands impaired: Follows one step commands inconsistently     Cueing  General Comments   Cueing Techniques: Tactile cues;Verbal cues  edema to RUE, dressing C/D/I to RUE, 2L of O2 via [place], 87% sitting post activity   Exercises     Shoulder Instructions      Home Living Family/patient expects to be discharged to:: Private residence Living Arrangements: Spouse/significant other Available Help at Discharge: Family Type of Home: House Home Access: Stairs to enter     Home Layout: One level     Bathroom Shower/Tub: Walk-in Energy manager Accessibility: Yes   Home Equipment: Agricultural consultant (2 wheels);Cane - single point;Grab bars - tub/shower          Prior Functioning/Environment Prior Level of Function : Needs assist               ADLs Comments: wife provides set up assist at home    OT Problem List: Decreased strength;Decreased range of motion;Decreased activity tolerance;Impaired balance (sitting and/or standing);Decreased safety awareness;Impaired UE functional use   OT Treatment/Interventions: Self-care/ADL training;DME and/or AE instruction;Energy conservation;Therapeutic exercise;Patient/family education;Therapeutic activities      OT Goals(Current goals can be found in the care plan section)   Acute Rehab OT Goals Patient Stated Goal: to go home OT Goal Formulation: With patient Time For Goal Achievement: 05/16/24 Potential to Achieve Goals: Good ADL Goals Pt Will Perform Grooming: with set-up;with supervision;standing Pt Will Perform Lower Body Dressing: with set-up;with supervision;sit to/from stand Pt Will  Transfer to Toilet: with set-up;with supervision;ambulating;regular height toilet   OT Frequency:  Min 2X/week    Co-evaluation              AM-PAC OT 6 Clicks Daily Activity     Outcome Measure Help from another person eating meals?: A Little Help from another person taking care of personal grooming?: A Little Help from another person toileting, which includes using toliet, bedpan, or urinal?: A Little Help from another person bathing (including washing, rinsing, drying)?: A Lot Help from another person to put on and taking off regular upper body clothing?: A Little Help from another person to put on and taking off regular lower body clothing?: A Lot 6 Click Score: 16   End of Session Equipment Utilized During Treatment: Rolling walker (2 wheels);Oxygen Nurse Communication: Mobility status  Activity Tolerance: Patient tolerated treatment well Patient left: in chair;with chair alarm set;with family/visitor present;with call bell/phone within reach  OT Visit Diagnosis: Unsteadiness on feet (R26.81);History of falling (Z91.81)                Time: 8993-8958 OT Time Calculation (min): 35 min Charges:  OT General Charges $  OT Visit: 1 Visit OT Evaluation $OT Eval Moderate Complexity: 1 Mod OT Treatments $Self Care/Home Management : 8-22 mins  Marcus Kemp, OT/L [facility], 05/02/2024   Marcus Kemp 05/02/2024, 11:16 AM

## 2024-05-03 DIAGNOSIS — R6520 Severe sepsis without septic shock: Secondary | ICD-10-CM | POA: Diagnosis not present

## 2024-05-03 DIAGNOSIS — A419 Sepsis, unspecified organism: Secondary | ICD-10-CM | POA: Diagnosis not present

## 2024-05-03 LAB — BASIC METABOLIC PANEL WITH GFR
Anion gap: 13 (ref 5–15)
BUN: 27 mg/dL — ABNORMAL HIGH (ref 8–23)
CO2: 24 mmol/L (ref 22–32)
Calcium: 8.1 mg/dL — ABNORMAL LOW (ref 8.9–10.3)
Chloride: 101 mmol/L (ref 98–111)
Creatinine, Ser: 1.08 mg/dL (ref 0.61–1.24)
GFR, Estimated: >60 mL/min (ref >60)
Glucose, Bld: 90 mg/dL (ref 70–99)
Potassium: 3.6 mmol/L (ref 3.5–5.1)
Sodium: 138 mmol/L (ref 135–145)

## 2024-05-03 MED ORDER — FUROSEMIDE 10 MG/ML IJ SOLN
60.0000 mg | Freq: Once | INTRAMUSCULAR | Status: AC
  Administered 2024-05-03: 60 mg via INTRAVENOUS
  Filled 2024-05-03: qty 6

## 2024-05-03 MED ORDER — CEFADROXIL 500 MG PO CAPS: 500.0000 mg | ORAL_CAPSULE | Freq: Two times a day (BID) | ORAL | 0 refills | Status: AC

## 2024-05-03 MED ORDER — DOXYCYCLINE HYCLATE 100 MG PO TABS: 100.0000 mg | ORAL_TABLET | Freq: Two times a day (BID) | ORAL | 0 refills | Status: AC

## 2024-05-03 NOTE — Progress Notes (Signed)
 Occupational Therapy Treatment Patient Details Name: Marcus Kemp MRN: [id number] DOB: 03-30-1925 Today's Date: 05/03/2024   History of present illness Marcus Kemp is a 89 y.o. male with medical history significant for HFpEF, prior PE, persistent A-fib on Eliquis , pulmonary hypertension, renal cell carcinoma, rheumatic heart disease, CKD stage IIIa, hypotension with prior need for midodrine , who is being admitted with sepsis secondary to an infected laceration of the right forearm, which he sustained when he fell outdoors a couple days prior.   OT comments  OT supine in bed with spouse present upon OT arrival, no reports of pain/discomfort and agreeable to work with therapy. Patient required min A to transition from supine to EOB to lift trunk. Patient required min A to stand from EOB with cues for hand placement, still presents with stiffness in RUE due to wound/dressing. Patient ambulated to bathroom with walker to perform toileting/grooming (see below for level of assist). Wife requests patient don depends which she provided from home (see below for level of assist needed to complete task). Patient not on O2, O2 measuring 93% and no dyspnea noted during time. Patient ended visit in recliner with spouse present and all items within reach with chair alarm on. Wife with no questions/concerns regarding assisting patient upon discharge.        If plan is discharge home, recommend the following:  A little help with walking and/or transfers;A little help with bathing/dressing/bathroom;A lot of help with bathing/dressing/bathroom;Assistance with cooking/housework;Direct supervision/assist for medications management;Help with stairs or ramp for entrance   Equipment Recommendations  BSC/3in1    Recommendations for Other Services      Precautions / Restrictions Precautions Precautions: Fall Recall of Precautions/Restrictions: Impaired Restrictions Weight Bearing Restrictions Per Provider  Order: No       Mobility Bed Mobility Overal bed mobility: Needs Assistance Bed Mobility: Supine to Sit     Supine to sit: Min assist     General bed mobility comments: light assist with getting LEs around to EOB, pt made good effort and did not need a lot of phyiscal assist    Transfers Overall transfer level: Needs assistance Equipment used: Rolling walker (2 wheels) Transfers: Sit to/from Stand Sit to Stand: Contact guard assist                 Balance Overall balance assessment: Needs assistance Sitting-balance support: No upper extremity supported, Feet supported Sitting balance-Leahy Scale: Good     Standing balance support: No upper extremity supported, During functional activity Standing balance-Leahy Scale: Good                             ADL either performed or assessed with clinical judgement   ADL Overall ADL's : Needs assistance/impaired     Grooming: Wash/dry hands;Contact guard assist;Standing Grooming Details (indicate cue type and reason): tactile cues for thoroughness             Lower Body Dressing: Maximal assistance;Sit to/from stand;Cueing for safety Lower Body Dressing Details (indicate cue type and reason): A to thread BLE into depends, able to pull up over L hip but A to pull up over R hip due to limited RUE mobility Toilet Transfer: Minimal assistance;Rolling walker (2 wheels);Regular Toilet;Grab bars           Functional mobility during ADLs: Rolling walker (2 wheels);Contact guard assist      Extremity/Trunk Assessment Upper Extremity Assessment Upper Extremity Assessment: RUE deficits/detail RUE Deficits /  Details: limited mobility in RUE due to edema   Lower Extremity Assessment Lower Extremity Assessment: Generalized weakness        Vision       Perception     Praxis     Communication Communication Communication: Impaired Factors Affecting Communication: Hearing impaired   Cognition  Arousal: Alert Behavior During Therapy: WFL for tasks assessed/performed Cognition: Difficult to assess Difficult to assess due to: Hard of hearing/deaf                             Following commands: Impaired Following commands impaired: Follows one step commands inconsistently      Cueing   Cueing Techniques: Tactile cues, Verbal cues  Exercises      Shoulder Instructions       General Comments O2 93% on RA    Pertinent Vitals/ Pain       Pain Assessment Pain Assessment: No/denies pain  Home Living                                          Prior Functioning/Environment              Frequency  Min 2X/week        Progress Toward Goals  OT Goals(current goals can now be found in the care plan section)  Progress towards OT goals: Progressing toward goals  Acute Rehab OT Goals Patient Stated Goal: to go home OT Goal Formulation: With patient Time For Goal Achievement: 05/16/24 Potential to Achieve Goals: Good ADL Goals Pt Will Perform Grooming: with set-up;with supervision;standing Pt Will Perform Lower Body Dressing: with set-up;with supervision;sit to/from stand Pt Will Transfer to Toilet: with set-up;with supervision;ambulating;regular height toilet  Plan      Co-evaluation                 AM-PAC OT 6 Clicks Daily Activity     Outcome Measure   Help from another person eating meals?: A Little Help from another person taking care of personal grooming?: A Little Help from another person toileting, which includes using toliet, bedpan, or urinal?: A Little Help from another person bathing (including washing, rinsing, drying)?: A Lot Help from another person to put on and taking off regular upper body clothing?: A Little Help from another person to put on and taking off regular lower body clothing?: A Lot 6 Click Score: 16    End of Session Equipment Utilized During Treatment: Rolling walker (2 wheels)  OT  Visit Diagnosis: Unsteadiness on feet (R26.81);History of falling (Z91.81)   Activity Tolerance Patient tolerated treatment well   Patient Left in chair;with chair alarm set;with family/visitor present;with call bell/phone within reach   Nurse Communication          Time: 8879-8853 OT Time Calculation (min): 26 min  Charges: OT General Charges $OT Visit: 1 Visit OT Treatments $Self Care/Home Management : 23-37 mins  Rogers Clause, OT/L [facility], 05/03/2024   Maryelizabeth CHRISTELLA Clause 05/03/2024, 12:05 PM

## 2024-05-03 NOTE — Plan of Care (Signed)

## 2024-05-03 NOTE — Evaluation (Signed)
 Clinical/Bedside Swallow Evaluation Patient Details  Name: Marcus Kemp MRN: [id number] Date of Birth: [birth year]  Today's Date: 05/03/2024 Time: SLP Start Time (ACUTE ONLY): 0850 SLP Stop Time (ACUTE ONLY): 0915 SLP Time Calculation (min) (ACUTE ONLY): 25 min  Past Medical History:  Past Medical History:  Diagnosis Date   A-fib (HCC)    a.) CHA2DS2-VASc = 4 (age x 2, CHF, aortic plaque). b.) s/p unsuccessful TEE cardioversion 04/28/2016 --> rec'd 200 J x1 and 360 J x 2. c.) rate/rhythm maintained on oral metoprolol  succinante; chronically anticoagulated using dose reduced apixaban .   Anemia    Aortic atherosclerosis    Arthritis    CHF (congestive heart failure) (HCC)    a.) TTE 04/26/2016: EF >55%; mod LVH; MVP; mild-mod LAE, mild MR. b.) TTE 05/14/209: EF >55%; mod RVE; mild BAE; mod MR.TR; triv AR/PR. c.) TTE 03/28/2019: EF >55%; mild LVH; mod BAE; mild PR, mod MR/TR.   GERD (gastroesophageal reflux disease)    Heart murmur    History of kidney stones    HOH (hard of hearing)    a.) hearing aid to LEFT ear.   Hyperlipidemia    Hypertension    Infrarenal abdominal aortic aneurysm (AAA) without rupture 05/05/2021   a.) CT 05/05/2021 --> measured 3.0 cm.   MVP (mitral valve prolapse)    Peripheral edema    Pneumonia    Pulmonary embolus (HCC)    Pulmonary HTN (HCC) 04/26/2016   a.) TTE 04/26/2016: EF >55%; PASP 45. b.) TTE 12/06/2017: EF >55%; RVSP 76.6 mmHg. c.) TTE 03/28/2019: EF >55%; RVSP 77.8 mmHg.   Renal cell carcinoma (HCC)    Rheumatic fever    Sepsis (HCC)    Valvular heart disease    Past Surgical History:  Past Surgical History:  Procedure Laterality Date   INGUINAL HERNIA REPAIR Left 01/13/2022   Procedure: HERNIA REPAIR INGUINAL ADULT;  Surgeon: Dessa Reyes ORN, MD;  Location: [facility] ORS;  Service: General;  Laterality: Left;   INSERTION OF MESH  01/13/2022   Procedure: INSERTION OF MESH;  Surgeon: Dessa Reyes ORN, MD;  Location: [facility] ORS;  Service:  General;;   LITHOTRIPSY     TEE WITH CARDIOVERSION N/A    P   TONSILLECTOMY     HPI:  Per H&P, 89 y.o. male with medical history significant for HFpEF, prior PE, persistent A-fib on Eliquis , pulmonary hypertension, renal cell carcinoma, rheumatic heart disease, CKD stage IIIa, hypotension with prior need for midodrine , who is being admitted with sepsis secondary to an infected laceration of the right forearm, which he sustained when he fell outdoors a couple days prior.  Reportedly, patient has been having recent falls over the past few days resulting in a few falls.  When he cut his right forearm family washed it out with a garden hose.  Over the course of the last couple days however he has had a change in mentation.  EMS was initially called out for trouble shortness of breath.  In the ED, Tmax 99 tachycardic from 93-109, tachypneic to the 20s with initial BP 79/49, improving to 101/56 with IV fluids.  O2 sat in the high 90s on room air.    Assessment / Plan / Recommendation  Clinical Impression  Pt seen for bedside swallow assessment s/p choking event reported by spouse. Previous BSE completed in May of 2024- with recommendations for regular solids and thin liquids. During that assessment pt with intermittent coughing and wet vocal quality following thin liquid administration of medications.  Wife reported intermittent throat clearing at baseline.   Today, pt on room air, with O2 saturations 91-93 when spot checked. Pt seen with trials of thin liquids (via straw), puree, and regular/chopped solids. Intermittent throat clear noted- not directly timed with swallow completion. No change to vocal quality across trials. Oral phase grossly intact- with min increased time for manipulation and clearance of regular solid from oral cavity. Suspect increased time for mastication/clearance related to reduced attention to task of eating- potentially intertwined with resolving metabolic encephalopathy.   Pt  denied history of dysphagia. Increased risk for aspiration noted in the setting of acute deconditioning, age, hx of GERD, pulmonary status (concern for hypoxia, imaging revealing probable mild bibasilar pulmonary edema with atelectasis and pleural effusions), and current dependency with feeding. Therefore, recommend aspiration precautions (slow rate, small bites, elevated HOB, and alert for PO intake). Continue with current diet. Strongly suspect that pt is approximating oropharyngeal baseline, no follow up SLP services indicated at this. MD and RN aware of recommendations.   SLP Visit Diagnosis: Dysphagia, unspecified (R13.10)    Aspiration Risk  Moderate aspiration risk    Diet Recommendation   Dysphagia 2 (chopped);Thin  Medication Administration: Whole meds with puree    Other  Recommendations Oral Care Recommendations: Oral care BID;Staff/trained caregiver to provide oral care     Assistance Recommended at Discharge  Supervision and assist with PO intake.   Functional Status Assessment Patient has not had a recent decline in their functional status    Swallow Study   General Date of Onset: 05/03/24 HPI: Per H&P, 89 y.o. male with medical history significant for HFpEF, prior PE, persistent A-fib on Eliquis , pulmonary hypertension, renal cell carcinoma, rheumatic heart disease, CKD stage IIIa, hypotension with prior need for midodrine , who is being admitted with sepsis secondary to an infected laceration of the right forearm, which he sustained when he fell outdoors a couple days prior.  Reportedly, patient has been having recent falls over the past few days resulting in a few falls.  When he cut his right forearm family washed it out with a garden hose.  Over the course of the last couple days however he has had a change in mentation.  EMS was initially called out for trouble shortness of breath.  In the ED, Tmax 99 tachycardic from 93-109, tachypneic to the 20s with initial BP 79/49,  improving to 101/56 with IV fluids.  O2 sat in the high 90s on room air. Type of Study: Bedside Swallow Evaluation Previous Swallow Assessment: previous in May of 2024- recommended regular solids and thin liquids Diet Prior to this Study: Thin liquids (Level 0);Dysphagia 2 (finely chopped) Temperature Spikes Noted: No Respiratory Status: Room air History of Recent Intubation: No Behavior/Cognition: Alert;Cooperative;Distractible Oral Cavity Assessment: Dry Oral Care Completed by SLP: Yes Oral Cavity - Dentition: Dentures, top;Dentures, bottom Vision: Impaired for self-feeding Self-Feeding Abilities: Needs assist (limited use of dominant upper extremity) Patient Positioning: Upright in bed Baseline Vocal Quality: Normal Volitional Cough: Strong Volitional Swallow: Able to elicit    Oral/Motor/Sensory Function Overall Oral Motor/Sensory Function: Within functional limits   Ice Chips Ice chips: Not tested   Thin Liquid Thin Liquid: Within functional limits Presentation: Straw;Cup    Nectar Thick Nectar Thick Liquid: Not tested   Honey Thick Honey Thick Liquid: Not tested   Puree Puree: Within functional limits Presentation: Spoon   Solid     Solid: Within functional limits Presentation: Spoon     [place] Charie Pinkus Clapp, MS, CCC-SLP  Speech Language Pathologist Rehab Services; [facility] [place] [phone] (ascom)   [place] J Clapp 05/03/2024,9:24 AM

## 2024-05-03 NOTE — Discharge Summary (Signed)
 Marcus Kemp FMW:[id number] DOB: 1925-05-06 DOA: 04/29/2024  PCP: Auston Reyes BIRCH, MD  Admit date: 04/29/2024 Discharge date: 05/03/2024  Time spent: 35 minutes  Recommendations for Outpatient Follow-up:  Pcp f/u 1 week Consider suture removal at f/u (monocryl sutures so will dissolve over time)     Discharge Diagnoses:  Principal Problem:   Severe sepsis (HCC) Active Problems:   Infected laceration of skin, right forearm   Hypotension   (HFpEF) heart failure with preserved ejection fraction (HCC)   Acute metabolic encephalopathy   Longstanding persistent atrial fibrillation (HCC)   Chronic anticoagulation   History of pulmonary embolism   Stage 3a chronic kidney disease (HCC)   Anemia, macrocytic   Thrombocytopenia   Rheumatic mitral regurgitation   Renal cell carcinoma on annual CT surveillance [facility])   Discharge Condition: improved  Diet recommendation: heart healthy  Filed Weights   04/29/24 1641  Weight: 64.7 kg    History of present illness:  From admission h and p Marcus Kemp is a 89 y.o. male with medical history significant for HFpEF, prior PE, persistent A-fib on Eliquis , pulmonary hypertension, renal cell carcinoma, rheumatic heart disease, CKD stage IIIa, hypotension with prior need for midodrine , who is being admitted with sepsis secondary to an infected laceration of the right forearm, which he sustained when he fell outdoors a couple days prior. Reportedly, patient has been having recent falls over the past few days resulting in a few falls.  When he cut his right forearm family washed it out with a garden hose.  Over the course of the last couple days however he has had a change in mentation.  EMS was initially called out for trouble shortness of breath. In the ED, Tmax 99 tachycardic from 93-109, tachypneic to the 20s with initial BP 79/49, improving to 101/56 with IV fluids.  O2 sat in the high 90s on room air. Labs notable for WBC 15,000 and  lactic acid 1.8 Hemoglobin 11 (12.2, 04/11/24) Platelets 104 (125, 04/11/2024)) Creatinine 1.33 (baseline 1.1, 11/2023) INR 1.7 UA unremarkable Respiratory viral panel pending EKG not done CT chest abdomen and pelvis with contrast showing unchanged left kidney mass consistent with RCC.  For the most part nonacute showing small bilateral effusions and interlobular septal thickening consistent with pulmonary edema, cardiomegaly and CAD, pulmonary hypertension with similar findings to CT from a year ago with gallbladder thickening and nonobstructive right nephrolithiasis--please see full report X-ray of the right humerus and forearm without evidence of fracture.   Patient was started on meropenem and vancomycin  and sepsis fluids Patient received a tetanus booster His right forearm wound was irrigated and loosely approximated by ED provider      Hospital Course:   Patient presents with cellulitis from infected laceration of right forearm. Suture repair was performed in the ER by the ER physician. Sepsis by tachycardia and leukocytosis, sepsis now resolved. Treated with broad-spectrum antibiotics eventually narrowed to ceftriaxone  and doxycycline , has responded to antibiotics so will complete a 7 day course of antibiotics with duricef and doxycycline . No cultures were obtained and blood cultures are negative. Patient did developed mild hypoxic respiratory failure secondary to fluid overload and not continuing home diuretic, this resolved with IV furosemide . He is feeling well and eager to discharge. Discharge plan reviewed with family who are in agreement. Advise pcp f/u next week, family was instructed in wound care, and consider removing sutures (they are monocryl but can take a while to disappear). Other chronic conditions stable.  Procedures: Suture repair in the ER   Consultations: none  Discharge Exam: Vitals:   05/03/24 0442 05/03/24 0850  BP: 105/65 105/66  Pulse: (!) 102 (!) 102   Resp: 16 17  Temp: 98.7 F (37.1 C) 98.9 F (37.2 C)  SpO2: 90% 94%    General: NAD Cardiovascular: RRR Respiratory: CTAB Skin: healing laceration right forearm with interval improvement in induration and erythema, no fluctuance  Discharge Instructions   Discharge Instructions     Diet - low sodium heart healthy   Complete by: As directed    Discharge wound care:   Complete by: As directed    Apply vaseline and gentle dressing   Increase activity slowly   Complete by: As directed       Allergies as of 05/03/2024       Reactions   Penicillins Anaphylaxis   Has tolerated ceftriaxone    Sulfa Antibiotics Nausea Only        Medication List     STOP taking these medications    cetirizine 10 MG tablet Commonly known as: ZYRTEC   fluorouracil 5 % cream Commonly known as: EFUDEX   levocetirizine 5 MG tablet Commonly known as: XYZAL   UNABLE TO FIND       TAKE these medications    Azelastine HCl 137 MCG/SPRAY Soln Place 1 spray into the nose 2 (two) times daily as needed.   cefadroxil 500 MG capsule Commonly known as: DURICEF Take 1 capsule (500 mg total) by mouth 2 (two) times daily for 3 days.   cyanocobalamin  500 MCG tablet Commonly known as: VITAMIN B12 Take 1 tablet (500 mcg total) by mouth daily.   doxycycline  100 MG tablet Commonly known as: VIBRA -TABS Take 1 tablet (100 mg total) by mouth 2 (two) times daily for 3 days.   Eliquis  2.5 MG Tabs tablet Generic drug: apixaban  Take 2.5 mg by mouth every 12 (twelve) hours.   Jardiance  10 MG Tabs tablet Generic drug: empagliflozin  TAKE 1 TABLET BY MOUTH EVERY DAY   midodrine  5 MG tablet Commonly known as: PROAMATINE  Take 1 tablet (5 mg total) by mouth 2 (two) times daily with a meal. For SBP <100 What changed:  when to take this reasons to take this   mirtazapine 7.5 MG tablet Commonly known as: REMERON Take 7.5 mg by mouth at bedtime.   rOPINIRole  1 MG tablet Commonly known as:  REQUIP  Take 1 mg by mouth at bedtime.   torsemide  20 MG tablet Commonly known as: DEMADEX  TAKE 1 TABLET (20 MG TOTAL) BY MOUTH 2 (TWO) TIMES DAILY. 20MG  IN THE MORNING AND 20MG  IN THE EVENING What changed:  when to take this additional instructions   traZODone  100 MG tablet Commonly known as: DESYREL  Take 100 mg by mouth at bedtime.               Discharge Care Instructions  (From admission, onward)           Start     Ordered   05/03/24 0000  Discharge wound care:       Comments: Apply vaseline and gentle dressing   05/03/24 1336           Allergies  Allergen Reactions   Penicillins Anaphylaxis    Has tolerated ceftriaxone    Sulfa Antibiotics Nausea Only    Follow-up Information     Auston Reyes BIRCH, MD Follow up.   Specialty: Internal Medicine Why: one week Contact information: [address] [facility]  [place] [address] [phone]                  The results of significant diagnostics from this hospitalization (including imaging, microbiology, ancillary and laboratory) are listed below for reference.    Significant Diagnostic Studies: DG Chest Port 1 View Result Date: 05/02/2024 CLINICAL DATA:  Hypoxia. EXAM: PORTABLE CHEST 1 VIEW COMPARISON:  Dec 05, 2022. FINDINGS: Mild cardiomegaly is noted with central pulmonary vascular congestion. Probable mild bibasilar pulmonary edema is noted with associated pleural effusions and atelectasis. Bony thorax is unremarkable. IMPRESSION: Mild cardiomegaly with central pulmonary vascular congestion. Probable mild bibasilar pulmonary edema with associated pleural effusions and atelectasis. Electronically Signed   By: Lynwood Landy Raddle M.D.   On: 05/02/2024 17:42   CT CHEST ABDOMEN PELVIS W CONTRAST Result Date: 04/29/2024 CLINICAL DATA:  Sepsis, left renal cell carcinoma * Tracking Code: BO * EXAM: CT CHEST, ABDOMEN, AND PELVIS WITH CONTRAST TECHNIQUE: Multidetector CT imaging of the  chest, abdomen and pelvis was performed following the standard protocol during bolus administration of intravenous contrast. RADIATION DOSE REDUCTION: This exam was performed according to the departmental dose-optimization program which includes automated exposure control, adjustment of the mA and/or kV according to patient size and/or use of iterative reconstruction technique. CONTRAST:  OMNIPAQUE  IOHEXOL  300 MG/ML  SOLN COMPARISON:  CT abdomen pelvis, 12/30/2022 FINDINGS: CT CHEST FINDINGS Cardiovascular: Aortic atherosclerosis. Cardiomegaly. Three-vessel coronary artery calcifications. Enlargement of the main pulmonary artery measuring up to 3.7 cm in caliber. No pericardial effusion. Mediastinum/Nodes: No enlarged mediastinal, hilar, or axillary lymph nodes. Thyroid gland, trachea, and esophagus demonstrate no significant findings. Lungs/Pleura: Small bilateral pleural effusions and associated atelectasis or consolidation. Interlobular septal thickening throughout consistent with pulmonary edema. Musculoskeletal: No chest wall abnormality. No acute osseous findings. CT ABDOMEN PELVIS FINDINGS Hepatobiliary: No solid liver abnormality is seen. Mild gallbladder wall thickening. No visible gallstones. Intra and extrahepatic biliary ductal dilatation, common bile duct measuring up to 0.9 cm in caliber and appearing to taper to the ampulla without calculus or other obstruction visible. Appearance is similar to prior examination dated 12/30/2022. Pancreas: Unremarkable. No pancreatic ductal dilatation or surrounding inflammatory changes. Spleen: Normal in size without significant abnormality. Adrenals/Urinary Tract: Adrenal glands are unremarkable. Unchanged heterogeneously enhancing mass arising from the posterior midportion of the left kidney measuring 2.1 x 1.8 cm (series 2, image 62). Nonobstructive right renal calculi. No ureteral calculi or hydronephrosis. Bladder is unremarkable. Stomach/Bowel: Stomach is  within normal limits. Appendix appears normal. No evidence of bowel wall thickening, distention, or inflammatory changes. Sigmoid diverticulosis. Vascular/Lymphatic: Severe aortic atherosclerosis. No enlarged abdominal or pelvic lymph nodes. Reproductive: No mass or other abnormality. Other: No abdominal wall hernia or abnormality. No ascites. Musculoskeletal: No acute osseous findings. Disc degenerative disease and bridging osteophytosis throughout the thoracic and lumbar spine, in keeping with DISH. IMPRESSION: 1. Small bilateral pleural effusions and associated atelectasis or consolidation. Interlobular septal thickening throughout consistent with pulmonary edema. 2. Cardiomegaly and coronary artery disease. Enlargement of the main pulmonary artery, as can be seen in pulmonary hypertension. 3. Mild gallbladder wall thickening. No visible gallstones. Intra and extrahepatic biliary ductal dilatation, common bile duct measuring up to 0.9 cm in caliber and appearing to taper to the ampulla without calculus or other obstruction visible. Appearance is similar to prior examination dated 12/30/2022. Correlate with biochemical findings and consider ultrasound or MRCP to further evaluate if indicated by clinical concern for cholecystitis. 4. Unchanged heterogeneously enhancing mass arising from the posterior midportion of  the left kidney measuring 2.1 x 1.8 cm, consistent with renal cell carcinoma. 5. No evidence of lymphadenopathy or metastatic disease in the chest, abdomen, or pelvis. Particularly, a previously seen enlarged, hypodense aortocaval lymph node is resolved on today's examination. 6. Nonobstructive right nephrolithiasis.  No hydronephrosis. Aortic Atherosclerosis (ICD10-I70.0). Electronically Signed   By: Marolyn [name] Jaksch M.D.   On: 04/29/2024 20:44   DG Forearm Right Result Date: 04/29/2024 CLINICAL DATA:  right upper arm pain and right forearm pain after fall EXAM: RIGHT FOREARM - 2 VIEW COMPARISON:  None  Available. FINDINGS: Diffuse osteopenia.No acute fracture or dislocation. There is no evidence of arthropathy or other focal bone abnormality. Gauze material overlying the proximal aspect of the dorsal forearm. IMPRESSION: Diffuse osteopenia. No acute fracture or malalignment of the radius and ulna. Electronically Signed   By: Rogelia Myers M.D.   On: 04/29/2024 17:25   DG Humerus Right Result Date: 04/29/2024 CLINICAL DATA:  right upper arm pain and right forearm pain after fall EXAM: RIGHT HUMERUS - 2+ VIEW COMPARISON:  None Available. FINDINGS: Osteopenia.No acute fracture or dislocation. There is no evidence of arthropathy or other focal bone abnormality. Soft tissues are unremarkable. IMPRESSION: Diffuse osteopenia.  No acute fracture or dislocation. Electronically Signed   By: Rogelia Myers M.D.   On: 04/29/2024 17:24    Microbiology: Recent Results (from the past 240 hours)  Culture, blood (Routine x 2)     Status: None (Preliminary result)   Collection Time: 04/29/24  4:52 PM   Specimen: BLOOD LEFT ARM  Result Value Ref Range Status   Specimen Description BLOOD LEFT ARM  Final   Special Requests   Final    BOTTLES DRAWN AEROBIC AND ANAEROBIC Blood Culture results may not be optimal due to an inadequate volume of blood received in culture bottles   Culture   Final    NO GROWTH 4 DAYS Performed at [facility], [address], [place], [place] [address]    Report Status PENDING  Incomplete  Culture, blood (Routine x 2)     Status: None (Preliminary result)   Collection Time: 04/29/24  4:52 PM   Specimen: BLOOD  Result Value Ref Range Status   Specimen Description BLOOD LEFT ANTECUBITAL  Final   Special Requests   Final    BOTTLES DRAWN AEROBIC AND ANAEROBIC Blood Culture results may not be optimal due to an inadequate volume of blood received in culture bottles   Culture   Final    NO GROWTH 4 DAYS Performed at [facility], [address], [place],  [place] [address]    Report Status PENDING  Incomplete  Resp panel by RT-PCR (RSV, Flu A&B, Covid) Anterior Nasal Swab     Status: None   Collection Time: 04/29/24  9:09 PM   Specimen: Anterior Nasal Swab  Result Value Ref Range Status   SARS Coronavirus 2 by RT PCR NEGATIVE NEGATIVE Final    Comment: (NOTE) SARS-CoV-2 target nucleic acids are NOT DETECTED.  The SARS-CoV-2 RNA is generally detectable in upper respiratory specimens during the acute phase of infection. The lowest concentration of SARS-CoV-2 viral copies this assay can detect is 138 copies/mL. A negative result does not preclude SARS-Cov-2 infection and should not be used as the sole basis for treatment or other patient management decisions. A negative result may occur with  improper specimen collection/handling, submission of specimen other than nasopharyngeal swab, presence of viral mutation(s) within the areas targeted by this assay, and  inadequate number of viral copies(<138 copies/mL). A negative result must be combined with clinical observations, patient history, and epidemiological information. The expected result is Negative.  Fact Sheet for Patients:  BloggerCourse.com  Fact Sheet for Healthcare Providers:  SeriousBroker.it  This test is no t yet approved or cleared by the [place]  FDA and  has been authorized for detection and/or diagnosis of SARS-CoV-2 by FDA under an Emergency Use Authorization (EUA). This EUA will remain  in effect (meaning this test can be used) for the duration of the COVID-19 declaration under Section 564(b)(1) of the Act, 21 U.S.C.section 360bbb-3(b)(1), unless the authorization is terminated  or revoked sooner.       Influenza A by PCR NEGATIVE NEGATIVE Final   Influenza B by PCR NEGATIVE NEGATIVE Final    Comment: (NOTE) The Xpert Xpress SARS-CoV-2/FLU/RSV plus assay is intended as an aid in the diagnosis of influenza from  Nasopharyngeal swab specimens and should not be used as a sole basis for treatment. Nasal washings and aspirates are unacceptable for Xpert Xpress SARS-CoV-2/FLU/RSV testing.  Fact Sheet for Patients: BloggerCourse.com  Fact Sheet for Healthcare Providers: SeriousBroker.it  This test is not yet approved or cleared by the [place]  FDA and has been authorized for detection and/or diagnosis of SARS-CoV-2 by FDA under an Emergency Use Authorization (EUA). This EUA will remain in effect (meaning this test can be used) for the duration of the COVID-19 declaration under Section 564(b)(1) of the Act, 21 U.S.C. section 360bbb-3(b)(1), unless the authorization is terminated or revoked.     Resp Syncytial Virus by PCR NEGATIVE NEGATIVE Final    Comment: (NOTE) Fact Sheet for Patients: BloggerCourse.com  Fact Sheet for Healthcare Providers: SeriousBroker.it  This test is not yet approved or cleared by the [place]  FDA and has been authorized for detection and/or diagnosis of SARS-CoV-2 by FDA under an Emergency Use Authorization (EUA). This EUA will remain in effect (meaning this test can be used) for the duration of the COVID-19 declaration under Section 564(b)(1) of the Act, 21 U.S.C. section 360bbb-3(b)(1), unless the authorization is terminated or revoked.  Performed at [facility], [address] [place], [place] [address]      Labs: Basic Metabolic Panel: Recent Labs  Lab 04/29/24 1652 04/30/24 0553 05/01/24 0550 05/01/24 0858 05/03/24 0532  NA 135 135  --  138 138  K 4.9 3.6  --  4.1 3.6  CL 99 102  --  102 101  CO2 24 23  --  22 24  GLUCOSE 103* 90  --  97 90  BUN 29* 26*  --  26* 27*  CREATININE 1.33* 1.10 1.05 1.04 1.08  CALCIUM 8.4* 8.3*  --  8.7* 8.1*   Liver Function Tests: Recent Labs  Lab 04/29/24 1652 04/30/24 0553  AST 40 28  ALT  16 14  ALKPHOS 80 66  BILITOT 3.2* 3.7*  PROT 6.2* 5.7*  ALBUMIN 2.8* 2.7*   No results for input(s): LIPASE, AMYLASE in the last 168 hours. No results for input(s): AMMONIA in the last 168 hours. CBC: Recent Labs  Lab 04/29/24 1652 04/30/24 0553 05/01/24 0858  WBC 15.5* 11.4* 8.9  NEUTROABS 13.1*  --  6.1  HGB 11.0* 10.4* 10.6*  HCT 32.9* 31.8* 32.8*  MCV 107.9* 110.4* 109.7*  PLT 104* 93* 98*   Cardiac Enzymes: No results for input(s): CKTOTAL, CKMB, CKMBINDEX, TROPONINI in the last 168 hours. BNP: BNP (last 3 results) Recent Labs    05/02/24 1524  BNP 591.1*    ProBNP (last 3 results) No results for input(s): PROBNP in the last 8760 hours.  CBG: No results for input(s): GLUCAP in the last 168 hours.     Signed:  Devaughn KATHEE Ban MD.  [facility] 05/03/2024, 1:37 PM

## 2024-05-04 LAB — CULTURE, BLOOD (ROUTINE X 2)
Culture: NO GROWTH
Culture: NO GROWTH

## 2024-05-17 IMAGING — CT CT ABD-PEL WO/W CM
2 of 13 series · 10 of 46 positions shown, 16 images · IV contrast (agent unspecified)
Comparison: Multiple priors including most recent CT May 05, 2021.

CLINICAL DATA: Follow-up enhancing left renal lesion. * Tracking
Code: BO *

EXAM:
CT ABDOMEN AND PELVIS WITHOUT AND WITH CONTRAST
TECHNIQUE: Multidetector CT imaging of the abdomen and pelvis was performed
following the standard protocol before and following the bolus
administration of intravenous contrast.

[Series 2: axial without renal without 2.00 · axial · non-contrast · 0.82mm/px · z∈[-1481,-1129]mm · 8 of 228 slices shown, 13 images]
[im 26/228  soft-tissue]
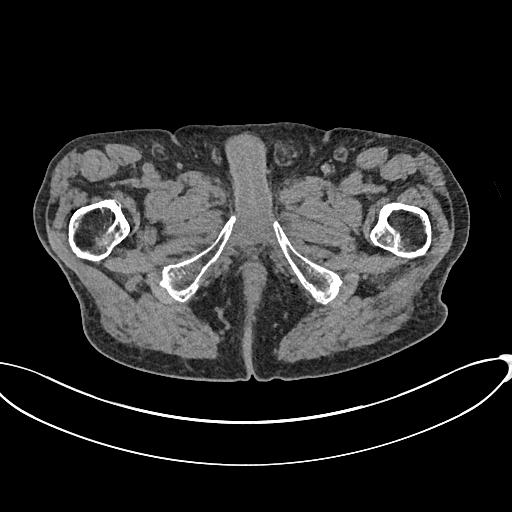
[im 26/228  bone]
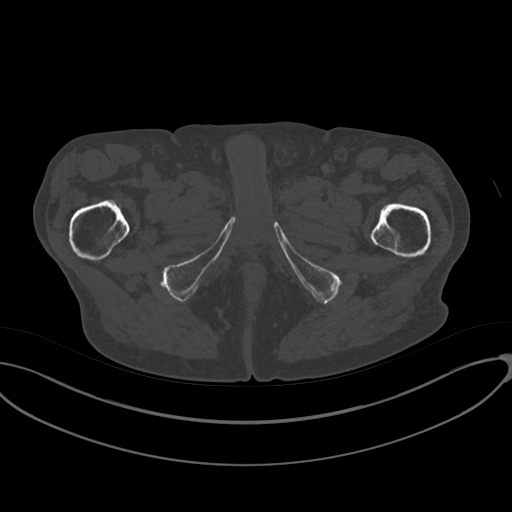
[im 51/228  soft-tissue]
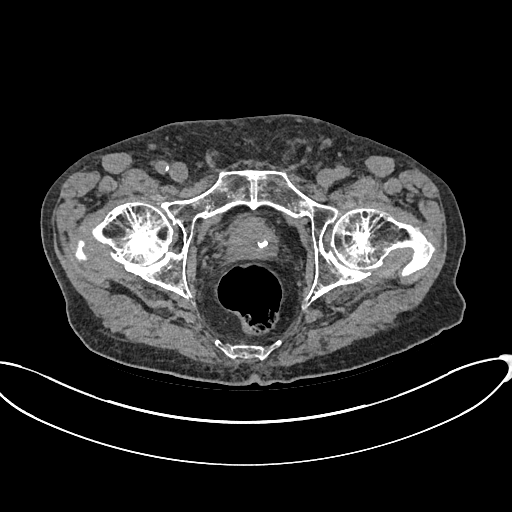
[im 76/228  soft-tissue]
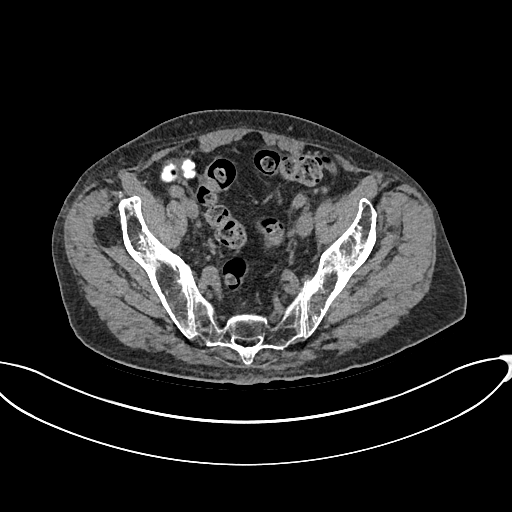
[im 101/228  soft-tissue]
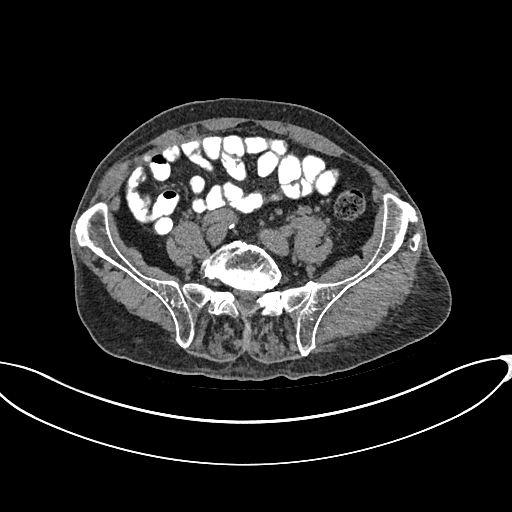
[im 127/228  soft-tissue]
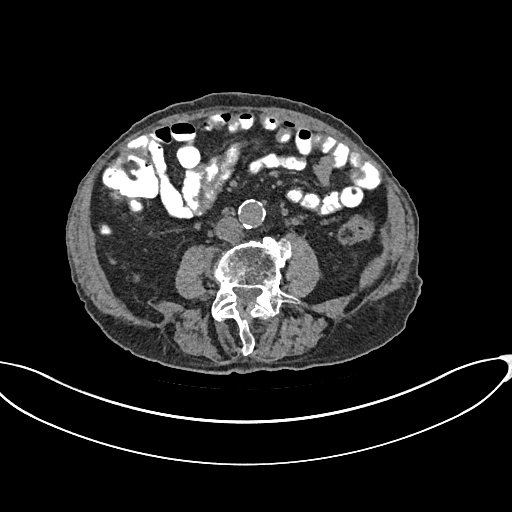
[im 127/228  lung]
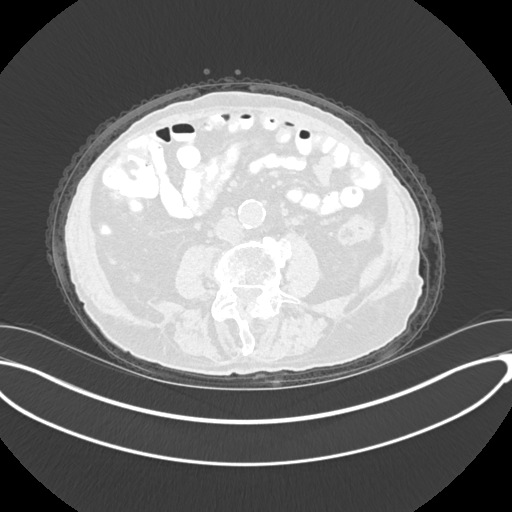
[im 152/228  soft-tissue]
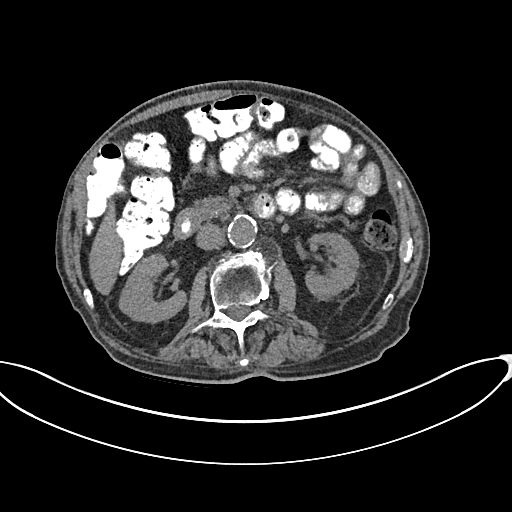
[im 152/228  lung]
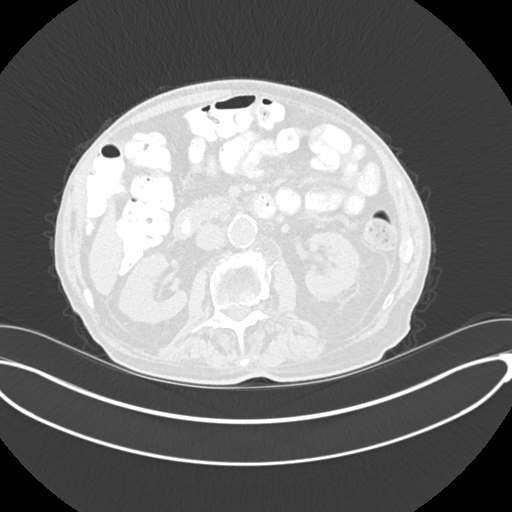
[im 177/228  soft-tissue]
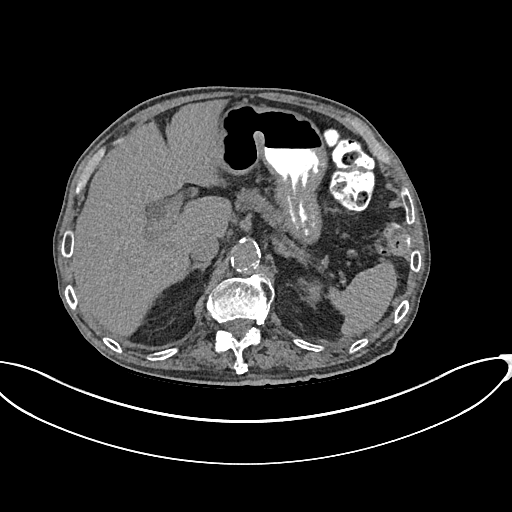
[im 177/228  lung]
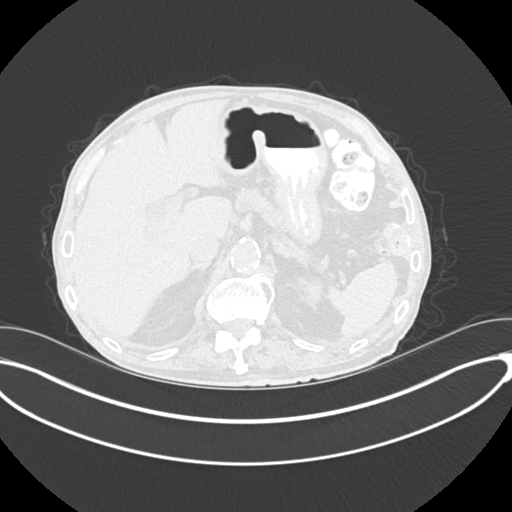
[im 202/228  soft-tissue]
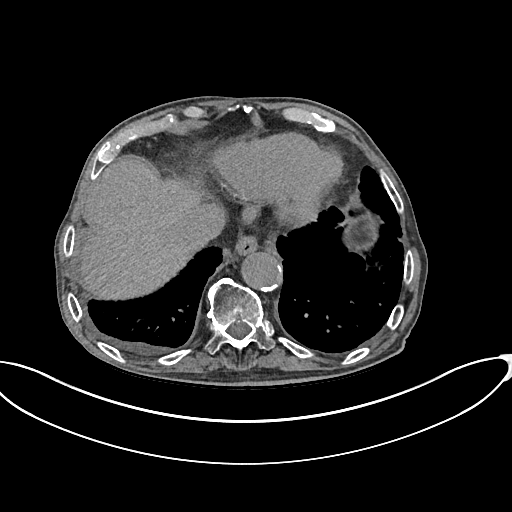
[im 202/228  lung]
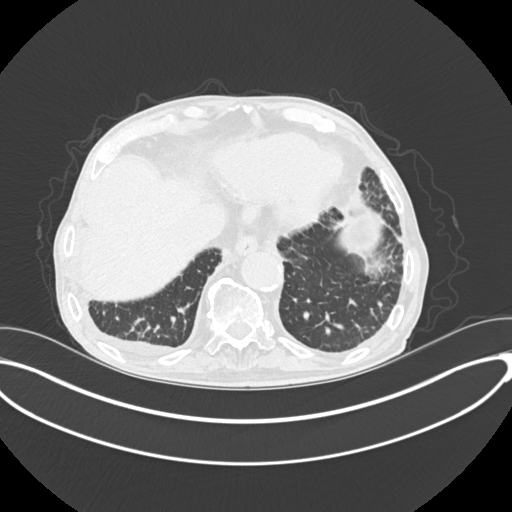

[Series 18: coronal nephrographic renal nephrographic 2.00 cor · coronal · 0.82mm/px · 2 of 207 slices shown, 3 images]
[im 69/207  soft-tissue]
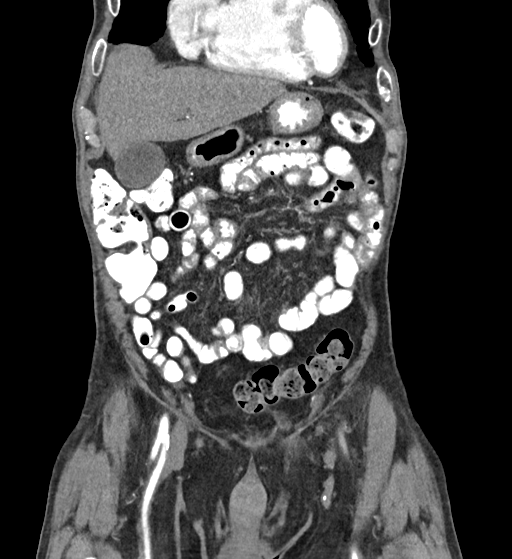
[im 69/207  bone]
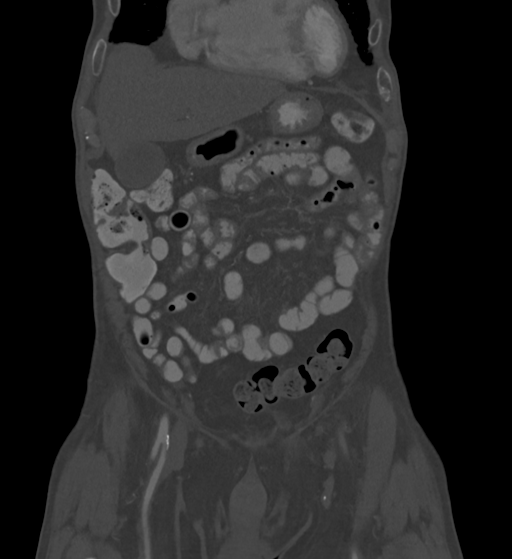
[im 138/207  soft-tissue]
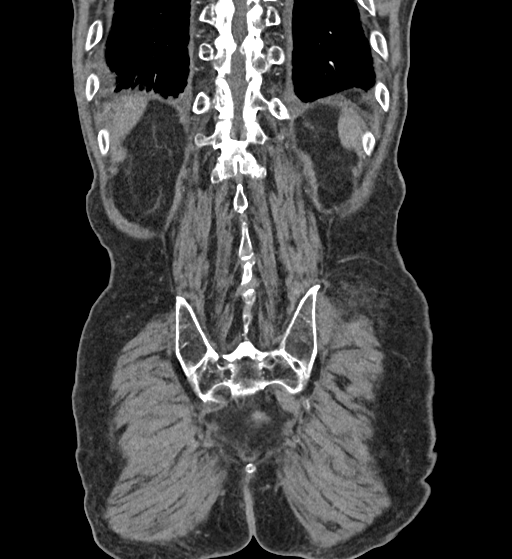

[10 of 46 positions shown; findings below may reference images not displayed]

RADIATION DOSE REDUCTION: This exam was performed according to the
departmental dose-optimization program which includes automated
exposure control, adjustment of the mA and/or kV according to
patient size and/or use of iterative reconstruction technique.

CONTRAST:  80mL OMNIPAQUE IOHEXOL 300 MG/ML  SOLN
FINDINGS: Lower chest: Cardiomegaly. Trace right-greater-than-left pleural
effusions with bibasilar interlobular septal thickening.

Hepatobiliary: Findings again suggestive of congestive hepatopathy.
Slight contour nodularity with relative hypertrophy of the caudate
lobe and lateral left lobe of the liver, similar prior. No
suspicious hepatic lesion. Cholelithiasis without findings of acute
cholecystitis. No biliary ductal dilation.

Pancreas: No pancreatic ductal dilation or evidence of acute
inflammation.

Spleen: No splenomegaly or focal splenic lesion.

Adrenals/Urinary Tract: Bilateral adrenal glands appear normal.

Multiple bilateral renal cysts and subcentimeter hypodense renal
lesions which are technically too small to accurately characterize
but statistically likely to reflect cysts are stable from prior.

Unchanged size of the 1.9 x 1.7 cm solid enhancing left interpolar
renal lesion on image 68/16.

No hydronephrosis. Nonobstructive bilateral renal stones. Kidneys
demonstrate symmetric enhancement and excretion of contrast
material.

Mild symmetric wall thickening of an incompletely distended urinary
bladder.

Stomach/Bowel: Radiopaque enteric contrast material traverses the
splenic flexure. Reflux versus retained contrast in the esophagus.
Stomach is unremarkable for degree of distension. No pathologic
dilation of small or large bowel. The appendix and terminal ileum
appear normal. Moderate volume of formed stool throughout the colon
suggestive of constipation. Sigmoid colonic diverticulosis without
findings of acute diverticulitis.

Vascular/Lymphatic: Aortic atherosclerosis with aneurysmal dilation
of the infrarenal abdominal aorta measuring 3 cm, unchanged.
Evidence of renal tumor in vein. No pathologically enlarged
abdominal or pelvic lymph nodes.

Reproductive: Prostate is unremarkable.

Other: Fat and fluid containing left inguinal hernia. Trace
abdominopelvic free fluid.

Musculoskeletal: Diffuse demineralization of bone. Aggressive lytic
or blastic lesion of bone. Multilevel degenerative changes of the
spine. Degenerative changes of the bilateral hips.
IMPRESSION: 1. Stable solid enhancing 1.9 cm left interpolar renal lesion most
consistent with renal cell carcinoma.
2. No evidence of renal tumor in vein or abdominopelvic metastatic
disease.
3. Stable benign bilateral renal cysts and subcentimeter renal
lesions technically too small to accurately characterize but
statistically likely to reflect cysts.
4. Congestive hepatopathy with morphologic hepatic changes
suggestive of cirrhosis.
5. Cardiac enlargement with mild pulmonary edema.
6. Infrarenal abdominal aortic aneurysm measuring 3 cm, unchanged.
Attention on active oncologic surveillance imaging in this patient
is suggested. Reference: [HOSPITAL] [id number];[DATE].
7.  Aortic Atherosclerosis ([id number]).
